# Patient Record
Sex: Female | Born: 1970
Health system: Southern US, Community
[De-identification: ages and names within clinical notes are randomized; demographics above are authoritative.]

## PROBLEM LIST (undated history)

## (undated) DIAGNOSIS — N200 Calculus of kidney: Secondary | ICD-10-CM

## (undated) DIAGNOSIS — E079 Disorder of thyroid, unspecified: Secondary | ICD-10-CM

## (undated) DIAGNOSIS — G4700 Insomnia, unspecified: Secondary | ICD-10-CM

## (undated) DIAGNOSIS — I10 Essential (primary) hypertension: Secondary | ICD-10-CM

## (undated) DIAGNOSIS — F419 Anxiety disorder, unspecified: Secondary | ICD-10-CM

## (undated) DIAGNOSIS — G43909 Migraine, unspecified, not intractable, without status migrainosus: Secondary | ICD-10-CM

## (undated) DIAGNOSIS — G40909 Epilepsy, unspecified, not intractable, without status epilepticus: Secondary | ICD-10-CM

## (undated) DIAGNOSIS — F418 Other specified anxiety disorders: Secondary | ICD-10-CM

## (undated) DIAGNOSIS — C9000 Multiple myeloma not having achieved remission: Secondary | ICD-10-CM

## (undated) DIAGNOSIS — N289 Disorder of kidney and ureter, unspecified: Secondary | ICD-10-CM

## (undated) DIAGNOSIS — M797 Fibromyalgia: Secondary | ICD-10-CM

## (undated) DIAGNOSIS — D75A Glucose-6-phosphate dehydrogenase (G6PD) deficiency without anemia: Secondary | ICD-10-CM

## (undated) DIAGNOSIS — K219 Gastro-esophageal reflux disease without esophagitis: Secondary | ICD-10-CM

## (undated) DIAGNOSIS — E785 Hyperlipidemia, unspecified: Secondary | ICD-10-CM

## (undated) DIAGNOSIS — R569 Unspecified convulsions: Secondary | ICD-10-CM

## (undated) DIAGNOSIS — L509 Urticaria, unspecified: Secondary | ICD-10-CM

## (undated) HISTORY — DX: Glucose-6-phosphate dehydrogenase (G6PD) deficiency without anemia: D75.A

## (undated) HISTORY — DX: Unspecified convulsions: R56.9

## (undated) HISTORY — DX: Other specified anxiety disorders: F41.8

## (undated) HISTORY — DX: Anxiety disorder, unspecified: F41.9

## (undated) HISTORY — DX: Migraine, unspecified, not intractable, without status migrainosus: G43.909

## (undated) HISTORY — PX: TOE SURGERY: SHX1073

## (undated) HISTORY — PX: APPENDECTOMY: SHX54

## (undated) HISTORY — DX: Urticaria, unspecified: L50.9

## (undated) HISTORY — DX: Gastro-esophageal reflux disease without esophagitis: K21.9

## (undated) HISTORY — DX: Fibromyalgia: M79.7

## (undated) HISTORY — DX: Disorder of kidney and ureter, unspecified: N28.9

## (undated) HISTORY — PX: TONSILLECTOMY AND ADENOIDECTOMY: SUR1326

## (undated) HISTORY — DX: Epilepsy, unspecified, not intractable, without status epilepticus: G40.909

## (undated) HISTORY — DX: Calculus of kidney: N20.0

## (undated) HISTORY — DX: Insomnia, unspecified: G47.00

## (undated) HISTORY — DX: Disorder of thyroid, unspecified: E07.9

## (undated) HISTORY — PX: CHOLECYSTECTOMY: SHX55

## (undated) HISTORY — DX: Essential (primary) hypertension: I10

## (undated) HISTORY — DX: Hyperlipidemia, unspecified: E78.5

## (undated) HISTORY — DX: Multiple myeloma not having achieved remission: C90.00

## (undated) HISTORY — PX: ABDOMINAL HYSTERECTOMY: SHX81

---

## 1999-02-26 ENCOUNTER — Inpatient Hospital Stay (HOSPITAL_COMMUNITY): Admission: AD | Admit: 1999-02-26 | Discharge: 1999-03-09 | Payer: Self-pay | Admitting: *Deleted

## 1999-03-03 ENCOUNTER — Encounter: Payer: Self-pay | Admitting: Family Medicine

## 1999-03-05 ENCOUNTER — Encounter: Payer: Self-pay | Admitting: *Deleted

## 2000-02-03 ENCOUNTER — Emergency Department (HOSPITAL_COMMUNITY): Admission: EM | Admit: 2000-02-03 | Discharge: 2000-02-03 | Payer: Self-pay | Admitting: *Deleted

## 2000-02-03 ENCOUNTER — Encounter: Payer: Self-pay | Admitting: *Deleted

## 2002-06-13 ENCOUNTER — Emergency Department (HOSPITAL_COMMUNITY): Admission: EM | Admit: 2002-06-13 | Discharge: 2002-06-13 | Payer: Self-pay

## 2002-06-14 ENCOUNTER — Ambulatory Visit (HOSPITAL_COMMUNITY): Admission: RE | Admit: 2002-06-14 | Discharge: 2002-06-14 | Payer: Self-pay | Admitting: *Deleted

## 2005-05-09 ENCOUNTER — Inpatient Hospital Stay (HOSPITAL_COMMUNITY): Admission: EM | Admit: 2005-05-09 | Discharge: 2005-05-13 | Payer: Self-pay | Admitting: Emergency Medicine

## 2005-05-09 ENCOUNTER — Encounter: Payer: Self-pay | Admitting: *Deleted

## 2005-05-09 IMAGING — CT CT ABDOMEN W/ CM
1 of 4 series · 13 of 32 positions shown, 19 images · IV contrast (omnipaque)
Comparison: none

CLINICAL DATA: Abdominal and pelvic pain.   Hysterectomy on [DATE].  Patient with history of appendectomy and cholecystectomy.
ABDOMEN CT WITH CONTRAST:
TECHNIQUE: Multidetector CT imaging of the abdomen was performed following the standard protocol during bolus administration of intravenous contrast.
Contrast:  100 cc Omnipaque 300.
No comparison films available.
TECHNIQUE: Multidetector CT imaging of the pelvis was performed following the standard protocol during bolus administration of intravenous contrast.

[Series 2: abd pelvis · axial · 0.71mm/px · z∈[-428,-28]mm · 13 of 90 slices shown, 19 images]
[im 6/90  soft-tissue]
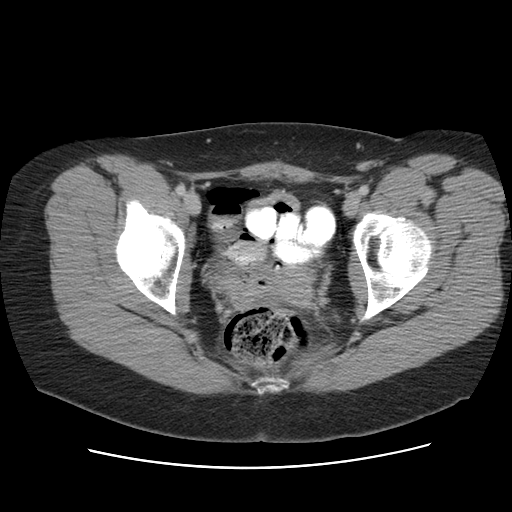
[im 6/90  bone]
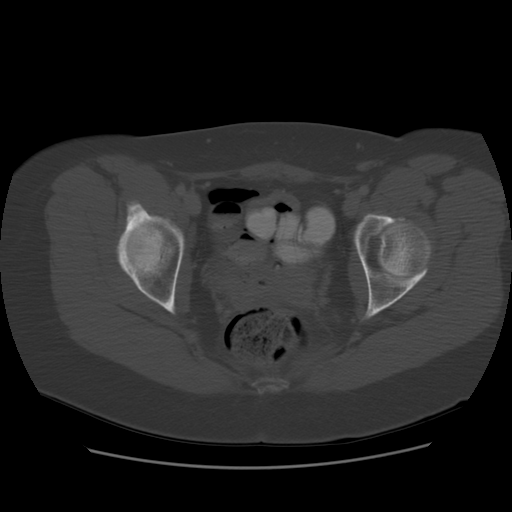
[im 12/90  soft-tissue]
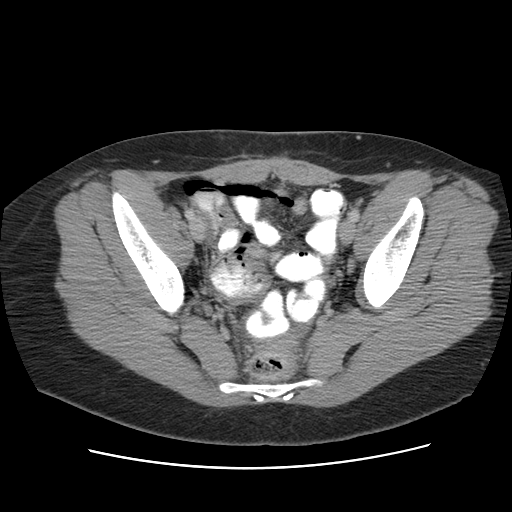
[im 18/90  soft-tissue]
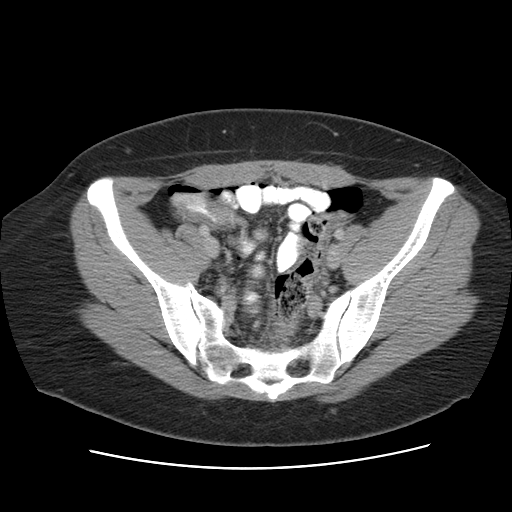
[im 24/90  soft-tissue]
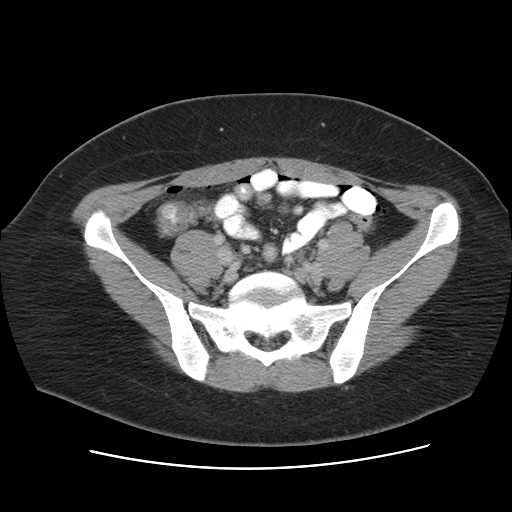
[im 30/90  soft-tissue]
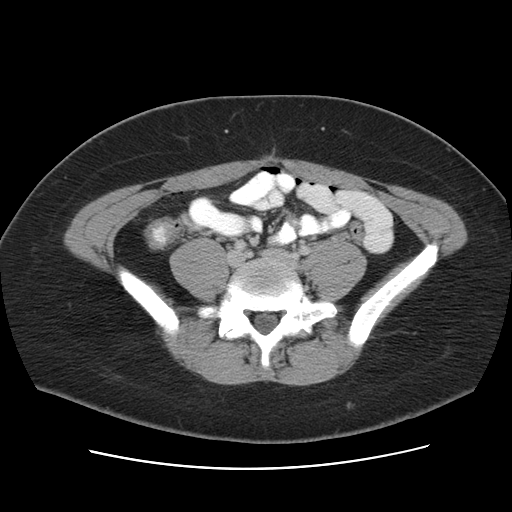
[im 36/90  soft-tissue]
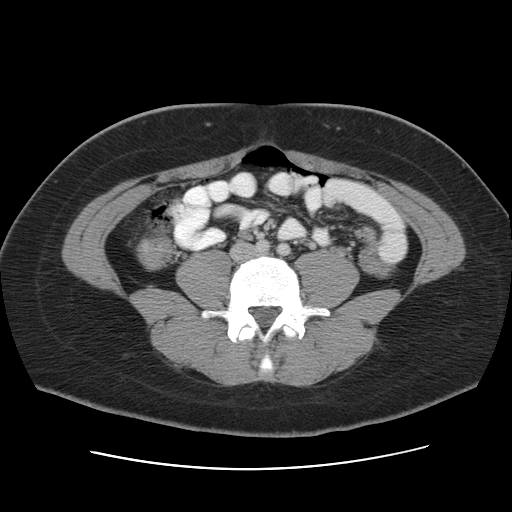
[im 48/90  soft-tissue]
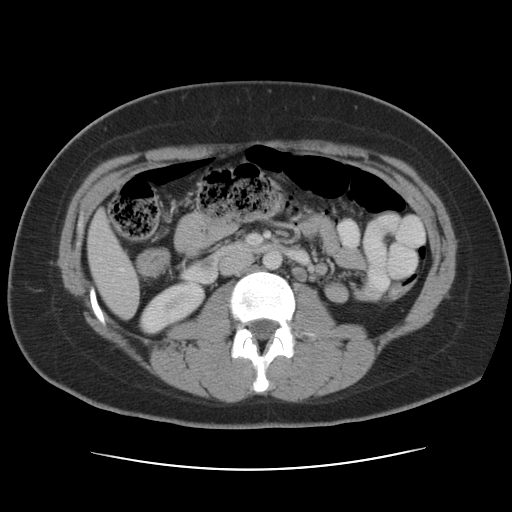
[im 54/90  soft-tissue]
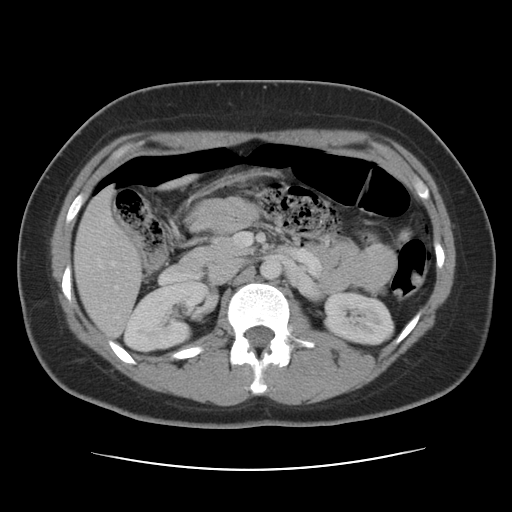
[im 60/90  soft-tissue]
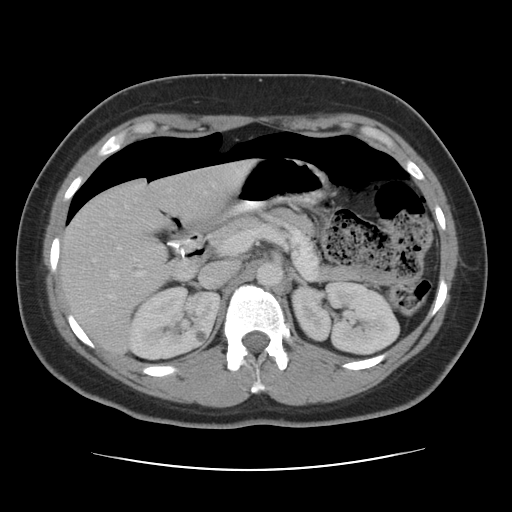
[im 60/90  bone]
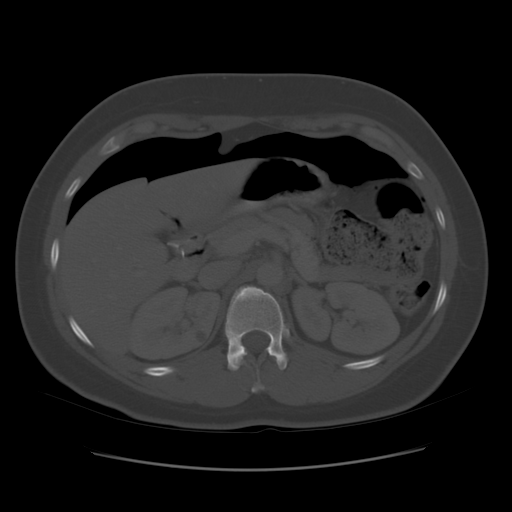
[im 66/90  soft-tissue]
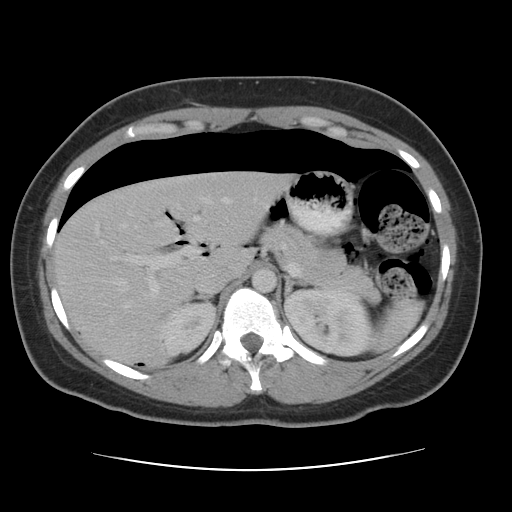
[im 66/90  lung]
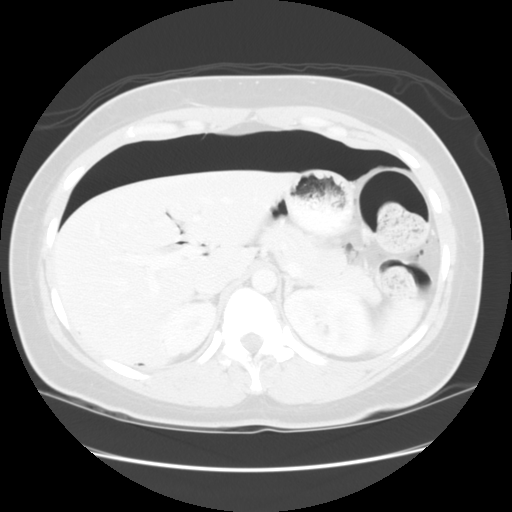
[im 72/90  soft-tissue]
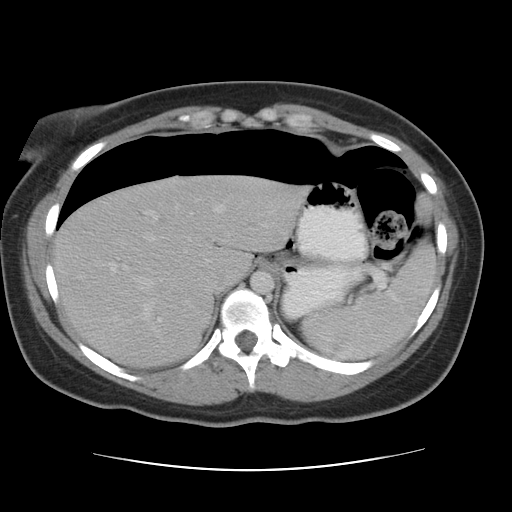
[im 72/90  lung]
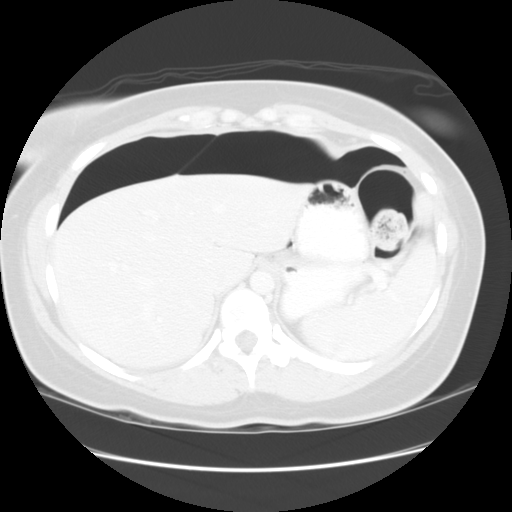
[im 78/90  soft-tissue]
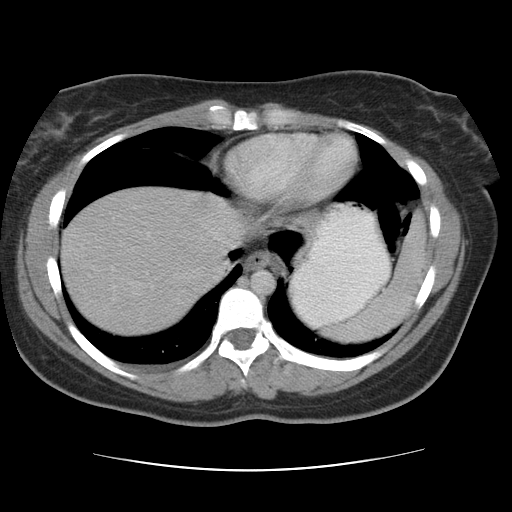
[im 78/90  lung]
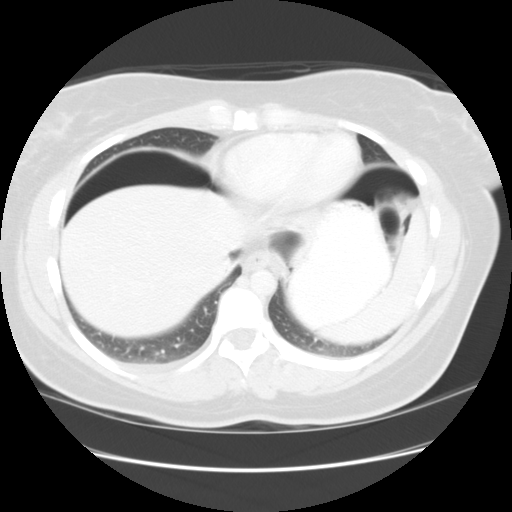
[im 84/90  soft-tissue]
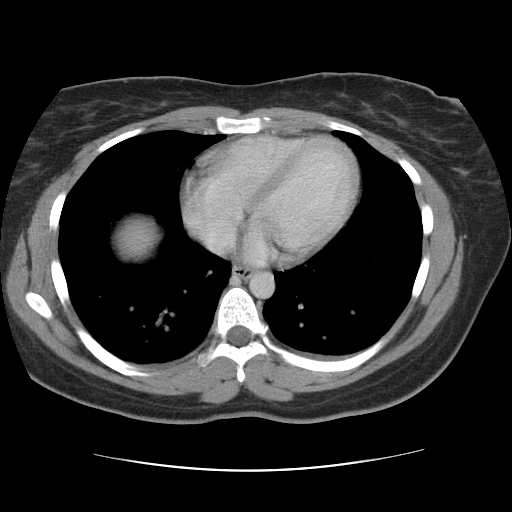
[im 84/90  lung]
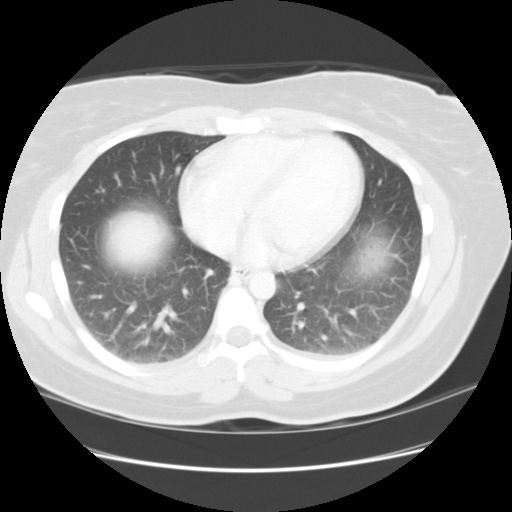

[13 of 32 positions shown; findings below may reference images not displayed]

FINDINGS: Tiny bilateral pleural effusions are noted.  A large amount of pneumoperitoneum is identified compatible with bowel perforation which is located in the low pelvis to be described.  The patient is status post hysterectomy.  The liver, pancreas, adrenal glands, kidneys, and spleen are unremarkable except for small bilateral renal cysts.  No evidence of enlarged lymph nodes or free fluid.  No biliary dilatation is identified.
IMPRESSION: Large amount of pneumoperitoneum compatible with bowel perforation which originates in the low right pelvis.
PELVIS CT WITH CONTRAST:
FINDINGS: Inflammatory changes and free air is compatible with bowel perforation which originate in the low right pelvis, probably from the cecum.  The patient is status post hysterectomy.  A small amount of free fluid is identified.
IMPRESSION: 1.  Pneumoperitoneum compatible with bowel perforation which originates in the low right pelvis - likely from the cecum.  
2.  Status post hysterectomy.
GASA, Nurse Practitioner, was notified of these results on [DATE] at 10 p.m.

## 2006-09-01 ENCOUNTER — Ambulatory Visit: Payer: Self-pay | Admitting: Internal Medicine

## 2006-09-03 ENCOUNTER — Ambulatory Visit: Payer: Self-pay | Admitting: Internal Medicine

## 2006-10-06 ENCOUNTER — Ambulatory Visit: Payer: Self-pay | Admitting: Internal Medicine

## 2006-12-15 ENCOUNTER — Ambulatory Visit: Payer: Self-pay | Admitting: *Deleted

## 2006-12-15 ENCOUNTER — Inpatient Hospital Stay (HOSPITAL_COMMUNITY): Admission: EM | Admit: 2006-12-15 | Discharge: 2006-12-16 | Payer: Self-pay | Admitting: Pediatrics

## 2006-12-16 ENCOUNTER — Encounter: Payer: Self-pay | Admitting: Cardiology

## 2007-01-13 ENCOUNTER — Ambulatory Visit: Payer: Self-pay | Admitting: Internal Medicine

## 2008-10-21 ENCOUNTER — Emergency Department (HOSPITAL_COMMUNITY): Admission: EM | Admit: 2008-10-21 | Discharge: 2008-10-21 | Payer: Self-pay | Admitting: Emergency Medicine

## 2008-10-21 IMAGING — CT CT HEAD W/O CM
1 series · 16 of 30 positions shown, 20 images · non-contrast
Comparison: None

CLINICAL DATA: Syncope.  Headache.

CT HEAD WITHOUT CONTRAST
TECHNIQUE: Contiguous axial images were obtained from the base of
the skull through the vertex without contrast.

[Series 2: head trauma 4.8 h37s · axial · 0.43mm/px · z∈[-102,+26]mm · 16 of 30 slices shown, 20 images]
[im 2/30  brain]
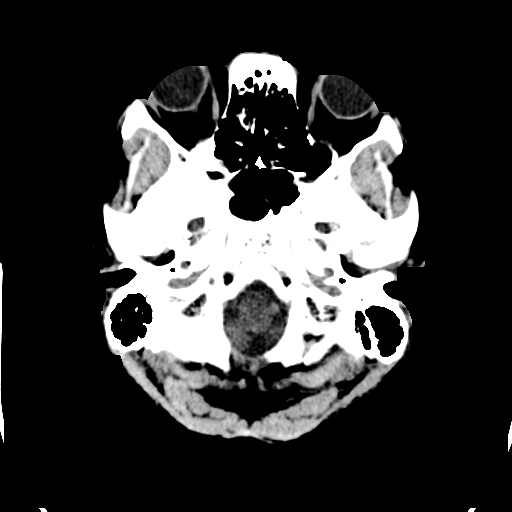
[im 2/30  bone]
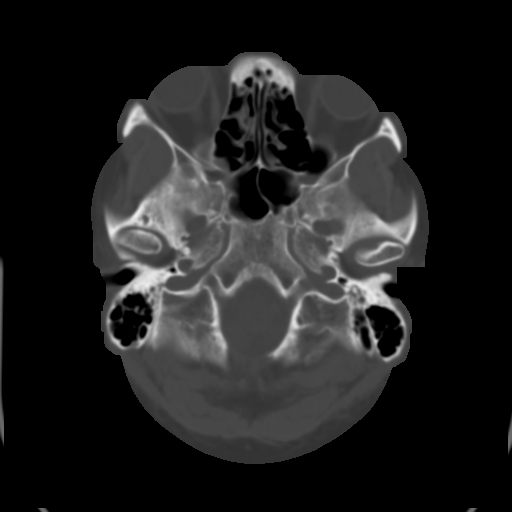
[im 4/30  brain]
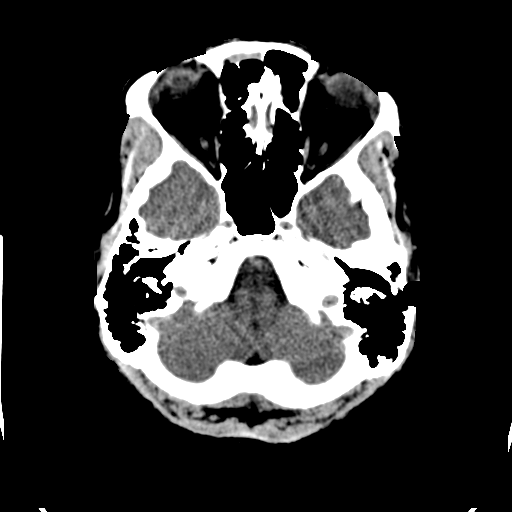
[im 6/30  brain]
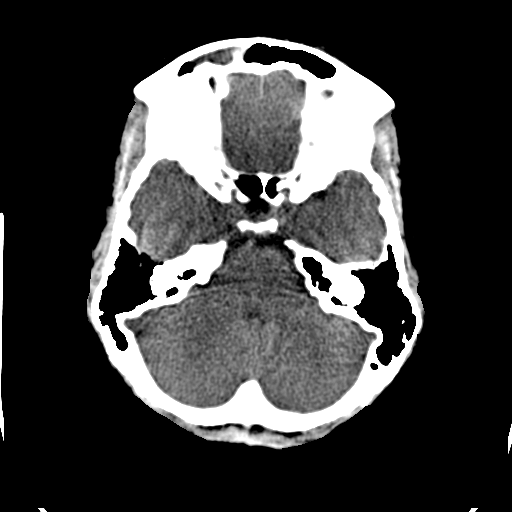
[im 8/30  brain]
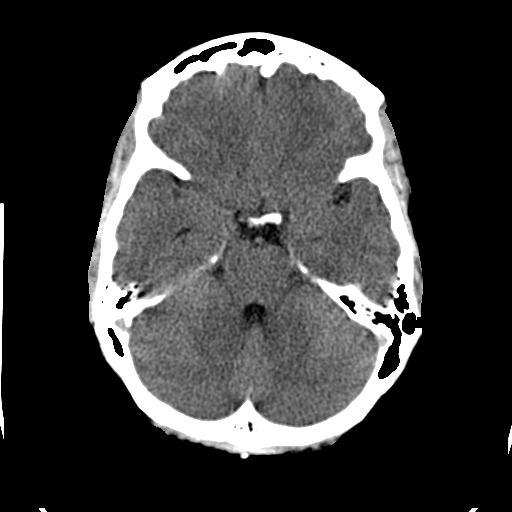
[im 9/30  brain]
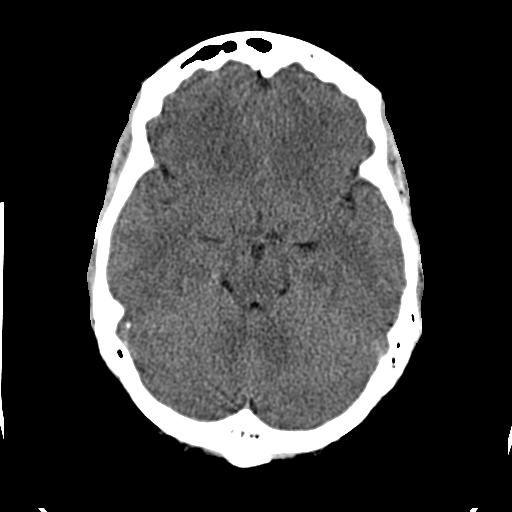
[im 9/30  bone]
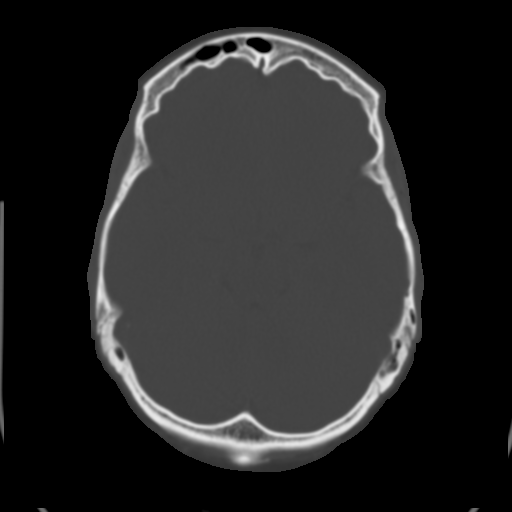
[im 11/30  brain]
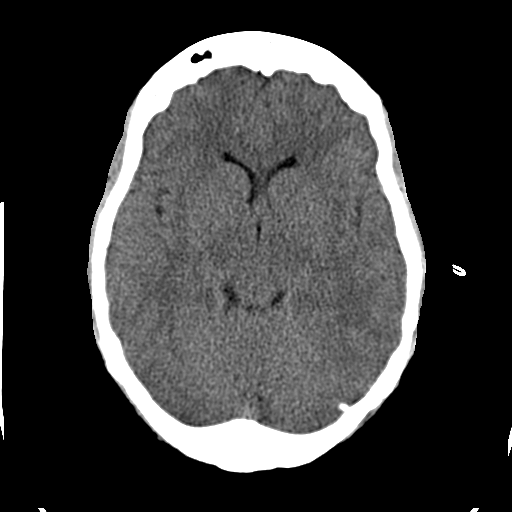
[im 13/30  brain]
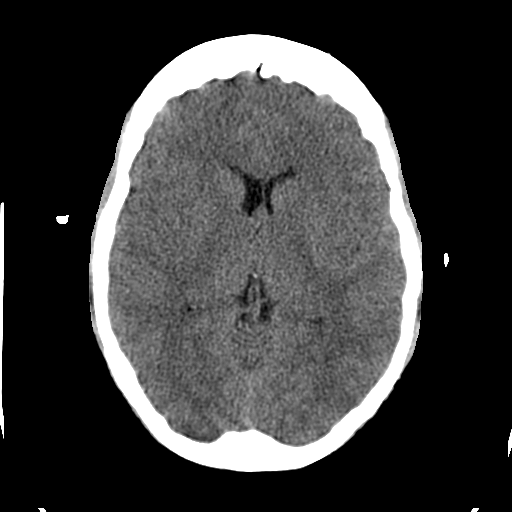
[im 15/30  brain]
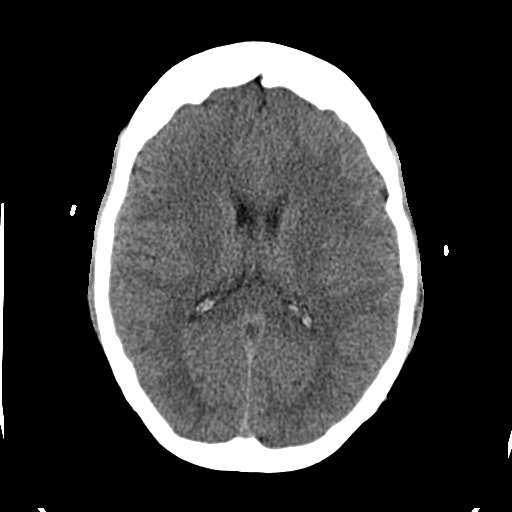
[im 16/30  brain]
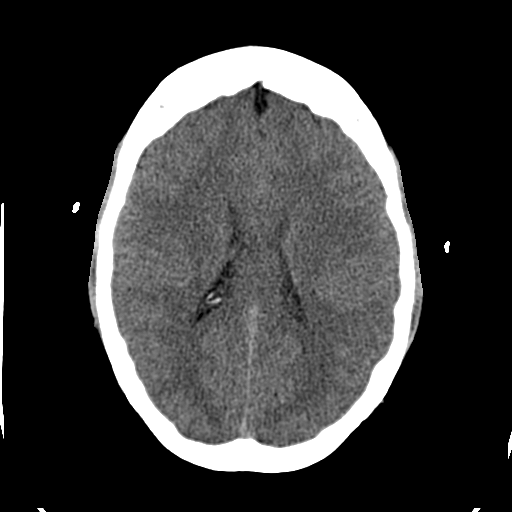
[im 16/30  bone]
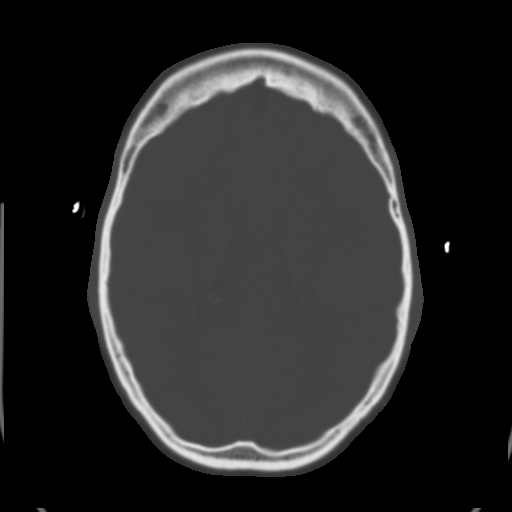
[im 18/30  brain]
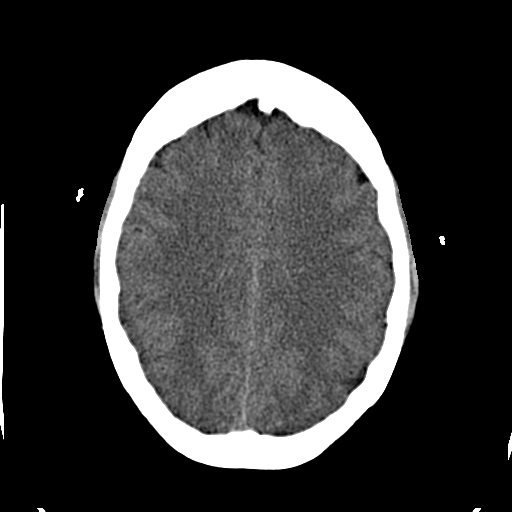
[im 20/30  brain]
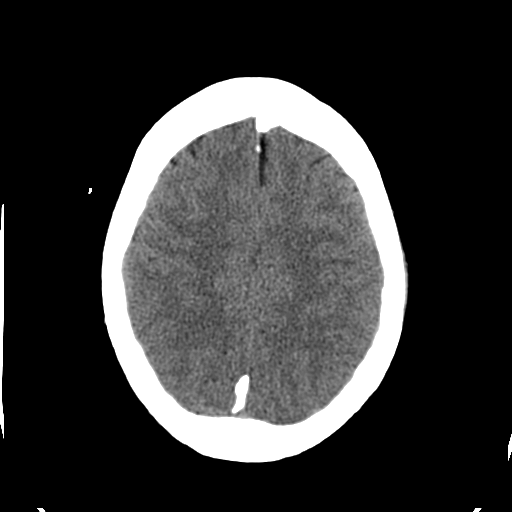
[im 22/30  brain]
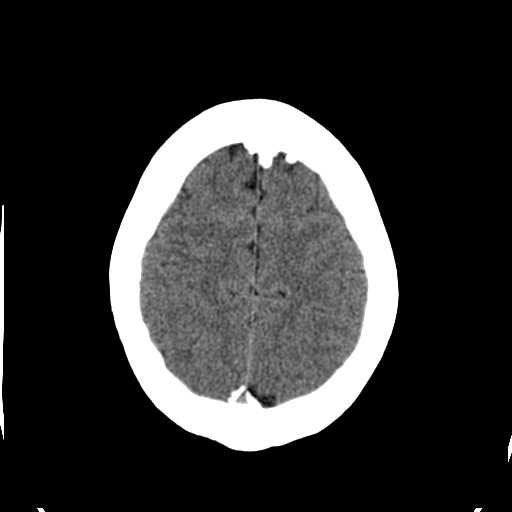
[im 23/30  brain]
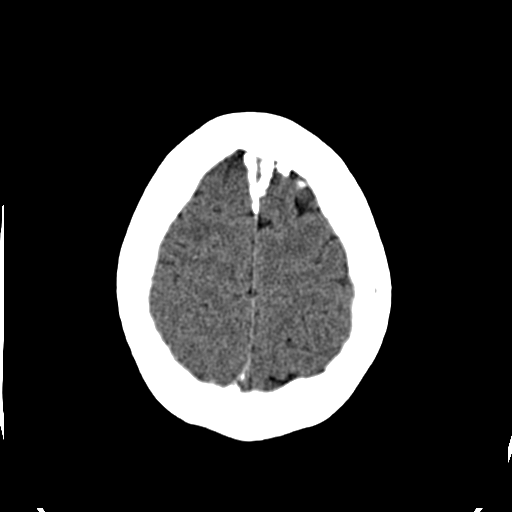
[im 23/30  bone]
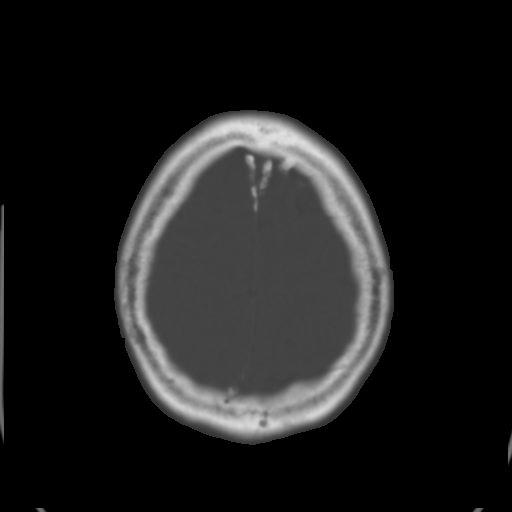
[im 25/30  brain]
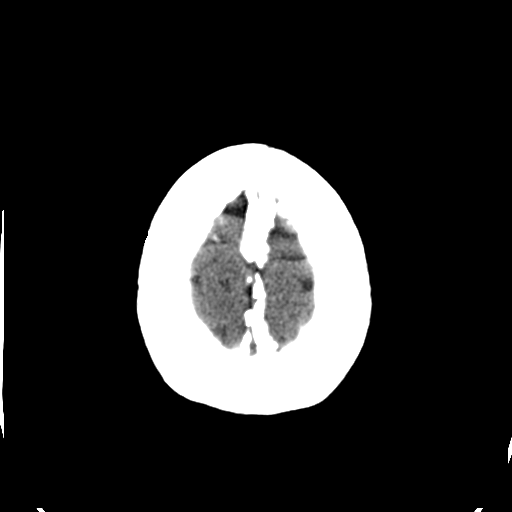
[im 27/30  brain]
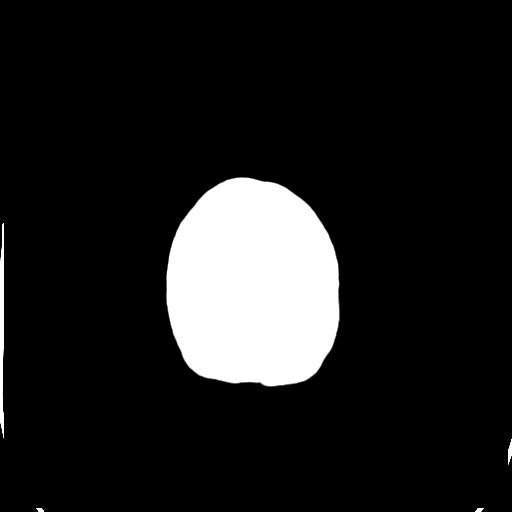
[im 29/30  brain]
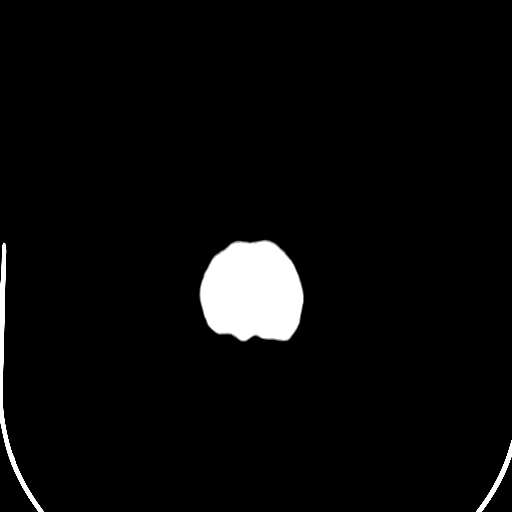

[16 of 30 positions shown; findings below may reference images not displayed]

FINDINGS: There is no evidence of acute intracranial hemorrhage,
mass lesion, brain edema or extra-axial fluid collection.  The
ventricles and subarachnoid spaces are appropriately sized for age.
There is no CT evidence of acute infarction.

There is mucosal thickening in the right frontal and ethmoid
sinuses bilaterally.  A probable chronic mucous retention cyst is
present in the right maxillary sinus.  The mastoids and middle ears
are clear.  The calvarium is intact.
IMPRESSION: 1.  No acute intracranial findings.
2.  Paranasal sinus disease as described.

## 2011-04-05 NOTE — Discharge Summary (Signed)
Kelly Haas, Kelly Haas NO.:  [id number]   MEDICAL RECORD NO.:  [id number]          PATIENT TYPE:  INP   LOCATION:  2027                         FACILITY:  [facility]   PHYSICIAN:  Duke Salvia, MD, FACCDATE OF BIRTH:  July 06, 1971   DATE OF ADMISSION:  12/15/2006  DATE OF DISCHARGE:  12/16/2006                               DISCHARGE SUMMARY   ALLERGIES:  IBUPROFEN.   PRINCIPAL DIAGNOSIS:  Neurally mediated syncope with gastrointestinal  trigger.   SECONDARY DIAGNOSES:  1. History of recurrent syncope, with positive tilt table, neurally      mediated.  2. Treated hypothyroidism.  3. Status post tubal ligation, hysterectomy,  4. Glaucoma.   PROCEDURE:  No procedures this admission.   BRIEF HISTORY:  Kelly Haas is a 40 year old with female who presented to  the emergency room by way of emergency medical services for syncope.  This is strongly believed be neurally mediated.   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  She had climbed  into her Kelly Haas to drive off when suddenly she felt sick to her stomach and  with crushing chest discomfort.  She got out of the Kelly Haas to throw up and  the next thing she remembers is personnel where she works assuring her  that emergency medical services were all their way.  On admission to the  emergency room, the patient had electrocardiogram which showed normal  sinus rhythm without ST elevations or ST changes, no Q-waves, no ST  depressions or elevations. Troponin I studies were negative.  The  patient has remained in sinus rhythm without any recurrence of either  nausea or syncope during this hospitalization.  She is seen by Dr.  Sherryl Manges the morning of December 16, 2006, with a diagnosis of  neurally mediated syncope with a GI trigger.   Patient discharging home on the following medications which are her  normal medications without  change.  1. Levothyroxine 75 mcg daily.  2. ProAmatine 5 mg t.i.d.  3. Alphagan ophthalmic solution 1 drop both eyes at bedtime.  4. Nexium 40 mg twice daily.  5. Enteric-coated aspirin 81 mg daily.  6. Multivitamin daily.  7. GlycoLax 17 grams in 8 ounces of water daily.  8. Toprol XL 100 mg daily.  9. Seroquel minimum dose as before this admission in the evening.  10.Calcium tab daily.   FOLLOW UP:  Dr. Graciela Husbands Tuesday January 13, 2007, at 10 o'clock in the  morning at [facility], [address].   Complete blood count this admission with hemoglobin 13.7, hematocrit  40.5, white cells 8.9, platelets 297.  Her creatinine is 1.  Protime  13.7, INR 1.  PTT is 34. Troponin I studies are 0.01, then 0.02. Cardiac  marker show troponin I less than 0.05.      [name], [place]      Duke Salvia, MD, [facility]  Electronically Signed    GM/MEDQ  D:  12/16/2006  T:  12/16/2006  Job:  [id number] 

## 2011-04-05 NOTE — Op Note (Signed)
Kelly Haas, KIRSTEN                ACCOUNT NO.:  [id number]   MEDICAL RECORD NO.:  [id number]          PATIENT TYPE:  INP   LOCATION:  0101                         FACILITY:  [facility]   PHYSICIAN:  Gerri Spore B. Earlene Plater, M.D.  DATE OF BIRTH:  02-16-1971   DATE OF PROCEDURE:  05/10/2005  DATE OF DISCHARGE:                                 OPERATIVE REPORT   REQUESTING PHYSICIAN:  Angelia Mould. Derrell Lolling, M.D.   INDICATIONS:  Post hysterectomy vaginal vault separation.   PROCEDURE:  Closure of vaginal cuff.   ANESTHESIA:  General.   FINDINGS:  Completely open vaginal cuff which was closed with interrupted #0  Vicryl.   INDICATIONS FOR PROCEDURE:  The patient presented to [facility] with  complaints of lower abdominal pain noted to have leukocytosis and no fever.  Assessment by the nurse practitioner at [facility] was that the  vaginal cuff was intact. Therefore a CT scan was requested which was  suggestive of possible bowel injury. Also pneumoperitoneum was noted.   Therefore the patient was taken to the operating room for exploratory  laparotomy and potential repair of bowel injury. At the time of surgery -see  Dr. Jacinto Halim note for details- there was extensive adhesions of cecum to the  vaginal cuff; however, no bowel injury. Once the cecum was dissected away,  it was clear that the vaginal cuff had dehisced. Therefore Dr. Derrell Lolling  requested I come to the operating room for the repair.   DESCRIPTION OF PROCEDURE:  The patient was in the operating room under  general anesthesia with the abdomen opened as outlined above. The cuff was  identified. There were angle sutures at each uterosacral ligament level of  the cuff. The bladder flap was taken down sharply. The cuff was closed with  interrupted figure-of-eight  sutures of #0 Vicryl. The pelvis was irrigated and no other abnormalities  noted. Interceed was laid over the cuff and the abdomen closed-see Dr.  Jacinto Halim note for those  details.   The patient was then taken to the recovery room awake, alert, and in stable  condition.  All counts were correct.       WBD/MEDQ  D:  05/10/2005  T:  05/10/2005  Job:  [id number]   cc:   Angelia Mould. Derrell Lolling, M.D.  [address]  [place]  [place] [address]

## 2011-04-05 NOTE — Assessment & Plan Note (Signed)
[facility]                                 ON-CALL NOTE   TANGY, DROZDOWSKI                         MRN:          [id number]  DATE:12/15/2006                            DOB:          1971-06-12    Ms. Connell called me today.  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  She did  not hurt herself.  She is 40 years of age and has a history of an  irregular heartbeat.  She was not sure what the diagnosis was per se.  She was on ProAmatine and Toprol as 2 cardiac medications.  She says her  blood pressure always runs low.   She did not hurt herself.  She is doing fine now.  She was alert and  oriented over the phone.   I told her this could have been a vasovagal event from getting sick on  her stomach; however, I could not say that with 100% certainty.  I  offered to see her here at the [facility] ER.  She said that she would  probably come in.     Thomas C. Daleen Squibb, MD, [facility]  Electronically Signed    TCW/MedQ  DD: 12/15/2006  DT: 12/15/2006  Job #: [id number]   cc:   Duke Salvia, MD, [facility]

## 2011-04-05 NOTE — H&P (Signed)
NAMESEHAR, Kelly Haas                ACCOUNT NO.:  [id number]   MEDICAL RECORD NO.:  [id number]          PATIENT TYPE:  INP   LOCATION:  0101                         FACILITY:  [facility]   PHYSICIAN:  Angelia Mould. Derrell Lolling, M.D.DATE OF BIRTH:  1971/04/29   DATE OF ADMISSION:  05/09/2005  DATE OF DISCHARGE:                                HISTORY & PHYSICAL   CHIEF COMPLAINT:  Abdominal pain.   HISTORY OF PRESENT ILLNESS:  This is a 40 year old white female who  developed sudden onset of diffuse abdominal pain and right shoulder pain at  3 p.m. today. She denies any trauma. She has never had anything similar to  this before. She has been nauseated but has not vomited. She had a feeling  of chills but has not had any documented fevers. Her last bowel movement was  two days ago and was normal. Her weight has been stable. She has no real  prodrome that she will admit to.   She went to the [facility] A CT scan there shows a large  pneumoperitoneum and suggestion of inflammatory changes in the right pelvis  suggesting that the focus of the perforation might be the terminal ileum of  the cecum. She was sent to [facility] for me to take care.   PAST MEDICAL HISTORY:  1.  Bilateral tubal ligation.  2.  Appendectomy and right salpingo-oophorectomy.  3.  Total abdominal hysterectomy and left salpingo-oophorectomy on February 26, 2005. This was done in [facility] and her previous surgeries were done      in [place].  4.  She has had a cholecystectomy in the past.  5.  She has had three pregnancies and three deliveries.  6.  She has had a pilonidal cyst.  7.  She states she has been evaluated by [facility] for hypotension.   CURRENT MEDICATIONS:  1.  Toprol dose unknown, one tablet daily.  2.  Climara patch.  3.  Multivitamins.  4.  Promaintan (?).   ALLERGIES:  IBUPROFEN causes stomach problems and a skin rash.   FAMILY HISTORY:  Mother living, has hypertension and  asthma. Father died in  a motor vehicle accident. One brother died in a motor vehicle accident. One  brother and one sister are living and well.   SOCIAL HISTORY:  She is single. She has three children. She lives in  [place]. She is a Consulting civil engineer at [employer]. She denies the use  of alcohol or tobacco.   REVIEW OF SYMPTOMS:  A 15-system review of systems is performed and is  noncontributory except as described above.   PHYSICAL EXAMINATION:  GENERAL:  Somewhat overweight young black female in  moderate distress. She is soft spoken. She does not appear toxic at all.  VITAL SIGNS:  Temperature 98.7, pulse 75, respirations 20, blood pressure  109/60.  HEENT:  Sclerae are clear. Extraocular movements intact. Ears, nose, mouth,  throat, nose, lips, tongue, and oropharynx are without gross lesions.  NECK:  Supple and nontender. No mass. No tenderness. No jugular venous  distention.  LUNGS:  Clear to auscultation. No chest wall tenderness.  BREASTS:  Not examined.  HEART:  Regular rate and rhythm. No murmur.  PULSES:  Radial, femoral and posterior tibial pulses are palpable. No  peripheral edema.  ABDOMEN:  Somewhat obese. Minimal bowel sounds. Not obviously distended.  Diffusely tender but more tender in the lower abdomen bilaterally than in  the upper abdomen. She guards some especially in the lower abdomen but her  abdomen is not rigid. She has multiple scars which appear to be well healed.  I do not feel any mass or hernia.  EXTREMITIES:  She moves all four extremities well without pain or deformity.  NEUROLOGICAL:  No gross motor or sensory deficits.   LABORATORY DATA:  White blood cell count 17,100, hemoglobin 13.6. Complete  metabolic panel basically normal. Urinalysis unremarkable.   CT scan showing pneumoperitoneum and inflammatory process in the right  pelvis as described above.   ASSESSMENT:  1.  Perforated viscus: Question whether this could be a cecal  diverticulum,      Meckel diverticulitis, or other more obscure process.  2.  Status post cholecystectomy.  3.  Status post appendectomy and hysterectomy.   PLAN:  1.  The patient will be taken to the operating room for exploratory      laparotomy, possible bowel resection, and possible diverting ostomy      depending on the findings.  2.  I have discussed the indication and details with the patient and her      female friend who was present throughout the encounter. Risks and      complications were outlined including but not limited to bleeding,      infection, injury to adjacent organs with major reconstructive surgery.      She is aware that she may require temporary colostomy. Cardiac,      pulmonary, and thromboembolic problems were also discussed. She seems to      understand these issues well. At this time, all of her questions were      answered. She is in full agreement with this plan.       HMI/MEDQ  D:  05/10/2005  T:  05/10/2005  Job:  [id number]

## 2011-04-05 NOTE — H&P (Signed)
NAMEAUJANAE, MCCULLUM NO.:  [id number]   MEDICAL RECORD NO.:  [id number]          PATIENT TYPE:  INP   LOCATION:  2027                         FACILITY:  [facility]   PHYSICIAN:  Duke Salvia, MD, FACCDATE OF BIRTH:  06-27-1971   DATE OF ADMISSION:  12/15/2006  DATE OF DISCHARGE:                              HISTORY & PHYSICAL   CHIEF COMPLAINT:  Syncope, chest pain.   HISTORY OF PRESENT ILLNESS:  Ahriyah Vannest is a 40 year old female with a  history of neurally mediated syncope demonstrated by tilt-table test, by  our records, who had an episode of syncope today.  Apparently, the  patient was behind the wheel of her car and was speaking calmly with a  co-worker when she had a sudden onset of chest pain which was crushing  in nature.  Following this episode she passed out.  The co-worker who  witnessed the incident was not available at the time.  She reports the  symptoms of chest pain were similar to prior episodes of syncope that  she has experienced with the exception of the chest pain even more  severe than usual.  Workup in the emergency room included an EKG,  cardiac markers and chemistries all of which were normal.  Currently,  the patient is completely asymptomatic and denies chest pain.   PAST MEDICAL HISTORY:  Thyroid disorder, prior seizure disorder,  recurrent episodes of syncope, bilateral tubal ligation.   SOCIAL HISTORY:  Lives locally with three children.  Works and goes to  school.  No tobacco, alcohol, or illicit drugs.   FAMILY HISTORY:  Mother is alive with hypertension.  Father died in a  motor vehicle accident.   REVIEW OF SYSTEMS:  Otherwise normal.   ALLERGIES:  IBUPROFEN.   MEDICATIONS:  1. ProAmatine 5 mg t.i.d.  2. Toprol-XL 100 mg daily.  3. Levothyroxine 75 mcg daily.  4. Seroquel p.r.n.  5. Nuva Ring.   PHYSICAL EXAMINATION:  VITAL SIGNS:  Temperature is 98.7.  Pulse is 84.  Respiratory rate is 18.  GENERAL:  She is a  pleasant woman in no apparent distress.  HEENT:  Normocephalic, atraumatic.  Pupils equal, round and reactive to  light.  Extraocular movements intact.  NECK:  Supple with no signs of adenopathy.  CARDIOVASCULAR:  Normal PMI.  Regular S1, S2, with no murmurs, rubs, or  gallops.  LUNGS:  Clear to auscultation with no wheezes, rales or rhonchi.  SKIN:  No rashes and no lesions.  ABDOMEN:  Soft, nontender.  Positive bowel sounds.  EXTREMITIES:  No cyanosis, clubbing, or edema.  MUSCULOSKELETAL:  No gross deformities.  NEUROLOGICAL:  Cranial nerves II-XII Intact.  Alert and oriented times  three.  Strength 5/5 in all extremities, __________ normal sensation  throughout.  Normal cerebellar function.   Chest x-ray is pending.  EKG shows sinus rhythm at a rate of 74 beats  per minute.  PR, QRS and QTc intervals are normal.   Chemistries unremarkable.   ASSESSMENT/PLAN:  Ms. Depace is a 40 year old woman with previously  described neurally mediated syncope.  She is  concerned because of the  chest pain associated with today's episode.  However, in the light of  completely normal EKG and negative cardiac markers.  It is unlikely that  there is a cardiac etiology of her chest pain.  Nonetheless, it is  likely prudent to keep her on telemetry over night for further  observation.  In reviewing records, I did not see an echocardiogram  performed recently.  We will do so to rule out structural heart disease  should this be apparent. Further testing may be necessary.      Daniel B. Haithcock, MD   Electronically Signed     ______________________________  Duke Salvia, MD, [facility]    DBH/MEDQ  D:  12/15/2006  T:  12/16/2006  Job:  [id number]

## 2011-04-05 NOTE — Op Note (Signed)
NAMEGIBSON, TELLERIA                ACCOUNT NO.:  [id number]   MEDICAL RECORD NO.:  [id number]          PATIENT TYPE:  INP   LOCATION:  0101                         FACILITY:  [facility]   PHYSICIAN:  Angelia Mould. Derrell Lolling, M.D.DATE OF BIRTH:  1971-07-09   DATE OF PROCEDURE:  05/10/2005  DATE OF DISCHARGE:                                 OPERATIVE REPORT   PREOPERATIVE DIAGNOSIS:  Pneumoperitoneum, rule out perforated viscus.   POSTOPERATIVE DIAGNOSIS:  Pneumoperitoneum secondary to dehiscence of  vaginal cuff.   OPERATION PERFORMED:  Exploratory laparotomy, lysis of adhesions and repair  of dehiscence of vaginal cuff.   COSURGEONS:  Angelia Mould. Derrell Lolling, M.D. and Gerri Spore B. Earlene Plater, M.D.   INDICATIONS FOR PROCEDURE:  This is a 40 year old black female who underwent  total abdominal hysterectomy two months ago in [facility]. In the afternoon  of June 22, she developed sudden onset of diffuse abdominal pain. This was  following sexual intercourse. She presented to the Maternity Admission Unit  at [facility] and was evaluated by the gynecologic service.  A CT scan  was obtained and showed a pneumoperitoneum and what was thought to be some  inflammatory changes in the right pelvis suggesting the possibility of a  perforated cecum. It was noted that she had previous had appendectomy. She  was transferred to [facility]. On evaluation here, she was found  to be afebrile with a normal pulse rate only in mild distress. Her abdomen  was tender especially in the lower abdomen but was not rigid. White blood  cell count was [id number]. I could not rule out GI perforation and so advised  laparotomy.   OPERATIVE FINDINGS:  The patient had a disruption of her vaginal cuff. It  was basically wide open. There were extensive adhesions in the pelvis which  had to be taken down to identify this, but these were most likely just  simply related to her previous surgery. There was no purulence. There was  no  evidence of any GI tract perforation. I examined the small bowel and the  colon thoroughly. There was no evidence of any primary inflammatory process  or bowel injury. The cecum was densely adherent to the left side of the open  vaginal cuff with very thick fibrotic scar, but once this was taken down it  was easy to tell that the cecum was fine. The terminal ileum looked normal.  There is no evidence of diverticula or Crohn disease. Clinically she did not  have peritonitis. There was no odor.   OPERATIVE TECHNIQUE:  Following the induction of general endotracheal  anesthesia, a Foley catheter was inserted. The patient was given intravenous  antibiotics. The abdomen was prepped and draped in a sterile fashion. A  lower midline laparotomy was performed. The abdomen was entered and explored  with findings as described above.   I had to spend about 30 minutes taking down all the adhesions, mobilizing  the right colon, the cecum and terminal ileum and sigmoid colon and rectum,  but once I did this, I found that there was no evidence of any  GI tract  injury and the vaginal cuff was wide open. There was no blood in the pelvis,  there was no blood in the vaginal cuff.   I called Dr. Marina Gravel who had previously seen the patient at [facility]. He came to the operating room and performed a very nice repair of  the vaginal cuff and placed Intercede on the repair to prevent adhesions. We  irrigated out the lower abdomen and pelvis with saline quite thoroughly. We  inspected the GI tract or more time, there was no evidence of any disease or  bleeding. The small bowel and colon and omentum were returned to their  anatomic positions. The midline fascia was closed with a running suture of  #1 PDS. The skin was closed with skin staples. Clean bandages were placed  and the patient taken to the recovery room in stable condition. Estimated  blood loss was about 50 mL. Complications none.  Sponge, needle and  instrument counts were correct.       HMI/MEDQ  D:  05/10/2005  T:  05/10/2005  Job:  [id number]   cc:   Gerri Spore B. Earlene Plater, M.D.  [address]  [place]  [place] [address]  Fax: [phone]

## 2011-04-05 NOTE — Letter (Signed)
September 01, 2006    Aundra Dubin. Revankar, M.D.  [address]  [place], [place] [address]   RE:  RETTIE, LAIRD  MRN:  [id number]  /  DOB:  08/24/1971   Dr. Glean Hess,   Is is a pleasure to see Yeni Jiggetts again at your request after a 4-year  hiatus.  She has a history of tachypalpitations that precedes syncope.  They  are accompanied by pallor, diaphoresis, nausea, and some residual fatigue.   She has both frequent episodes of syncope occurring about once every month  or two as well as much more frequent episodes of presyncope occurring every  week or two.  They are similar except for the degree of symptoms to which  they progress.  They typically have a duration of 5-15 minutes prior to  their ultimate conclusion or ultimate symptom.   She had a tilt-table test some years ago that you had done at [place] which  she describes as reproducing her symptoms exactly.   She has a history of shower intolerances, some orthostatic intolerance.  Many of her episodes occur while she is upright, though, occasionally they  have occurred while sitting both at her desk and while driving.  An event  recorder obtained some years ago demonstrated a tachycardia that had an  antecedent P wave; it was not clear whether this was an atrial tachycardia  or a reactive sinus tachycardia.  My impression was that she had neurally  mediated syncope with a tachycardia that may or may not have been secondary  to a primary.   Her salt intake is deplete, her fluid intake is also deplete.   PAST MEDICAL HISTORY:  In addition to the above is notable for:  1. Thyroid disease under the care of Dr. Chestine Spore.  2. Propensity toward fluid retention.  3. Peptic ulcer disease.  4. Fatigue.  5. Allergies.   PAST SURGICAL HISTORY:  Notable for:  1. Hysterectomy.  2. Cholecystectomy.  3. T&A.  4. Oophorectomy.  5. Cyst.  6. Appendectomy and some kind of intestine surgery.   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  She is divorced, she has three children, she is 5 feet 6 inches.  She does not use cigarettes, alcohol, or recreational drugs.   MEDICATIONS:  Currently include:  1. Levoxyl 75.  2. Promatene (question ProAmatine question dose).  3. Nexium 40 p.r.n.  4. Toprol 150.  5. Multivitamin.  6. Antibiotic.  7. Aspirin.   There is some question about whether she is allergic to ibuprofen and latex.   On examination, her blood pressure today was 124/86 with a pulse of 70.  Orthostatic were done with an increase in her heart rate from 71 to 86, but  he blood pressure was stable in the 112 up to 130 range.  The HEENT  examination demonstrated no icterus or xanthomata.  The neck veins were  flat.  The carotids are brisk and full bilaterally without bruits.  Back was  without kyphosis or scoliosis.  Lungs were clear.  Heart sounds were regular  without murmurs or gallops.  The abdomen was soft with active bowel sounds  without midline pulsation or hepatomegaly.  Femoral pulses were 2+, distal  pulses were intact.  There is no cyanosis, clubbing, or edema.  The  neurological examination was grossly normal.  The skin was warm and dry.   Electrocardiogram dated September 01, 2006, demonstrated sinus rhythm at 75  with intervals at 0.17/0.08/0.37.  The axis was 35 degrees.   IMPRESSION:  1. Recurrent syncope, neurally mediated.  2. Palpitations preceding #1, either reactive or function as a trigger, I      suspect the former.  3. Diet notable for being salt and fluid deplete.  4. Obesity.  5. Treated hypothyroidism.   Glean Hess, Ms. Runions has recurrent syncope that I suspect is neurally mediated  based on septic phenomena.  The palpitations can either be from the  sympathetic outburst which typically precedes neurally mediated syncope or  could potentially be the trigger for it; I suspect the former, but I think  event recording with a long prodrome would be  our best way of elucidating  this.   I will plan to see if she cannot get that arranged through your office.   I have also encouraged her to increase her salt and water intake.  She is  aversed to the former, but will do the latter, as the former is associated  with retention and swelling.   I have asked if she would mind coming back in about 5 weeks to sit down and  review the event recorder and she is willing to do that.   Thanks very much for asking [place] to see her.    Sincerely,     ______________________________  Duke Salvia, MD, [facility]    SCK/MedQ  /  Job #:  [id number]  DD:  09/01/2006 / DT:  09/02/2006   CC:   Marzella Schlein, M.D.

## 2011-04-05 NOTE — Letter (Signed)
October 06, 2006    Minette Brine, M.D.  [place]   RE:  KELLEY, POLINSKY  MRN:  [id number]  /  DOB:  1971/07/14   Dear Elby Showers :   I hope this letter finds you well and Happy Thanksgiving.   Naela Nodal comes in following event loop recording for trying to identify  triggers with the associated palpitations preceding her syncopal episodes  that you clarify by tilt table testing as being neurally mediated .  Unfortunately her event recorder demonstrated no arrhythmic trigger.   She has chronic nausea.  She is seeing Dr. Chales Abrahams for this.  I have asked her  to follow up with him to ask the questions whether there is some kind of GI  process, Sprue or some such thing that may be contributing to her chronic  nausea and may be forming a trigger for this.  In some situations __________  if there is a GI trigger.  The other issue I have raised with her is whether  a psychosocial trigger.  She certainly has a lot on her socially and this  may be contributing as well.  Dr. Judie Grieve  information would suggest maybe up  to a third of patients' would have this as a significant issue and that  without addressing it we are not going to make much headway.   Her medications are interesting in that she takes the Toprol for __________  hyperthyroidism and she takes Proamatine which she thinks is at 2.5 mg a  day, which she takes only once a  day.  There is no diurnal variation to her episodes and so I am not sure  that doing the Proamatine once a day makes a lot of sense, but I will defer  that to you.   If I can be of any further assistance, please do not hesitate to contact me.    Sincerely,      Duke Salvia, MD, [facility]  Electronically Signed    SCK/MedQ  DD: 10/06/2006  DT: 10/06/2006  Job #: [id number]

## 2011-04-05 NOTE — Discharge Summary (Signed)
NAMESHELLY, SPENSER                ACCOUNT NO.:  [id number]   MEDICAL RECORD NO.:  [id number]          PATIENT TYPE:  INP   LOCATION:  1619                         FACILITY:  [facility]   PHYSICIAN:  Angelia Mould. Derrell Lolling, M.D.DATE OF BIRTH:  1971/09/10   DATE OF ADMISSION:  05/09/2005  DATE OF DISCHARGE:  05/13/2005                                 DISCHARGE SUMMARY   FINAL DIAGNOSES:  1.  Pneumoperitoneum secondary to dehiscence of vaginal cuff.  2.  Status post total abdominal hysterectomy and left salpingo-oophorectomy      on February 26, 2005.  3.  Status post cholecystectomy   OPERATIONS PERFORMED:  Exploratory laparotomy, lysis of adhesions, and  repair of a disrupted vaginal cuff.  Date of surgery May 10, 2005.   HISTORY:  This is a 40 year old white female, who developed rather sudden  onset of diffuse abdominal pain and right shoulder pain at 3 o'clock p.m. on  the day of admission.  She apparently was having sexual intercourse a little  bit earlier but denies any other trauma.  She had been nauseated but not had  vomiting.  Her bowel movements were normal up to this point, and her weight  has been stable.  She went to the [facility] where a CT scan  showed a large pneumoperitoneum and suggestion of inflammatory changes in  the right pelvis suggesting that the focus of perforation might be in the  terminal ileum or the cecum.  She was evaluated there and then sent to  [facility] for me to see.   PHYSICAL EXAMINATION:  GENERAL:  Somewhat overweight white female in mild to  moderate distress.  She did not appear toxic.  VITAL SIGNS:  Pulse 75, temperature 97, respirations 20, blood pressure  109/60.   SIGNIFICANT PHYSICAL FINDINGS:  The abdomen was somewhat obese with minimal  bowel sounds, not obviously distended.  She was diffusely tender but more  tender in the lower abdomen bilaterally than in the upper abdomen.  She  guards some in the lower abdomen but  she is not rigid.  She has multiple  scars which appear to be well-healed.  I do not feel any masses or hernia.   ADMISSION DATA:  White blood cell count 17,100, hemoglobin 13.6, complete  metabolic panel and urinalysis normal.   HOSPITAL COURSE:  Following my evaluation, I felt that we were left with the  only good option being exploration because of the CT scan findings.  She was  taken to the operating room.  Findings were a lot of adhesions in the pelvis  but no perforation of the GI tract.  We did find complete disruption of the  vaginal cuff.  After taking down all of the adhesions, I asked Dr. Delia Heady  to come to the operating room, and he repaired the vaginal cuff in a very  nice fashion.   Postoperatively, the patient did well. She progressed in her diet and  activities over the next few days and was ready for discharge on May 13, 2005.  She advanced her diet, was  afebrile, and was ready to go home on June  26.  She was given discharge instruction sheets.  She was given a follow-up  appoint with me in 2-3 weeks.  She was also asked to follow up with Dr.  Okey Dupre, her GYN in [facility], in 1 month.       HMI/MEDQ  D:  05/25/2005  T:  05/25/2005  Job:  [id number]   cc:   Gerri Spore B. Earlene Plater, M.D.  [address]  [place]  [place] [address]  Fax: [phone]

## 2011-08-23 LAB — URINALYSIS, ROUTINE W REFLEX MICROSCOPIC
Bilirubin Urine: NEGATIVE
Nitrite: NEGATIVE
Specific Gravity, Urine: 1.014 (ref 1.005–1.030)
Urobilinogen, UA: 1 mg/dL (ref 0.0–1.0)

## 2011-08-23 LAB — POCT I-STAT, CHEM 8
Chloride: 106 mEq/L (ref 96–112)
Glucose, Bld: 112 mg/dL — ABNORMAL HIGH (ref 70–99)
HCT: 41 % (ref 36.0–46.0)
Hemoglobin: 13.9 g/dL (ref 12.0–15.0)
Potassium: 4.4 mEq/L (ref 3.5–5.1)
Sodium: 143 mEq/L (ref 135–145)

## 2011-08-23 LAB — VALPROIC ACID LEVEL: Valproic Acid Lvl: <10 ug/mL — ABNORMAL LOW (ref 50.0–100.0)

## 2011-11-23 DIAGNOSIS — N3000 Acute cystitis without hematuria: Secondary | ICD-10-CM | POA: Diagnosis not present

## 2011-11-24 DIAGNOSIS — A499 Bacterial infection, unspecified: Secondary | ICD-10-CM | POA: Diagnosis not present

## 2011-11-24 DIAGNOSIS — R109 Unspecified abdominal pain: Secondary | ICD-10-CM | POA: Diagnosis not present

## 2011-11-24 DIAGNOSIS — M545 Low back pain: Secondary | ICD-10-CM | POA: Diagnosis not present

## 2011-11-24 DIAGNOSIS — R300 Dysuria: Secondary | ICD-10-CM | POA: Diagnosis not present

## 2011-11-24 DIAGNOSIS — R350 Frequency of micturition: Secondary | ICD-10-CM | POA: Diagnosis not present

## 2011-11-24 DIAGNOSIS — N390 Urinary tract infection, site not specified: Secondary | ICD-10-CM | POA: Diagnosis not present

## 2011-11-24 DIAGNOSIS — M62838 Other muscle spasm: Secondary | ICD-10-CM | POA: Diagnosis not present

## 2011-12-08 DIAGNOSIS — J209 Acute bronchitis, unspecified: Secondary | ICD-10-CM | POA: Diagnosis not present

## 2011-12-11 DIAGNOSIS — F329 Major depressive disorder, single episode, unspecified: Secondary | ICD-10-CM | POA: Diagnosis not present

## 2011-12-11 DIAGNOSIS — R569 Unspecified convulsions: Secondary | ICD-10-CM | POA: Diagnosis not present

## 2011-12-11 DIAGNOSIS — E781 Pure hyperglyceridemia: Secondary | ICD-10-CM | POA: Diagnosis not present

## 2011-12-11 DIAGNOSIS — J209 Acute bronchitis, unspecified: Secondary | ICD-10-CM | POA: Diagnosis not present

## 2011-12-27 DIAGNOSIS — R928 Other abnormal and inconclusive findings on diagnostic imaging of breast: Secondary | ICD-10-CM | POA: Diagnosis not present

## 2011-12-27 DIAGNOSIS — N6019 Diffuse cystic mastopathy of unspecified breast: Secondary | ICD-10-CM | POA: Diagnosis not present

## 2012-02-08 DIAGNOSIS — R1084 Generalized abdominal pain: Secondary | ICD-10-CM | POA: Diagnosis not present

## 2012-02-10 DIAGNOSIS — E039 Hypothyroidism, unspecified: Secondary | ICD-10-CM | POA: Diagnosis not present

## 2012-02-10 DIAGNOSIS — R569 Unspecified convulsions: Secondary | ICD-10-CM | POA: Diagnosis not present

## 2012-02-10 DIAGNOSIS — E781 Pure hyperglyceridemia: Secondary | ICD-10-CM | POA: Diagnosis not present

## 2012-02-10 DIAGNOSIS — N760 Acute vaginitis: Secondary | ICD-10-CM | POA: Diagnosis not present

## 2012-02-24 DIAGNOSIS — Z01419 Encounter for gynecological examination (general) (routine) without abnormal findings: Secondary | ICD-10-CM | POA: Diagnosis not present

## 2012-02-24 DIAGNOSIS — R300 Dysuria: Secondary | ICD-10-CM | POA: Diagnosis not present

## 2012-02-24 DIAGNOSIS — B373 Candidiasis of vulva and vagina: Secondary | ICD-10-CM | POA: Diagnosis not present

## 2012-02-24 DIAGNOSIS — N760 Acute vaginitis: Secondary | ICD-10-CM | POA: Diagnosis not present

## 2012-02-24 DIAGNOSIS — Z124 Encounter for screening for malignant neoplasm of cervix: Secondary | ICD-10-CM | POA: Diagnosis not present

## 2012-03-03 DIAGNOSIS — R1032 Left lower quadrant pain: Secondary | ICD-10-CM | POA: Diagnosis not present

## 2012-03-03 DIAGNOSIS — K625 Hemorrhage of anus and rectum: Secondary | ICD-10-CM | POA: Diagnosis not present

## 2012-03-05 DIAGNOSIS — R569 Unspecified convulsions: Secondary | ICD-10-CM | POA: Diagnosis not present

## 2012-03-05 DIAGNOSIS — F329 Major depressive disorder, single episode, unspecified: Secondary | ICD-10-CM | POA: Diagnosis not present

## 2012-03-05 DIAGNOSIS — F411 Generalized anxiety disorder: Secondary | ICD-10-CM | POA: Diagnosis not present

## 2012-03-05 DIAGNOSIS — E781 Pure hyperglyceridemia: Secondary | ICD-10-CM | POA: Diagnosis not present

## 2012-04-02 DIAGNOSIS — F411 Generalized anxiety disorder: Secondary | ICD-10-CM | POA: Diagnosis not present

## 2012-04-02 DIAGNOSIS — R5383 Other fatigue: Secondary | ICD-10-CM | POA: Diagnosis not present

## 2012-04-02 DIAGNOSIS — E781 Pure hyperglyceridemia: Secondary | ICD-10-CM | POA: Diagnosis not present

## 2012-04-02 DIAGNOSIS — E039 Hypothyroidism, unspecified: Secondary | ICD-10-CM | POA: Diagnosis not present

## 2012-04-02 DIAGNOSIS — F329 Major depressive disorder, single episode, unspecified: Secondary | ICD-10-CM | POA: Diagnosis not present

## 2012-04-03 DIAGNOSIS — K648 Other hemorrhoids: Secondary | ICD-10-CM | POA: Diagnosis not present

## 2012-04-03 DIAGNOSIS — R109 Unspecified abdominal pain: Secondary | ICD-10-CM | POA: Diagnosis not present

## 2012-04-03 DIAGNOSIS — K921 Melena: Secondary | ICD-10-CM | POA: Diagnosis not present

## 2012-04-03 DIAGNOSIS — K625 Hemorrhage of anus and rectum: Secondary | ICD-10-CM | POA: Diagnosis not present

## 2012-04-03 DIAGNOSIS — R1032 Left lower quadrant pain: Secondary | ICD-10-CM | POA: Diagnosis not present

## 2012-04-13 DIAGNOSIS — R1310 Dysphagia, unspecified: Secondary | ICD-10-CM | POA: Diagnosis not present

## 2012-04-13 DIAGNOSIS — R0989 Other specified symptoms and signs involving the circulatory and respiratory systems: Secondary | ICD-10-CM | POA: Diagnosis not present

## 2012-04-13 DIAGNOSIS — T7840XA Allergy, unspecified, initial encounter: Secondary | ICD-10-CM | POA: Diagnosis not present

## 2012-04-13 DIAGNOSIS — I10 Essential (primary) hypertension: Secondary | ICD-10-CM | POA: Diagnosis not present

## 2012-04-13 DIAGNOSIS — R062 Wheezing: Secondary | ICD-10-CM | POA: Diagnosis not present

## 2012-04-13 DIAGNOSIS — J3089 Other allergic rhinitis: Secondary | ICD-10-CM | POA: Diagnosis not present

## 2012-04-13 DIAGNOSIS — R069 Unspecified abnormalities of breathing: Secondary | ICD-10-CM | POA: Diagnosis not present

## 2012-04-13 DIAGNOSIS — L259 Unspecified contact dermatitis, unspecified cause: Secondary | ICD-10-CM | POA: Diagnosis not present

## 2012-04-13 DIAGNOSIS — R0609 Other forms of dyspnea: Secondary | ICD-10-CM | POA: Diagnosis not present

## 2012-04-17 DIAGNOSIS — M159 Polyosteoarthritis, unspecified: Secondary | ICD-10-CM | POA: Diagnosis not present

## 2012-04-17 DIAGNOSIS — J029 Acute pharyngitis, unspecified: Secondary | ICD-10-CM | POA: Diagnosis not present

## 2012-04-17 DIAGNOSIS — E7800 Pure hypercholesterolemia, unspecified: Secondary | ICD-10-CM | POA: Diagnosis not present

## 2012-04-17 DIAGNOSIS — I10 Essential (primary) hypertension: Secondary | ICD-10-CM | POA: Diagnosis not present

## 2012-04-22 DIAGNOSIS — B373 Candidiasis of vulva and vagina: Secondary | ICD-10-CM | POA: Diagnosis not present

## 2012-04-22 DIAGNOSIS — R233 Spontaneous ecchymoses: Secondary | ICD-10-CM | POA: Diagnosis not present

## 2012-04-22 DIAGNOSIS — F329 Major depressive disorder, single episode, unspecified: Secondary | ICD-10-CM | POA: Diagnosis not present

## 2012-04-22 DIAGNOSIS — E781 Pure hyperglyceridemia: Secondary | ICD-10-CM | POA: Diagnosis not present

## 2012-05-06 DIAGNOSIS — R569 Unspecified convulsions: Secondary | ICD-10-CM | POA: Diagnosis not present

## 2012-05-06 DIAGNOSIS — R5381 Other malaise: Secondary | ICD-10-CM | POA: Diagnosis not present

## 2012-05-06 DIAGNOSIS — IMO0001 Reserved for inherently not codable concepts without codable children: Secondary | ICD-10-CM | POA: Diagnosis not present

## 2012-05-06 DIAGNOSIS — F411 Generalized anxiety disorder: Secondary | ICD-10-CM | POA: Diagnosis not present

## 2012-05-06 DIAGNOSIS — R5383 Other fatigue: Secondary | ICD-10-CM | POA: Diagnosis not present

## 2012-05-22 DIAGNOSIS — F329 Major depressive disorder, single episode, unspecified: Secondary | ICD-10-CM | POA: Diagnosis not present

## 2012-05-22 DIAGNOSIS — F411 Generalized anxiety disorder: Secondary | ICD-10-CM | POA: Diagnosis not present

## 2012-05-22 DIAGNOSIS — R569 Unspecified convulsions: Secondary | ICD-10-CM | POA: Diagnosis not present

## 2012-05-22 DIAGNOSIS — IMO0001 Reserved for inherently not codable concepts without codable children: Secondary | ICD-10-CM | POA: Diagnosis not present

## 2012-06-05 DIAGNOSIS — R569 Unspecified convulsions: Secondary | ICD-10-CM | POA: Diagnosis not present

## 2012-06-05 DIAGNOSIS — IMO0001 Reserved for inherently not codable concepts without codable children: Secondary | ICD-10-CM | POA: Diagnosis not present

## 2012-06-05 DIAGNOSIS — Z6837 Body mass index (BMI) 37.0-37.9, adult: Secondary | ICD-10-CM | POA: Diagnosis not present

## 2012-06-05 DIAGNOSIS — F329 Major depressive disorder, single episode, unspecified: Secondary | ICD-10-CM | POA: Diagnosis not present

## 2012-06-06 DIAGNOSIS — M549 Dorsalgia, unspecified: Secondary | ICD-10-CM | POA: Diagnosis not present

## 2012-06-06 DIAGNOSIS — R064 Hyperventilation: Secondary | ICD-10-CM | POA: Diagnosis not present

## 2012-06-06 DIAGNOSIS — M25559 Pain in unspecified hip: Secondary | ICD-10-CM | POA: Diagnosis not present

## 2012-06-06 DIAGNOSIS — I10 Essential (primary) hypertension: Secondary | ICD-10-CM | POA: Diagnosis not present

## 2012-06-06 DIAGNOSIS — S335XXA Sprain of ligaments of lumbar spine, initial encounter: Secondary | ICD-10-CM | POA: Diagnosis not present

## 2012-06-06 DIAGNOSIS — S239XXA Sprain of unspecified parts of thorax, initial encounter: Secondary | ICD-10-CM | POA: Diagnosis not present

## 2012-06-06 DIAGNOSIS — W108XXA Fall (on) (from) other stairs and steps, initial encounter: Secondary | ICD-10-CM | POA: Diagnosis not present

## 2012-06-08 DIAGNOSIS — F329 Major depressive disorder, single episode, unspecified: Secondary | ICD-10-CM | POA: Diagnosis not present

## 2012-06-08 DIAGNOSIS — M545 Low back pain: Secondary | ICD-10-CM | POA: Diagnosis not present

## 2012-06-08 DIAGNOSIS — R569 Unspecified convulsions: Secondary | ICD-10-CM | POA: Diagnosis not present

## 2012-06-08 DIAGNOSIS — IMO0001 Reserved for inherently not codable concepts without codable children: Secondary | ICD-10-CM | POA: Diagnosis not present

## 2012-06-09 DIAGNOSIS — M533 Sacrococcygeal disorders, not elsewhere classified: Secondary | ICD-10-CM | POA: Diagnosis not present

## 2012-06-30 DIAGNOSIS — Z1231 Encounter for screening mammogram for malignant neoplasm of breast: Secondary | ICD-10-CM | POA: Diagnosis not present

## 2012-07-07 DIAGNOSIS — F411 Generalized anxiety disorder: Secondary | ICD-10-CM | POA: Diagnosis not present

## 2012-07-07 DIAGNOSIS — F329 Major depressive disorder, single episode, unspecified: Secondary | ICD-10-CM | POA: Diagnosis not present

## 2012-07-07 DIAGNOSIS — R569 Unspecified convulsions: Secondary | ICD-10-CM | POA: Diagnosis not present

## 2012-07-07 DIAGNOSIS — IMO0001 Reserved for inherently not codable concepts without codable children: Secondary | ICD-10-CM | POA: Diagnosis not present

## 2012-07-17 DIAGNOSIS — R05 Cough: Secondary | ICD-10-CM | POA: Diagnosis not present

## 2012-07-17 DIAGNOSIS — J029 Acute pharyngitis, unspecified: Secondary | ICD-10-CM | POA: Diagnosis not present

## 2012-07-17 DIAGNOSIS — M542 Cervicalgia: Secondary | ICD-10-CM | POA: Diagnosis not present

## 2012-07-17 DIAGNOSIS — J705 Respiratory conditions due to smoke inhalation: Secondary | ICD-10-CM | POA: Diagnosis not present

## 2012-07-17 DIAGNOSIS — R112 Nausea with vomiting, unspecified: Secondary | ICD-10-CM | POA: Diagnosis not present

## 2012-07-17 DIAGNOSIS — R490 Dysphonia: Secondary | ICD-10-CM | POA: Diagnosis not present

## 2012-07-17 DIAGNOSIS — J383 Other diseases of vocal cords: Secondary | ICD-10-CM | POA: Diagnosis not present

## 2012-07-17 DIAGNOSIS — T50901A Poisoning by unspecified drugs, medicaments and biological substances, accidental (unintentional), initial encounter: Secondary | ICD-10-CM | POA: Diagnosis not present

## 2012-07-23 DIAGNOSIS — S1981XA Other specified injuries of larynx, initial encounter: Secondary | ICD-10-CM | POA: Insufficient documentation

## 2012-07-23 DIAGNOSIS — R490 Dysphonia: Secondary | ICD-10-CM | POA: Diagnosis not present

## 2012-07-23 DIAGNOSIS — S199XXA Unspecified injury of neck, initial encounter: Secondary | ICD-10-CM | POA: Diagnosis not present

## 2012-07-23 HISTORY — DX: Other specified injuries of larynx, initial encounter: S19.81XA

## 2012-08-06 DIAGNOSIS — Z6837 Body mass index (BMI) 37.0-37.9, adult: Secondary | ICD-10-CM | POA: Diagnosis not present

## 2012-08-06 DIAGNOSIS — F329 Major depressive disorder, single episode, unspecified: Secondary | ICD-10-CM | POA: Diagnosis not present

## 2012-08-06 DIAGNOSIS — R569 Unspecified convulsions: Secondary | ICD-10-CM | POA: Diagnosis not present

## 2012-08-06 DIAGNOSIS — IMO0001 Reserved for inherently not codable concepts without codable children: Secondary | ICD-10-CM | POA: Diagnosis not present

## 2012-09-03 DIAGNOSIS — R569 Unspecified convulsions: Secondary | ICD-10-CM | POA: Diagnosis not present

## 2012-09-03 DIAGNOSIS — IMO0001 Reserved for inherently not codable concepts without codable children: Secondary | ICD-10-CM | POA: Diagnosis not present

## 2012-09-03 DIAGNOSIS — Z6837 Body mass index (BMI) 37.0-37.9, adult: Secondary | ICD-10-CM | POA: Diagnosis not present

## 2012-09-03 DIAGNOSIS — M25559 Pain in unspecified hip: Secondary | ICD-10-CM | POA: Diagnosis not present

## 2012-09-03 DIAGNOSIS — E669 Obesity, unspecified: Secondary | ICD-10-CM | POA: Diagnosis not present

## 2012-09-03 DIAGNOSIS — F329 Major depressive disorder, single episode, unspecified: Secondary | ICD-10-CM | POA: Diagnosis not present

## 2012-09-18 DIAGNOSIS — R569 Unspecified convulsions: Secondary | ICD-10-CM | POA: Diagnosis not present

## 2012-09-18 DIAGNOSIS — Z6837 Body mass index (BMI) 37.0-37.9, adult: Secondary | ICD-10-CM | POA: Diagnosis not present

## 2012-09-18 DIAGNOSIS — IMO0001 Reserved for inherently not codable concepts without codable children: Secondary | ICD-10-CM | POA: Diagnosis not present

## 2012-09-18 DIAGNOSIS — N390 Urinary tract infection, site not specified: Secondary | ICD-10-CM | POA: Diagnosis not present

## 2012-09-18 DIAGNOSIS — Z23 Encounter for immunization: Secondary | ICD-10-CM | POA: Diagnosis not present

## 2012-10-12 DIAGNOSIS — IMO0001 Reserved for inherently not codable concepts without codable children: Secondary | ICD-10-CM | POA: Diagnosis not present

## 2012-10-12 DIAGNOSIS — F411 Generalized anxiety disorder: Secondary | ICD-10-CM | POA: Diagnosis not present

## 2012-10-12 DIAGNOSIS — F329 Major depressive disorder, single episode, unspecified: Secondary | ICD-10-CM | POA: Diagnosis not present

## 2012-10-12 DIAGNOSIS — R569 Unspecified convulsions: Secondary | ICD-10-CM | POA: Diagnosis not present

## 2012-10-28 DIAGNOSIS — E039 Hypothyroidism, unspecified: Secondary | ICD-10-CM | POA: Diagnosis not present

## 2012-10-28 DIAGNOSIS — E669 Obesity, unspecified: Secondary | ICD-10-CM | POA: Diagnosis not present

## 2012-10-28 DIAGNOSIS — R002 Palpitations: Secondary | ICD-10-CM | POA: Diagnosis not present

## 2012-12-22 DIAGNOSIS — M549 Dorsalgia, unspecified: Secondary | ICD-10-CM | POA: Diagnosis not present

## 2012-12-22 DIAGNOSIS — N3000 Acute cystitis without hematuria: Secondary | ICD-10-CM | POA: Diagnosis not present

## 2012-12-24 DIAGNOSIS — R569 Unspecified convulsions: Secondary | ICD-10-CM | POA: Diagnosis not present

## 2012-12-24 DIAGNOSIS — Z6838 Body mass index (BMI) 38.0-38.9, adult: Secondary | ICD-10-CM | POA: Diagnosis not present

## 2012-12-24 DIAGNOSIS — IMO0001 Reserved for inherently not codable concepts without codable children: Secondary | ICD-10-CM | POA: Diagnosis not present

## 2012-12-24 DIAGNOSIS — R109 Unspecified abdominal pain: Secondary | ICD-10-CM | POA: Diagnosis not present

## 2012-12-25 DIAGNOSIS — R109 Unspecified abdominal pain: Secondary | ICD-10-CM | POA: Diagnosis not present

## 2012-12-25 DIAGNOSIS — N200 Calculus of kidney: Secondary | ICD-10-CM | POA: Diagnosis not present

## 2013-01-26 DIAGNOSIS — G5620 Lesion of ulnar nerve, unspecified upper limb: Secondary | ICD-10-CM | POA: Diagnosis not present

## 2013-01-26 DIAGNOSIS — M79609 Pain in unspecified limb: Secondary | ICD-10-CM | POA: Diagnosis not present

## 2013-01-26 DIAGNOSIS — M7989 Other specified soft tissue disorders: Secondary | ICD-10-CM | POA: Diagnosis not present

## 2013-02-18 DIAGNOSIS — IMO0001 Reserved for inherently not codable concepts without codable children: Secondary | ICD-10-CM | POA: Diagnosis not present

## 2013-02-18 DIAGNOSIS — F329 Major depressive disorder, single episode, unspecified: Secondary | ICD-10-CM | POA: Diagnosis not present

## 2013-02-18 DIAGNOSIS — Z6838 Body mass index (BMI) 38.0-38.9, adult: Secondary | ICD-10-CM | POA: Diagnosis not present

## 2013-02-18 DIAGNOSIS — M159 Polyosteoarthritis, unspecified: Secondary | ICD-10-CM | POA: Diagnosis not present

## 2013-03-04 DIAGNOSIS — R351 Nocturia: Secondary | ICD-10-CM | POA: Diagnosis not present

## 2013-03-04 DIAGNOSIS — N3289 Other specified disorders of bladder: Secondary | ICD-10-CM | POA: Diagnosis not present

## 2013-03-10 DIAGNOSIS — G43909 Migraine, unspecified, not intractable, without status migrainosus: Secondary | ICD-10-CM | POA: Diagnosis not present

## 2013-03-10 DIAGNOSIS — G40309 Generalized idiopathic epilepsy and epileptic syndromes, not intractable, without status epilepticus: Secondary | ICD-10-CM | POA: Diagnosis not present

## 2013-03-12 DIAGNOSIS — G894 Chronic pain syndrome: Secondary | ICD-10-CM

## 2013-03-12 DIAGNOSIS — M1612 Unilateral primary osteoarthritis, left hip: Secondary | ICD-10-CM

## 2013-03-12 DIAGNOSIS — Z79891 Long term (current) use of opiate analgesic: Secondary | ICD-10-CM

## 2013-03-12 DIAGNOSIS — M533 Sacrococcygeal disorders, not elsewhere classified: Secondary | ICD-10-CM

## 2013-03-12 DIAGNOSIS — Z5181 Encounter for therapeutic drug level monitoring: Secondary | ICD-10-CM | POA: Diagnosis not present

## 2013-03-12 DIAGNOSIS — M169 Osteoarthritis of hip, unspecified: Secondary | ICD-10-CM | POA: Diagnosis not present

## 2013-03-12 DIAGNOSIS — M47814 Spondylosis without myelopathy or radiculopathy, thoracic region: Secondary | ICD-10-CM

## 2013-03-12 DIAGNOSIS — Z79899 Other long term (current) drug therapy: Secondary | ICD-10-CM | POA: Diagnosis not present

## 2013-03-12 HISTORY — DX: Chronic pain syndrome: G89.4

## 2013-03-12 HISTORY — DX: Sacrococcygeal disorders, not elsewhere classified: M53.3

## 2013-03-12 HISTORY — DX: Long term (current) use of opiate analgesic: Z79.891

## 2013-03-12 HISTORY — DX: Unilateral primary osteoarthritis, left hip: M16.12

## 2013-03-12 HISTORY — DX: Spondylosis without myelopathy or radiculopathy, thoracic region: M47.814

## 2013-03-15 DIAGNOSIS — M25559 Pain in unspecified hip: Secondary | ICD-10-CM | POA: Diagnosis not present

## 2013-03-15 DIAGNOSIS — M47817 Spondylosis without myelopathy or radiculopathy, lumbosacral region: Secondary | ICD-10-CM | POA: Diagnosis not present

## 2013-03-15 DIAGNOSIS — M533 Sacrococcygeal disorders, not elsewhere classified: Secondary | ICD-10-CM | POA: Diagnosis not present

## 2013-04-01 DIAGNOSIS — N3289 Other specified disorders of bladder: Secondary | ICD-10-CM | POA: Diagnosis not present

## 2013-04-01 DIAGNOSIS — R351 Nocturia: Secondary | ICD-10-CM | POA: Diagnosis not present

## 2013-04-13 DIAGNOSIS — S6390XA Sprain of unspecified part of unspecified wrist and hand, initial encounter: Secondary | ICD-10-CM | POA: Diagnosis not present

## 2013-04-13 DIAGNOSIS — T754XXA Electrocution, initial encounter: Secondary | ICD-10-CM | POA: Diagnosis not present

## 2013-04-13 DIAGNOSIS — W868XXA Exposure to other electric current, initial encounter: Secondary | ICD-10-CM | POA: Diagnosis not present

## 2013-04-15 DIAGNOSIS — M76899 Other specified enthesopathies of unspecified lower limb, excluding foot: Secondary | ICD-10-CM | POA: Diagnosis not present

## 2013-04-15 DIAGNOSIS — M533 Sacrococcygeal disorders, not elsewhere classified: Secondary | ICD-10-CM | POA: Diagnosis not present

## 2013-04-15 DIAGNOSIS — G894 Chronic pain syndrome: Secondary | ICD-10-CM | POA: Diagnosis not present

## 2013-04-15 DIAGNOSIS — M7060 Trochanteric bursitis, unspecified hip: Secondary | ICD-10-CM

## 2013-04-15 HISTORY — DX: Trochanteric bursitis, unspecified hip: M70.60

## 2013-04-22 DIAGNOSIS — E039 Hypothyroidism, unspecified: Secondary | ICD-10-CM | POA: Diagnosis not present

## 2013-04-22 DIAGNOSIS — IMO0001 Reserved for inherently not codable concepts without codable children: Secondary | ICD-10-CM | POA: Diagnosis not present

## 2013-04-22 DIAGNOSIS — M159 Polyosteoarthritis, unspecified: Secondary | ICD-10-CM | POA: Diagnosis not present

## 2013-04-22 DIAGNOSIS — F329 Major depressive disorder, single episode, unspecified: Secondary | ICD-10-CM | POA: Diagnosis not present

## 2013-04-22 DIAGNOSIS — Z6838 Body mass index (BMI) 38.0-38.9, adult: Secondary | ICD-10-CM | POA: Diagnosis not present

## 2013-05-03 DIAGNOSIS — R351 Nocturia: Secondary | ICD-10-CM | POA: Diagnosis not present

## 2013-05-03 DIAGNOSIS — N3289 Other specified disorders of bladder: Secondary | ICD-10-CM | POA: Diagnosis not present

## 2013-05-05 DIAGNOSIS — M76899 Other specified enthesopathies of unspecified lower limb, excluding foot: Secondary | ICD-10-CM | POA: Diagnosis not present

## 2013-05-05 DIAGNOSIS — M533 Sacrococcygeal disorders, not elsewhere classified: Secondary | ICD-10-CM | POA: Diagnosis not present

## 2013-05-17 DIAGNOSIS — G894 Chronic pain syndrome: Secondary | ICD-10-CM | POA: Diagnosis not present

## 2013-05-17 DIAGNOSIS — M169 Osteoarthritis of hip, unspecified: Secondary | ICD-10-CM | POA: Diagnosis not present

## 2013-05-17 DIAGNOSIS — M76899 Other specified enthesopathies of unspecified lower limb, excluding foot: Secondary | ICD-10-CM | POA: Diagnosis not present

## 2013-05-17 DIAGNOSIS — M533 Sacrococcygeal disorders, not elsewhere classified: Secondary | ICD-10-CM | POA: Diagnosis not present

## 2013-06-07 DIAGNOSIS — M47817 Spondylosis without myelopathy or radiculopathy, lumbosacral region: Secondary | ICD-10-CM | POA: Diagnosis not present

## 2013-06-07 DIAGNOSIS — M76899 Other specified enthesopathies of unspecified lower limb, excluding foot: Secondary | ICD-10-CM | POA: Diagnosis not present

## 2013-06-07 DIAGNOSIS — M533 Sacrococcygeal disorders, not elsewhere classified: Secondary | ICD-10-CM | POA: Diagnosis not present

## 2013-06-07 DIAGNOSIS — G894 Chronic pain syndrome: Secondary | ICD-10-CM | POA: Diagnosis not present

## 2013-06-22 DIAGNOSIS — K219 Gastro-esophageal reflux disease without esophagitis: Secondary | ICD-10-CM | POA: Diagnosis not present

## 2013-06-22 DIAGNOSIS — G4700 Insomnia, unspecified: Secondary | ICD-10-CM | POA: Diagnosis not present

## 2013-06-22 DIAGNOSIS — Z6831 Body mass index (BMI) 31.0-31.9, adult: Secondary | ICD-10-CM | POA: Diagnosis not present

## 2013-06-22 DIAGNOSIS — F329 Major depressive disorder, single episode, unspecified: Secondary | ICD-10-CM | POA: Diagnosis not present

## 2013-07-01 DIAGNOSIS — Z1231 Encounter for screening mammogram for malignant neoplasm of breast: Secondary | ICD-10-CM | POA: Diagnosis not present

## 2013-07-07 DIAGNOSIS — N63 Unspecified lump in unspecified breast: Secondary | ICD-10-CM | POA: Diagnosis not present

## 2013-07-12 DIAGNOSIS — IMO0002 Reserved for concepts with insufficient information to code with codable children: Secondary | ICD-10-CM | POA: Diagnosis not present

## 2013-07-12 DIAGNOSIS — G894 Chronic pain syndrome: Secondary | ICD-10-CM | POA: Diagnosis not present

## 2013-07-12 DIAGNOSIS — IMO0001 Reserved for inherently not codable concepts without codable children: Secondary | ICD-10-CM | POA: Diagnosis not present

## 2013-07-12 DIAGNOSIS — M533 Sacrococcygeal disorders, not elsewhere classified: Secondary | ICD-10-CM | POA: Diagnosis not present

## 2013-08-09 DIAGNOSIS — IMO0002 Reserved for concepts with insufficient information to code with codable children: Secondary | ICD-10-CM | POA: Diagnosis not present

## 2013-08-09 DIAGNOSIS — G894 Chronic pain syndrome: Secondary | ICD-10-CM | POA: Diagnosis not present

## 2013-08-09 DIAGNOSIS — IMO0001 Reserved for inherently not codable concepts without codable children: Secondary | ICD-10-CM | POA: Diagnosis not present

## 2013-08-09 DIAGNOSIS — M533 Sacrococcygeal disorders, not elsewhere classified: Secondary | ICD-10-CM | POA: Diagnosis not present

## 2013-08-28 DIAGNOSIS — S8990XA Unspecified injury of unspecified lower leg, initial encounter: Secondary | ICD-10-CM | POA: Diagnosis not present

## 2013-09-07 DIAGNOSIS — IMO0002 Reserved for concepts with insufficient information to code with codable children: Secondary | ICD-10-CM | POA: Diagnosis not present

## 2013-09-07 DIAGNOSIS — IMO0001 Reserved for inherently not codable concepts without codable children: Secondary | ICD-10-CM | POA: Diagnosis not present

## 2013-09-07 DIAGNOSIS — Z5181 Encounter for therapeutic drug level monitoring: Secondary | ICD-10-CM | POA: Diagnosis not present

## 2013-09-07 DIAGNOSIS — Z79899 Other long term (current) drug therapy: Secondary | ICD-10-CM | POA: Diagnosis not present

## 2013-09-07 DIAGNOSIS — M533 Sacrococcygeal disorders, not elsewhere classified: Secondary | ICD-10-CM | POA: Diagnosis not present

## 2013-09-07 DIAGNOSIS — G894 Chronic pain syndrome: Secondary | ICD-10-CM | POA: Diagnosis not present

## 2013-09-22 DIAGNOSIS — G4700 Insomnia, unspecified: Secondary | ICD-10-CM | POA: Diagnosis not present

## 2013-09-22 DIAGNOSIS — K219 Gastro-esophageal reflux disease without esophagitis: Secondary | ICD-10-CM | POA: Diagnosis not present

## 2013-09-22 DIAGNOSIS — F329 Major depressive disorder, single episode, unspecified: Secondary | ICD-10-CM | POA: Diagnosis not present

## 2013-09-22 DIAGNOSIS — E039 Hypothyroidism, unspecified: Secondary | ICD-10-CM | POA: Diagnosis not present

## 2013-09-22 DIAGNOSIS — Z6837 Body mass index (BMI) 37.0-37.9, adult: Secondary | ICD-10-CM | POA: Diagnosis not present

## 2013-09-22 DIAGNOSIS — Z23 Encounter for immunization: Secondary | ICD-10-CM | POA: Diagnosis not present

## 2013-10-04 DIAGNOSIS — K120 Recurrent oral aphthae: Secondary | ICD-10-CM | POA: Diagnosis not present

## 2013-10-04 DIAGNOSIS — G4700 Insomnia, unspecified: Secondary | ICD-10-CM | POA: Diagnosis not present

## 2013-10-04 DIAGNOSIS — Z6837 Body mass index (BMI) 37.0-37.9, adult: Secondary | ICD-10-CM | POA: Diagnosis not present

## 2013-10-04 DIAGNOSIS — F329 Major depressive disorder, single episode, unspecified: Secondary | ICD-10-CM | POA: Diagnosis not present

## 2013-10-05 DIAGNOSIS — M533 Sacrococcygeal disorders, not elsewhere classified: Secondary | ICD-10-CM | POA: Diagnosis not present

## 2013-10-05 DIAGNOSIS — IMO0002 Reserved for concepts with insufficient information to code with codable children: Secondary | ICD-10-CM | POA: Diagnosis not present

## 2013-10-05 DIAGNOSIS — IMO0001 Reserved for inherently not codable concepts without codable children: Secondary | ICD-10-CM | POA: Diagnosis not present

## 2013-10-05 DIAGNOSIS — G894 Chronic pain syndrome: Secondary | ICD-10-CM | POA: Diagnosis not present

## 2013-11-03 DIAGNOSIS — N200 Calculus of kidney: Secondary | ICD-10-CM | POA: Diagnosis not present

## 2013-11-03 DIAGNOSIS — N3289 Other specified disorders of bladder: Secondary | ICD-10-CM | POA: Diagnosis not present

## 2013-11-04 DIAGNOSIS — M79609 Pain in unspecified limb: Secondary | ICD-10-CM | POA: Diagnosis not present

## 2013-11-04 DIAGNOSIS — R209 Unspecified disturbances of skin sensation: Secondary | ICD-10-CM | POA: Diagnosis not present

## 2013-11-13 DIAGNOSIS — M79609 Pain in unspecified limb: Secondary | ICD-10-CM | POA: Diagnosis not present

## 2013-11-13 DIAGNOSIS — I10 Essential (primary) hypertension: Secondary | ICD-10-CM | POA: Diagnosis not present

## 2013-11-13 DIAGNOSIS — M7989 Other specified soft tissue disorders: Secondary | ICD-10-CM | POA: Diagnosis not present

## 2013-11-13 DIAGNOSIS — Z79899 Other long term (current) drug therapy: Secondary | ICD-10-CM | POA: Diagnosis not present

## 2013-11-23 DIAGNOSIS — M25579 Pain in unspecified ankle and joints of unspecified foot: Secondary | ICD-10-CM | POA: Diagnosis not present

## 2013-11-23 DIAGNOSIS — F329 Major depressive disorder, single episode, unspecified: Secondary | ICD-10-CM | POA: Diagnosis not present

## 2013-11-23 DIAGNOSIS — G4700 Insomnia, unspecified: Secondary | ICD-10-CM | POA: Diagnosis not present

## 2013-11-23 DIAGNOSIS — F3289 Other specified depressive episodes: Secondary | ICD-10-CM | POA: Diagnosis not present

## 2013-11-23 DIAGNOSIS — R569 Unspecified convulsions: Secondary | ICD-10-CM | POA: Diagnosis not present

## 2013-11-25 DIAGNOSIS — S93409A Sprain of unspecified ligament of unspecified ankle, initial encounter: Secondary | ICD-10-CM | POA: Diagnosis not present

## 2013-11-25 DIAGNOSIS — M25579 Pain in unspecified ankle and joints of unspecified foot: Secondary | ICD-10-CM | POA: Diagnosis not present

## 2013-12-13 DIAGNOSIS — S93409A Sprain of unspecified ligament of unspecified ankle, initial encounter: Secondary | ICD-10-CM | POA: Diagnosis not present

## 2013-12-13 DIAGNOSIS — M19079 Primary osteoarthritis, unspecified ankle and foot: Secondary | ICD-10-CM | POA: Diagnosis not present

## 2013-12-13 DIAGNOSIS — M25579 Pain in unspecified ankle and joints of unspecified foot: Secondary | ICD-10-CM | POA: Diagnosis not present

## 2013-12-15 DIAGNOSIS — M779 Enthesopathy, unspecified: Secondary | ICD-10-CM | POA: Diagnosis not present

## 2013-12-15 DIAGNOSIS — M25579 Pain in unspecified ankle and joints of unspecified foot: Secondary | ICD-10-CM | POA: Diagnosis not present

## 2013-12-23 DIAGNOSIS — F329 Major depressive disorder, single episode, unspecified: Secondary | ICD-10-CM | POA: Diagnosis not present

## 2013-12-23 DIAGNOSIS — R569 Unspecified convulsions: Secondary | ICD-10-CM | POA: Diagnosis not present

## 2013-12-23 DIAGNOSIS — M159 Polyosteoarthritis, unspecified: Secondary | ICD-10-CM | POA: Diagnosis not present

## 2013-12-23 DIAGNOSIS — G4700 Insomnia, unspecified: Secondary | ICD-10-CM | POA: Diagnosis not present

## 2013-12-23 DIAGNOSIS — F3289 Other specified depressive episodes: Secondary | ICD-10-CM | POA: Diagnosis not present

## 2014-01-26 DIAGNOSIS — F3289 Other specified depressive episodes: Secondary | ICD-10-CM | POA: Diagnosis not present

## 2014-01-26 DIAGNOSIS — M545 Low back pain, unspecified: Secondary | ICD-10-CM | POA: Diagnosis not present

## 2014-01-26 DIAGNOSIS — G4700 Insomnia, unspecified: Secondary | ICD-10-CM | POA: Diagnosis not present

## 2014-01-26 DIAGNOSIS — M25579 Pain in unspecified ankle and joints of unspecified foot: Secondary | ICD-10-CM | POA: Diagnosis not present

## 2014-01-26 DIAGNOSIS — F329 Major depressive disorder, single episode, unspecified: Secondary | ICD-10-CM | POA: Diagnosis not present

## 2014-01-26 DIAGNOSIS — R569 Unspecified convulsions: Secondary | ICD-10-CM | POA: Diagnosis not present

## 2014-03-09 DIAGNOSIS — M19079 Primary osteoarthritis, unspecified ankle and foot: Secondary | ICD-10-CM | POA: Diagnosis not present

## 2014-03-09 DIAGNOSIS — M25579 Pain in unspecified ankle and joints of unspecified foot: Secondary | ICD-10-CM | POA: Diagnosis not present

## 2014-04-15 DIAGNOSIS — T6391XA Toxic effect of contact with unspecified venomous animal, accidental (unintentional), initial encounter: Secondary | ICD-10-CM | POA: Diagnosis not present

## 2014-04-15 DIAGNOSIS — R229 Localized swelling, mass and lump, unspecified: Secondary | ICD-10-CM | POA: Diagnosis not present

## 2014-04-15 DIAGNOSIS — T466X5A Adverse effect of antihyperlipidemic and antiarteriosclerotic drugs, initial encounter: Secondary | ICD-10-CM | POA: Diagnosis not present

## 2014-04-15 DIAGNOSIS — I10 Essential (primary) hypertension: Secondary | ICD-10-CM | POA: Diagnosis not present

## 2014-04-15 DIAGNOSIS — T782XXA Anaphylactic shock, unspecified, initial encounter: Secondary | ICD-10-CM | POA: Diagnosis not present

## 2014-04-15 DIAGNOSIS — L259 Unspecified contact dermatitis, unspecified cause: Secondary | ICD-10-CM | POA: Diagnosis not present

## 2014-04-15 DIAGNOSIS — Z79899 Other long term (current) drug therapy: Secondary | ICD-10-CM | POA: Diagnosis not present

## 2014-04-22 DIAGNOSIS — M6580 Other synovitis and tenosynovitis, unspecified site: Secondary | ICD-10-CM | POA: Diagnosis not present

## 2014-05-16 DIAGNOSIS — G4700 Insomnia, unspecified: Secondary | ICD-10-CM | POA: Diagnosis not present

## 2014-05-16 DIAGNOSIS — F329 Major depressive disorder, single episode, unspecified: Secondary | ICD-10-CM | POA: Diagnosis not present

## 2014-05-16 DIAGNOSIS — F3289 Other specified depressive episodes: Secondary | ICD-10-CM | POA: Diagnosis not present

## 2014-05-16 DIAGNOSIS — J029 Acute pharyngitis, unspecified: Secondary | ICD-10-CM | POA: Diagnosis not present

## 2014-05-16 DIAGNOSIS — R55 Syncope and collapse: Secondary | ICD-10-CM | POA: Diagnosis not present

## 2014-05-25 DIAGNOSIS — M159 Polyosteoarthritis, unspecified: Secondary | ICD-10-CM | POA: Diagnosis not present

## 2014-05-25 DIAGNOSIS — F329 Major depressive disorder, single episode, unspecified: Secondary | ICD-10-CM | POA: Diagnosis not present

## 2014-05-25 DIAGNOSIS — IMO0001 Reserved for inherently not codable concepts without codable children: Secondary | ICD-10-CM | POA: Diagnosis not present

## 2014-05-25 DIAGNOSIS — J029 Acute pharyngitis, unspecified: Secondary | ICD-10-CM | POA: Diagnosis not present

## 2014-05-25 DIAGNOSIS — F3289 Other specified depressive episodes: Secondary | ICD-10-CM | POA: Diagnosis not present

## 2014-06-20 DIAGNOSIS — M159 Polyosteoarthritis, unspecified: Secondary | ICD-10-CM | POA: Diagnosis not present

## 2014-06-20 DIAGNOSIS — F3289 Other specified depressive episodes: Secondary | ICD-10-CM | POA: Diagnosis not present

## 2014-06-20 DIAGNOSIS — F329 Major depressive disorder, single episode, unspecified: Secondary | ICD-10-CM | POA: Diagnosis not present

## 2014-06-20 DIAGNOSIS — L259 Unspecified contact dermatitis, unspecified cause: Secondary | ICD-10-CM | POA: Diagnosis not present

## 2014-06-20 DIAGNOSIS — F411 Generalized anxiety disorder: Secondary | ICD-10-CM | POA: Diagnosis not present

## 2014-07-12 DIAGNOSIS — Z1231 Encounter for screening mammogram for malignant neoplasm of breast: Secondary | ICD-10-CM | POA: Diagnosis not present

## 2014-08-26 DIAGNOSIS — K589 Irritable bowel syndrome without diarrhea: Secondary | ICD-10-CM | POA: Diagnosis not present

## 2014-08-26 DIAGNOSIS — K648 Other hemorrhoids: Secondary | ICD-10-CM | POA: Diagnosis not present

## 2014-09-08 DIAGNOSIS — M797 Fibromyalgia: Secondary | ICD-10-CM | POA: Diagnosis not present

## 2014-09-08 DIAGNOSIS — M159 Polyosteoarthritis, unspecified: Secondary | ICD-10-CM | POA: Diagnosis not present

## 2014-09-08 DIAGNOSIS — J029 Acute pharyngitis, unspecified: Secondary | ICD-10-CM | POA: Diagnosis not present

## 2014-09-08 DIAGNOSIS — R55 Syncope and collapse: Secondary | ICD-10-CM | POA: Diagnosis not present

## 2014-09-08 DIAGNOSIS — F329 Major depressive disorder, single episode, unspecified: Secondary | ICD-10-CM | POA: Diagnosis not present

## 2014-09-08 DIAGNOSIS — R569 Unspecified convulsions: Secondary | ICD-10-CM | POA: Diagnosis not present

## 2014-09-08 DIAGNOSIS — K219 Gastro-esophageal reflux disease without esophagitis: Secondary | ICD-10-CM | POA: Diagnosis not present

## 2014-09-08 DIAGNOSIS — E039 Hypothyroidism, unspecified: Secondary | ICD-10-CM | POA: Diagnosis not present

## 2014-10-05 DIAGNOSIS — R300 Dysuria: Secondary | ICD-10-CM | POA: Diagnosis not present

## 2014-10-05 DIAGNOSIS — M549 Dorsalgia, unspecified: Secondary | ICD-10-CM | POA: Diagnosis not present

## 2014-10-05 DIAGNOSIS — Z23 Encounter for immunization: Secondary | ICD-10-CM | POA: Diagnosis not present

## 2014-10-05 DIAGNOSIS — N3000 Acute cystitis without hematuria: Secondary | ICD-10-CM | POA: Diagnosis not present

## 2014-10-05 DIAGNOSIS — Z6837 Body mass index (BMI) 37.0-37.9, adult: Secondary | ICD-10-CM | POA: Diagnosis not present

## 2014-10-17 DIAGNOSIS — Z79899 Other long term (current) drug therapy: Secondary | ICD-10-CM | POA: Diagnosis not present

## 2014-10-17 DIAGNOSIS — J029 Acute pharyngitis, unspecified: Secondary | ICD-10-CM | POA: Diagnosis not present

## 2014-11-02 DIAGNOSIS — M797 Fibromyalgia: Secondary | ICD-10-CM | POA: Diagnosis not present

## 2014-11-02 DIAGNOSIS — E039 Hypothyroidism, unspecified: Secondary | ICD-10-CM | POA: Diagnosis not present

## 2014-11-02 DIAGNOSIS — J029 Acute pharyngitis, unspecified: Secondary | ICD-10-CM | POA: Diagnosis not present

## 2014-11-02 DIAGNOSIS — R569 Unspecified convulsions: Secondary | ICD-10-CM | POA: Diagnosis not present

## 2014-11-02 DIAGNOSIS — K219 Gastro-esophageal reflux disease without esophagitis: Secondary | ICD-10-CM | POA: Diagnosis not present

## 2014-11-02 DIAGNOSIS — G5620 Lesion of ulnar nerve, unspecified upper limb: Secondary | ICD-10-CM | POA: Diagnosis not present

## 2014-11-02 DIAGNOSIS — G4700 Insomnia, unspecified: Secondary | ICD-10-CM | POA: Diagnosis not present

## 2014-11-02 DIAGNOSIS — M159 Polyosteoarthritis, unspecified: Secondary | ICD-10-CM | POA: Diagnosis not present

## 2014-11-09 DIAGNOSIS — R569 Unspecified convulsions: Secondary | ICD-10-CM | POA: Diagnosis not present

## 2014-11-09 DIAGNOSIS — F329 Major depressive disorder, single episode, unspecified: Secondary | ICD-10-CM | POA: Diagnosis not present

## 2014-11-09 DIAGNOSIS — K219 Gastro-esophageal reflux disease without esophagitis: Secondary | ICD-10-CM | POA: Diagnosis not present

## 2014-11-09 DIAGNOSIS — R300 Dysuria: Secondary | ICD-10-CM | POA: Diagnosis not present

## 2014-11-09 DIAGNOSIS — E039 Hypothyroidism, unspecified: Secondary | ICD-10-CM | POA: Diagnosis not present

## 2014-11-09 DIAGNOSIS — G5620 Lesion of ulnar nerve, unspecified upper limb: Secondary | ICD-10-CM | POA: Diagnosis not present

## 2014-11-09 DIAGNOSIS — G4700 Insomnia, unspecified: Secondary | ICD-10-CM | POA: Diagnosis not present

## 2014-11-09 DIAGNOSIS — M159 Polyosteoarthritis, unspecified: Secondary | ICD-10-CM | POA: Diagnosis not present

## 2014-11-23 DIAGNOSIS — R55 Syncope and collapse: Secondary | ICD-10-CM | POA: Diagnosis not present

## 2014-11-23 DIAGNOSIS — M797 Fibromyalgia: Secondary | ICD-10-CM | POA: Diagnosis not present

## 2014-11-23 DIAGNOSIS — K219 Gastro-esophageal reflux disease without esophagitis: Secondary | ICD-10-CM | POA: Diagnosis not present

## 2014-11-23 DIAGNOSIS — R569 Unspecified convulsions: Secondary | ICD-10-CM | POA: Diagnosis not present

## 2014-11-23 DIAGNOSIS — E039 Hypothyroidism, unspecified: Secondary | ICD-10-CM | POA: Diagnosis not present

## 2014-11-23 DIAGNOSIS — F419 Anxiety disorder, unspecified: Secondary | ICD-10-CM | POA: Diagnosis not present

## 2014-11-23 DIAGNOSIS — M159 Polyosteoarthritis, unspecified: Secondary | ICD-10-CM | POA: Diagnosis not present

## 2014-11-23 DIAGNOSIS — F329 Major depressive disorder, single episode, unspecified: Secondary | ICD-10-CM | POA: Diagnosis not present

## 2014-11-23 DIAGNOSIS — E669 Obesity, unspecified: Secondary | ICD-10-CM | POA: Diagnosis not present

## 2014-11-23 DIAGNOSIS — G4700 Insomnia, unspecified: Secondary | ICD-10-CM | POA: Diagnosis not present

## 2014-11-23 DIAGNOSIS — G5620 Lesion of ulnar nerve, unspecified upper limb: Secondary | ICD-10-CM | POA: Diagnosis not present

## 2014-11-23 DIAGNOSIS — Z6837 Body mass index (BMI) 37.0-37.9, adult: Secondary | ICD-10-CM | POA: Diagnosis not present

## 2014-12-06 DIAGNOSIS — N3289 Other specified disorders of bladder: Secondary | ICD-10-CM | POA: Diagnosis not present

## 2014-12-06 DIAGNOSIS — Z87442 Personal history of urinary calculi: Secondary | ICD-10-CM | POA: Diagnosis not present

## 2014-12-08 DIAGNOSIS — R569 Unspecified convulsions: Secondary | ICD-10-CM | POA: Diagnosis not present

## 2014-12-08 DIAGNOSIS — M797 Fibromyalgia: Secondary | ICD-10-CM | POA: Diagnosis not present

## 2014-12-08 DIAGNOSIS — G4762 Sleep related leg cramps: Secondary | ICD-10-CM | POA: Diagnosis not present

## 2014-12-08 DIAGNOSIS — E669 Obesity, unspecified: Secondary | ICD-10-CM | POA: Diagnosis not present

## 2014-12-08 DIAGNOSIS — E039 Hypothyroidism, unspecified: Secondary | ICD-10-CM | POA: Diagnosis not present

## 2014-12-08 DIAGNOSIS — G4700 Insomnia, unspecified: Secondary | ICD-10-CM | POA: Diagnosis not present

## 2014-12-08 DIAGNOSIS — G5620 Lesion of ulnar nerve, unspecified upper limb: Secondary | ICD-10-CM | POA: Diagnosis not present

## 2014-12-08 DIAGNOSIS — F419 Anxiety disorder, unspecified: Secondary | ICD-10-CM | POA: Diagnosis not present

## 2014-12-08 DIAGNOSIS — R55 Syncope and collapse: Secondary | ICD-10-CM | POA: Diagnosis not present

## 2014-12-08 DIAGNOSIS — F329 Major depressive disorder, single episode, unspecified: Secondary | ICD-10-CM | POA: Diagnosis not present

## 2014-12-08 DIAGNOSIS — M159 Polyosteoarthritis, unspecified: Secondary | ICD-10-CM | POA: Diagnosis not present

## 2014-12-08 DIAGNOSIS — K219 Gastro-esophageal reflux disease without esophagitis: Secondary | ICD-10-CM | POA: Diagnosis not present

## 2014-12-13 DIAGNOSIS — E669 Obesity, unspecified: Secondary | ICD-10-CM | POA: Diagnosis not present

## 2014-12-13 DIAGNOSIS — R635 Abnormal weight gain: Secondary | ICD-10-CM | POA: Diagnosis not present

## 2014-12-13 DIAGNOSIS — Z79899 Other long term (current) drug therapy: Secondary | ICD-10-CM | POA: Diagnosis not present

## 2014-12-13 DIAGNOSIS — Z6838 Body mass index (BMI) 38.0-38.9, adult: Secondary | ICD-10-CM | POA: Diagnosis not present

## 2014-12-14 DIAGNOSIS — R300 Dysuria: Secondary | ICD-10-CM | POA: Diagnosis not present

## 2014-12-14 DIAGNOSIS — N200 Calculus of kidney: Secondary | ICD-10-CM | POA: Diagnosis not present

## 2014-12-14 DIAGNOSIS — N281 Cyst of kidney, acquired: Secondary | ICD-10-CM | POA: Diagnosis not present

## 2014-12-14 DIAGNOSIS — R109 Unspecified abdominal pain: Secondary | ICD-10-CM | POA: Diagnosis not present

## 2014-12-16 DIAGNOSIS — G5620 Lesion of ulnar nerve, unspecified upper limb: Secondary | ICD-10-CM | POA: Diagnosis not present

## 2014-12-16 DIAGNOSIS — L309 Dermatitis, unspecified: Secondary | ICD-10-CM | POA: Diagnosis not present

## 2014-12-16 DIAGNOSIS — E669 Obesity, unspecified: Secondary | ICD-10-CM | POA: Diagnosis not present

## 2014-12-16 DIAGNOSIS — E039 Hypothyroidism, unspecified: Secondary | ICD-10-CM | POA: Diagnosis not present

## 2014-12-16 DIAGNOSIS — G4762 Sleep related leg cramps: Secondary | ICD-10-CM | POA: Diagnosis not present

## 2014-12-16 DIAGNOSIS — F419 Anxiety disorder, unspecified: Secondary | ICD-10-CM | POA: Diagnosis not present

## 2014-12-16 DIAGNOSIS — M159 Polyosteoarthritis, unspecified: Secondary | ICD-10-CM | POA: Diagnosis not present

## 2014-12-16 DIAGNOSIS — F329 Major depressive disorder, single episode, unspecified: Secondary | ICD-10-CM | POA: Diagnosis not present

## 2014-12-16 DIAGNOSIS — R569 Unspecified convulsions: Secondary | ICD-10-CM | POA: Diagnosis not present

## 2014-12-16 DIAGNOSIS — R55 Syncope and collapse: Secondary | ICD-10-CM | POA: Diagnosis not present

## 2014-12-16 DIAGNOSIS — M797 Fibromyalgia: Secondary | ICD-10-CM | POA: Diagnosis not present

## 2014-12-16 DIAGNOSIS — K219 Gastro-esophageal reflux disease without esophagitis: Secondary | ICD-10-CM | POA: Diagnosis not present

## 2014-12-26 DIAGNOSIS — N3289 Other specified disorders of bladder: Secondary | ICD-10-CM | POA: Diagnosis not present

## 2014-12-26 DIAGNOSIS — R109 Unspecified abdominal pain: Secondary | ICD-10-CM | POA: Diagnosis not present

## 2015-01-10 DIAGNOSIS — G4700 Insomnia, unspecified: Secondary | ICD-10-CM | POA: Diagnosis not present

## 2015-01-10 DIAGNOSIS — M797 Fibromyalgia: Secondary | ICD-10-CM | POA: Diagnosis not present

## 2015-01-10 DIAGNOSIS — E039 Hypothyroidism, unspecified: Secondary | ICD-10-CM | POA: Diagnosis not present

## 2015-01-10 DIAGNOSIS — F419 Anxiety disorder, unspecified: Secondary | ICD-10-CM | POA: Diagnosis not present

## 2015-01-10 DIAGNOSIS — G4762 Sleep related leg cramps: Secondary | ICD-10-CM | POA: Diagnosis not present

## 2015-01-10 DIAGNOSIS — K219 Gastro-esophageal reflux disease without esophagitis: Secondary | ICD-10-CM | POA: Diagnosis not present

## 2015-01-10 DIAGNOSIS — R569 Unspecified convulsions: Secondary | ICD-10-CM | POA: Diagnosis not present

## 2015-01-10 DIAGNOSIS — G5620 Lesion of ulnar nerve, unspecified upper limb: Secondary | ICD-10-CM | POA: Diagnosis not present

## 2015-01-10 DIAGNOSIS — R55 Syncope and collapse: Secondary | ICD-10-CM | POA: Diagnosis not present

## 2015-01-10 DIAGNOSIS — R112 Nausea with vomiting, unspecified: Secondary | ICD-10-CM | POA: Diagnosis not present

## 2015-01-10 DIAGNOSIS — F329 Major depressive disorder, single episode, unspecified: Secondary | ICD-10-CM | POA: Diagnosis not present

## 2015-01-10 DIAGNOSIS — M159 Polyosteoarthritis, unspecified: Secondary | ICD-10-CM | POA: Diagnosis not present

## 2015-01-12 DIAGNOSIS — Z6836 Body mass index (BMI) 36.0-36.9, adult: Secondary | ICD-10-CM | POA: Diagnosis not present

## 2015-01-12 DIAGNOSIS — R635 Abnormal weight gain: Secondary | ICD-10-CM | POA: Diagnosis not present

## 2015-01-12 DIAGNOSIS — E669 Obesity, unspecified: Secondary | ICD-10-CM | POA: Diagnosis not present

## 2015-01-12 DIAGNOSIS — Z79899 Other long term (current) drug therapy: Secondary | ICD-10-CM | POA: Diagnosis not present

## 2015-01-17 DIAGNOSIS — F419 Anxiety disorder, unspecified: Secondary | ICD-10-CM | POA: Diagnosis not present

## 2015-01-17 DIAGNOSIS — K219 Gastro-esophageal reflux disease without esophagitis: Secondary | ICD-10-CM | POA: Diagnosis not present

## 2015-01-17 DIAGNOSIS — F329 Major depressive disorder, single episode, unspecified: Secondary | ICD-10-CM | POA: Diagnosis not present

## 2015-01-17 DIAGNOSIS — R569 Unspecified convulsions: Secondary | ICD-10-CM | POA: Diagnosis not present

## 2015-01-17 DIAGNOSIS — E039 Hypothyroidism, unspecified: Secondary | ICD-10-CM | POA: Diagnosis not present

## 2015-01-17 DIAGNOSIS — G4762 Sleep related leg cramps: Secondary | ICD-10-CM | POA: Diagnosis not present

## 2015-01-17 DIAGNOSIS — G4700 Insomnia, unspecified: Secondary | ICD-10-CM | POA: Diagnosis not present

## 2015-01-17 DIAGNOSIS — M797 Fibromyalgia: Secondary | ICD-10-CM | POA: Diagnosis not present

## 2015-01-17 DIAGNOSIS — R5383 Other fatigue: Secondary | ICD-10-CM | POA: Diagnosis not present

## 2015-01-17 DIAGNOSIS — G5620 Lesion of ulnar nerve, unspecified upper limb: Secondary | ICD-10-CM | POA: Diagnosis not present

## 2015-01-17 DIAGNOSIS — R55 Syncope and collapse: Secondary | ICD-10-CM | POA: Diagnosis not present

## 2015-01-17 DIAGNOSIS — M159 Polyosteoarthritis, unspecified: Secondary | ICD-10-CM | POA: Diagnosis not present

## 2015-02-20 DIAGNOSIS — R569 Unspecified convulsions: Secondary | ICD-10-CM | POA: Diagnosis not present

## 2015-02-20 DIAGNOSIS — K219 Gastro-esophageal reflux disease without esophagitis: Secondary | ICD-10-CM | POA: Diagnosis not present

## 2015-02-20 DIAGNOSIS — E039 Hypothyroidism, unspecified: Secondary | ICD-10-CM | POA: Diagnosis not present

## 2015-02-20 DIAGNOSIS — F329 Major depressive disorder, single episode, unspecified: Secondary | ICD-10-CM | POA: Diagnosis not present

## 2015-02-20 DIAGNOSIS — M159 Polyosteoarthritis, unspecified: Secondary | ICD-10-CM | POA: Diagnosis not present

## 2015-02-20 DIAGNOSIS — G5620 Lesion of ulnar nerve, unspecified upper limb: Secondary | ICD-10-CM | POA: Diagnosis not present

## 2015-02-20 DIAGNOSIS — G4762 Sleep related leg cramps: Secondary | ICD-10-CM | POA: Diagnosis not present

## 2015-02-20 DIAGNOSIS — E669 Obesity, unspecified: Secondary | ICD-10-CM | POA: Diagnosis not present

## 2015-02-20 DIAGNOSIS — R55 Syncope and collapse: Secondary | ICD-10-CM | POA: Diagnosis not present

## 2015-02-20 DIAGNOSIS — G4700 Insomnia, unspecified: Secondary | ICD-10-CM | POA: Diagnosis not present

## 2015-02-20 DIAGNOSIS — F419 Anxiety disorder, unspecified: Secondary | ICD-10-CM | POA: Diagnosis not present

## 2015-02-20 DIAGNOSIS — M797 Fibromyalgia: Secondary | ICD-10-CM | POA: Diagnosis not present

## 2015-02-28 DIAGNOSIS — E669 Obesity, unspecified: Secondary | ICD-10-CM | POA: Diagnosis not present

## 2015-02-28 DIAGNOSIS — Z6836 Body mass index (BMI) 36.0-36.9, adult: Secondary | ICD-10-CM | POA: Diagnosis not present

## 2015-02-28 DIAGNOSIS — Z79899 Other long term (current) drug therapy: Secondary | ICD-10-CM | POA: Diagnosis not present

## 2015-02-28 DIAGNOSIS — R635 Abnormal weight gain: Secondary | ICD-10-CM | POA: Diagnosis not present

## 2015-03-05 DIAGNOSIS — M79605 Pain in left leg: Secondary | ICD-10-CM | POA: Diagnosis not present

## 2015-03-05 DIAGNOSIS — M25561 Pain in right knee: Secondary | ICD-10-CM | POA: Diagnosis not present

## 2015-03-05 DIAGNOSIS — M7989 Other specified soft tissue disorders: Secondary | ICD-10-CM | POA: Diagnosis not present

## 2015-03-05 DIAGNOSIS — M79604 Pain in right leg: Secondary | ICD-10-CM | POA: Diagnosis not present

## 2015-03-10 DIAGNOSIS — R569 Unspecified convulsions: Secondary | ICD-10-CM | POA: Diagnosis not present

## 2015-03-10 DIAGNOSIS — E039 Hypothyroidism, unspecified: Secondary | ICD-10-CM | POA: Diagnosis not present

## 2015-03-10 DIAGNOSIS — K219 Gastro-esophageal reflux disease without esophagitis: Secondary | ICD-10-CM | POA: Diagnosis not present

## 2015-03-10 DIAGNOSIS — R55 Syncope and collapse: Secondary | ICD-10-CM | POA: Diagnosis not present

## 2015-03-10 DIAGNOSIS — M159 Polyosteoarthritis, unspecified: Secondary | ICD-10-CM | POA: Diagnosis not present

## 2015-03-10 DIAGNOSIS — G5620 Lesion of ulnar nerve, unspecified upper limb: Secondary | ICD-10-CM | POA: Diagnosis not present

## 2015-03-10 DIAGNOSIS — F329 Major depressive disorder, single episode, unspecified: Secondary | ICD-10-CM | POA: Diagnosis not present

## 2015-03-10 DIAGNOSIS — M25561 Pain in right knee: Secondary | ICD-10-CM | POA: Diagnosis not present

## 2015-03-10 DIAGNOSIS — G4700 Insomnia, unspecified: Secondary | ICD-10-CM | POA: Diagnosis not present

## 2015-03-10 DIAGNOSIS — M797 Fibromyalgia: Secondary | ICD-10-CM | POA: Diagnosis not present

## 2015-03-10 DIAGNOSIS — G4762 Sleep related leg cramps: Secondary | ICD-10-CM | POA: Diagnosis not present

## 2015-03-10 DIAGNOSIS — F419 Anxiety disorder, unspecified: Secondary | ICD-10-CM | POA: Diagnosis not present

## 2015-03-21 DIAGNOSIS — G4700 Insomnia, unspecified: Secondary | ICD-10-CM | POA: Diagnosis not present

## 2015-03-21 DIAGNOSIS — E039 Hypothyroidism, unspecified: Secondary | ICD-10-CM | POA: Diagnosis not present

## 2015-03-21 DIAGNOSIS — R569 Unspecified convulsions: Secondary | ICD-10-CM | POA: Diagnosis not present

## 2015-03-21 DIAGNOSIS — K219 Gastro-esophageal reflux disease without esophagitis: Secondary | ICD-10-CM | POA: Diagnosis not present

## 2015-03-21 DIAGNOSIS — R55 Syncope and collapse: Secondary | ICD-10-CM | POA: Diagnosis not present

## 2015-03-21 DIAGNOSIS — F419 Anxiety disorder, unspecified: Secondary | ICD-10-CM | POA: Diagnosis not present

## 2015-03-21 DIAGNOSIS — F329 Major depressive disorder, single episode, unspecified: Secondary | ICD-10-CM | POA: Diagnosis not present

## 2015-03-21 DIAGNOSIS — M159 Polyosteoarthritis, unspecified: Secondary | ICD-10-CM | POA: Diagnosis not present

## 2015-03-21 DIAGNOSIS — G4762 Sleep related leg cramps: Secondary | ICD-10-CM | POA: Diagnosis not present

## 2015-03-21 DIAGNOSIS — M797 Fibromyalgia: Secondary | ICD-10-CM | POA: Diagnosis not present

## 2015-03-21 DIAGNOSIS — G5620 Lesion of ulnar nerve, unspecified upper limb: Secondary | ICD-10-CM | POA: Diagnosis not present

## 2015-03-21 DIAGNOSIS — E669 Obesity, unspecified: Secondary | ICD-10-CM | POA: Diagnosis not present

## 2015-03-29 DIAGNOSIS — R109 Unspecified abdominal pain: Secondary | ICD-10-CM | POA: Diagnosis not present

## 2015-03-29 DIAGNOSIS — N281 Cyst of kidney, acquired: Secondary | ICD-10-CM | POA: Diagnosis not present

## 2015-03-29 DIAGNOSIS — Z9049 Acquired absence of other specified parts of digestive tract: Secondary | ICD-10-CM | POA: Diagnosis not present

## 2015-03-29 DIAGNOSIS — R112 Nausea with vomiting, unspecified: Secondary | ICD-10-CM | POA: Diagnosis not present

## 2015-03-29 DIAGNOSIS — R1011 Right upper quadrant pain: Secondary | ICD-10-CM | POA: Diagnosis not present

## 2015-03-29 DIAGNOSIS — R10814 Left lower quadrant abdominal tenderness: Secondary | ICD-10-CM | POA: Diagnosis not present

## 2015-03-29 DIAGNOSIS — R1031 Right lower quadrant pain: Secondary | ICD-10-CM | POA: Diagnosis not present

## 2015-03-29 DIAGNOSIS — E860 Dehydration: Secondary | ICD-10-CM | POA: Diagnosis not present

## 2015-03-29 DIAGNOSIS — K2970 Gastritis, unspecified, without bleeding: Secondary | ICD-10-CM | POA: Diagnosis not present

## 2015-03-30 DIAGNOSIS — Z79899 Other long term (current) drug therapy: Secondary | ICD-10-CM | POA: Diagnosis not present

## 2015-03-30 DIAGNOSIS — Z6836 Body mass index (BMI) 36.0-36.9, adult: Secondary | ICD-10-CM | POA: Diagnosis not present

## 2015-03-30 DIAGNOSIS — R635 Abnormal weight gain: Secondary | ICD-10-CM | POA: Diagnosis not present

## 2015-04-05 DIAGNOSIS — M159 Polyosteoarthritis, unspecified: Secondary | ICD-10-CM | POA: Diagnosis not present

## 2015-04-05 DIAGNOSIS — M797 Fibromyalgia: Secondary | ICD-10-CM | POA: Diagnosis not present

## 2015-04-05 DIAGNOSIS — F329 Major depressive disorder, single episode, unspecified: Secondary | ICD-10-CM | POA: Diagnosis not present

## 2015-04-05 DIAGNOSIS — K219 Gastro-esophageal reflux disease without esophagitis: Secondary | ICD-10-CM | POA: Diagnosis not present

## 2015-04-05 DIAGNOSIS — J028 Acute pharyngitis due to other specified organisms: Secondary | ICD-10-CM | POA: Diagnosis not present

## 2015-04-05 DIAGNOSIS — R55 Syncope and collapse: Secondary | ICD-10-CM | POA: Diagnosis not present

## 2015-04-05 DIAGNOSIS — G4700 Insomnia, unspecified: Secondary | ICD-10-CM | POA: Diagnosis not present

## 2015-04-05 DIAGNOSIS — F419 Anxiety disorder, unspecified: Secondary | ICD-10-CM | POA: Diagnosis not present

## 2015-04-05 DIAGNOSIS — E039 Hypothyroidism, unspecified: Secondary | ICD-10-CM | POA: Diagnosis not present

## 2015-04-05 DIAGNOSIS — G5620 Lesion of ulnar nerve, unspecified upper limb: Secondary | ICD-10-CM | POA: Diagnosis not present

## 2015-04-05 DIAGNOSIS — J029 Acute pharyngitis, unspecified: Secondary | ICD-10-CM | POA: Diagnosis not present

## 2015-04-05 DIAGNOSIS — R569 Unspecified convulsions: Secondary | ICD-10-CM | POA: Diagnosis not present

## 2015-04-18 DIAGNOSIS — R1013 Epigastric pain: Secondary | ICD-10-CM | POA: Diagnosis not present

## 2015-04-18 DIAGNOSIS — K580 Irritable bowel syndrome with diarrhea: Secondary | ICD-10-CM | POA: Diagnosis not present

## 2015-04-18 DIAGNOSIS — R112 Nausea with vomiting, unspecified: Secondary | ICD-10-CM | POA: Diagnosis not present

## 2015-04-26 DIAGNOSIS — F419 Anxiety disorder, unspecified: Secondary | ICD-10-CM | POA: Diagnosis not present

## 2015-04-26 DIAGNOSIS — G4762 Sleep related leg cramps: Secondary | ICD-10-CM | POA: Diagnosis not present

## 2015-04-26 DIAGNOSIS — E669 Obesity, unspecified: Secondary | ICD-10-CM | POA: Diagnosis not present

## 2015-04-26 DIAGNOSIS — R55 Syncope and collapse: Secondary | ICD-10-CM | POA: Diagnosis not present

## 2015-04-26 DIAGNOSIS — M159 Polyosteoarthritis, unspecified: Secondary | ICD-10-CM | POA: Diagnosis not present

## 2015-04-26 DIAGNOSIS — R569 Unspecified convulsions: Secondary | ICD-10-CM | POA: Diagnosis not present

## 2015-04-26 DIAGNOSIS — K219 Gastro-esophageal reflux disease without esophagitis: Secondary | ICD-10-CM | POA: Diagnosis not present

## 2015-04-26 DIAGNOSIS — G4700 Insomnia, unspecified: Secondary | ICD-10-CM | POA: Diagnosis not present

## 2015-04-26 DIAGNOSIS — E039 Hypothyroidism, unspecified: Secondary | ICD-10-CM | POA: Diagnosis not present

## 2015-04-26 DIAGNOSIS — F329 Major depressive disorder, single episode, unspecified: Secondary | ICD-10-CM | POA: Diagnosis not present

## 2015-04-26 DIAGNOSIS — M797 Fibromyalgia: Secondary | ICD-10-CM | POA: Diagnosis not present

## 2015-04-26 DIAGNOSIS — G5620 Lesion of ulnar nerve, unspecified upper limb: Secondary | ICD-10-CM | POA: Diagnosis not present

## 2015-04-28 DIAGNOSIS — R109 Unspecified abdominal pain: Secondary | ICD-10-CM | POA: Diagnosis not present

## 2015-04-28 DIAGNOSIS — K219 Gastro-esophageal reflux disease without esophagitis: Secondary | ICD-10-CM | POA: Diagnosis not present

## 2015-04-28 DIAGNOSIS — R1013 Epigastric pain: Secondary | ICD-10-CM | POA: Diagnosis not present

## 2015-04-28 DIAGNOSIS — K2900 Acute gastritis without bleeding: Secondary | ICD-10-CM | POA: Diagnosis not present

## 2015-04-28 DIAGNOSIS — R112 Nausea with vomiting, unspecified: Secondary | ICD-10-CM | POA: Diagnosis not present

## 2015-04-28 DIAGNOSIS — K2970 Gastritis, unspecified, without bleeding: Secondary | ICD-10-CM | POA: Diagnosis not present

## 2015-05-05 DIAGNOSIS — G4762 Sleep related leg cramps: Secondary | ICD-10-CM | POA: Diagnosis not present

## 2015-05-05 DIAGNOSIS — F329 Major depressive disorder, single episode, unspecified: Secondary | ICD-10-CM | POA: Diagnosis not present

## 2015-05-05 DIAGNOSIS — S060X9A Concussion with loss of consciousness of unspecified duration, initial encounter: Secondary | ICD-10-CM | POA: Diagnosis not present

## 2015-05-05 DIAGNOSIS — M797 Fibromyalgia: Secondary | ICD-10-CM | POA: Diagnosis not present

## 2015-05-05 DIAGNOSIS — G5620 Lesion of ulnar nerve, unspecified upper limb: Secondary | ICD-10-CM | POA: Diagnosis not present

## 2015-05-05 DIAGNOSIS — G4700 Insomnia, unspecified: Secondary | ICD-10-CM | POA: Diagnosis not present

## 2015-05-05 DIAGNOSIS — M159 Polyosteoarthritis, unspecified: Secondary | ICD-10-CM | POA: Diagnosis not present

## 2015-05-05 DIAGNOSIS — R569 Unspecified convulsions: Secondary | ICD-10-CM | POA: Diagnosis not present

## 2015-05-05 DIAGNOSIS — R55 Syncope and collapse: Secondary | ICD-10-CM | POA: Diagnosis not present

## 2015-05-05 DIAGNOSIS — K219 Gastro-esophageal reflux disease without esophagitis: Secondary | ICD-10-CM | POA: Diagnosis not present

## 2015-05-05 DIAGNOSIS — F419 Anxiety disorder, unspecified: Secondary | ICD-10-CM | POA: Diagnosis not present

## 2015-05-05 DIAGNOSIS — E039 Hypothyroidism, unspecified: Secondary | ICD-10-CM | POA: Diagnosis not present

## 2015-05-19 DIAGNOSIS — B86 Scabies: Secondary | ICD-10-CM | POA: Diagnosis not present

## 2015-05-26 DIAGNOSIS — E039 Hypothyroidism, unspecified: Secondary | ICD-10-CM | POA: Diagnosis not present

## 2015-05-26 DIAGNOSIS — G4762 Sleep related leg cramps: Secondary | ICD-10-CM | POA: Diagnosis not present

## 2015-05-26 DIAGNOSIS — R569 Unspecified convulsions: Secondary | ICD-10-CM | POA: Diagnosis not present

## 2015-05-26 DIAGNOSIS — K219 Gastro-esophageal reflux disease without esophagitis: Secondary | ICD-10-CM | POA: Diagnosis not present

## 2015-05-26 DIAGNOSIS — F419 Anxiety disorder, unspecified: Secondary | ICD-10-CM | POA: Diagnosis not present

## 2015-05-26 DIAGNOSIS — G4700 Insomnia, unspecified: Secondary | ICD-10-CM | POA: Diagnosis not present

## 2015-05-26 DIAGNOSIS — M159 Polyosteoarthritis, unspecified: Secondary | ICD-10-CM | POA: Diagnosis not present

## 2015-05-26 DIAGNOSIS — R55 Syncope and collapse: Secondary | ICD-10-CM | POA: Diagnosis not present

## 2015-05-26 DIAGNOSIS — M797 Fibromyalgia: Secondary | ICD-10-CM | POA: Diagnosis not present

## 2015-05-26 DIAGNOSIS — F329 Major depressive disorder, single episode, unspecified: Secondary | ICD-10-CM | POA: Diagnosis not present

## 2015-05-26 DIAGNOSIS — L309 Dermatitis, unspecified: Secondary | ICD-10-CM | POA: Diagnosis not present

## 2015-05-26 DIAGNOSIS — G5620 Lesion of ulnar nerve, unspecified upper limb: Secondary | ICD-10-CM | POA: Diagnosis not present

## 2015-05-30 DIAGNOSIS — E039 Hypothyroidism, unspecified: Secondary | ICD-10-CM | POA: Diagnosis not present

## 2015-05-30 DIAGNOSIS — R55 Syncope and collapse: Secondary | ICD-10-CM | POA: Diagnosis not present

## 2015-05-30 DIAGNOSIS — R569 Unspecified convulsions: Secondary | ICD-10-CM | POA: Diagnosis not present

## 2015-05-30 DIAGNOSIS — M797 Fibromyalgia: Secondary | ICD-10-CM | POA: Diagnosis not present

## 2015-05-30 DIAGNOSIS — R768 Other specified abnormal immunological findings in serum: Secondary | ICD-10-CM | POA: Diagnosis not present

## 2015-05-30 DIAGNOSIS — F329 Major depressive disorder, single episode, unspecified: Secondary | ICD-10-CM | POA: Diagnosis not present

## 2015-05-30 DIAGNOSIS — K219 Gastro-esophageal reflux disease without esophagitis: Secondary | ICD-10-CM | POA: Diagnosis not present

## 2015-05-30 DIAGNOSIS — G4762 Sleep related leg cramps: Secondary | ICD-10-CM | POA: Diagnosis not present

## 2015-05-30 DIAGNOSIS — F419 Anxiety disorder, unspecified: Secondary | ICD-10-CM | POA: Diagnosis not present

## 2015-05-30 DIAGNOSIS — G4700 Insomnia, unspecified: Secondary | ICD-10-CM | POA: Diagnosis not present

## 2015-05-30 DIAGNOSIS — M159 Polyosteoarthritis, unspecified: Secondary | ICD-10-CM | POA: Diagnosis not present

## 2015-05-30 DIAGNOSIS — G5620 Lesion of ulnar nerve, unspecified upper limb: Secondary | ICD-10-CM | POA: Diagnosis not present

## 2015-06-08 DIAGNOSIS — L299 Pruritus, unspecified: Secondary | ICD-10-CM | POA: Diagnosis not present

## 2015-06-08 DIAGNOSIS — L300 Nummular dermatitis: Secondary | ICD-10-CM | POA: Diagnosis not present

## 2015-06-09 DIAGNOSIS — F329 Major depressive disorder, single episode, unspecified: Secondary | ICD-10-CM | POA: Diagnosis not present

## 2015-06-09 DIAGNOSIS — L0390 Cellulitis, unspecified: Secondary | ICD-10-CM | POA: Diagnosis not present

## 2015-06-09 DIAGNOSIS — R768 Other specified abnormal immunological findings in serum: Secondary | ICD-10-CM | POA: Diagnosis not present

## 2015-06-09 DIAGNOSIS — M159 Polyosteoarthritis, unspecified: Secondary | ICD-10-CM | POA: Diagnosis not present

## 2015-06-09 DIAGNOSIS — G5620 Lesion of ulnar nerve, unspecified upper limb: Secondary | ICD-10-CM | POA: Diagnosis not present

## 2015-06-09 DIAGNOSIS — M797 Fibromyalgia: Secondary | ICD-10-CM | POA: Diagnosis not present

## 2015-06-09 DIAGNOSIS — G4700 Insomnia, unspecified: Secondary | ICD-10-CM | POA: Diagnosis not present

## 2015-06-09 DIAGNOSIS — R569 Unspecified convulsions: Secondary | ICD-10-CM | POA: Diagnosis not present

## 2015-06-09 DIAGNOSIS — F419 Anxiety disorder, unspecified: Secondary | ICD-10-CM | POA: Diagnosis not present

## 2015-06-09 DIAGNOSIS — E039 Hypothyroidism, unspecified: Secondary | ICD-10-CM | POA: Diagnosis not present

## 2015-06-09 DIAGNOSIS — R55 Syncope and collapse: Secondary | ICD-10-CM | POA: Diagnosis not present

## 2015-06-09 DIAGNOSIS — K219 Gastro-esophageal reflux disease without esophagitis: Secondary | ICD-10-CM | POA: Diagnosis not present

## 2015-06-23 DIAGNOSIS — R768 Other specified abnormal immunological findings in serum: Secondary | ICD-10-CM | POA: Diagnosis not present

## 2015-06-23 DIAGNOSIS — M791 Myalgia: Secondary | ICD-10-CM | POA: Diagnosis not present

## 2015-06-29 DIAGNOSIS — D550 Anemia due to glucose-6-phosphate dehydrogenase [G6PD] deficiency: Secondary | ICD-10-CM | POA: Diagnosis not present

## 2015-06-29 DIAGNOSIS — M2550 Pain in unspecified joint: Secondary | ICD-10-CM | POA: Diagnosis not present

## 2015-06-29 DIAGNOSIS — R768 Other specified abnormal immunological findings in serum: Secondary | ICD-10-CM | POA: Diagnosis not present

## 2015-07-18 DIAGNOSIS — R55 Syncope and collapse: Secondary | ICD-10-CM | POA: Diagnosis not present

## 2015-07-18 DIAGNOSIS — R404 Transient alteration of awareness: Secondary | ICD-10-CM | POA: Diagnosis not present

## 2015-07-18 DIAGNOSIS — R51 Headache: Secondary | ICD-10-CM | POA: Diagnosis not present

## 2015-08-06 DIAGNOSIS — M940 Chondrocostal junction syndrome [Tietze]: Secondary | ICD-10-CM | POA: Diagnosis not present

## 2015-08-29 DIAGNOSIS — Z1231 Encounter for screening mammogram for malignant neoplasm of breast: Secondary | ICD-10-CM | POA: Diagnosis not present

## 2015-09-12 DIAGNOSIS — G4700 Insomnia, unspecified: Secondary | ICD-10-CM | POA: Diagnosis not present

## 2015-09-12 DIAGNOSIS — G5620 Lesion of ulnar nerve, unspecified upper limb: Secondary | ICD-10-CM | POA: Diagnosis not present

## 2015-09-12 DIAGNOSIS — E669 Obesity, unspecified: Secondary | ICD-10-CM | POA: Diagnosis not present

## 2015-09-12 DIAGNOSIS — M159 Polyosteoarthritis, unspecified: Secondary | ICD-10-CM | POA: Diagnosis not present

## 2015-09-12 DIAGNOSIS — F419 Anxiety disorder, unspecified: Secondary | ICD-10-CM | POA: Diagnosis not present

## 2015-09-12 DIAGNOSIS — F329 Major depressive disorder, single episode, unspecified: Secondary | ICD-10-CM | POA: Diagnosis not present

## 2015-09-12 DIAGNOSIS — K219 Gastro-esophageal reflux disease without esophagitis: Secondary | ICD-10-CM | POA: Diagnosis not present

## 2015-09-12 DIAGNOSIS — M797 Fibromyalgia: Secondary | ICD-10-CM | POA: Diagnosis not present

## 2015-09-12 DIAGNOSIS — G4762 Sleep related leg cramps: Secondary | ICD-10-CM | POA: Diagnosis not present

## 2015-09-12 DIAGNOSIS — R569 Unspecified convulsions: Secondary | ICD-10-CM | POA: Diagnosis not present

## 2015-09-12 DIAGNOSIS — E039 Hypothyroidism, unspecified: Secondary | ICD-10-CM | POA: Diagnosis not present

## 2015-09-12 DIAGNOSIS — R55 Syncope and collapse: Secondary | ICD-10-CM | POA: Diagnosis not present

## 2015-09-25 DIAGNOSIS — M2550 Pain in unspecified joint: Secondary | ICD-10-CM | POA: Diagnosis not present

## 2015-09-25 DIAGNOSIS — D550 Anemia due to glucose-6-phosphate dehydrogenase [G6PD] deficiency: Secondary | ICD-10-CM | POA: Diagnosis not present

## 2015-09-25 DIAGNOSIS — R768 Other specified abnormal immunological findings in serum: Secondary | ICD-10-CM | POA: Diagnosis not present

## 2016-01-01 DIAGNOSIS — R55 Syncope and collapse: Secondary | ICD-10-CM

## 2016-01-01 DIAGNOSIS — R000 Tachycardia, unspecified: Secondary | ICD-10-CM | POA: Diagnosis not present

## 2016-01-01 HISTORY — DX: Syncope and collapse: R55

## 2016-01-08 DIAGNOSIS — R072 Precordial pain: Secondary | ICD-10-CM | POA: Diagnosis not present

## 2016-01-08 DIAGNOSIS — R079 Chest pain, unspecified: Secondary | ICD-10-CM | POA: Diagnosis not present

## 2016-01-18 DIAGNOSIS — R000 Tachycardia, unspecified: Secondary | ICD-10-CM | POA: Diagnosis not present

## 2016-01-18 DIAGNOSIS — R55 Syncope and collapse: Secondary | ICD-10-CM | POA: Diagnosis not present

## 2016-01-20 DIAGNOSIS — G4762 Sleep related leg cramps: Secondary | ICD-10-CM | POA: Diagnosis not present

## 2016-01-20 DIAGNOSIS — G5620 Lesion of ulnar nerve, unspecified upper limb: Secondary | ICD-10-CM | POA: Diagnosis not present

## 2016-01-20 DIAGNOSIS — E039 Hypothyroidism, unspecified: Secondary | ICD-10-CM | POA: Diagnosis not present

## 2016-01-20 DIAGNOSIS — G4700 Insomnia, unspecified: Secondary | ICD-10-CM | POA: Diagnosis not present

## 2016-01-20 DIAGNOSIS — K219 Gastro-esophageal reflux disease without esophagitis: Secondary | ICD-10-CM | POA: Diagnosis not present

## 2016-01-20 DIAGNOSIS — E669 Obesity, unspecified: Secondary | ICD-10-CM | POA: Diagnosis not present

## 2016-01-20 DIAGNOSIS — R569 Unspecified convulsions: Secondary | ICD-10-CM | POA: Diagnosis not present

## 2016-01-20 DIAGNOSIS — M159 Polyosteoarthritis, unspecified: Secondary | ICD-10-CM | POA: Diagnosis not present

## 2016-01-20 DIAGNOSIS — F419 Anxiety disorder, unspecified: Secondary | ICD-10-CM | POA: Diagnosis not present

## 2016-01-20 DIAGNOSIS — F329 Major depressive disorder, single episode, unspecified: Secondary | ICD-10-CM | POA: Diagnosis not present

## 2016-01-20 DIAGNOSIS — R55 Syncope and collapse: Secondary | ICD-10-CM | POA: Diagnosis not present

## 2016-01-20 DIAGNOSIS — M797 Fibromyalgia: Secondary | ICD-10-CM | POA: Diagnosis not present

## 2016-01-25 DIAGNOSIS — R55 Syncope and collapse: Secondary | ICD-10-CM | POA: Diagnosis not present

## 2016-01-25 DIAGNOSIS — R000 Tachycardia, unspecified: Secondary | ICD-10-CM | POA: Diagnosis not present

## 2016-01-29 DIAGNOSIS — R55 Syncope and collapse: Secondary | ICD-10-CM | POA: Diagnosis not present

## 2016-01-31 DIAGNOSIS — F329 Major depressive disorder, single episode, unspecified: Secondary | ICD-10-CM | POA: Diagnosis not present

## 2016-01-31 DIAGNOSIS — M797 Fibromyalgia: Secondary | ICD-10-CM | POA: Diagnosis not present

## 2016-01-31 DIAGNOSIS — R569 Unspecified convulsions: Secondary | ICD-10-CM | POA: Diagnosis not present

## 2016-01-31 DIAGNOSIS — G4762 Sleep related leg cramps: Secondary | ICD-10-CM | POA: Diagnosis not present

## 2016-01-31 DIAGNOSIS — K219 Gastro-esophageal reflux disease without esophagitis: Secondary | ICD-10-CM | POA: Diagnosis not present

## 2016-01-31 DIAGNOSIS — R55 Syncope and collapse: Secondary | ICD-10-CM | POA: Diagnosis not present

## 2016-01-31 DIAGNOSIS — R768 Other specified abnormal immunological findings in serum: Secondary | ICD-10-CM | POA: Diagnosis not present

## 2016-01-31 DIAGNOSIS — E039 Hypothyroidism, unspecified: Secondary | ICD-10-CM | POA: Diagnosis not present

## 2016-01-31 DIAGNOSIS — F419 Anxiety disorder, unspecified: Secondary | ICD-10-CM | POA: Diagnosis not present

## 2016-01-31 DIAGNOSIS — G5620 Lesion of ulnar nerve, unspecified upper limb: Secondary | ICD-10-CM | POA: Diagnosis not present

## 2016-01-31 DIAGNOSIS — G4700 Insomnia, unspecified: Secondary | ICD-10-CM | POA: Diagnosis not present

## 2016-01-31 DIAGNOSIS — L309 Dermatitis, unspecified: Secondary | ICD-10-CM | POA: Diagnosis not present

## 2016-01-31 DIAGNOSIS — R000 Tachycardia, unspecified: Secondary | ICD-10-CM | POA: Diagnosis not present

## 2016-02-07 DIAGNOSIS — R768 Other specified abnormal immunological findings in serum: Secondary | ICD-10-CM | POA: Diagnosis not present

## 2016-02-07 DIAGNOSIS — G5620 Lesion of ulnar nerve, unspecified upper limb: Secondary | ICD-10-CM | POA: Diagnosis not present

## 2016-02-07 DIAGNOSIS — F419 Anxiety disorder, unspecified: Secondary | ICD-10-CM | POA: Diagnosis not present

## 2016-02-07 DIAGNOSIS — K219 Gastro-esophageal reflux disease without esophagitis: Secondary | ICD-10-CM | POA: Diagnosis not present

## 2016-02-07 DIAGNOSIS — G4700 Insomnia, unspecified: Secondary | ICD-10-CM | POA: Diagnosis not present

## 2016-02-07 DIAGNOSIS — F329 Major depressive disorder, single episode, unspecified: Secondary | ICD-10-CM | POA: Diagnosis not present

## 2016-02-07 DIAGNOSIS — R55 Syncope and collapse: Secondary | ICD-10-CM | POA: Diagnosis not present

## 2016-02-07 DIAGNOSIS — J028 Acute pharyngitis due to other specified organisms: Secondary | ICD-10-CM | POA: Diagnosis not present

## 2016-02-07 DIAGNOSIS — E039 Hypothyroidism, unspecified: Secondary | ICD-10-CM | POA: Diagnosis not present

## 2016-02-07 DIAGNOSIS — G4762 Sleep related leg cramps: Secondary | ICD-10-CM | POA: Diagnosis not present

## 2016-02-07 DIAGNOSIS — M797 Fibromyalgia: Secondary | ICD-10-CM | POA: Diagnosis not present

## 2016-02-07 DIAGNOSIS — R569 Unspecified convulsions: Secondary | ICD-10-CM | POA: Diagnosis not present

## 2016-02-10 DIAGNOSIS — R0602 Shortness of breath: Secondary | ICD-10-CM | POA: Diagnosis not present

## 2016-02-10 DIAGNOSIS — R05 Cough: Secondary | ICD-10-CM | POA: Diagnosis not present

## 2016-02-10 DIAGNOSIS — R112 Nausea with vomiting, unspecified: Secondary | ICD-10-CM | POA: Diagnosis not present

## 2016-02-10 DIAGNOSIS — R000 Tachycardia, unspecified: Secondary | ICD-10-CM | POA: Diagnosis not present

## 2016-02-14 DIAGNOSIS — R55 Syncope and collapse: Secondary | ICD-10-CM | POA: Diagnosis not present

## 2016-02-14 DIAGNOSIS — G4700 Insomnia, unspecified: Secondary | ICD-10-CM | POA: Diagnosis not present

## 2016-02-14 DIAGNOSIS — G4762 Sleep related leg cramps: Secondary | ICD-10-CM | POA: Diagnosis not present

## 2016-02-14 DIAGNOSIS — F419 Anxiety disorder, unspecified: Secondary | ICD-10-CM | POA: Diagnosis not present

## 2016-02-14 DIAGNOSIS — E669 Obesity, unspecified: Secondary | ICD-10-CM | POA: Diagnosis not present

## 2016-02-14 DIAGNOSIS — F329 Major depressive disorder, single episode, unspecified: Secondary | ICD-10-CM | POA: Diagnosis not present

## 2016-02-14 DIAGNOSIS — R569 Unspecified convulsions: Secondary | ICD-10-CM | POA: Diagnosis not present

## 2016-02-14 DIAGNOSIS — K219 Gastro-esophageal reflux disease without esophagitis: Secondary | ICD-10-CM | POA: Diagnosis not present

## 2016-02-14 DIAGNOSIS — G5620 Lesion of ulnar nerve, unspecified upper limb: Secondary | ICD-10-CM | POA: Diagnosis not present

## 2016-02-14 DIAGNOSIS — E039 Hypothyroidism, unspecified: Secondary | ICD-10-CM | POA: Diagnosis not present

## 2016-02-14 DIAGNOSIS — M797 Fibromyalgia: Secondary | ICD-10-CM | POA: Diagnosis not present

## 2016-02-14 DIAGNOSIS — J028 Acute pharyngitis due to other specified organisms: Secondary | ICD-10-CM | POA: Diagnosis not present

## 2016-02-28 DIAGNOSIS — G8929 Other chronic pain: Secondary | ICD-10-CM | POA: Diagnosis not present

## 2016-03-13 DIAGNOSIS — R040 Epistaxis: Secondary | ICD-10-CM

## 2016-03-13 DIAGNOSIS — R768 Other specified abnormal immunological findings in serum: Secondary | ICD-10-CM

## 2016-03-13 DIAGNOSIS — G4700 Insomnia, unspecified: Secondary | ICD-10-CM | POA: Insufficient documentation

## 2016-03-13 DIAGNOSIS — O210 Mild hyperemesis gravidarum: Secondary | ICD-10-CM

## 2016-03-13 DIAGNOSIS — J029 Acute pharyngitis, unspecified: Secondary | ICD-10-CM

## 2016-03-13 DIAGNOSIS — N83209 Unspecified ovarian cyst, unspecified side: Secondary | ICD-10-CM | POA: Insufficient documentation

## 2016-03-13 DIAGNOSIS — M2550 Pain in unspecified joint: Secondary | ICD-10-CM

## 2016-03-13 DIAGNOSIS — D75A Glucose-6-phosphate dehydrogenase (G6PD) deficiency without anemia: Secondary | ICD-10-CM

## 2016-03-13 HISTORY — DX: Other specified abnormal immunological findings in serum: R76.8

## 2016-03-13 HISTORY — DX: Unspecified ovarian cyst, unspecified side: N83.209

## 2016-03-13 HISTORY — DX: Pain in unspecified joint: M25.50

## 2016-03-13 HISTORY — DX: Epistaxis: R04.0

## 2016-03-13 HISTORY — DX: Glucose-6-phosphate dehydrogenase (G6PD) deficiency without anemia: D75.A

## 2016-03-13 HISTORY — DX: Mild hyperemesis gravidarum: O21.0

## 2016-03-13 HISTORY — DX: Acute pharyngitis, unspecified: J02.9

## 2016-03-26 DIAGNOSIS — M2550 Pain in unspecified joint: Secondary | ICD-10-CM | POA: Diagnosis not present

## 2016-04-02 DIAGNOSIS — N3001 Acute cystitis with hematuria: Secondary | ICD-10-CM | POA: Diagnosis not present

## 2016-04-02 DIAGNOSIS — N3091 Cystitis, unspecified with hematuria: Secondary | ICD-10-CM | POA: Diagnosis not present

## 2016-04-21 DIAGNOSIS — L2089 Other atopic dermatitis: Secondary | ICD-10-CM | POA: Diagnosis not present

## 2016-05-10 DIAGNOSIS — Z1389 Encounter for screening for other disorder: Secondary | ICD-10-CM | POA: Diagnosis not present

## 2016-05-10 DIAGNOSIS — F329 Major depressive disorder, single episode, unspecified: Secondary | ICD-10-CM | POA: Diagnosis not present

## 2016-05-10 DIAGNOSIS — M797 Fibromyalgia: Secondary | ICD-10-CM | POA: Diagnosis not present

## 2016-05-10 DIAGNOSIS — F419 Anxiety disorder, unspecified: Secondary | ICD-10-CM | POA: Diagnosis not present

## 2016-05-10 DIAGNOSIS — R569 Unspecified convulsions: Secondary | ICD-10-CM | POA: Diagnosis not present

## 2016-05-10 DIAGNOSIS — E039 Hypothyroidism, unspecified: Secondary | ICD-10-CM | POA: Diagnosis not present

## 2016-05-10 DIAGNOSIS — G4762 Sleep related leg cramps: Secondary | ICD-10-CM | POA: Diagnosis not present

## 2016-05-10 DIAGNOSIS — G5620 Lesion of ulnar nerve, unspecified upper limb: Secondary | ICD-10-CM | POA: Diagnosis not present

## 2016-05-10 DIAGNOSIS — M159 Polyosteoarthritis, unspecified: Secondary | ICD-10-CM | POA: Diagnosis not present

## 2016-05-10 DIAGNOSIS — K219 Gastro-esophageal reflux disease without esophagitis: Secondary | ICD-10-CM | POA: Diagnosis not present

## 2016-05-10 DIAGNOSIS — R768 Other specified abnormal immunological findings in serum: Secondary | ICD-10-CM | POA: Diagnosis not present

## 2016-05-10 DIAGNOSIS — G4700 Insomnia, unspecified: Secondary | ICD-10-CM | POA: Diagnosis not present

## 2016-05-17 DIAGNOSIS — M25551 Pain in right hip: Secondary | ICD-10-CM | POA: Diagnosis not present

## 2016-05-17 DIAGNOSIS — M25552 Pain in left hip: Secondary | ICD-10-CM | POA: Diagnosis not present

## 2016-05-17 DIAGNOSIS — R1032 Left lower quadrant pain: Secondary | ICD-10-CM | POA: Diagnosis not present

## 2016-05-17 DIAGNOSIS — M160 Bilateral primary osteoarthritis of hip: Secondary | ICD-10-CM | POA: Diagnosis not present

## 2016-05-23 DIAGNOSIS — R635 Abnormal weight gain: Secondary | ICD-10-CM | POA: Diagnosis not present

## 2016-05-23 DIAGNOSIS — K580 Irritable bowel syndrome with diarrhea: Secondary | ICD-10-CM | POA: Diagnosis not present

## 2016-05-28 DIAGNOSIS — F419 Anxiety disorder, unspecified: Secondary | ICD-10-CM | POA: Diagnosis not present

## 2016-05-28 DIAGNOSIS — K219 Gastro-esophageal reflux disease without esophagitis: Secondary | ICD-10-CM | POA: Diagnosis not present

## 2016-05-28 DIAGNOSIS — R569 Unspecified convulsions: Secondary | ICD-10-CM | POA: Diagnosis not present

## 2016-05-28 DIAGNOSIS — G5620 Lesion of ulnar nerve, unspecified upper limb: Secondary | ICD-10-CM | POA: Diagnosis not present

## 2016-05-28 DIAGNOSIS — F329 Major depressive disorder, single episode, unspecified: Secondary | ICD-10-CM | POA: Diagnosis not present

## 2016-05-28 DIAGNOSIS — M797 Fibromyalgia: Secondary | ICD-10-CM | POA: Diagnosis not present

## 2016-05-28 DIAGNOSIS — L309 Dermatitis, unspecified: Secondary | ICD-10-CM | POA: Diagnosis not present

## 2016-05-28 DIAGNOSIS — G4762 Sleep related leg cramps: Secondary | ICD-10-CM | POA: Diagnosis not present

## 2016-05-28 DIAGNOSIS — E039 Hypothyroidism, unspecified: Secondary | ICD-10-CM | POA: Diagnosis not present

## 2016-05-28 DIAGNOSIS — G4700 Insomnia, unspecified: Secondary | ICD-10-CM | POA: Diagnosis not present

## 2016-05-28 DIAGNOSIS — R768 Other specified abnormal immunological findings in serum: Secondary | ICD-10-CM | POA: Diagnosis not present

## 2016-05-28 DIAGNOSIS — Z1389 Encounter for screening for other disorder: Secondary | ICD-10-CM | POA: Diagnosis not present

## 2016-06-02 DIAGNOSIS — M7750 Other enthesopathy of unspecified foot: Secondary | ICD-10-CM | POA: Diagnosis not present

## 2016-06-02 DIAGNOSIS — M7989 Other specified soft tissue disorders: Secondary | ICD-10-CM | POA: Diagnosis not present

## 2016-06-02 DIAGNOSIS — M778 Other enthesopathies, not elsewhere classified: Secondary | ICD-10-CM | POA: Diagnosis not present

## 2016-06-09 DIAGNOSIS — L255 Unspecified contact dermatitis due to plants, except food: Secondary | ICD-10-CM | POA: Diagnosis not present

## 2016-06-12 DIAGNOSIS — G5602 Carpal tunnel syndrome, left upper limb: Secondary | ICD-10-CM | POA: Diagnosis not present

## 2016-06-12 DIAGNOSIS — M1812 Unilateral primary osteoarthritis of first carpometacarpal joint, left hand: Secondary | ICD-10-CM | POA: Diagnosis not present

## 2016-06-12 DIAGNOSIS — M65312 Trigger thumb, left thumb: Secondary | ICD-10-CM | POA: Diagnosis not present

## 2016-06-24 DIAGNOSIS — G5620 Lesion of ulnar nerve, unspecified upper limb: Secondary | ICD-10-CM | POA: Diagnosis not present

## 2016-06-24 DIAGNOSIS — L309 Dermatitis, unspecified: Secondary | ICD-10-CM | POA: Diagnosis not present

## 2016-06-24 DIAGNOSIS — M797 Fibromyalgia: Secondary | ICD-10-CM | POA: Diagnosis not present

## 2016-06-24 DIAGNOSIS — E039 Hypothyroidism, unspecified: Secondary | ICD-10-CM | POA: Diagnosis not present

## 2016-06-24 DIAGNOSIS — K219 Gastro-esophageal reflux disease without esophagitis: Secondary | ICD-10-CM | POA: Diagnosis not present

## 2016-06-24 DIAGNOSIS — G4762 Sleep related leg cramps: Secondary | ICD-10-CM | POA: Diagnosis not present

## 2016-06-24 DIAGNOSIS — G4700 Insomnia, unspecified: Secondary | ICD-10-CM | POA: Diagnosis not present

## 2016-06-24 DIAGNOSIS — F419 Anxiety disorder, unspecified: Secondary | ICD-10-CM | POA: Diagnosis not present

## 2016-06-24 DIAGNOSIS — R569 Unspecified convulsions: Secondary | ICD-10-CM | POA: Diagnosis not present

## 2016-06-24 DIAGNOSIS — R55 Syncope and collapse: Secondary | ICD-10-CM | POA: Diagnosis not present

## 2016-06-24 DIAGNOSIS — F329 Major depressive disorder, single episode, unspecified: Secondary | ICD-10-CM | POA: Diagnosis not present

## 2016-06-24 DIAGNOSIS — R768 Other specified abnormal immunological findings in serum: Secondary | ICD-10-CM | POA: Diagnosis not present

## 2016-06-26 DIAGNOSIS — Z79899 Other long term (current) drug therapy: Secondary | ICD-10-CM | POA: Diagnosis not present

## 2016-06-29 DIAGNOSIS — M5440 Lumbago with sciatica, unspecified side: Secondary | ICD-10-CM | POA: Diagnosis not present

## 2016-06-29 DIAGNOSIS — M5416 Radiculopathy, lumbar region: Secondary | ICD-10-CM | POA: Diagnosis not present

## 2016-06-29 DIAGNOSIS — M179 Osteoarthritis of knee, unspecified: Secondary | ICD-10-CM | POA: Diagnosis not present

## 2016-06-29 DIAGNOSIS — M169 Osteoarthritis of hip, unspecified: Secondary | ICD-10-CM | POA: Diagnosis not present

## 2016-06-29 DIAGNOSIS — M545 Low back pain: Secondary | ICD-10-CM | POA: Diagnosis not present

## 2016-06-29 DIAGNOSIS — G894 Chronic pain syndrome: Secondary | ICD-10-CM | POA: Diagnosis not present

## 2016-06-29 DIAGNOSIS — M5136 Other intervertebral disc degeneration, lumbar region: Secondary | ICD-10-CM | POA: Diagnosis not present

## 2016-07-03 DIAGNOSIS — Z79899 Other long term (current) drug therapy: Secondary | ICD-10-CM | POA: Diagnosis not present

## 2016-07-04 DIAGNOSIS — L501 Idiopathic urticaria: Secondary | ICD-10-CM | POA: Diagnosis not present

## 2016-07-08 DIAGNOSIS — M1812 Unilateral primary osteoarthritis of first carpometacarpal joint, left hand: Secondary | ICD-10-CM | POA: Diagnosis not present

## 2016-07-08 DIAGNOSIS — G5602 Carpal tunnel syndrome, left upper limb: Secondary | ICD-10-CM | POA: Diagnosis not present

## 2016-07-10 DIAGNOSIS — M5442 Lumbago with sciatica, left side: Secondary | ICD-10-CM | POA: Diagnosis not present

## 2016-07-10 DIAGNOSIS — G8929 Other chronic pain: Secondary | ICD-10-CM | POA: Diagnosis not present

## 2016-07-10 DIAGNOSIS — M5416 Radiculopathy, lumbar region: Secondary | ICD-10-CM | POA: Diagnosis not present

## 2016-08-19 DIAGNOSIS — R1013 Epigastric pain: Secondary | ICD-10-CM | POA: Diagnosis not present

## 2016-08-21 DIAGNOSIS — L299 Pruritus, unspecified: Secondary | ICD-10-CM | POA: Diagnosis not present

## 2016-08-21 DIAGNOSIS — Z79899 Other long term (current) drug therapy: Secondary | ICD-10-CM | POA: Diagnosis not present

## 2016-08-21 DIAGNOSIS — M545 Low back pain: Secondary | ICD-10-CM | POA: Diagnosis not present

## 2016-08-21 DIAGNOSIS — M5440 Lumbago with sciatica, unspecified side: Secondary | ICD-10-CM | POA: Diagnosis not present

## 2016-08-21 DIAGNOSIS — L501 Idiopathic urticaria: Secondary | ICD-10-CM | POA: Diagnosis not present

## 2016-08-26 DIAGNOSIS — R1013 Epigastric pain: Secondary | ICD-10-CM | POA: Diagnosis not present

## 2016-09-03 DIAGNOSIS — L501 Idiopathic urticaria: Secondary | ICD-10-CM | POA: Diagnosis not present

## 2016-09-09 DIAGNOSIS — L501 Idiopathic urticaria: Secondary | ICD-10-CM | POA: Diagnosis not present

## 2016-09-18 DIAGNOSIS — L300 Nummular dermatitis: Secondary | ICD-10-CM | POA: Diagnosis not present

## 2016-09-21 DIAGNOSIS — Z1231 Encounter for screening mammogram for malignant neoplasm of breast: Secondary | ICD-10-CM | POA: Diagnosis not present

## 2016-09-26 DIAGNOSIS — M2550 Pain in unspecified joint: Secondary | ICD-10-CM | POA: Diagnosis not present

## 2016-09-26 DIAGNOSIS — D550 Anemia due to glucose-6-phosphate dehydrogenase [G6PD] deficiency: Secondary | ICD-10-CM | POA: Diagnosis not present

## 2016-10-07 DIAGNOSIS — M797 Fibromyalgia: Secondary | ICD-10-CM | POA: Diagnosis not present

## 2016-10-07 DIAGNOSIS — G4700 Insomnia, unspecified: Secondary | ICD-10-CM | POA: Diagnosis not present

## 2016-10-07 DIAGNOSIS — R768 Other specified abnormal immunological findings in serum: Secondary | ICD-10-CM | POA: Diagnosis not present

## 2016-10-07 DIAGNOSIS — R55 Syncope and collapse: Secondary | ICD-10-CM | POA: Diagnosis not present

## 2016-10-07 DIAGNOSIS — F329 Major depressive disorder, single episode, unspecified: Secondary | ICD-10-CM | POA: Diagnosis not present

## 2016-10-07 DIAGNOSIS — R569 Unspecified convulsions: Secondary | ICD-10-CM | POA: Diagnosis not present

## 2016-10-07 DIAGNOSIS — E039 Hypothyroidism, unspecified: Secondary | ICD-10-CM | POA: Diagnosis not present

## 2016-10-07 DIAGNOSIS — G5620 Lesion of ulnar nerve, unspecified upper limb: Secondary | ICD-10-CM | POA: Diagnosis not present

## 2016-10-07 DIAGNOSIS — G4762 Sleep related leg cramps: Secondary | ICD-10-CM | POA: Diagnosis not present

## 2016-10-07 DIAGNOSIS — F419 Anxiety disorder, unspecified: Secondary | ICD-10-CM | POA: Diagnosis not present

## 2016-10-07 DIAGNOSIS — K219 Gastro-esophageal reflux disease without esophagitis: Secondary | ICD-10-CM | POA: Diagnosis not present

## 2016-10-07 DIAGNOSIS — J028 Acute pharyngitis due to other specified organisms: Secondary | ICD-10-CM | POA: Diagnosis not present

## 2016-10-28 DIAGNOSIS — R768 Other specified abnormal immunological findings in serum: Secondary | ICD-10-CM | POA: Diagnosis not present

## 2016-10-28 DIAGNOSIS — M159 Polyosteoarthritis, unspecified: Secondary | ICD-10-CM | POA: Diagnosis not present

## 2016-10-28 DIAGNOSIS — G5620 Lesion of ulnar nerve, unspecified upper limb: Secondary | ICD-10-CM | POA: Diagnosis not present

## 2016-10-28 DIAGNOSIS — K219 Gastro-esophageal reflux disease without esophagitis: Secondary | ICD-10-CM | POA: Diagnosis not present

## 2016-10-28 DIAGNOSIS — G4700 Insomnia, unspecified: Secondary | ICD-10-CM | POA: Diagnosis not present

## 2016-10-28 DIAGNOSIS — G4762 Sleep related leg cramps: Secondary | ICD-10-CM | POA: Diagnosis not present

## 2016-10-28 DIAGNOSIS — M797 Fibromyalgia: Secondary | ICD-10-CM | POA: Diagnosis not present

## 2016-10-28 DIAGNOSIS — F329 Major depressive disorder, single episode, unspecified: Secondary | ICD-10-CM | POA: Diagnosis not present

## 2016-10-28 DIAGNOSIS — R51 Headache: Secondary | ICD-10-CM | POA: Diagnosis not present

## 2016-10-28 DIAGNOSIS — R569 Unspecified convulsions: Secondary | ICD-10-CM | POA: Diagnosis not present

## 2016-10-28 DIAGNOSIS — F419 Anxiety disorder, unspecified: Secondary | ICD-10-CM | POA: Diagnosis not present

## 2016-10-28 DIAGNOSIS — E039 Hypothyroidism, unspecified: Secondary | ICD-10-CM | POA: Diagnosis not present

## 2016-10-30 DIAGNOSIS — L300 Nummular dermatitis: Secondary | ICD-10-CM | POA: Diagnosis not present

## 2016-11-01 DIAGNOSIS — L501 Idiopathic urticaria: Secondary | ICD-10-CM | POA: Diagnosis not present

## 2016-11-06 DIAGNOSIS — K219 Gastro-esophageal reflux disease without esophagitis: Secondary | ICD-10-CM | POA: Diagnosis not present

## 2016-11-06 DIAGNOSIS — R768 Other specified abnormal immunological findings in serum: Secondary | ICD-10-CM | POA: Diagnosis not present

## 2016-11-06 DIAGNOSIS — G5620 Lesion of ulnar nerve, unspecified upper limb: Secondary | ICD-10-CM | POA: Diagnosis not present

## 2016-11-06 DIAGNOSIS — F419 Anxiety disorder, unspecified: Secondary | ICD-10-CM | POA: Diagnosis not present

## 2016-11-06 DIAGNOSIS — E039 Hypothyroidism, unspecified: Secondary | ICD-10-CM | POA: Diagnosis not present

## 2016-11-06 DIAGNOSIS — G4762 Sleep related leg cramps: Secondary | ICD-10-CM | POA: Diagnosis not present

## 2016-11-06 DIAGNOSIS — G4700 Insomnia, unspecified: Secondary | ICD-10-CM | POA: Diagnosis not present

## 2016-11-06 DIAGNOSIS — Z23 Encounter for immunization: Secondary | ICD-10-CM | POA: Diagnosis not present

## 2016-11-06 DIAGNOSIS — F329 Major depressive disorder, single episode, unspecified: Secondary | ICD-10-CM | POA: Diagnosis not present

## 2016-11-06 DIAGNOSIS — R569 Unspecified convulsions: Secondary | ICD-10-CM | POA: Diagnosis not present

## 2016-11-06 DIAGNOSIS — I10 Essential (primary) hypertension: Secondary | ICD-10-CM | POA: Diagnosis not present

## 2016-11-06 DIAGNOSIS — M797 Fibromyalgia: Secondary | ICD-10-CM | POA: Diagnosis not present

## 2016-11-21 DIAGNOSIS — R42 Dizziness and giddiness: Secondary | ICD-10-CM | POA: Diagnosis not present

## 2016-11-21 DIAGNOSIS — F419 Anxiety disorder, unspecified: Secondary | ICD-10-CM | POA: Diagnosis not present

## 2016-11-21 DIAGNOSIS — K219 Gastro-esophageal reflux disease without esophagitis: Secondary | ICD-10-CM | POA: Diagnosis not present

## 2016-11-21 DIAGNOSIS — F329 Major depressive disorder, single episode, unspecified: Secondary | ICD-10-CM | POA: Diagnosis not present

## 2016-11-21 DIAGNOSIS — I10 Essential (primary) hypertension: Secondary | ICD-10-CM | POA: Diagnosis not present

## 2016-11-21 DIAGNOSIS — R768 Other specified abnormal immunological findings in serum: Secondary | ICD-10-CM | POA: Diagnosis not present

## 2016-11-21 DIAGNOSIS — R569 Unspecified convulsions: Secondary | ICD-10-CM | POA: Diagnosis not present

## 2016-11-21 DIAGNOSIS — E039 Hypothyroidism, unspecified: Secondary | ICD-10-CM | POA: Diagnosis not present

## 2016-11-21 DIAGNOSIS — M797 Fibromyalgia: Secondary | ICD-10-CM | POA: Diagnosis not present

## 2016-11-21 DIAGNOSIS — G5620 Lesion of ulnar nerve, unspecified upper limb: Secondary | ICD-10-CM | POA: Diagnosis not present

## 2016-11-21 DIAGNOSIS — G4762 Sleep related leg cramps: Secondary | ICD-10-CM | POA: Diagnosis not present

## 2016-11-21 DIAGNOSIS — G4700 Insomnia, unspecified: Secondary | ICD-10-CM | POA: Diagnosis not present

## 2016-12-11 DIAGNOSIS — L719 Rosacea, unspecified: Secondary | ICD-10-CM | POA: Diagnosis not present

## 2016-12-11 DIAGNOSIS — R531 Weakness: Secondary | ICD-10-CM | POA: Diagnosis not present

## 2016-12-11 DIAGNOSIS — L501 Idiopathic urticaria: Secondary | ICD-10-CM | POA: Diagnosis not present

## 2016-12-19 DIAGNOSIS — G4762 Sleep related leg cramps: Secondary | ICD-10-CM | POA: Diagnosis not present

## 2016-12-19 DIAGNOSIS — R768 Other specified abnormal immunological findings in serum: Secondary | ICD-10-CM | POA: Diagnosis not present

## 2016-12-19 DIAGNOSIS — M159 Polyosteoarthritis, unspecified: Secondary | ICD-10-CM | POA: Diagnosis not present

## 2016-12-19 DIAGNOSIS — R569 Unspecified convulsions: Secondary | ICD-10-CM | POA: Diagnosis not present

## 2016-12-19 DIAGNOSIS — M797 Fibromyalgia: Secondary | ICD-10-CM | POA: Diagnosis not present

## 2016-12-19 DIAGNOSIS — F329 Major depressive disorder, single episode, unspecified: Secondary | ICD-10-CM | POA: Diagnosis not present

## 2016-12-19 DIAGNOSIS — G5620 Lesion of ulnar nerve, unspecified upper limb: Secondary | ICD-10-CM | POA: Diagnosis not present

## 2016-12-19 DIAGNOSIS — G4700 Insomnia, unspecified: Secondary | ICD-10-CM | POA: Diagnosis not present

## 2016-12-19 DIAGNOSIS — F419 Anxiety disorder, unspecified: Secondary | ICD-10-CM | POA: Diagnosis not present

## 2016-12-19 DIAGNOSIS — K219 Gastro-esophageal reflux disease without esophagitis: Secondary | ICD-10-CM | POA: Diagnosis not present

## 2016-12-19 DIAGNOSIS — E039 Hypothyroidism, unspecified: Secondary | ICD-10-CM | POA: Diagnosis not present

## 2016-12-19 DIAGNOSIS — R5383 Other fatigue: Secondary | ICD-10-CM | POA: Diagnosis not present

## 2017-01-02 DIAGNOSIS — G4700 Insomnia, unspecified: Secondary | ICD-10-CM | POA: Diagnosis not present

## 2017-01-02 DIAGNOSIS — F329 Major depressive disorder, single episode, unspecified: Secondary | ICD-10-CM | POA: Diagnosis not present

## 2017-01-02 DIAGNOSIS — K219 Gastro-esophageal reflux disease without esophagitis: Secondary | ICD-10-CM | POA: Diagnosis not present

## 2017-01-02 DIAGNOSIS — M2550 Pain in unspecified joint: Secondary | ICD-10-CM | POA: Diagnosis not present

## 2017-01-02 DIAGNOSIS — R51 Headache: Secondary | ICD-10-CM | POA: Diagnosis not present

## 2017-01-02 DIAGNOSIS — M797 Fibromyalgia: Secondary | ICD-10-CM | POA: Diagnosis not present

## 2017-01-02 DIAGNOSIS — F419 Anxiety disorder, unspecified: Secondary | ICD-10-CM | POA: Diagnosis not present

## 2017-01-02 DIAGNOSIS — R55 Syncope and collapse: Secondary | ICD-10-CM | POA: Diagnosis not present

## 2017-01-02 DIAGNOSIS — D550 Anemia due to glucose-6-phosphate dehydrogenase [G6PD] deficiency: Secondary | ICD-10-CM | POA: Diagnosis not present

## 2017-01-02 DIAGNOSIS — G5620 Lesion of ulnar nerve, unspecified upper limb: Secondary | ICD-10-CM | POA: Diagnosis not present

## 2017-01-02 DIAGNOSIS — M159 Polyosteoarthritis, unspecified: Secondary | ICD-10-CM | POA: Diagnosis not present

## 2017-01-02 DIAGNOSIS — G4762 Sleep related leg cramps: Secondary | ICD-10-CM | POA: Diagnosis not present

## 2017-01-02 DIAGNOSIS — E039 Hypothyroidism, unspecified: Secondary | ICD-10-CM | POA: Diagnosis not present

## 2017-01-02 DIAGNOSIS — R768 Other specified abnormal immunological findings in serum: Secondary | ICD-10-CM | POA: Diagnosis not present

## 2017-01-30 DIAGNOSIS — M797 Fibromyalgia: Secondary | ICD-10-CM | POA: Diagnosis not present

## 2017-01-30 DIAGNOSIS — F329 Major depressive disorder, single episode, unspecified: Secondary | ICD-10-CM | POA: Diagnosis not present

## 2017-01-30 DIAGNOSIS — G4700 Insomnia, unspecified: Secondary | ICD-10-CM | POA: Diagnosis not present

## 2017-01-30 DIAGNOSIS — F419 Anxiety disorder, unspecified: Secondary | ICD-10-CM | POA: Diagnosis not present

## 2017-01-30 DIAGNOSIS — R55 Syncope and collapse: Secondary | ICD-10-CM | POA: Diagnosis not present

## 2017-01-30 DIAGNOSIS — E039 Hypothyroidism, unspecified: Secondary | ICD-10-CM | POA: Diagnosis not present

## 2017-01-30 DIAGNOSIS — G5620 Lesion of ulnar nerve, unspecified upper limb: Secondary | ICD-10-CM | POA: Diagnosis not present

## 2017-01-30 DIAGNOSIS — R51 Headache: Secondary | ICD-10-CM | POA: Diagnosis not present

## 2017-01-30 DIAGNOSIS — R768 Other specified abnormal immunological findings in serum: Secondary | ICD-10-CM | POA: Diagnosis not present

## 2017-01-30 DIAGNOSIS — M159 Polyosteoarthritis, unspecified: Secondary | ICD-10-CM | POA: Diagnosis not present

## 2017-01-30 DIAGNOSIS — K219 Gastro-esophageal reflux disease without esophagitis: Secondary | ICD-10-CM | POA: Diagnosis not present

## 2017-01-30 DIAGNOSIS — G4762 Sleep related leg cramps: Secondary | ICD-10-CM | POA: Diagnosis not present

## 2017-02-04 DIAGNOSIS — R109 Unspecified abdominal pain: Secondary | ICD-10-CM | POA: Diagnosis not present

## 2017-02-04 DIAGNOSIS — N23 Unspecified renal colic: Secondary | ICD-10-CM | POA: Diagnosis not present

## 2017-02-13 DIAGNOSIS — N281 Cyst of kidney, acquired: Secondary | ICD-10-CM | POA: Diagnosis not present

## 2017-02-13 DIAGNOSIS — M549 Dorsalgia, unspecified: Secondary | ICD-10-CM | POA: Diagnosis not present

## 2017-02-13 DIAGNOSIS — K5900 Constipation, unspecified: Secondary | ICD-10-CM | POA: Diagnosis not present

## 2017-03-03 DIAGNOSIS — G4700 Insomnia, unspecified: Secondary | ICD-10-CM | POA: Diagnosis not present

## 2017-03-03 DIAGNOSIS — M797 Fibromyalgia: Secondary | ICD-10-CM | POA: Diagnosis not present

## 2017-03-03 DIAGNOSIS — M159 Polyosteoarthritis, unspecified: Secondary | ICD-10-CM | POA: Diagnosis not present

## 2017-03-03 DIAGNOSIS — F419 Anxiety disorder, unspecified: Secondary | ICD-10-CM | POA: Diagnosis not present

## 2017-03-03 DIAGNOSIS — G5620 Lesion of ulnar nerve, unspecified upper limb: Secondary | ICD-10-CM | POA: Diagnosis not present

## 2017-03-03 DIAGNOSIS — E039 Hypothyroidism, unspecified: Secondary | ICD-10-CM | POA: Diagnosis not present

## 2017-03-03 DIAGNOSIS — K219 Gastro-esophageal reflux disease without esophagitis: Secondary | ICD-10-CM | POA: Diagnosis not present

## 2017-03-03 DIAGNOSIS — F329 Major depressive disorder, single episode, unspecified: Secondary | ICD-10-CM | POA: Diagnosis not present

## 2017-03-03 DIAGNOSIS — R55 Syncope and collapse: Secondary | ICD-10-CM | POA: Diagnosis not present

## 2017-03-03 DIAGNOSIS — R768 Other specified abnormal immunological findings in serum: Secondary | ICD-10-CM | POA: Diagnosis not present

## 2017-03-03 DIAGNOSIS — G4762 Sleep related leg cramps: Secondary | ICD-10-CM | POA: Diagnosis not present

## 2017-03-03 DIAGNOSIS — I10 Essential (primary) hypertension: Secondary | ICD-10-CM | POA: Diagnosis not present

## 2017-03-10 DIAGNOSIS — L501 Idiopathic urticaria: Secondary | ICD-10-CM | POA: Diagnosis not present

## 2017-03-27 DIAGNOSIS — L300 Nummular dermatitis: Secondary | ICD-10-CM | POA: Diagnosis not present

## 2017-03-27 DIAGNOSIS — L299 Pruritus, unspecified: Secondary | ICD-10-CM | POA: Diagnosis not present

## 2017-03-28 DIAGNOSIS — Z136 Encounter for screening for cardiovascular disorders: Secondary | ICD-10-CM | POA: Diagnosis not present

## 2017-03-28 DIAGNOSIS — Z6837 Body mass index (BMI) 37.0-37.9, adult: Secondary | ICD-10-CM | POA: Diagnosis not present

## 2017-03-28 DIAGNOSIS — Z1389 Encounter for screening for other disorder: Secondary | ICD-10-CM | POA: Diagnosis not present

## 2017-03-28 DIAGNOSIS — Z0000 Encounter for general adult medical examination without abnormal findings: Secondary | ICD-10-CM | POA: Diagnosis not present

## 2017-03-28 DIAGNOSIS — Z0100 Encounter for examination of eyes and vision without abnormal findings: Secondary | ICD-10-CM | POA: Diagnosis not present

## 2017-03-28 DIAGNOSIS — Z9181 History of falling: Secondary | ICD-10-CM | POA: Diagnosis not present

## 2017-04-02 DIAGNOSIS — G4700 Insomnia, unspecified: Secondary | ICD-10-CM | POA: Diagnosis not present

## 2017-04-02 DIAGNOSIS — F329 Major depressive disorder, single episode, unspecified: Secondary | ICD-10-CM | POA: Diagnosis not present

## 2017-04-02 DIAGNOSIS — R51 Headache: Secondary | ICD-10-CM | POA: Diagnosis not present

## 2017-04-02 DIAGNOSIS — F419 Anxiety disorder, unspecified: Secondary | ICD-10-CM | POA: Diagnosis not present

## 2017-04-02 DIAGNOSIS — K219 Gastro-esophageal reflux disease without esophagitis: Secondary | ICD-10-CM | POA: Diagnosis not present

## 2017-04-02 DIAGNOSIS — I10 Essential (primary) hypertension: Secondary | ICD-10-CM | POA: Diagnosis not present

## 2017-04-02 DIAGNOSIS — G5620 Lesion of ulnar nerve, unspecified upper limb: Secondary | ICD-10-CM | POA: Diagnosis not present

## 2017-04-02 DIAGNOSIS — M159 Polyosteoarthritis, unspecified: Secondary | ICD-10-CM | POA: Diagnosis not present

## 2017-04-02 DIAGNOSIS — R768 Other specified abnormal immunological findings in serum: Secondary | ICD-10-CM | POA: Diagnosis not present

## 2017-04-02 DIAGNOSIS — R569 Unspecified convulsions: Secondary | ICD-10-CM | POA: Diagnosis not present

## 2017-04-02 DIAGNOSIS — G4762 Sleep related leg cramps: Secondary | ICD-10-CM | POA: Diagnosis not present

## 2017-04-02 DIAGNOSIS — R55 Syncope and collapse: Secondary | ICD-10-CM | POA: Diagnosis not present

## 2017-04-11 DIAGNOSIS — R768 Other specified abnormal immunological findings in serum: Secondary | ICD-10-CM | POA: Diagnosis not present

## 2017-04-11 DIAGNOSIS — F329 Major depressive disorder, single episode, unspecified: Secondary | ICD-10-CM | POA: Diagnosis not present

## 2017-04-11 DIAGNOSIS — R55 Syncope and collapse: Secondary | ICD-10-CM | POA: Diagnosis not present

## 2017-04-11 DIAGNOSIS — F419 Anxiety disorder, unspecified: Secondary | ICD-10-CM | POA: Diagnosis not present

## 2017-04-11 DIAGNOSIS — R51 Headache: Secondary | ICD-10-CM | POA: Diagnosis not present

## 2017-04-11 DIAGNOSIS — M159 Polyosteoarthritis, unspecified: Secondary | ICD-10-CM | POA: Diagnosis not present

## 2017-04-11 DIAGNOSIS — I10 Essential (primary) hypertension: Secondary | ICD-10-CM | POA: Diagnosis not present

## 2017-04-11 DIAGNOSIS — R569 Unspecified convulsions: Secondary | ICD-10-CM | POA: Diagnosis not present

## 2017-04-11 DIAGNOSIS — G4762 Sleep related leg cramps: Secondary | ICD-10-CM | POA: Diagnosis not present

## 2017-04-11 DIAGNOSIS — G5620 Lesion of ulnar nerve, unspecified upper limb: Secondary | ICD-10-CM | POA: Diagnosis not present

## 2017-04-11 DIAGNOSIS — M797 Fibromyalgia: Secondary | ICD-10-CM | POA: Diagnosis not present

## 2017-04-11 DIAGNOSIS — K219 Gastro-esophageal reflux disease without esophagitis: Secondary | ICD-10-CM | POA: Diagnosis not present

## 2017-04-25 DIAGNOSIS — R55 Syncope and collapse: Secondary | ICD-10-CM | POA: Diagnosis not present

## 2017-04-25 DIAGNOSIS — F329 Major depressive disorder, single episode, unspecified: Secondary | ICD-10-CM | POA: Diagnosis not present

## 2017-04-25 DIAGNOSIS — F419 Anxiety disorder, unspecified: Secondary | ICD-10-CM | POA: Diagnosis not present

## 2017-04-25 DIAGNOSIS — I10 Essential (primary) hypertension: Secondary | ICD-10-CM | POA: Diagnosis not present

## 2017-04-25 DIAGNOSIS — G4762 Sleep related leg cramps: Secondary | ICD-10-CM | POA: Diagnosis not present

## 2017-04-25 DIAGNOSIS — K219 Gastro-esophageal reflux disease without esophagitis: Secondary | ICD-10-CM | POA: Diagnosis not present

## 2017-04-25 DIAGNOSIS — G5620 Lesion of ulnar nerve, unspecified upper limb: Secondary | ICD-10-CM | POA: Diagnosis not present

## 2017-04-25 DIAGNOSIS — R569 Unspecified convulsions: Secondary | ICD-10-CM | POA: Diagnosis not present

## 2017-04-25 DIAGNOSIS — L309 Dermatitis, unspecified: Secondary | ICD-10-CM | POA: Diagnosis not present

## 2017-04-25 DIAGNOSIS — R768 Other specified abnormal immunological findings in serum: Secondary | ICD-10-CM | POA: Diagnosis not present

## 2017-04-25 DIAGNOSIS — Z1389 Encounter for screening for other disorder: Secondary | ICD-10-CM | POA: Diagnosis not present

## 2017-04-25 DIAGNOSIS — M797 Fibromyalgia: Secondary | ICD-10-CM | POA: Diagnosis not present

## 2017-04-30 DIAGNOSIS — L501 Idiopathic urticaria: Secondary | ICD-10-CM | POA: Diagnosis not present

## 2017-05-01 DIAGNOSIS — F329 Major depressive disorder, single episode, unspecified: Secondary | ICD-10-CM | POA: Diagnosis not present

## 2017-05-01 DIAGNOSIS — R768 Other specified abnormal immunological findings in serum: Secondary | ICD-10-CM | POA: Diagnosis not present

## 2017-05-01 DIAGNOSIS — F419 Anxiety disorder, unspecified: Secondary | ICD-10-CM | POA: Diagnosis not present

## 2017-05-01 DIAGNOSIS — G5620 Lesion of ulnar nerve, unspecified upper limb: Secondary | ICD-10-CM | POA: Diagnosis not present

## 2017-05-01 DIAGNOSIS — G4700 Insomnia, unspecified: Secondary | ICD-10-CM | POA: Diagnosis not present

## 2017-05-01 DIAGNOSIS — R569 Unspecified convulsions: Secondary | ICD-10-CM | POA: Diagnosis not present

## 2017-05-01 DIAGNOSIS — E039 Hypothyroidism, unspecified: Secondary | ICD-10-CM | POA: Diagnosis not present

## 2017-05-01 DIAGNOSIS — K219 Gastro-esophageal reflux disease without esophagitis: Secondary | ICD-10-CM | POA: Diagnosis not present

## 2017-05-01 DIAGNOSIS — M797 Fibromyalgia: Secondary | ICD-10-CM | POA: Diagnosis not present

## 2017-05-01 DIAGNOSIS — R55 Syncope and collapse: Secondary | ICD-10-CM | POA: Diagnosis not present

## 2017-05-01 DIAGNOSIS — G4762 Sleep related leg cramps: Secondary | ICD-10-CM | POA: Diagnosis not present

## 2017-05-01 DIAGNOSIS — I10 Essential (primary) hypertension: Secondary | ICD-10-CM | POA: Diagnosis not present

## 2017-05-06 DIAGNOSIS — H5211 Myopia, right eye: Secondary | ICD-10-CM | POA: Diagnosis not present

## 2017-05-06 DIAGNOSIS — H52223 Regular astigmatism, bilateral: Secondary | ICD-10-CM | POA: Diagnosis not present

## 2017-05-06 DIAGNOSIS — H43813 Vitreous degeneration, bilateral: Secondary | ICD-10-CM | POA: Diagnosis not present

## 2017-05-06 DIAGNOSIS — H524 Presbyopia: Secondary | ICD-10-CM | POA: Diagnosis not present

## 2017-06-10 DIAGNOSIS — K625 Hemorrhage of anus and rectum: Secondary | ICD-10-CM | POA: Diagnosis not present

## 2017-06-10 DIAGNOSIS — K580 Irritable bowel syndrome with diarrhea: Secondary | ICD-10-CM | POA: Diagnosis not present

## 2017-06-26 DIAGNOSIS — D550 Anemia due to glucose-6-phosphate dehydrogenase [G6PD] deficiency: Secondary | ICD-10-CM | POA: Diagnosis not present

## 2017-06-26 DIAGNOSIS — M2550 Pain in unspecified joint: Secondary | ICD-10-CM | POA: Diagnosis not present

## 2017-06-26 DIAGNOSIS — R768 Other specified abnormal immunological findings in serum: Secondary | ICD-10-CM | POA: Diagnosis not present

## 2017-07-04 DIAGNOSIS — K580 Irritable bowel syndrome with diarrhea: Secondary | ICD-10-CM | POA: Diagnosis not present

## 2017-07-04 DIAGNOSIS — K625 Hemorrhage of anus and rectum: Secondary | ICD-10-CM | POA: Diagnosis not present

## 2017-07-04 DIAGNOSIS — K641 Second degree hemorrhoids: Secondary | ICD-10-CM | POA: Diagnosis not present

## 2017-08-06 DIAGNOSIS — L501 Idiopathic urticaria: Secondary | ICD-10-CM | POA: Diagnosis not present

## 2017-08-15 DIAGNOSIS — R55 Syncope and collapse: Secondary | ICD-10-CM | POA: Diagnosis not present

## 2017-08-15 DIAGNOSIS — E039 Hypothyroidism, unspecified: Secondary | ICD-10-CM | POA: Diagnosis not present

## 2017-08-15 DIAGNOSIS — G4700 Insomnia, unspecified: Secondary | ICD-10-CM | POA: Diagnosis not present

## 2017-08-15 DIAGNOSIS — F419 Anxiety disorder, unspecified: Secondary | ICD-10-CM | POA: Diagnosis not present

## 2017-08-15 DIAGNOSIS — M797 Fibromyalgia: Secondary | ICD-10-CM | POA: Diagnosis not present

## 2017-08-15 DIAGNOSIS — R768 Other specified abnormal immunological findings in serum: Secondary | ICD-10-CM | POA: Diagnosis not present

## 2017-08-15 DIAGNOSIS — G4762 Sleep related leg cramps: Secondary | ICD-10-CM | POA: Diagnosis not present

## 2017-08-15 DIAGNOSIS — G5620 Lesion of ulnar nerve, unspecified upper limb: Secondary | ICD-10-CM | POA: Diagnosis not present

## 2017-08-15 DIAGNOSIS — F329 Major depressive disorder, single episode, unspecified: Secondary | ICD-10-CM | POA: Diagnosis not present

## 2017-08-15 DIAGNOSIS — I10 Essential (primary) hypertension: Secondary | ICD-10-CM | POA: Diagnosis not present

## 2017-08-15 DIAGNOSIS — K219 Gastro-esophageal reflux disease without esophagitis: Secondary | ICD-10-CM | POA: Diagnosis not present

## 2017-08-15 DIAGNOSIS — R569 Unspecified convulsions: Secondary | ICD-10-CM | POA: Diagnosis not present

## 2017-08-21 DIAGNOSIS — M797 Fibromyalgia: Secondary | ICD-10-CM | POA: Diagnosis not present

## 2017-08-21 DIAGNOSIS — F329 Major depressive disorder, single episode, unspecified: Secondary | ICD-10-CM | POA: Diagnosis not present

## 2017-08-21 DIAGNOSIS — I10 Essential (primary) hypertension: Secondary | ICD-10-CM | POA: Diagnosis not present

## 2017-08-21 DIAGNOSIS — R079 Chest pain, unspecified: Secondary | ICD-10-CM | POA: Diagnosis not present

## 2017-08-21 DIAGNOSIS — F419 Anxiety disorder, unspecified: Secondary | ICD-10-CM | POA: Diagnosis not present

## 2017-08-21 DIAGNOSIS — G5620 Lesion of ulnar nerve, unspecified upper limb: Secondary | ICD-10-CM | POA: Diagnosis not present

## 2017-08-21 DIAGNOSIS — R51 Headache: Secondary | ICD-10-CM | POA: Diagnosis not present

## 2017-08-21 DIAGNOSIS — G4700 Insomnia, unspecified: Secondary | ICD-10-CM | POA: Diagnosis not present

## 2017-08-21 DIAGNOSIS — K219 Gastro-esophageal reflux disease without esophagitis: Secondary | ICD-10-CM | POA: Diagnosis not present

## 2017-08-21 DIAGNOSIS — S0990XA Unspecified injury of head, initial encounter: Secondary | ICD-10-CM | POA: Diagnosis not present

## 2017-08-21 DIAGNOSIS — R768 Other specified abnormal immunological findings in serum: Secondary | ICD-10-CM | POA: Diagnosis not present

## 2017-08-21 DIAGNOSIS — G4762 Sleep related leg cramps: Secondary | ICD-10-CM | POA: Diagnosis not present

## 2017-08-21 DIAGNOSIS — E039 Hypothyroidism, unspecified: Secondary | ICD-10-CM | POA: Diagnosis not present

## 2017-08-21 DIAGNOSIS — R1013 Epigastric pain: Secondary | ICD-10-CM | POA: Diagnosis not present

## 2017-08-21 DIAGNOSIS — R569 Unspecified convulsions: Secondary | ICD-10-CM | POA: Diagnosis not present

## 2017-08-21 DIAGNOSIS — R55 Syncope and collapse: Secondary | ICD-10-CM | POA: Diagnosis not present

## 2017-08-26 DIAGNOSIS — K210 Gastro-esophageal reflux disease with esophagitis: Secondary | ICD-10-CM | POA: Diagnosis not present

## 2017-08-26 DIAGNOSIS — R1013 Epigastric pain: Secondary | ICD-10-CM | POA: Diagnosis not present

## 2017-08-28 DIAGNOSIS — M797 Fibromyalgia: Secondary | ICD-10-CM | POA: Diagnosis not present

## 2017-08-28 DIAGNOSIS — F419 Anxiety disorder, unspecified: Secondary | ICD-10-CM | POA: Diagnosis not present

## 2017-08-28 DIAGNOSIS — G4762 Sleep related leg cramps: Secondary | ICD-10-CM | POA: Diagnosis not present

## 2017-08-28 DIAGNOSIS — M159 Polyosteoarthritis, unspecified: Secondary | ICD-10-CM | POA: Diagnosis not present

## 2017-08-28 DIAGNOSIS — G4700 Insomnia, unspecified: Secondary | ICD-10-CM | POA: Diagnosis not present

## 2017-08-28 DIAGNOSIS — I10 Essential (primary) hypertension: Secondary | ICD-10-CM | POA: Diagnosis not present

## 2017-08-28 DIAGNOSIS — G5620 Lesion of ulnar nerve, unspecified upper limb: Secondary | ICD-10-CM | POA: Diagnosis not present

## 2017-08-28 DIAGNOSIS — R55 Syncope and collapse: Secondary | ICD-10-CM | POA: Diagnosis not present

## 2017-08-28 DIAGNOSIS — R768 Other specified abnormal immunological findings in serum: Secondary | ICD-10-CM | POA: Diagnosis not present

## 2017-08-28 DIAGNOSIS — E039 Hypothyroidism, unspecified: Secondary | ICD-10-CM | POA: Diagnosis not present

## 2017-08-28 DIAGNOSIS — R569 Unspecified convulsions: Secondary | ICD-10-CM | POA: Diagnosis not present

## 2017-08-29 DIAGNOSIS — D550 Anemia due to glucose-6-phosphate dehydrogenase [G6PD] deficiency: Secondary | ICD-10-CM | POA: Diagnosis not present

## 2017-08-29 DIAGNOSIS — K209 Esophagitis, unspecified: Secondary | ICD-10-CM | POA: Diagnosis not present

## 2017-08-29 DIAGNOSIS — R1013 Epigastric pain: Secondary | ICD-10-CM | POA: Diagnosis not present

## 2017-08-29 DIAGNOSIS — E039 Hypothyroidism, unspecified: Secondary | ICD-10-CM | POA: Diagnosis not present

## 2017-08-29 DIAGNOSIS — R635 Abnormal weight gain: Secondary | ICD-10-CM | POA: Diagnosis not present

## 2017-08-29 DIAGNOSIS — K2970 Gastritis, unspecified, without bleeding: Secondary | ICD-10-CM | POA: Diagnosis not present

## 2017-08-29 DIAGNOSIS — R1310 Dysphagia, unspecified: Secondary | ICD-10-CM | POA: Diagnosis not present

## 2017-08-29 DIAGNOSIS — K219 Gastro-esophageal reflux disease without esophagitis: Secondary | ICD-10-CM | POA: Diagnosis not present

## 2017-08-29 DIAGNOSIS — F419 Anxiety disorder, unspecified: Secondary | ICD-10-CM | POA: Diagnosis not present

## 2017-08-29 DIAGNOSIS — K2900 Acute gastritis without bleeding: Secondary | ICD-10-CM | POA: Diagnosis not present

## 2017-08-29 DIAGNOSIS — R768 Other specified abnormal immunological findings in serum: Secondary | ICD-10-CM | POA: Diagnosis not present

## 2017-08-29 DIAGNOSIS — Z79899 Other long term (current) drug therapy: Secondary | ICD-10-CM | POA: Diagnosis not present

## 2017-08-29 DIAGNOSIS — G40909 Epilepsy, unspecified, not intractable, without status epilepticus: Secondary | ICD-10-CM | POA: Diagnosis not present

## 2017-08-29 HISTORY — PX: ESOPHAGOGASTRODUODENOSCOPY: SHX1529

## 2017-09-03 DIAGNOSIS — R55 Syncope and collapse: Secondary | ICD-10-CM | POA: Diagnosis not present

## 2017-09-03 DIAGNOSIS — G4700 Insomnia, unspecified: Secondary | ICD-10-CM | POA: Diagnosis not present

## 2017-09-03 DIAGNOSIS — G5620 Lesion of ulnar nerve, unspecified upper limb: Secondary | ICD-10-CM | POA: Diagnosis not present

## 2017-09-03 DIAGNOSIS — M25512 Pain in left shoulder: Secondary | ICD-10-CM | POA: Diagnosis not present

## 2017-09-03 DIAGNOSIS — F329 Major depressive disorder, single episode, unspecified: Secondary | ICD-10-CM | POA: Diagnosis not present

## 2017-09-03 DIAGNOSIS — R768 Other specified abnormal immunological findings in serum: Secondary | ICD-10-CM | POA: Diagnosis not present

## 2017-09-03 DIAGNOSIS — I10 Essential (primary) hypertension: Secondary | ICD-10-CM | POA: Diagnosis not present

## 2017-09-03 DIAGNOSIS — M159 Polyosteoarthritis, unspecified: Secondary | ICD-10-CM | POA: Diagnosis not present

## 2017-09-03 DIAGNOSIS — F419 Anxiety disorder, unspecified: Secondary | ICD-10-CM | POA: Diagnosis not present

## 2017-09-03 DIAGNOSIS — M797 Fibromyalgia: Secondary | ICD-10-CM | POA: Diagnosis not present

## 2017-09-03 DIAGNOSIS — G4762 Sleep related leg cramps: Secondary | ICD-10-CM | POA: Diagnosis not present

## 2017-09-03 DIAGNOSIS — E039 Hypothyroidism, unspecified: Secondary | ICD-10-CM | POA: Diagnosis not present

## 2017-09-09 DIAGNOSIS — K219 Gastro-esophageal reflux disease without esophagitis: Secondary | ICD-10-CM | POA: Diagnosis not present

## 2017-09-09 DIAGNOSIS — R1013 Epigastric pain: Secondary | ICD-10-CM | POA: Diagnosis not present

## 2017-09-11 DIAGNOSIS — G5620 Lesion of ulnar nerve, unspecified upper limb: Secondary | ICD-10-CM | POA: Diagnosis not present

## 2017-09-11 DIAGNOSIS — R5382 Chronic fatigue, unspecified: Secondary | ICD-10-CM | POA: Diagnosis not present

## 2017-09-11 DIAGNOSIS — G4762 Sleep related leg cramps: Secondary | ICD-10-CM | POA: Diagnosis not present

## 2017-09-11 DIAGNOSIS — F329 Major depressive disorder, single episode, unspecified: Secondary | ICD-10-CM | POA: Diagnosis not present

## 2017-09-11 DIAGNOSIS — F419 Anxiety disorder, unspecified: Secondary | ICD-10-CM | POA: Diagnosis not present

## 2017-09-11 DIAGNOSIS — R569 Unspecified convulsions: Secondary | ICD-10-CM | POA: Diagnosis not present

## 2017-09-11 DIAGNOSIS — I10 Essential (primary) hypertension: Secondary | ICD-10-CM | POA: Diagnosis not present

## 2017-09-11 DIAGNOSIS — K219 Gastro-esophageal reflux disease without esophagitis: Secondary | ICD-10-CM | POA: Diagnosis not present

## 2017-09-11 DIAGNOSIS — R55 Syncope and collapse: Secondary | ICD-10-CM | POA: Diagnosis not present

## 2017-09-11 DIAGNOSIS — E039 Hypothyroidism, unspecified: Secondary | ICD-10-CM | POA: Diagnosis not present

## 2017-09-11 DIAGNOSIS — R768 Other specified abnormal immunological findings in serum: Secondary | ICD-10-CM | POA: Diagnosis not present

## 2017-09-11 DIAGNOSIS — M797 Fibromyalgia: Secondary | ICD-10-CM | POA: Diagnosis not present

## 2017-09-17 DIAGNOSIS — I10 Essential (primary) hypertension: Secondary | ICD-10-CM | POA: Diagnosis not present

## 2017-09-17 DIAGNOSIS — M159 Polyosteoarthritis, unspecified: Secondary | ICD-10-CM | POA: Diagnosis not present

## 2017-09-17 DIAGNOSIS — K219 Gastro-esophageal reflux disease without esophagitis: Secondary | ICD-10-CM | POA: Diagnosis not present

## 2017-09-17 DIAGNOSIS — G5620 Lesion of ulnar nerve, unspecified upper limb: Secondary | ICD-10-CM | POA: Diagnosis not present

## 2017-09-17 DIAGNOSIS — F419 Anxiety disorder, unspecified: Secondary | ICD-10-CM | POA: Diagnosis not present

## 2017-09-17 DIAGNOSIS — R55 Syncope and collapse: Secondary | ICD-10-CM | POA: Diagnosis not present

## 2017-09-17 DIAGNOSIS — F329 Major depressive disorder, single episode, unspecified: Secondary | ICD-10-CM | POA: Diagnosis not present

## 2017-09-17 DIAGNOSIS — M797 Fibromyalgia: Secondary | ICD-10-CM | POA: Diagnosis not present

## 2017-09-17 DIAGNOSIS — E039 Hypothyroidism, unspecified: Secondary | ICD-10-CM | POA: Diagnosis not present

## 2017-09-17 DIAGNOSIS — R569 Unspecified convulsions: Secondary | ICD-10-CM | POA: Diagnosis not present

## 2017-09-17 DIAGNOSIS — G4762 Sleep related leg cramps: Secondary | ICD-10-CM | POA: Diagnosis not present

## 2017-09-17 DIAGNOSIS — R768 Other specified abnormal immunological findings in serum: Secondary | ICD-10-CM | POA: Diagnosis not present

## 2017-09-24 DIAGNOSIS — F329 Major depressive disorder, single episode, unspecified: Secondary | ICD-10-CM | POA: Diagnosis not present

## 2017-09-24 DIAGNOSIS — G4762 Sleep related leg cramps: Secondary | ICD-10-CM | POA: Diagnosis not present

## 2017-09-24 DIAGNOSIS — I10 Essential (primary) hypertension: Secondary | ICD-10-CM | POA: Diagnosis not present

## 2017-09-24 DIAGNOSIS — F419 Anxiety disorder, unspecified: Secondary | ICD-10-CM | POA: Diagnosis not present

## 2017-09-24 DIAGNOSIS — R768 Other specified abnormal immunological findings in serum: Secondary | ICD-10-CM | POA: Diagnosis not present

## 2017-09-24 DIAGNOSIS — R55 Syncope and collapse: Secondary | ICD-10-CM | POA: Diagnosis not present

## 2017-09-24 DIAGNOSIS — K219 Gastro-esophageal reflux disease without esophagitis: Secondary | ICD-10-CM | POA: Diagnosis not present

## 2017-09-24 DIAGNOSIS — M159 Polyosteoarthritis, unspecified: Secondary | ICD-10-CM | POA: Diagnosis not present

## 2017-09-24 DIAGNOSIS — M797 Fibromyalgia: Secondary | ICD-10-CM | POA: Diagnosis not present

## 2017-09-24 DIAGNOSIS — E039 Hypothyroidism, unspecified: Secondary | ICD-10-CM | POA: Diagnosis not present

## 2017-09-24 DIAGNOSIS — R569 Unspecified convulsions: Secondary | ICD-10-CM | POA: Diagnosis not present

## 2017-09-24 DIAGNOSIS — G5620 Lesion of ulnar nerve, unspecified upper limb: Secondary | ICD-10-CM | POA: Diagnosis not present

## 2017-10-20 DIAGNOSIS — M2550 Pain in unspecified joint: Secondary | ICD-10-CM | POA: Diagnosis not present

## 2017-10-20 DIAGNOSIS — Z23 Encounter for immunization: Secondary | ICD-10-CM | POA: Diagnosis not present

## 2017-10-20 DIAGNOSIS — D550 Anemia due to glucose-6-phosphate dehydrogenase [G6PD] deficiency: Secondary | ICD-10-CM | POA: Diagnosis not present

## 2017-10-20 DIAGNOSIS — Z79899 Other long term (current) drug therapy: Secondary | ICD-10-CM | POA: Diagnosis not present

## 2017-10-21 DIAGNOSIS — L501 Idiopathic urticaria: Secondary | ICD-10-CM | POA: Diagnosis not present

## 2017-10-21 DIAGNOSIS — Z1231 Encounter for screening mammogram for malignant neoplasm of breast: Secondary | ICD-10-CM | POA: Diagnosis not present

## 2017-10-23 DIAGNOSIS — G4762 Sleep related leg cramps: Secondary | ICD-10-CM | POA: Diagnosis not present

## 2017-10-23 DIAGNOSIS — M159 Polyosteoarthritis, unspecified: Secondary | ICD-10-CM | POA: Diagnosis not present

## 2017-10-23 DIAGNOSIS — G4700 Insomnia, unspecified: Secondary | ICD-10-CM | POA: Diagnosis not present

## 2017-10-23 DIAGNOSIS — R55 Syncope and collapse: Secondary | ICD-10-CM | POA: Diagnosis not present

## 2017-10-23 DIAGNOSIS — F329 Major depressive disorder, single episode, unspecified: Secondary | ICD-10-CM | POA: Diagnosis not present

## 2017-10-23 DIAGNOSIS — I10 Essential (primary) hypertension: Secondary | ICD-10-CM | POA: Diagnosis not present

## 2017-10-23 DIAGNOSIS — G5620 Lesion of ulnar nerve, unspecified upper limb: Secondary | ICD-10-CM | POA: Diagnosis not present

## 2017-10-23 DIAGNOSIS — R768 Other specified abnormal immunological findings in serum: Secondary | ICD-10-CM | POA: Diagnosis not present

## 2017-10-23 DIAGNOSIS — E039 Hypothyroidism, unspecified: Secondary | ICD-10-CM | POA: Diagnosis not present

## 2017-10-23 DIAGNOSIS — M797 Fibromyalgia: Secondary | ICD-10-CM | POA: Diagnosis not present

## 2017-10-23 DIAGNOSIS — F419 Anxiety disorder, unspecified: Secondary | ICD-10-CM | POA: Diagnosis not present

## 2017-10-23 DIAGNOSIS — K219 Gastro-esophageal reflux disease without esophagitis: Secondary | ICD-10-CM | POA: Diagnosis not present

## 2017-11-13 ENCOUNTER — Encounter: Payer: Self-pay | Admitting: Neurology

## 2017-11-13 ENCOUNTER — Ambulatory Visit (INDEPENDENT_AMBULATORY_CARE_PROVIDER_SITE_OTHER): Payer: Medicare Other | Admitting: Neurology

## 2017-11-13 ENCOUNTER — Telehealth: Payer: Self-pay | Admitting: Neurology

## 2017-11-13 VITALS — BP 128/79 | HR 85 | Ht 66.0 in | Wt 213.0 lb

## 2017-11-13 DIAGNOSIS — Z5181 Encounter for therapeutic drug level monitoring: Secondary | ICD-10-CM

## 2017-11-13 DIAGNOSIS — G40909 Epilepsy, unspecified, not intractable, without status epilepticus: Secondary | ICD-10-CM

## 2017-11-13 HISTORY — DX: Epilepsy, unspecified, not intractable, without status epilepticus: G40.909

## 2017-11-13 NOTE — Telephone Encounter (Signed)
Pt. States they are coming back on 11/14/17 to get bloodwork done.

## 2017-11-13 NOTE — Patient Instructions (Signed)
   We will get MRI of the brain and get an EEG study.  Blood work will be done today.

## 2017-11-13 NOTE — Progress Notes (Signed)
Reason for visit: Seizures  Referring physician: Dr. Asher Muir is a 46 y.o. female  History of present illness:  Ms. Culbreath is a 46 year old right-handed black female with a history of seizures that she claims began in her early 34s.  The patient was initially treated by Dr. Chancy Milroy, she was treated with Depakote for quite a number years, but she had done well on the drug and decided to stop the medication 2 years ago.  The patient has been having seizures off and on over the last 2 years, she had a seizure that occurred on 28 August 2017 during her office visit with her primary care physician.  At that point, Depakote was restarted taking 500 mg twice daily.  The patient has had a hysterectomy.  The patient has not had any further seizures since that time.  She continues to operate a motor vehicle.  The patient indicates that sometimes the seizures are preceded by a feeling of malaise, sometimes she may have no warning.  The patient has been told that she has generalized jerking, the episodes may last 2 minutes.  The patient may bite her tongue, occasionally she may have urinary incontinence.  The patient has also been treated for a skin rash, she has been told she has a connective tissue disorder and she is followed through dermatology and rheumatology.  The patient is tolerating the Depakote dose fairly well.  She has noted over the last 2 years some left-sided numbness affecting the arm and leg.  She feels somewhat weak in the right hand at times.  She denies any troubles with balance or any problems with falling.  She claims that her father also had a history of seizures.  She is sent to this office for further evaluation.  Past Medical History:  Diagnosis Date  . Depression with anxiety   . Fibromyalgia   . G6PD deficiency ([place])   . Hypertension   . Migraine   . Seizure ([place])   . Thyroid disease     Past Surgical History:  Procedure Laterality Date  . ABDOMINAL HYSTERECTOMY     . APPENDECTOMY    . CHOLECYSTECTOMY    . TONSILLECTOMY AND ADENOIDECTOMY      Family History  Problem Relation Age of Onset  . Hypertension Mother   . Kidney cancer Mother   . Heart attack Father        40  . Stroke Maternal Grandmother   . Pancreatic cancer Maternal Grandfather   . Prostate cancer Maternal Grandfather   . Lung cancer Maternal Grandfather     Social history:  has no tobacco, alcohol, and drug history on file.  Medications:  Prior to Admission medications   Not on File     Allergies not on file  ROS:  Out of a complete 14 system review of symptoms, the patient complains only of the following symptoms, and all other reviewed systems are negative.  Weight loss, fatigue Ringing in the ears, dizziness, vertigo Blurred vision, loss of vision Constipation Easy bruising Feeling hot, cold, increased thirst, flushing Joint pain, aching muscles Allergies, skin sensitivity Memory loss, confusion, headache, numbness, weakness, dizziness, seizures, passing out Depression, anxiety, none of sleep, decreased energy, change in appetite, racing thoughts Insomnia, snoring, restless legs  Blood pressure 128/79, pulse 85, height 5\' 6"  (1.676 m), weight 213 lb (96.6 kg).  Physical Exam  General: The patient is alert and cooperative at the time of the examination.  The patient  is moderately obese.  Eyes: Pupils are equal, round, and reactive to light. Discs are flat bilaterally.  Neck: The neck is supple, no carotid bruits are noted.  Respiratory: The respiratory examination is clear.  Cardiovascular: The cardiovascular examination reveals a regular rate and rhythm, no obvious murmurs or rubs are noted.  Skin: Extremities are without significant edema.  Neurologic Exam  Mental status: The patient is alert and oriented x 3 at the time of the examination. The patient has apparent normal recent and remote memory, with an apparently normal attention span and  concentration ability.  Cranial nerves: Facial symmetry is present. There is good sensation of the face to pinprick and soft touch bilaterally. The strength of the facial muscles and the muscles to head turning and shoulder shrug are normal bilaterally. Speech is dysphonic, hoarse, not aphasic. Extraocular movements are full. Visual fields are full. The tongue is midline, and the patient has symmetric elevation of the soft palate. No obvious hearing deficits are noted.  Motor: The motor testing reveals 5 over 5 strength of all 4 extremities. Good symmetric motor tone is noted throughout.  Sensory: Sensory testing is intact to pinprick, soft touch, vibration sensation, and position sense on all 4 extremities, with exception of there is some decrease in pinprick sensation on the left arm and left leg. No evidence of extinction is noted.  Coordination: Cerebellar testing reveals good finger-nose-finger and heel-to-shin bilaterally.  Gait and station: Gait is normal. Tandem gait is normal. Romberg is negative. No drift is seen.  Reflexes: Deep tendon reflexes are symmetric and normal bilaterally. Toes are downgoing bilaterally.   CT head 10/21/08:  IMPRESSION:   1.  No acute intracranial findings. 2.  Paranasal sinus disease as described.  * CT scan images were reviewed online. I agree with the written report.     Assessment/Plan:  1.  History of seizures, recent recurrence  The patient has been well controlled on Depakote, her last seizure was in October 2018.  She is back on Depakote taking 500 mg twice daily.  Will check blood work today.  I have asked her not to operate a motor vehicle for 6 months following her last seizure, if she does well she can return back to driving in April 3903.  The patient reports some numbness on the left side of the body, we will check MRI of the brain.  She will have an EEG study done.  Jill Alexanders MD 11/13/2017 11:42 AM  [facility] [address] [place], [place] [address]  Phone [phone] Fax [phone]

## 2017-11-17 DIAGNOSIS — K219 Gastro-esophageal reflux disease without esophagitis: Secondary | ICD-10-CM | POA: Diagnosis not present

## 2017-11-17 DIAGNOSIS — R490 Dysphonia: Secondary | ICD-10-CM | POA: Diagnosis not present

## 2017-11-21 DIAGNOSIS — R490 Dysphonia: Secondary | ICD-10-CM | POA: Diagnosis not present

## 2017-11-21 DIAGNOSIS — J392 Other diseases of pharynx: Secondary | ICD-10-CM | POA: Diagnosis not present

## 2017-11-24 ENCOUNTER — Telehealth: Payer: Self-pay | Admitting: Neurology

## 2017-11-24 ENCOUNTER — Telehealth: Payer: Self-pay

## 2017-11-24 ENCOUNTER — Ambulatory Visit (INDEPENDENT_AMBULATORY_CARE_PROVIDER_SITE_OTHER): Payer: Medicare Other | Admitting: Neurology

## 2017-11-24 ENCOUNTER — Other Ambulatory Visit (INDEPENDENT_AMBULATORY_CARE_PROVIDER_SITE_OTHER): Payer: Self-pay

## 2017-11-24 DIAGNOSIS — Z5181 Encounter for therapeutic drug level monitoring: Secondary | ICD-10-CM

## 2017-11-24 DIAGNOSIS — Z0289 Encounter for other administrative examinations: Secondary | ICD-10-CM

## 2017-11-24 DIAGNOSIS — G40909 Epilepsy, unspecified, not intractable, without status epilepticus: Secondary | ICD-10-CM

## 2017-11-24 NOTE — Telephone Encounter (Signed)
Future orders for blood work have been placed.

## 2017-11-24 NOTE — Telephone Encounter (Signed)
I called the patient.  The EEG study was normal.  The patient did get her blood work today, I will call her with results when they are available to me.

## 2017-11-24 NOTE — Procedures (Signed)
    History:  Kelly Haas is a 47 year old patient with a history of seizures that began in her early 93s.  She has been treated with Depakote for a number of years.  The seizure episodes are associated with generalized jerking, may last up to 2 minutes.  The patient is being evaluated for these episodes.  This is a routine EEG.  No skull defects are noted.  Medications include Depakote, Norvasc, Flexeril, dicyclomine, gabapentin, Synthroid, Prilosec, methotrexate, prednisone, and Ambien.  EEG classification: Normal awake  Description of the recording: The background rhythms of this recording consists of a fairly well modulated medium amplitude alpha rhythm of 9 Hz that is reactive to eye opening and closure. As the record progresses, the patient appears to remain in the waking state throughout the recording. Photic stimulation was performed, resulting in a bilateral and symmetric photic driving response. Hyperventilation was also performed, resulting in a minimal buildup of the background rhythm activities without significant slowing seen. At no time during the recording does there appear to be evidence of spike or spike wave discharges or evidence of focal slowing. EKG monitor shows no evidence of cardiac rhythm abnormalities with a heart rate of 66.  Impression: This is a normal EEG recording in the waking state. No evidence of ictal or interictal discharges are seen.

## 2017-11-24 NOTE — Telephone Encounter (Signed)
Patient came in to have labs drawn today. She was here in 11/13/17, but she left before having labs done. Lab tech needs these orders put in please. Thanks.

## 2017-11-24 NOTE — Telephone Encounter (Signed)
Confirmed with lab tech that she received them.

## 2017-11-24 NOTE — Addendum Note (Signed)
Addended by: Kathrynn Ducking on: 11/24/2017 03:08 PM   Modules accepted: Orders

## 2017-11-25 ENCOUNTER — Telehealth: Payer: Self-pay | Admitting: *Deleted

## 2017-11-25 LAB — CBC WITH DIFFERENTIAL/PLATELET
BASOS ABS: 0 10*3/uL (ref 0.0–0.2)
BASOS: 0 %
EOS (ABSOLUTE): 0.1 10*3/uL (ref 0.0–0.4)
Eos: 1 %
HEMATOCRIT: 40.3 % (ref 34.0–46.6)
HEMOGLOBIN: 13.3 g/dL (ref 11.1–15.9)
IMMATURE GRANS (ABS): 0 10*3/uL (ref 0.0–0.1)
Immature Granulocytes: 1 %
LYMPHS ABS: 2.2 10*3/uL (ref 0.7–3.1)
LYMPHS: 34 %
MCH: 31.1 pg (ref 26.6–33.0)
MCHC: 33 g/dL (ref 31.5–35.7)
MCV: 94 fL (ref 79–97)
MONOCYTES: 5 %
Monocytes Absolute: 0.3 10*3/uL (ref 0.1–0.9)
NEUTROS ABS: 3.9 10*3/uL (ref 1.4–7.0)
Neutrophils: 59 %
Platelets: 354 10*3/uL (ref 150–379)
RBC: 4.28 x10E6/uL (ref 3.77–5.28)
RDW: 13.7 % (ref 12.3–15.4)
WBC: 6.5 10*3/uL (ref 3.4–10.8)

## 2017-11-25 LAB — COMPREHENSIVE METABOLIC PANEL
A/G RATIO: 1.4 (ref 1.2–2.2)
ALBUMIN: 4.1 g/dL (ref 3.5–5.5)
ALT: 14 IU/L (ref 0–32)
AST: 15 IU/L (ref 0–40)
Alkaline Phosphatase: 81 IU/L (ref 39–117)
BILIRUBIN TOTAL: 0.3 mg/dL (ref 0.0–1.2)
BUN / CREAT RATIO: 9 (ref 9–23)
BUN: 7 mg/dL (ref 6–24)
CHLORIDE: 107 mmol/L — AB (ref 96–106)
CO2: 22 mmol/L (ref 20–29)
Calcium: 9.6 mg/dL (ref 8.7–10.2)
Creatinine, Ser: 0.82 mg/dL (ref 0.57–1.00)
GFR calc non Af Amer: 87 mL/min/{1.73_m2} (ref >59)
GFR, EST AFRICAN AMERICAN: 100 mL/min/{1.73_m2} (ref >59)
GLOBULIN, TOTAL: 2.9 g/dL (ref 1.5–4.5)
GLUCOSE: 87 mg/dL (ref 65–99)
Potassium: 4.5 mmol/L (ref 3.5–5.2)
SODIUM: 145 mmol/L — AB (ref 134–144)
TOTAL PROTEIN: 7 g/dL (ref 6.0–8.5)

## 2017-11-25 LAB — VALPROIC ACID LEVEL: VALPROIC ACID LVL: 69 ug/mL (ref 50–100)

## 2017-11-25 NOTE — Telephone Encounter (Signed)
Called and spoke with patient about lab results per CW,MD note. Patient verbalized understanding and appreciation for call.

## 2017-11-25 NOTE — Telephone Encounter (Signed)
-----   Message from Kathrynn Ducking, MD sent at 11/25/2017  7:58 AM EST ----- Blood work is unremarkable with exception of a very minimal elevation in the sodium level, not likely to be clinically significant, will follow.  Valproic acid level is in the therapeutic range.  No change in dosing.  Blood count is unremarkable. Please call the patient. ----- Message ----- From: Lavone Neri Lab Results In Sent: 11/25/2017   7:43 AM To: Kathrynn Ducking, MD

## 2017-11-29 ENCOUNTER — Ambulatory Visit
Admission: RE | Admit: 2017-11-29 | Discharge: 2017-11-29 | Disposition: A | Discharge status: Home / Self Care | Payer: Medicare Other | Source: Ambulatory Visit | Attending: Neurology | Admitting: Neurology

## 2017-11-29 DIAGNOSIS — G40909 Epilepsy, unspecified, not intractable, without status epilepticus: Secondary | ICD-10-CM

## 2017-11-29 MED ORDER — GADOBENATE DIMEGLUMINE 529 MG/ML IV SOLN
20.0000 mL | Freq: Once | INTRAVENOUS | Status: AC | PRN
  Administered 2017-11-29: 20 mL via INTRAVENOUS

## 2017-11-30 ENCOUNTER — Telehealth: Payer: Self-pay | Admitting: Neurology

## 2017-11-30 NOTE — Telephone Encounter (Signed)
  I called the patient. The MRI of the brain is normal. She will continue the VPA and call if she has another seizure.  MRI brain 11/29/17:  IMPRESSION:  This is a normal MRI of the brain with and without contrast.

## 2017-12-02 DIAGNOSIS — I10 Essential (primary) hypertension: Secondary | ICD-10-CM | POA: Diagnosis not present

## 2017-12-02 DIAGNOSIS — F329 Major depressive disorder, single episode, unspecified: Secondary | ICD-10-CM | POA: Diagnosis not present

## 2017-12-02 DIAGNOSIS — E039 Hypothyroidism, unspecified: Secondary | ICD-10-CM | POA: Diagnosis not present

## 2017-12-02 DIAGNOSIS — G4762 Sleep related leg cramps: Secondary | ICD-10-CM | POA: Diagnosis not present

## 2017-12-02 DIAGNOSIS — M797 Fibromyalgia: Secondary | ICD-10-CM | POA: Diagnosis not present

## 2017-12-02 DIAGNOSIS — K219 Gastro-esophageal reflux disease without esophagitis: Secondary | ICD-10-CM | POA: Diagnosis not present

## 2017-12-02 DIAGNOSIS — M159 Polyosteoarthritis, unspecified: Secondary | ICD-10-CM | POA: Diagnosis not present

## 2017-12-02 DIAGNOSIS — G5620 Lesion of ulnar nerve, unspecified upper limb: Secondary | ICD-10-CM | POA: Diagnosis not present

## 2017-12-02 DIAGNOSIS — R55 Syncope and collapse: Secondary | ICD-10-CM | POA: Diagnosis not present

## 2017-12-02 DIAGNOSIS — R768 Other specified abnormal immunological findings in serum: Secondary | ICD-10-CM | POA: Diagnosis not present

## 2017-12-02 DIAGNOSIS — M25552 Pain in left hip: Secondary | ICD-10-CM | POA: Diagnosis not present

## 2017-12-02 DIAGNOSIS — F419 Anxiety disorder, unspecified: Secondary | ICD-10-CM | POA: Diagnosis not present

## 2017-12-04 DIAGNOSIS — N760 Acute vaginitis: Secondary | ICD-10-CM | POA: Diagnosis not present

## 2017-12-04 DIAGNOSIS — Z124 Encounter for screening for malignant neoplasm of cervix: Secondary | ICD-10-CM | POA: Diagnosis not present

## 2017-12-04 DIAGNOSIS — Z01419 Encounter for gynecological examination (general) (routine) without abnormal findings: Secondary | ICD-10-CM | POA: Diagnosis not present

## 2017-12-09 DIAGNOSIS — M159 Polyosteoarthritis, unspecified: Secondary | ICD-10-CM | POA: Diagnosis not present

## 2017-12-09 DIAGNOSIS — G5620 Lesion of ulnar nerve, unspecified upper limb: Secondary | ICD-10-CM | POA: Diagnosis not present

## 2017-12-09 DIAGNOSIS — G4762 Sleep related leg cramps: Secondary | ICD-10-CM | POA: Diagnosis not present

## 2017-12-09 DIAGNOSIS — E039 Hypothyroidism, unspecified: Secondary | ICD-10-CM | POA: Diagnosis not present

## 2017-12-09 DIAGNOSIS — I10 Essential (primary) hypertension: Secondary | ICD-10-CM | POA: Diagnosis not present

## 2017-12-09 DIAGNOSIS — G4700 Insomnia, unspecified: Secondary | ICD-10-CM | POA: Diagnosis not present

## 2017-12-09 DIAGNOSIS — M25552 Pain in left hip: Secondary | ICD-10-CM | POA: Diagnosis not present

## 2017-12-09 DIAGNOSIS — F329 Major depressive disorder, single episode, unspecified: Secondary | ICD-10-CM | POA: Diagnosis not present

## 2017-12-09 DIAGNOSIS — F419 Anxiety disorder, unspecified: Secondary | ICD-10-CM | POA: Diagnosis not present

## 2017-12-09 DIAGNOSIS — R55 Syncope and collapse: Secondary | ICD-10-CM | POA: Diagnosis not present

## 2017-12-09 DIAGNOSIS — K219 Gastro-esophageal reflux disease without esophagitis: Secondary | ICD-10-CM | POA: Diagnosis not present

## 2017-12-09 DIAGNOSIS — R768 Other specified abnormal immunological findings in serum: Secondary | ICD-10-CM | POA: Diagnosis not present

## 2017-12-15 DIAGNOSIS — J329 Chronic sinusitis, unspecified: Secondary | ICD-10-CM | POA: Diagnosis not present

## 2017-12-15 DIAGNOSIS — R490 Dysphonia: Secondary | ICD-10-CM | POA: Diagnosis not present

## 2017-12-15 DIAGNOSIS — R0981 Nasal congestion: Secondary | ICD-10-CM | POA: Diagnosis not present

## 2017-12-16 DIAGNOSIS — G5620 Lesion of ulnar nerve, unspecified upper limb: Secondary | ICD-10-CM | POA: Diagnosis not present

## 2017-12-16 DIAGNOSIS — R768 Other specified abnormal immunological findings in serum: Secondary | ICD-10-CM | POA: Diagnosis not present

## 2017-12-16 DIAGNOSIS — I10 Essential (primary) hypertension: Secondary | ICD-10-CM | POA: Diagnosis not present

## 2017-12-16 DIAGNOSIS — K219 Gastro-esophageal reflux disease without esophagitis: Secondary | ICD-10-CM | POA: Diagnosis not present

## 2017-12-16 DIAGNOSIS — G4762 Sleep related leg cramps: Secondary | ICD-10-CM | POA: Diagnosis not present

## 2017-12-16 DIAGNOSIS — G4700 Insomnia, unspecified: Secondary | ICD-10-CM | POA: Diagnosis not present

## 2017-12-16 DIAGNOSIS — R490 Dysphonia: Secondary | ICD-10-CM | POA: Diagnosis not present

## 2017-12-16 DIAGNOSIS — F329 Major depressive disorder, single episode, unspecified: Secondary | ICD-10-CM | POA: Diagnosis not present

## 2017-12-16 DIAGNOSIS — F419 Anxiety disorder, unspecified: Secondary | ICD-10-CM | POA: Diagnosis not present

## 2017-12-16 DIAGNOSIS — M159 Polyosteoarthritis, unspecified: Secondary | ICD-10-CM | POA: Diagnosis not present

## 2017-12-16 DIAGNOSIS — E039 Hypothyroidism, unspecified: Secondary | ICD-10-CM | POA: Diagnosis not present

## 2017-12-16 DIAGNOSIS — R55 Syncope and collapse: Secondary | ICD-10-CM | POA: Diagnosis not present

## 2017-12-23 DIAGNOSIS — R490 Dysphonia: Secondary | ICD-10-CM | POA: Diagnosis not present

## 2017-12-30 DIAGNOSIS — M159 Polyosteoarthritis, unspecified: Secondary | ICD-10-CM | POA: Diagnosis not present

## 2017-12-30 DIAGNOSIS — F329 Major depressive disorder, single episode, unspecified: Secondary | ICD-10-CM | POA: Diagnosis not present

## 2017-12-30 DIAGNOSIS — R569 Unspecified convulsions: Secondary | ICD-10-CM | POA: Diagnosis not present

## 2017-12-30 DIAGNOSIS — G4762 Sleep related leg cramps: Secondary | ICD-10-CM | POA: Diagnosis not present

## 2017-12-30 DIAGNOSIS — G4700 Insomnia, unspecified: Secondary | ICD-10-CM | POA: Diagnosis not present

## 2017-12-30 DIAGNOSIS — R55 Syncope and collapse: Secondary | ICD-10-CM | POA: Diagnosis not present

## 2017-12-30 DIAGNOSIS — K219 Gastro-esophageal reflux disease without esophagitis: Secondary | ICD-10-CM | POA: Diagnosis not present

## 2017-12-30 DIAGNOSIS — E039 Hypothyroidism, unspecified: Secondary | ICD-10-CM | POA: Diagnosis not present

## 2017-12-30 DIAGNOSIS — G5620 Lesion of ulnar nerve, unspecified upper limb: Secondary | ICD-10-CM | POA: Diagnosis not present

## 2017-12-30 DIAGNOSIS — F419 Anxiety disorder, unspecified: Secondary | ICD-10-CM | POA: Diagnosis not present

## 2017-12-30 DIAGNOSIS — I10 Essential (primary) hypertension: Secondary | ICD-10-CM | POA: Diagnosis not present

## 2017-12-30 DIAGNOSIS — R768 Other specified abnormal immunological findings in serum: Secondary | ICD-10-CM | POA: Diagnosis not present

## 2018-01-05 DIAGNOSIS — J383 Other diseases of vocal cords: Secondary | ICD-10-CM | POA: Diagnosis not present

## 2018-01-05 DIAGNOSIS — F458 Other somatoform disorders: Secondary | ICD-10-CM | POA: Diagnosis not present

## 2018-01-05 DIAGNOSIS — J385 Laryngeal spasm: Secondary | ICD-10-CM | POA: Diagnosis not present

## 2018-01-05 DIAGNOSIS — K219 Gastro-esophageal reflux disease without esophagitis: Secondary | ICD-10-CM | POA: Diagnosis not present

## 2018-01-05 DIAGNOSIS — R490 Dysphonia: Secondary | ICD-10-CM | POA: Diagnosis not present

## 2018-01-13 DIAGNOSIS — F458 Other somatoform disorders: Secondary | ICD-10-CM | POA: Diagnosis not present

## 2018-01-13 DIAGNOSIS — R490 Dysphonia: Secondary | ICD-10-CM | POA: Diagnosis not present

## 2018-01-13 DIAGNOSIS — J385 Laryngeal spasm: Secondary | ICD-10-CM | POA: Diagnosis not present

## 2018-01-19 DIAGNOSIS — K219 Gastro-esophageal reflux disease without esophagitis: Secondary | ICD-10-CM | POA: Diagnosis not present

## 2018-01-19 DIAGNOSIS — L501 Idiopathic urticaria: Secondary | ICD-10-CM | POA: Diagnosis not present

## 2018-01-19 DIAGNOSIS — E785 Hyperlipidemia, unspecified: Secondary | ICD-10-CM | POA: Diagnosis not present

## 2018-01-19 DIAGNOSIS — G5620 Lesion of ulnar nerve, unspecified upper limb: Secondary | ICD-10-CM | POA: Diagnosis not present

## 2018-01-19 DIAGNOSIS — E039 Hypothyroidism, unspecified: Secondary | ICD-10-CM | POA: Diagnosis not present

## 2018-01-19 DIAGNOSIS — F329 Major depressive disorder, single episode, unspecified: Secondary | ICD-10-CM | POA: Diagnosis not present

## 2018-01-19 DIAGNOSIS — F419 Anxiety disorder, unspecified: Secondary | ICD-10-CM | POA: Diagnosis not present

## 2018-01-19 DIAGNOSIS — K529 Noninfective gastroenteritis and colitis, unspecified: Secondary | ICD-10-CM | POA: Diagnosis not present

## 2018-01-19 DIAGNOSIS — M159 Polyosteoarthritis, unspecified: Secondary | ICD-10-CM | POA: Diagnosis not present

## 2018-01-19 DIAGNOSIS — I10 Essential (primary) hypertension: Secondary | ICD-10-CM | POA: Diagnosis not present

## 2018-01-19 DIAGNOSIS — L299 Pruritus, unspecified: Secondary | ICD-10-CM | POA: Diagnosis not present

## 2018-01-19 DIAGNOSIS — G4700 Insomnia, unspecified: Secondary | ICD-10-CM | POA: Diagnosis not present

## 2018-01-19 DIAGNOSIS — R768 Other specified abnormal immunological findings in serum: Secondary | ICD-10-CM | POA: Diagnosis not present

## 2018-01-19 DIAGNOSIS — R531 Weakness: Secondary | ICD-10-CM | POA: Diagnosis not present

## 2018-01-19 DIAGNOSIS — G4762 Sleep related leg cramps: Secondary | ICD-10-CM | POA: Diagnosis not present

## 2018-01-19 DIAGNOSIS — R55 Syncope and collapse: Secondary | ICD-10-CM | POA: Diagnosis not present

## 2018-01-20 DIAGNOSIS — J385 Laryngeal spasm: Secondary | ICD-10-CM | POA: Diagnosis not present

## 2018-01-20 DIAGNOSIS — F458 Other somatoform disorders: Secondary | ICD-10-CM | POA: Diagnosis not present

## 2018-01-20 DIAGNOSIS — R490 Dysphonia: Secondary | ICD-10-CM | POA: Diagnosis not present

## 2018-02-02 DIAGNOSIS — M1990 Unspecified osteoarthritis, unspecified site: Secondary | ICD-10-CM | POA: Diagnosis not present

## 2018-02-02 DIAGNOSIS — G629 Polyneuropathy, unspecified: Secondary | ICD-10-CM

## 2018-02-02 DIAGNOSIS — Z79899 Other long term (current) drug therapy: Secondary | ICD-10-CM

## 2018-02-02 DIAGNOSIS — R7989 Other specified abnormal findings of blood chemistry: Secondary | ICD-10-CM | POA: Diagnosis not present

## 2018-02-02 DIAGNOSIS — R490 Dysphonia: Secondary | ICD-10-CM

## 2018-02-02 HISTORY — DX: Other long term (current) drug therapy: Z79.899

## 2018-02-02 HISTORY — DX: Dysphonia: R49.0

## 2018-02-02 HISTORY — DX: Polyneuropathy, unspecified: G62.9

## 2018-02-03 DIAGNOSIS — J385 Laryngeal spasm: Secondary | ICD-10-CM | POA: Diagnosis not present

## 2018-02-03 DIAGNOSIS — R490 Dysphonia: Secondary | ICD-10-CM | POA: Diagnosis not present

## 2018-02-03 DIAGNOSIS — F458 Other somatoform disorders: Secondary | ICD-10-CM | POA: Diagnosis not present

## 2018-02-11 DIAGNOSIS — F458 Other somatoform disorders: Secondary | ICD-10-CM | POA: Diagnosis not present

## 2018-02-11 DIAGNOSIS — R490 Dysphonia: Secondary | ICD-10-CM | POA: Diagnosis not present

## 2018-02-11 DIAGNOSIS — J385 Laryngeal spasm: Secondary | ICD-10-CM | POA: Diagnosis not present

## 2018-02-12 DIAGNOSIS — K219 Gastro-esophageal reflux disease without esophagitis: Secondary | ICD-10-CM | POA: Diagnosis not present

## 2018-02-12 DIAGNOSIS — G4762 Sleep related leg cramps: Secondary | ICD-10-CM | POA: Diagnosis not present

## 2018-02-12 DIAGNOSIS — Z9889 Other specified postprocedural states: Secondary | ICD-10-CM | POA: Diagnosis not present

## 2018-02-12 DIAGNOSIS — I10 Essential (primary) hypertension: Secondary | ICD-10-CM | POA: Diagnosis not present

## 2018-02-12 DIAGNOSIS — F419 Anxiety disorder, unspecified: Secondary | ICD-10-CM | POA: Diagnosis not present

## 2018-02-12 DIAGNOSIS — B351 Tinea unguium: Secondary | ICD-10-CM | POA: Diagnosis not present

## 2018-02-12 DIAGNOSIS — G5620 Lesion of ulnar nerve, unspecified upper limb: Secondary | ICD-10-CM | POA: Diagnosis not present

## 2018-02-12 DIAGNOSIS — R490 Dysphonia: Secondary | ICD-10-CM | POA: Diagnosis not present

## 2018-02-12 DIAGNOSIS — J342 Deviated nasal septum: Secondary | ICD-10-CM | POA: Diagnosis not present

## 2018-02-12 DIAGNOSIS — M159 Polyosteoarthritis, unspecified: Secondary | ICD-10-CM | POA: Diagnosis not present

## 2018-02-12 DIAGNOSIS — G4700 Insomnia, unspecified: Secondary | ICD-10-CM | POA: Diagnosis not present

## 2018-02-12 DIAGNOSIS — E039 Hypothyroidism, unspecified: Secondary | ICD-10-CM | POA: Diagnosis not present

## 2018-02-12 DIAGNOSIS — J343 Hypertrophy of nasal turbinates: Secondary | ICD-10-CM | POA: Diagnosis not present

## 2018-02-12 DIAGNOSIS — F329 Major depressive disorder, single episode, unspecified: Secondary | ICD-10-CM | POA: Diagnosis not present

## 2018-02-12 DIAGNOSIS — R768 Other specified abnormal immunological findings in serum: Secondary | ICD-10-CM | POA: Diagnosis not present

## 2018-02-12 DIAGNOSIS — R55 Syncope and collapse: Secondary | ICD-10-CM | POA: Diagnosis not present

## 2018-02-12 DIAGNOSIS — J392 Other diseases of pharynx: Secondary | ICD-10-CM | POA: Diagnosis not present

## 2018-02-13 ENCOUNTER — Encounter (INDEPENDENT_AMBULATORY_CARE_PROVIDER_SITE_OTHER): Payer: Self-pay

## 2018-02-13 ENCOUNTER — Encounter: Payer: Self-pay | Admitting: Neurology

## 2018-02-13 ENCOUNTER — Ambulatory Visit (INDEPENDENT_AMBULATORY_CARE_PROVIDER_SITE_OTHER): Payer: Medicare Other | Admitting: Neurology

## 2018-02-13 VITALS — BP 121/69 | HR 81 | Ht 66.0 in | Wt 212.0 lb

## 2018-02-13 DIAGNOSIS — R202 Paresthesia of skin: Secondary | ICD-10-CM | POA: Diagnosis not present

## 2018-02-13 NOTE — Patient Instructions (Signed)
   We will get EMG and NCV evaluation to look at nerve function of the arms.

## 2018-02-13 NOTE — Progress Notes (Signed)
Reason for visit: Paresthesias, history of seizures  Kelly Haas is an 47 y.o. female  History of present illness:  Kelly Haas is a 47 year old right-handed black female with a history of seizures, the patient currently is taking Depakote for this.  She reports no seizures since last seen.  She comes back today for a new problem, referred by her rheumatologist.  The patient has noted over the last 2 months that she has had cold sensations in the fourth and fifth fingers of the hands bilaterally.  The patient recently underwent MRI of the brain because of a left hemisensory deficit, the MRI was normal.  The patient also reports some occasional tingling in the feet that may come and go.  The patient reports no weakness, she has not had any falls, she has not had any change in control of the bowels or the bladder.  She reports no significant neck pain or low back pain.  Given the new sensory findings, she returns for further evaluation.  Past Medical History:  Diagnosis Date  . Depression with anxiety   . Fibromyalgia   . G6PD deficiency ([place])   . Hypertension   . Kidney stone   . Migraine   . Seizure ([place])   . Seizure disorder ([place]) 11/13/2017  . Thyroid disease     Past Surgical History:  Procedure Laterality Date  . ABDOMINAL HYSTERECTOMY    . APPENDECTOMY    . CHOLECYSTECTOMY    . TONSILLECTOMY AND ADENOIDECTOMY      Family History  Problem Relation Age of Onset  . Hypertension Mother   . Kidney cancer Mother   . Heart attack Father        15  . Seizures Father   . Stroke Maternal Grandmother   . Pancreatic cancer Maternal Grandfather   . Prostate cancer Maternal Grandfather   . Lung cancer Maternal Grandfather     Social history:  reports that she has never smoked. She has never used smokeless tobacco. She reports that she does not drink alcohol or use drugs.    Allergies  Allergen Reactions  . Bee Venom Anaphylaxis  . Ibuprofen Hives, Nausea And Vomiting  and Rash    Other reaction(s): Vomiting (intolerance)  . Acetaminophen   . Aminolevulinate Derivatives   . Aspirin-Acetaminophen-Caffeine   . Dapsone     Other reaction(s): Other (See Comments)  . Dimercaprol     Other reaction(s): Other (See Comments)  . Methylcellulose   . Nitrofuran Derivatives   . Phenazopyridine   . Primaquine   . Toluidine Blue   . Latex Rash  . Oxycodone Rash  . Phenergan [Promethazine Hcl] Rash    Medications:  Prior to Admission medications   Medication Sig Start Date End Date Taking? Authorizing Provider  amLODipine (NORVASC) 5 MG tablet Take by mouth.   Yes [provider]  cyclobenzaprine (FLEXERIL) 10 MG tablet Take 1 tablet (10 mg total) by mouth 3 (three) times daily as needed for Muscle spasms. 10/20/17  Yes [provider]  dicyclomine (BENTYL) 10 MG capsule Take by mouth.   Yes [provider]  divalproex (DEPAKOTE) 500 MG DR tablet Take 500 mg by mouth 2 (two) times daily.   Yes [provider]  EPINEPHrine 0.3 mg/0.3 mL IJ SOAJ injection Inject into the muscle.   Yes [provider]  escitalopram (LEXAPRO) 20 MG tablet Take by mouth.   Yes [provider]  folic acid (FOLVITE) 1 MG tablet Take  1 mg by mouth daily. 10/21/17  Yes [provider]  gabapentin (NEURONTIN) 600 MG tablet Take 600 mg by mouth 3 (three) times daily. 09/02/17  Yes [provider]  levothyroxine (SYNTHROID, LEVOTHROID) 50 MCG tablet Take by mouth every other day.   Yes [provider]  levothyroxine (SYNTHROID, LEVOTHROID) 75 MCG tablet Take by mouth every other day.   Yes [provider]  LYRICA 50 MG capsule Take 1 capsule by mouth 2 (two) times daily.  01/21/18  Yes [provider]  methotrexate (RHEUMATREX) 2.5 MG tablet Take 8 tabs once weekly 10/20/17  Yes [provider]  omeprazole (PRILOSEC) 40 MG capsule Take 40 mg by mouth daily. 08/15/17  Yes [provider]  predniSONE (DELTASONE) 5 MG tablet Take 5 mg by mouth as needed.   Yes [provider]  ranitidine (ZANTAC) 150 MG tablet Take 300 mg by mouth at bedtime. 08/06/17  Yes [provider]  zolpidem (AMBIEN) 10 MG tablet Take 10 mg by mouth at bedtime as needed. for sleep 10/20/17  Yes [provider]    ROS:  Out of a complete 14 system review of symptoms, the patient complains only of the following symptoms, and all other reviewed systems are negative.  Weight gain, will fatigue Blurred vision Easy bruising Memory loss, headache, numbness, weakness, seizures Skin sensitivity Insomnia  Blood pressure 121/69, pulse 81, height 5\' 6"  (1.676 m), weight 212 lb (96.2 kg).  Physical Exam  General: The patient is alert and cooperative at the time of the examination.  The patient is mildly obese.  Skin: No significant peripheral edema is noted.   Neurologic Exam  Mental status: The patient is alert and oriented x 3 at the time of the examination. The patient has apparent normal recent and remote memory, with an apparently normal attention span and concentration ability.   Cranial nerves: Facial symmetry is present. Speech is normal, no aphasia or dysarthria is noted. Extraocular movements are full. Visual fields are full.  Pupils are equal, round, and reactive to light.  Discs are flat bilaterally.  Motor: The patient has good strength in all 4 extremities.  Sensory examination: Soft touch sensation is symmetrical in the face, decreased on the left arm and left leg.  Pinprick sensation is decreased on the left face, arm, and leg.  Vibration sensation is decreased on the left arm and leg.  The patient has good position sense in all 4 extremities..  Coordination: The patient has good finger-nose-finger and heel-to-shin bilaterally.  Gait and station: The patient has a normal gait. Tandem gait is normal. Romberg is negative. No drift is  seen.  Reflexes: Deep tendon reflexes are symmetric, but are decreased throughout.   Assessment/Plan:  1.  History of seizures  2.  Left hemisensory deficit, normal MRI brain  3.  Reports of paresthesias, all 4 extremities  The patient had new sensory complaints of the arms and legs.  The patient will be sent for nerve conduction studies of both arms and one leg.  The patient will have EMG of the left upper extremity.  If a neuropathy is noted, further blood work will be done.  The patient otherwise will follow-up in 6 months.  Jill Alexanders MD 02/13/2018 11:30 AM  [facility] [address] [place], [place] [address]  Phone [phone] Fax [phone]

## 2018-02-16 DIAGNOSIS — R490 Dysphonia: Secondary | ICD-10-CM | POA: Diagnosis not present

## 2018-02-16 DIAGNOSIS — J385 Laryngeal spasm: Secondary | ICD-10-CM | POA: Diagnosis not present

## 2018-02-16 DIAGNOSIS — F458 Other somatoform disorders: Secondary | ICD-10-CM | POA: Diagnosis not present

## 2018-02-19 DIAGNOSIS — M79675 Pain in left toe(s): Secondary | ICD-10-CM | POA: Diagnosis not present

## 2018-02-19 DIAGNOSIS — B351 Tinea unguium: Secondary | ICD-10-CM

## 2018-02-19 HISTORY — DX: Tinea unguium: B35.1

## 2018-02-23 ENCOUNTER — Telehealth: Payer: Self-pay | Admitting: Gastroenterology

## 2018-02-23 DIAGNOSIS — R490 Dysphonia: Secondary | ICD-10-CM | POA: Diagnosis not present

## 2018-02-23 DIAGNOSIS — B351 Tinea unguium: Secondary | ICD-10-CM | POA: Diagnosis not present

## 2018-02-23 DIAGNOSIS — J385 Laryngeal spasm: Secondary | ICD-10-CM | POA: Diagnosis not present

## 2018-02-23 DIAGNOSIS — F458 Other somatoform disorders: Secondary | ICD-10-CM | POA: Diagnosis not present

## 2018-02-23 DIAGNOSIS — M79675 Pain in left toe(s): Secondary | ICD-10-CM | POA: Diagnosis not present

## 2018-02-23 NOTE — Telephone Encounter (Signed)
Pt is a previous [name] pt.  She is having problems with hemorrhoids and wants to know what to do

## 2018-02-23 NOTE — Telephone Encounter (Signed)
She was told to try any of the OTC hemorrhoid medications.  Also, she could consult with the pharmacist.

## 2018-02-23 NOTE — Telephone Encounter (Signed)
Patient wanting to know if there is something she can take over the counter for her hemorrhoids.

## 2018-03-05 DIAGNOSIS — B351 Tinea unguium: Secondary | ICD-10-CM | POA: Diagnosis not present

## 2018-03-05 DIAGNOSIS — M79675 Pain in left toe(s): Secondary | ICD-10-CM | POA: Diagnosis not present

## 2018-03-11 ENCOUNTER — Ambulatory Visit (INDEPENDENT_AMBULATORY_CARE_PROVIDER_SITE_OTHER): Payer: Medicare Other | Admitting: Neurology

## 2018-03-11 ENCOUNTER — Encounter: Payer: Self-pay | Admitting: Neurology

## 2018-03-11 DIAGNOSIS — R202 Paresthesia of skin: Secondary | ICD-10-CM | POA: Diagnosis not present

## 2018-03-11 NOTE — Procedures (Signed)
     HISTORY:  Kelly Haas is a 47 year old patient with a history of paresthesias in the fourth and fifth fingers on both hands and some paresthesias in the feet as well.  The patient is being evaluated for a possible peripheral neuropathy or an ulnar neuropathy.  NERVE CONDUCTION STUDIES:  Nerve conduction studies were performed on both upper extremities.  The distal motor latencies and motor amplitudes for the median and ulnar nerves were within normal limits bilaterally.  The nerve conduction velocities for these nerves were within normal limits bilaterally with exception that there was slight slowing above the elbow for the left ulnar nerve.  The sensory latencies for the median and ulnar nerves are within normal limits bilaterally but there was a slightly low amplitude for the left ulnar sensory latency.  The F-wave latencies for the ulnar nerves were normal bilaterally.  Nerve conduction studies were performed on the right lower extremity.  The distal motor latencies and motor amplitudes for the right peroneal and posterior tibial nerves were normal with normal nerve conduction velocities seen for these nerves.  The right sural and peroneal sensory latencies were within normal limits.  The F-wave latency for the right posterior tibial nerve was normal.  EMG STUDIES:  EMG study was performed on the left upper extremity:  The first dorsal interosseous muscle reveals 2 to 4 K units with full recruitment. No fibrillations or positive waves were noted. The abductor pollicis brevis muscle reveals 2 to 4 K units with full recruitment. No fibrillations or positive waves were noted. The extensor indicis proprius muscle reveals 1 to 3 K units with full recruitment. No fibrillations or positive waves were noted. The pronator teres muscle reveals 2 to 3 K units with full recruitment. No fibrillations or positive waves were noted. The flexor digitorum profundus muscle was tested (III-IV) and revealed  2 to 3 K units with full recruitment.  No fibrillations or positive waves were noted. The biceps muscle reveals 1 to 2 K units with full recruitment. No fibrillations or positive waves were noted. The triceps muscle reveals 2 to 4 K units with full recruitment. No fibrillations or positive waves were noted. The anterior deltoid muscle reveals 2 to 3 K units with full recruitment. No fibrillations or positive waves were noted. The cervical paraspinal muscles were tested at 2 levels. No abnormalities of insertional activity were seen at either level tested. There was good relaxation.   IMPRESSION:  Nerve conduction studies done on both upper extremities and on the right lower extremity do not show evidence of a peripheral neuropathy.  The nerve conduction studies suggest a very mild left ulnar neuropathy at the elbow.  EMG evaluation of the left upper extremity was completely normal without evidence of an overlying cervical radiculopathy.  Jill Alexanders MD 03/11/2018 2:56 PM  [facility] [address] [place], [place] [address]  Phone [phone] Fax [phone]

## 2018-03-11 NOTE — Progress Notes (Addendum)
The patient comes in for EMG and nerve conduction study evaluation.  Nerve conductions do not show evidence of a peripheral neuropathy.  Patient may have a very mild left ulnar neuropathy at the elbow.  She is to use an athletic elbow pad, inverting the pad at night, and protecting the elbow during the day.     [place]    Nerve / Sites Muscle Latency Ref. Amplitude Ref. Rel Amp Segments Distance Velocity Ref. Area    ms ms mV mV %  cm m/s m/s mVms  R Median - APB     Wrist APB 3.2 ?4.4 6.6 ?4.0 100 Wrist - APB 7   24.2     Upper arm APB 7.3  6.3  94.7 Upper arm - Wrist 23 55 ?49 22.4  L Median - APB     Wrist APB 3.3 ?4.4 7.2 ?4.0 100 Wrist - APB 7   25.8     Upper arm APB 7.2  7.2  100 Upper arm - Wrist 22 56 ?49 25.7  R Ulnar - ADM     Wrist ADM 2.6 ?3.3 7.8 ?6.0 100 Wrist - ADM 7   26.3     B.Elbow ADM 6.7  7.3  93.4 B.Elbow - Wrist 20 49 ?49 24.4     A.Elbow ADM 8.6  6.8  93.4 A.Elbow - B.Elbow 10 51 ?49 22.8         A.Elbow - Wrist      L Ulnar - ADM     Wrist ADM 2.3 ?3.3 8.0 ?6.0 100 Wrist - ADM 7   28.3     B.Elbow ADM 6.3  8.4  105 B.Elbow - Wrist 20 51 ?49 28.3     A.Elbow ADM 8.5  7.5  89.4 A.Elbow - B.Elbow 10 45 ?49 26.0         A.Elbow - Wrist      R Peroneal - EDB     Ankle EDB 3.9 ?6.5 9.7 ?2.0 100 Ankle - EDB 9   27.9     Fib head EDB 10.4  9.7  100 Fib head - Ankle 32 49 ?44 27.9     Pop fossa EDB 12.4  9.7  100 Pop fossa - Fib head 10 51 ?44 27.4         Pop fossa - Ankle      R Tibial - AH     Ankle AH 5.1 ?5.8 6.5 ?4.0 100 Ankle - AH 9   12.7     Pop fossa AH 12.8  3.1  47.6 Pop fossa - Ankle 35 45 ?41 12.3                 SNC    Nerve / Sites Rec. Site Peak Lat Ref.  Amp Ref. Segments Distance    ms ms V V  cm  R Sural - Ankle (Calf)     Calf Ankle 3.3 ?4.4 15 ?6 Calf - Ankle 14  R Superficial peroneal - Ankle     Lat leg Ankle 3.6 ?4.4 7 ?6 Lat leg - Ankle 14  R Median - Orthodromic (Dig II, Mid palm)     Dig II Wrist 2.9 ?3.4 13 ?10 Dig II - Wrist 13   L Median - Orthodromic (Dig II, Mid palm)     Dig II Wrist 3.1 ?3.4 18 ?10 Dig II - Wrist 13  R Ulnar - Orthodromic, (Dig V, Mid palm)     Dig V Wrist  2.7 ?3.1 6 ?5 Dig V - Wrist 11  L Ulnar - Orthodromic, (Dig V, Mid palm)     Dig V Wrist 2.7 ?3.1 3 ?5 Dig V - Wrist 52                 F  Wave    Nerve F Lat Ref.   ms ms  R Tibial - AH 48.9 ?56.0  R Ulnar - ADM 27.8 ?32.0  L Ulnar - ADM 30.5 ?32.0           EMG full

## 2018-03-11 NOTE — Progress Notes (Signed)
Please refer to EMG and nerve conduction study procedure note. 

## 2018-03-12 DIAGNOSIS — F419 Anxiety disorder, unspecified: Secondary | ICD-10-CM | POA: Diagnosis not present

## 2018-03-12 DIAGNOSIS — I10 Essential (primary) hypertension: Secondary | ICD-10-CM | POA: Diagnosis not present

## 2018-03-12 DIAGNOSIS — F329 Major depressive disorder, single episode, unspecified: Secondary | ICD-10-CM | POA: Diagnosis not present

## 2018-03-12 DIAGNOSIS — R55 Syncope and collapse: Secondary | ICD-10-CM | POA: Diagnosis not present

## 2018-03-16 ENCOUNTER — Ambulatory Visit: Payer: Medicare Other | Admitting: Adult Health

## 2018-04-02 DIAGNOSIS — B351 Tinea unguium: Secondary | ICD-10-CM | POA: Diagnosis not present

## 2018-04-02 DIAGNOSIS — M79675 Pain in left toe(s): Secondary | ICD-10-CM | POA: Diagnosis not present

## 2018-04-03 DIAGNOSIS — Z0000 Encounter for general adult medical examination without abnormal findings: Secondary | ICD-10-CM | POA: Diagnosis not present

## 2018-04-03 DIAGNOSIS — Z1231 Encounter for screening mammogram for malignant neoplasm of breast: Secondary | ICD-10-CM | POA: Diagnosis not present

## 2018-04-03 DIAGNOSIS — Z9181 History of falling: Secondary | ICD-10-CM | POA: Diagnosis not present

## 2018-04-03 DIAGNOSIS — E785 Hyperlipidemia, unspecified: Secondary | ICD-10-CM | POA: Diagnosis not present

## 2018-04-03 DIAGNOSIS — Z1331 Encounter for screening for depression: Secondary | ICD-10-CM | POA: Diagnosis not present

## 2018-04-03 DIAGNOSIS — Z6834 Body mass index (BMI) 34.0-34.9, adult: Secondary | ICD-10-CM | POA: Diagnosis not present

## 2018-04-09 DIAGNOSIS — R55 Syncope and collapse: Secondary | ICD-10-CM | POA: Diagnosis not present

## 2018-04-09 DIAGNOSIS — G4762 Sleep related leg cramps: Secondary | ICD-10-CM | POA: Diagnosis not present

## 2018-04-09 DIAGNOSIS — I10 Essential (primary) hypertension: Secondary | ICD-10-CM | POA: Diagnosis not present

## 2018-04-09 DIAGNOSIS — F419 Anxiety disorder, unspecified: Secondary | ICD-10-CM | POA: Diagnosis not present

## 2018-04-14 DIAGNOSIS — L309 Dermatitis, unspecified: Secondary | ICD-10-CM | POA: Diagnosis not present

## 2018-04-14 DIAGNOSIS — I10 Essential (primary) hypertension: Secondary | ICD-10-CM | POA: Diagnosis not present

## 2018-04-14 DIAGNOSIS — G4762 Sleep related leg cramps: Secondary | ICD-10-CM | POA: Diagnosis not present

## 2018-04-14 DIAGNOSIS — R55 Syncope and collapse: Secondary | ICD-10-CM | POA: Diagnosis not present

## 2018-04-20 DIAGNOSIS — L309 Dermatitis, unspecified: Secondary | ICD-10-CM | POA: Diagnosis not present

## 2018-04-20 DIAGNOSIS — G4722 Circadian rhythm sleep disorder, advanced sleep phase type: Secondary | ICD-10-CM | POA: Diagnosis not present

## 2018-04-20 DIAGNOSIS — R55 Syncope and collapse: Secondary | ICD-10-CM | POA: Diagnosis not present

## 2018-04-20 DIAGNOSIS — I10 Essential (primary) hypertension: Secondary | ICD-10-CM | POA: Diagnosis not present

## 2018-04-22 DIAGNOSIS — M79644 Pain in right finger(s): Secondary | ICD-10-CM | POA: Diagnosis not present

## 2018-04-22 DIAGNOSIS — S6991XA Unspecified injury of right wrist, hand and finger(s), initial encounter: Secondary | ICD-10-CM | POA: Diagnosis not present

## 2018-04-22 DIAGNOSIS — S60031A Contusion of right middle finger without damage to nail, initial encounter: Secondary | ICD-10-CM | POA: Diagnosis not present

## 2018-04-22 DIAGNOSIS — Z23 Encounter for immunization: Secondary | ICD-10-CM | POA: Diagnosis not present

## 2018-04-22 DIAGNOSIS — S61212A Laceration without foreign body of right middle finger without damage to nail, initial encounter: Secondary | ICD-10-CM | POA: Diagnosis not present

## 2018-04-26 DIAGNOSIS — Z79891 Long term (current) use of opiate analgesic: Secondary | ICD-10-CM | POA: Diagnosis not present

## 2018-04-26 DIAGNOSIS — Z7952 Long term (current) use of systemic steroids: Secondary | ICD-10-CM | POA: Diagnosis not present

## 2018-04-26 DIAGNOSIS — R55 Syncope and collapse: Secondary | ICD-10-CM | POA: Diagnosis not present

## 2018-04-26 DIAGNOSIS — E162 Hypoglycemia, unspecified: Secondary | ICD-10-CM | POA: Diagnosis not present

## 2018-04-26 DIAGNOSIS — E161 Other hypoglycemia: Secondary | ICD-10-CM | POA: Diagnosis not present

## 2018-04-26 DIAGNOSIS — I10 Essential (primary) hypertension: Secondary | ICD-10-CM | POA: Diagnosis not present

## 2018-04-26 DIAGNOSIS — R402441 Other coma, without documented Glasgow coma scale score, or with partial score reported, in the field [EMT or ambulance]: Secondary | ICD-10-CM | POA: Diagnosis not present

## 2018-04-26 DIAGNOSIS — E039 Hypothyroidism, unspecified: Secondary | ICD-10-CM | POA: Diagnosis not present

## 2018-04-26 DIAGNOSIS — Z79899 Other long term (current) drug therapy: Secondary | ICD-10-CM | POA: Diagnosis not present

## 2018-04-27 DIAGNOSIS — L501 Idiopathic urticaria: Secondary | ICD-10-CM | POA: Diagnosis not present

## 2018-04-29 DIAGNOSIS — E039 Hypothyroidism, unspecified: Secondary | ICD-10-CM | POA: Diagnosis not present

## 2018-04-29 DIAGNOSIS — F419 Anxiety disorder, unspecified: Secondary | ICD-10-CM | POA: Diagnosis not present

## 2018-04-29 DIAGNOSIS — I10 Essential (primary) hypertension: Secondary | ICD-10-CM | POA: Diagnosis not present

## 2018-04-29 DIAGNOSIS — R55 Syncope and collapse: Secondary | ICD-10-CM | POA: Diagnosis not present

## 2018-05-07 DIAGNOSIS — M1990 Unspecified osteoarthritis, unspecified site: Secondary | ICD-10-CM | POA: Diagnosis not present

## 2018-05-07 DIAGNOSIS — M2550 Pain in unspecified joint: Secondary | ICD-10-CM | POA: Diagnosis not present

## 2018-05-07 DIAGNOSIS — Z79899 Other long term (current) drug therapy: Secondary | ICD-10-CM | POA: Diagnosis not present

## 2018-05-13 DIAGNOSIS — R110 Nausea: Secondary | ICD-10-CM | POA: Diagnosis not present

## 2018-05-13 DIAGNOSIS — I10 Essential (primary) hypertension: Secondary | ICD-10-CM | POA: Diagnosis not present

## 2018-05-13 DIAGNOSIS — R55 Syncope and collapse: Secondary | ICD-10-CM | POA: Diagnosis not present

## 2018-05-13 DIAGNOSIS — F419 Anxiety disorder, unspecified: Secondary | ICD-10-CM | POA: Diagnosis not present

## 2018-05-25 DIAGNOSIS — I10 Essential (primary) hypertension: Secondary | ICD-10-CM | POA: Diagnosis not present

## 2018-05-25 DIAGNOSIS — G4762 Sleep related leg cramps: Secondary | ICD-10-CM | POA: Diagnosis not present

## 2018-05-25 DIAGNOSIS — R768 Other specified abnormal immunological findings in serum: Secondary | ICD-10-CM | POA: Diagnosis not present

## 2018-05-25 DIAGNOSIS — R55 Syncope and collapse: Secondary | ICD-10-CM | POA: Diagnosis not present

## 2018-06-01 ENCOUNTER — Encounter: Payer: Self-pay | Admitting: Gastroenterology

## 2018-06-02 ENCOUNTER — Encounter: Payer: Self-pay | Admitting: Gastroenterology

## 2018-06-02 ENCOUNTER — Ambulatory Visit (INDEPENDENT_AMBULATORY_CARE_PROVIDER_SITE_OTHER): Payer: Medicare Other | Admitting: Gastroenterology

## 2018-06-02 ENCOUNTER — Other Ambulatory Visit (INDEPENDENT_AMBULATORY_CARE_PROVIDER_SITE_OTHER): Payer: Medicare Other

## 2018-06-02 VITALS — BP 118/74 | HR 82 | Ht 66.0 in | Wt 211.0 lb

## 2018-06-02 DIAGNOSIS — R1012 Left upper quadrant pain: Secondary | ICD-10-CM

## 2018-06-02 DIAGNOSIS — R1013 Epigastric pain: Secondary | ICD-10-CM | POA: Diagnosis not present

## 2018-06-02 NOTE — Patient Instructions (Signed)
If you are age 46 or older, your body mass index should be between 23-30. Your Body mass index is 34.06 kg/m. If this is out of the aforementioned range listed, please consider follow up with your Primary Care Provider.  If you are age 57 or younger, your body mass index should be between 19-25. Your Body mass index is 34.06 kg/m. If this is out of the aformentioned range listed, please consider follow up with your Primary Care Provider.   You have been scheduled for a CT scan of the abdomen and pelvis at [facility] ([address]---this is in the same building as Press [facility]).   You are scheduled on 06/05/2018 at 9:15am. You should arrive 15 minutes prior to your appointment time for registration. Please follow the written instructions below on the day of your exam:  WARNING: IF YOU ARE ALLERGIC TO IODINE/X-RAY DYE, PLEASE NOTIFY RADIOLOGY IMMEDIATELY AT [phone]! YOU WILL BE GIVEN A 13 HOUR PREMEDICATION PREP.  1) Do not eat or drink anything after 5:15am (4 hours prior to your test) 2) You have been given 2 bottles of oral contrast to drink. The solution may taste better if refrigerated, but do NOT add ice or any other liquid to this solution. Shake well before drinking.    Drink 1 bottle of contrast @ 7:15am (2 hours prior to your exam)  Drink 1 bottle of contrast @ 8:15am (1 hour prior to your exam)  You may take any medications as prescribed with a small amount of water except for the following: Metformin, Glucophage, Glucovance, Avandamet, Riomet, Fortamet, Actoplus Met, Janumet, Glumetza or Metaglip. The above medications must be held the day of the exam AND 48 hours after the exam.  The purpose of you drinking the oral contrast is to aid in the visualization of your intestinal tract. The contrast solution may cause some diarrhea. Before your exam is started, you will be given a small amount of fluid to drink. Depending on your individual set of symptoms, you may  also receive an intravenous injection of x-ray contrast/dye. Plan on being at [facility] for 30 minutes or longer, depending on the type of exam you are having performed.  This test typically takes 30-45 minutes to complete.  If you have any questions regarding your exam or if you need to reschedule, you may call the CT department at [phone] between the hours of 8:00 am and 5:00 pm, Monday-Friday.  ________________________________________________________________________    Please stop at the lab before you leave the office today.   Please call Dr. Leland Her nurse Leeanne Rio, RN) at [phone] if you need anything.  Thank you,  Dr. Jackquline Denmark

## 2018-06-02 NOTE — Progress Notes (Signed)
Chief Complaint: Abdominal pain  Referring Provider:  Cher Nakai, MD      ASSESSMENT AND PLAN;   #1. LUQ and epigastric pain- negative EGD 08/29/2017 with negative esophageal biopsies for EoE. S/p laparoscopic cholecystectomy in the past. Neg solid-phase gastric emptying scan 08/2016  -Check CBC, CMP and lipase today. -Proceed with CT scan of the abdomen and pelvis with p.o. and IV contrast. -Continue omeprazole 40 mg p.o. once a day.  #2.  IBS with predominant constipation, negative colonoscopy 07/04/2017 except for small posterior anal fissure. -    HPI:    Kelly Haas is a 47 y.o. female  Epigastric pain, sudden onset, after soup at Daughter's birthday last Tuesday, Wednesday had waffle, then cramps Has associated nausea but no vomiting. Has been having occasional intermittent constipation.  However, she is having normal bowel movements currently. Denies having any significant abdominal bloating. No exposure to anyone with gastroenteritis. Had some subjective chills but no fever. She describes pain as severe, patient crying out of pain but wants to avoid ED Took hydrocodone with only minimal relief. Has taken Bentyl with minimal relief as well. No history of alcohol use.   Past Medical History:  Diagnosis Date  . Depression with anxiety   . Fibromyalgia   . G6PD deficiency ([place])   . Hypertension   . Kidney stone   . Migraine   . Seizure ([place])   . Seizure disorder ([place]) 11/13/2017  . Thyroid disease     Past Surgical History:  Procedure Laterality Date  . ABDOMINAL HYSTERECTOMY    . APPENDECTOMY    . CHOLECYSTECTOMY    . ESOPHAGOGASTRODUODENOSCOPY  08/29/2017   Mild gastritis. [place]auts post empiric esophageal dilitation.   . TOE SURGERY Left    removed toe nail  . TONSILLECTOMY AND ADENOIDECTOMY      Family History  Problem Relation Age of Onset  . Hypertension Mother   . Kidney cancer Mother   . Heart attack Father        21  . Seizures Father    . Stroke Maternal Grandmother   . Pancreatic cancer Maternal Grandfather   . Prostate cancer Maternal Grandfather   . Lung cancer Maternal Grandfather     Social History   Tobacco Use  . Smoking status: Never Smoker  . Smokeless tobacco: Never Used  Substance Use Topics  . Alcohol use: No    Frequency: Never  . Drug use: No    Current Outpatient Medications  Medication Sig Dispense Refill  . amLODipine (NORVASC) 5 MG tablet Take by mouth.    . cyclobenzaprine (FLEXERIL) 10 MG tablet Take 1 tablet (10 mg total) by mouth 3 (three) times daily as needed for Muscle spasms.  3  . dicyclomine (BENTYL) 10 MG capsule Take by mouth.    . divalproex (DEPAKOTE) 500 MG DR tablet Take 500 mg by mouth 2 (two) times daily.    Marland Kitchen EPINEPHrine 0.3 mg/0.3 mL IJ SOAJ injection Inject into the muscle.    . escitalopram (LEXAPRO) 20 MG tablet Take by mouth.    . folic acid (FOLVITE) 1 MG tablet Take 1 mg by mouth daily.  3  . gabapentin (NEURONTIN) 600 MG tablet Take 600 mg by mouth 3 (three) times daily.  5  . levothyroxine (SYNTHROID, LEVOTHROID) 50 MCG tablet Take by mouth every other day.    . levothyroxine (SYNTHROID, LEVOTHROID) 75 MCG tablet Take by mouth every other day.    Marland Kitchen LYRICA 50 MG capsule  Take 1 capsule by mouth 2 (two) times daily.   5  . methotrexate (RHEUMATREX) 2.5 MG tablet Take 8 tabs once weekly    . omeprazole (PRILOSEC) 40 MG capsule Take 40 mg by mouth daily.  5  . predniSONE (DELTASONE) 5 MG tablet Take 5 mg by mouth as needed.    . ranitidine (ZANTAC) 150 MG tablet Take 300 mg by mouth at bedtime.  4  . zolpidem (AMBIEN) 10 MG tablet Take 10 mg by mouth at bedtime as needed. for sleep  0   No current facility-administered medications for this visit.     Allergies  Allergen Reactions  . Bee Venom Anaphylaxis  . Ibuprofen Hives, Nausea And Vomiting and Rash    Other reaction(s): Vomiting (intolerance)  . Acetaminophen   . Aminolevulinate Derivatives   .  Aspirin-Acetaminophen-Caffeine   . Dapsone     Other reaction(s): Other (See Comments)  . Dimercaprol     Other reaction(s): Other (See Comments)  . Methylcellulose   . Nitrofuran Derivatives   . Phenazopyridine   . Primaquine   . Toluidine Blue   . Latex Rash  . Oxycodone Rash  . Phenergan [Promethazine Hcl] Rash    Review of Systems:  Reviewed     Physical Exam:    BP 118/74   Pulse 82   Ht 5\' 6"  (1.676 m)   Wt 211 lb (95.7 kg)   BMI 34.06 kg/m  Filed Weights   06/02/18 1506  Weight: 211 lb (95.7 kg)   Constitutional:  Well-developed, in no acute distress.  But tearful Psychiatric: Normal mood and affect. Behavior is normal. HEENT: Pupils normal.  Conjunctivae are normal. No scleral icterus. Neck supple.  Cardiovascular: Normal rate, regular rhythm. No edema Pulmonary/chest: Effort normal and breath sounds normal. No wheezing, rales or rhonchi. Abdominal: Soft, nondistended.  Epigastric and left upper quadrant abdominal tenderness.. Bowel sounds active throughout. There are no masses palpable. No hepatomegaly. Rectal:  defered Neurological: Alert and oriented to person place and time. Skin: Skin is warm and dry. No rashes noted.  Data Reviewed: I have personally reviewed following labs and imaging studies  CBC: CBC Latest Ref Rng & Units 11/24/2017 10/21/2008  WBC 3.4 - 10.8 x10E3/uL 6.5 -  Hemoglobin 11.1 - 15.9 g/dL 13.3 13.9  Hematocrit 34.0 - 46.6 % 40.3 41.0  Platelets 150 - 379 x10E3/uL 354 -    CMP: CMP Latest Ref Rng & Units 11/24/2017 10/21/2008  Glucose 65 - 99 mg/dL 87 112(H)  BUN 6 - 24 mg/dL 7 6  Creatinine 0.57 - 1.00 mg/dL 0.82 1.0  Sodium 134 - 144 mmol/L 145(H) 143  Potassium 3.5 - 5.2 mmol/L 4.5 4.4  Chloride 96 - 106 mmol/L 107(H) 106  CO2 20 - 29 mmol/L 22 -  Calcium 8.7 - 10.2 mg/dL 9.6 -  Total Protein 6.0 - 8.5 g/dL 7.0 -  Total Bilirubin 0.0 - 1.2 mg/dL 0.3 -  Alkaline Phos 39 - 117 IU/L 81 -  AST 0 - 40 IU/L 15 -  ALT 0 - 32 IU/L  14 -  Seen in presence of [employer] CMA.     Carmell Austria, MD 06/02/2018, 3:25 PM  Cc: Cher Nakai, MD

## 2018-06-03 LAB — CBC WITH DIFFERENTIAL/PLATELET
BASOS ABS: 0.1 10*3/uL (ref 0.0–0.1)
Basophils Relative: 0.7 % (ref 0.0–3.0)
EOS PCT: 0.4 % (ref 0.0–5.0)
Eosinophils Absolute: 0 10*3/uL (ref 0.0–0.7)
HCT: 42.4 % (ref 36.0–46.0)
Hemoglobin: 14.6 g/dL (ref 12.0–15.0)
Lymphocytes Relative: 24.1 % (ref 12.0–46.0)
Lymphs Abs: 1.7 10*3/uL (ref 0.7–4.0)
MCHC: 34.3 g/dL (ref 30.0–36.0)
MCV: 96.2 fl (ref 78.0–100.0)
MONOS PCT: 6.1 % (ref 3.0–12.0)
Monocytes Absolute: 0.4 10*3/uL (ref 0.1–1.0)
NEUTROS PCT: 68.7 % (ref 43.0–77.0)
Neutro Abs: 4.9 10*3/uL (ref 1.4–7.7)
Platelets: 329 10*3/uL (ref 150.0–400.0)
RBC: 4.41 Mil/uL (ref 3.87–5.11)
RDW: 13.9 % (ref 11.5–15.5)
WBC: 7.1 10*3/uL (ref 4.0–10.5)

## 2018-06-03 LAB — LIPASE: LIPASE: 7 U/L — AB (ref 11.0–59.0)

## 2018-06-03 LAB — COMPREHENSIVE METABOLIC PANEL
ALBUMIN: 4.2 g/dL (ref 3.5–5.2)
ALK PHOS: 84 U/L (ref 39–117)
ALT: 14 U/L (ref 0–35)
AST: 12 U/L (ref 0–37)
BILIRUBIN TOTAL: 0.4 mg/dL (ref 0.2–1.2)
BUN: 9 mg/dL (ref 6–23)
CALCIUM: 9.6 mg/dL (ref 8.4–10.5)
CO2: 31 mEq/L (ref 19–32)
Chloride: 103 mEq/L (ref 96–112)
Creatinine, Ser: 1.02 mg/dL (ref 0.40–1.20)
GFR: 74.86 mL/min (ref >60.00)
Glucose, Bld: 97 mg/dL (ref 70–99)
POTASSIUM: 4.7 meq/L (ref 3.5–5.1)
Sodium: 140 mEq/L (ref 135–145)
TOTAL PROTEIN: 7.1 g/dL (ref 6.0–8.3)

## 2018-06-05 ENCOUNTER — Telehealth: Payer: Self-pay | Admitting: Gastroenterology

## 2018-06-05 ENCOUNTER — Ambulatory Visit (INDEPENDENT_AMBULATORY_CARE_PROVIDER_SITE_OTHER)
Admission: RE | Admit: 2018-06-05 | Discharge: 2018-06-05 | Disposition: A | Discharge status: Home / Self Care | Payer: Medicare Other | Source: Ambulatory Visit | Attending: Gastroenterology | Admitting: Gastroenterology

## 2018-06-05 DIAGNOSIS — R112 Nausea with vomiting, unspecified: Secondary | ICD-10-CM | POA: Diagnosis not present

## 2018-06-05 DIAGNOSIS — R1013 Epigastric pain: Secondary | ICD-10-CM | POA: Diagnosis not present

## 2018-06-05 DIAGNOSIS — R1012 Left upper quadrant pain: Secondary | ICD-10-CM | POA: Diagnosis not present

## 2018-06-05 IMAGING — CT CT ABD-PELV W/ CM
2 of 5 series · 16 of 46 positions shown, 18 images · IV contrast (ISOVUE 300)
Comparison: [DATE]

CLINICAL DATA: Left upper quadrant pain, nausea, and vomiting for
10 days. Previous cholecystectomy, hysterectomy, and appendectomy

EXAM:
CT ABDOMEN AND PELVIS WITH CONTRAST
TECHNIQUE: Multidetector CT imaging of the abdomen and pelvis was performed
using the standard protocol following bolus administration of
intravenous contrast.
CONTRAST:  100mL [5G] IOPAMIDOL ([5G]) INJECTION 61%

[Series 2: abd/pel w · axial · 0.79mm/px · z∈[-455,-5]mm · 13 of 101 slices shown, 15 images]
[im 6/101  soft-tissue]
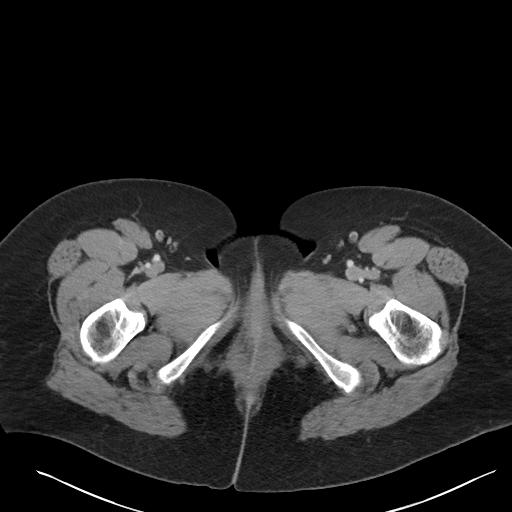
[im 6/101  bone]
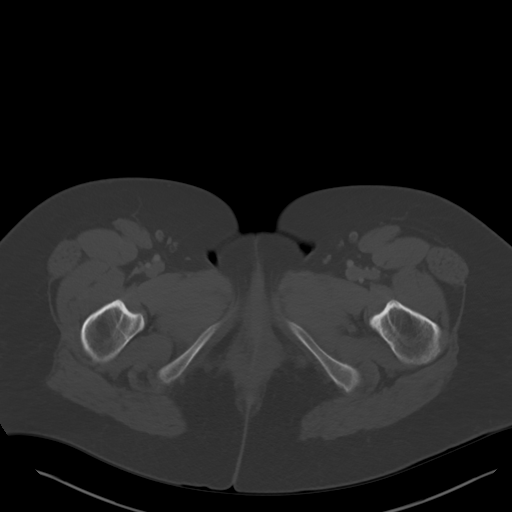
[im 16/101  soft-tissue]
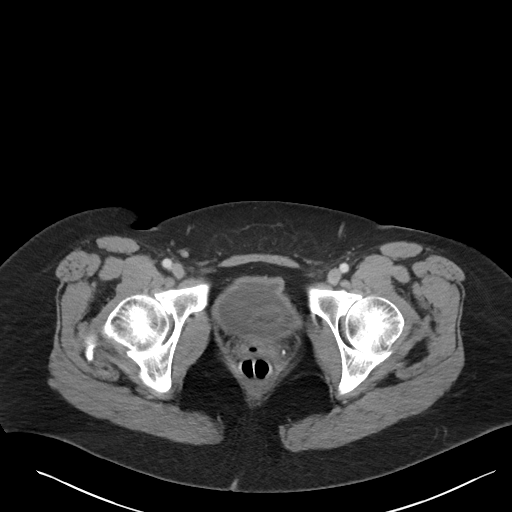
[im 21/101  soft-tissue]
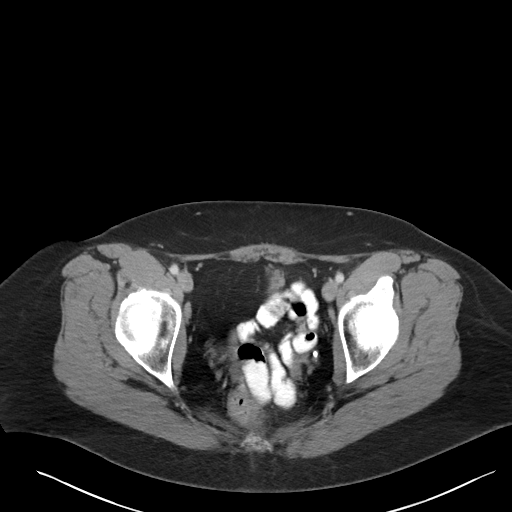
[im 31/101  soft-tissue]
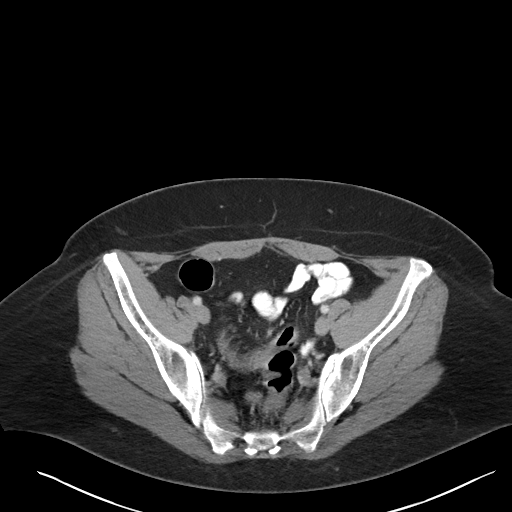
[im 36/101  soft-tissue]
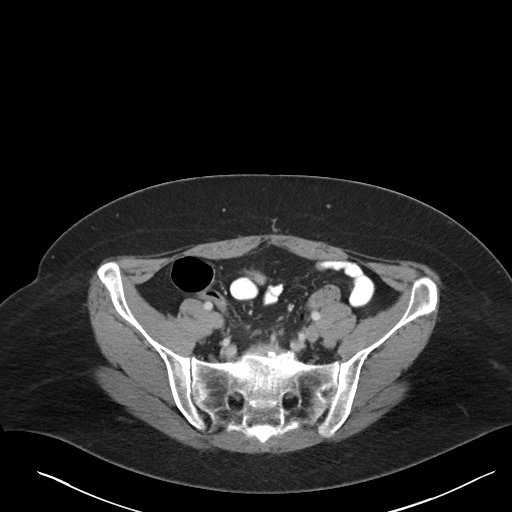
[im 46/101  soft-tissue]
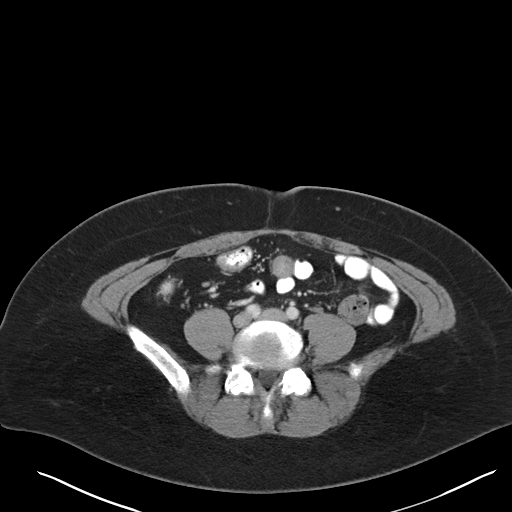
[im 51/101  soft-tissue]
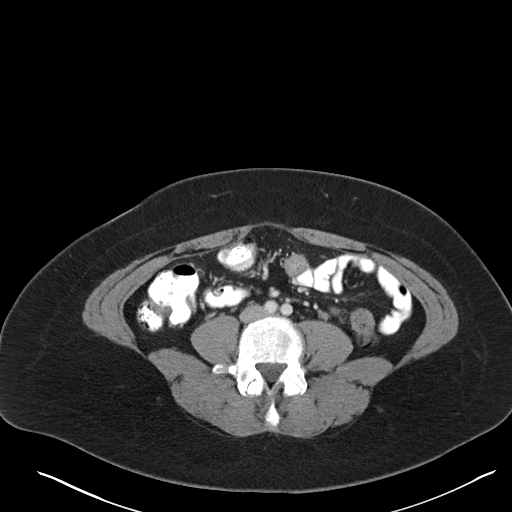
[im 56/101  soft-tissue]
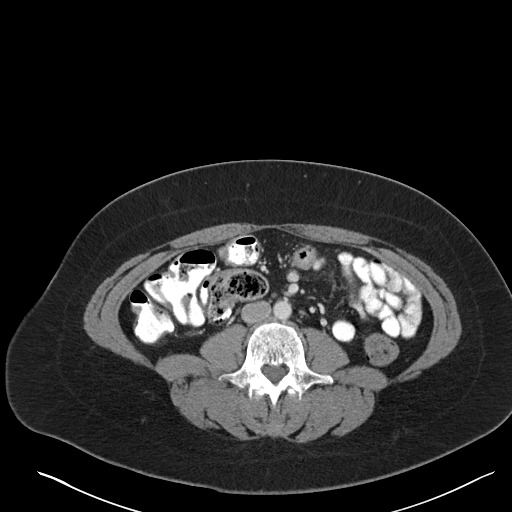
[im 66/101  soft-tissue]
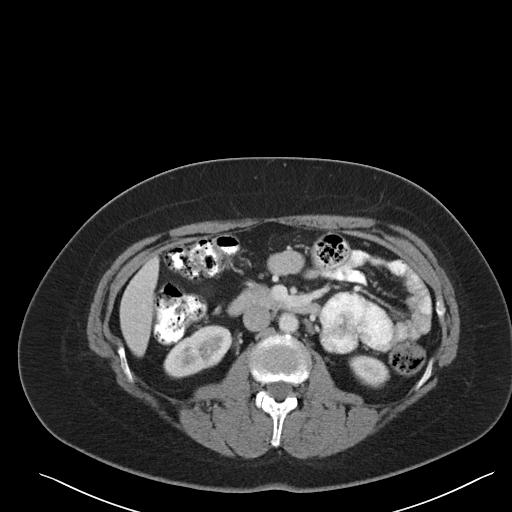
[im 66/101  bone]
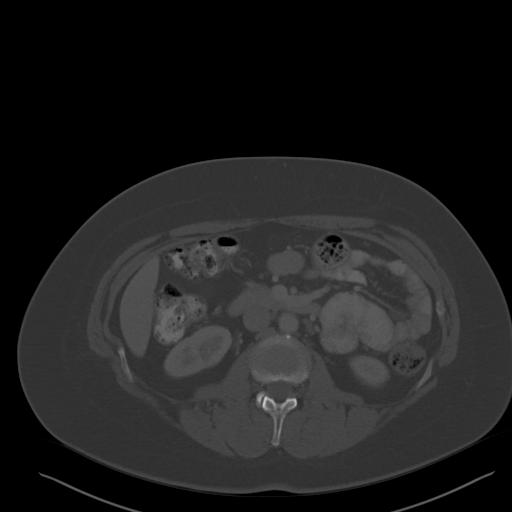
[im 71/101  soft-tissue]
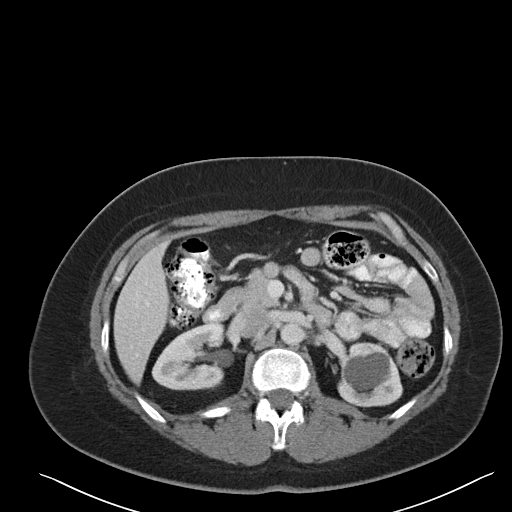
[im 81/101  soft-tissue]
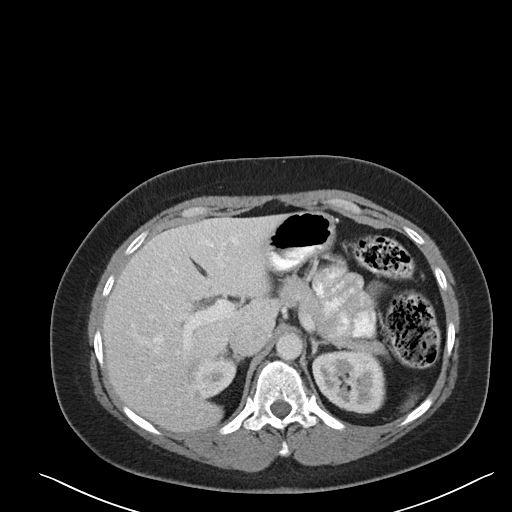
[im 86/101  soft-tissue]
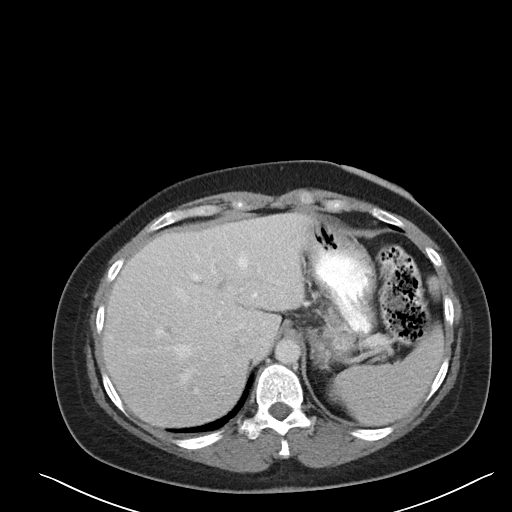
[im 96/101  soft-tissue]
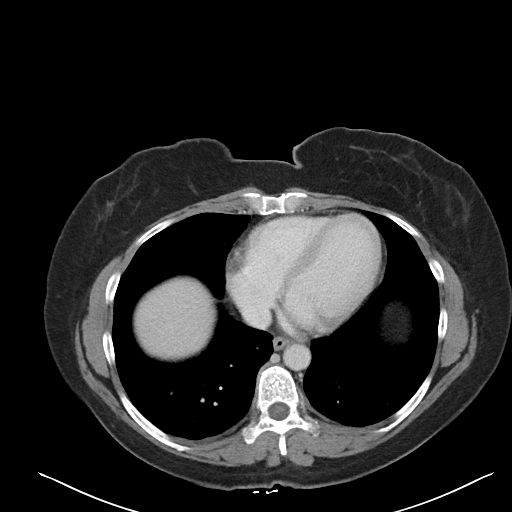

[Series 6: abd/pel w st · coronal · 0.72mm/px · 3 of 80 slices shown]
[im 27/80  soft-tissue]
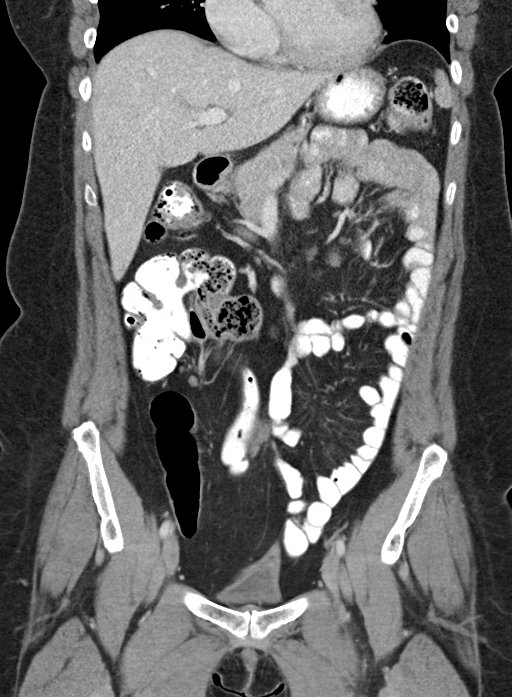
[im 36/80  soft-tissue]
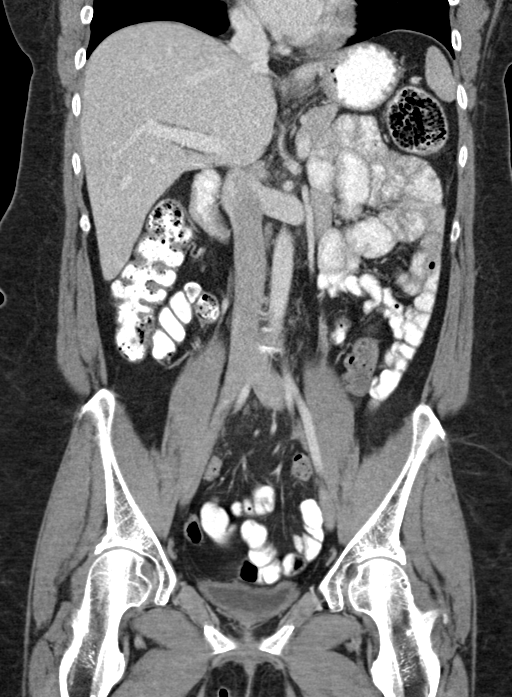
[im 44/80  soft-tissue]
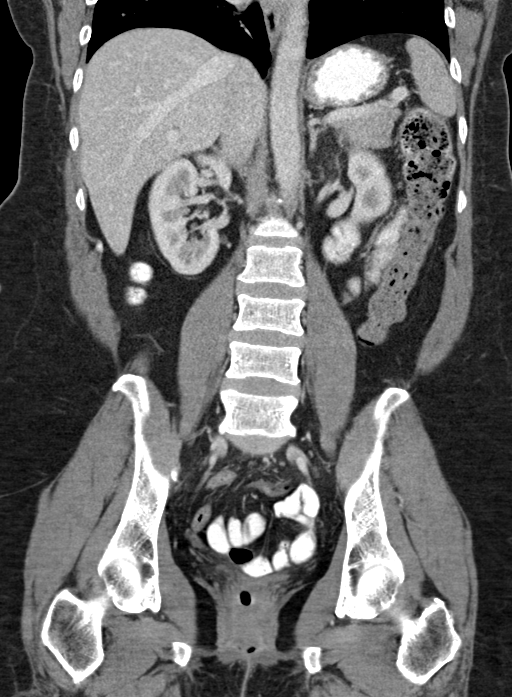

[16 of 46 positions shown; findings below may reference images not displayed]

FINDINGS: Lower Chest: No acute findings.

Hepatobiliary: No hepatic masses identified. Prior cholecystectomy.
No evidence of biliary obstruction.

Pancreas:  No mass or inflammatory changes.

Spleen: Within normal limits in size and appearance.

Adrenals/Urinary Tract: No masses identified. Stable bilateral renal
cysts. No evidence of hydronephrosis.

Stomach/Bowel: No evidence of obstruction, inflammatory process or
abnormal fluid collections.

Vascular/Lymphatic: No pathologically enlarged lymph nodes. No
abdominal aortic aneurysm.

Reproductive: Prior hysterectomy noted. Adnexal regions are
unremarkable in appearance.

Other:  None.

Musculoskeletal:  No suspicious bone lesions identified.
IMPRESSION: Stable exam.  No acute findings or other significant abnormality.

## 2018-06-05 MED ORDER — IOPAMIDOL (ISOVUE-300) INJECTION 61%
100.0000 mL | Freq: Once | INTRAVENOUS | Status: AC | PRN
  Administered 2018-06-05: 100 mL via INTRAVENOUS

## 2018-06-05 NOTE — Telephone Encounter (Signed)
Calling for CT results.  Please advise.  Thank you.

## 2018-06-08 ENCOUNTER — Telehealth: Payer: Self-pay

## 2018-06-08 DIAGNOSIS — R55 Syncope and collapse: Secondary | ICD-10-CM | POA: Diagnosis not present

## 2018-06-08 DIAGNOSIS — R768 Other specified abnormal immunological findings in serum: Secondary | ICD-10-CM | POA: Diagnosis not present

## 2018-06-08 DIAGNOSIS — I10 Essential (primary) hypertension: Secondary | ICD-10-CM | POA: Diagnosis not present

## 2018-06-08 DIAGNOSIS — G4762 Sleep related leg cramps: Secondary | ICD-10-CM | POA: Diagnosis not present

## 2018-06-08 NOTE — Telephone Encounter (Signed)
I spoke with her this morning and she is aware of the CT result but is wanting to know what the next step(s) is?  Please advise.  Thank  you.

## 2018-06-08 NOTE — Telephone Encounter (Signed)
Patient notified by phone.

## 2018-06-08 NOTE — Telephone Encounter (Signed)
Start MiraLAX 17 g by mouth once a day for now She has a tendency towards constipation

## 2018-07-06 ENCOUNTER — Telehealth: Payer: Self-pay | Admitting: Gastroenterology

## 2018-07-06 ENCOUNTER — Other Ambulatory Visit: Payer: Self-pay

## 2018-07-06 DIAGNOSIS — I10 Essential (primary) hypertension: Secondary | ICD-10-CM | POA: Diagnosis not present

## 2018-07-06 DIAGNOSIS — R55 Syncope and collapse: Secondary | ICD-10-CM | POA: Diagnosis not present

## 2018-07-06 DIAGNOSIS — G4762 Sleep related leg cramps: Secondary | ICD-10-CM | POA: Diagnosis not present

## 2018-07-06 DIAGNOSIS — R768 Other specified abnormal immunological findings in serum: Secondary | ICD-10-CM | POA: Diagnosis not present

## 2018-07-06 DIAGNOSIS — Z1339 Encounter for screening examination for other mental health and behavioral disorders: Secondary | ICD-10-CM | POA: Diagnosis not present

## 2018-07-06 NOTE — Telephone Encounter (Signed)
Let's try suprep

## 2018-07-06 NOTE — Telephone Encounter (Signed)
I spoke with the patient and she reports that she just can't keep the Miralax down.  Would like another option.  Please advise.  Thank you.

## 2018-07-06 NOTE — Telephone Encounter (Signed)
She was trying to take 17 gm of Miralax daily.  What is the sig for the Suprep?  Please advise.  Thank you.

## 2018-07-06 NOTE — Telephone Encounter (Signed)
Pt states that she cannot do miralax, she tried but she ended up throwing up. She wants to know if there is an alternative.

## 2018-07-07 DIAGNOSIS — B351 Tinea unguium: Secondary | ICD-10-CM | POA: Diagnosis not present

## 2018-07-07 DIAGNOSIS — M79675 Pain in left toe(s): Secondary | ICD-10-CM | POA: Diagnosis not present

## 2018-07-08 ENCOUNTER — Other Ambulatory Visit: Payer: Self-pay

## 2018-07-08 DIAGNOSIS — R1012 Left upper quadrant pain: Secondary | ICD-10-CM

## 2018-07-08 MED ORDER — SENNOSIDES-DOCUSATE SODIUM 8.6-50 MG PO TABS: 1.0000 | ORAL_TABLET | Freq: Two times a day (BID) | ORAL | 1 refills | Status: DC

## 2018-07-08 NOTE — Telephone Encounter (Signed)
I thought she was coming for colonoscopy Lets try Peri-Colace 1 tablet p.o. twice daily Increase water intake

## 2018-07-08 NOTE — Telephone Encounter (Signed)
Patient notified by phone of new prescription and the prescription was sent to her pharmacy.

## 2018-07-27 DIAGNOSIS — L501 Idiopathic urticaria: Secondary | ICD-10-CM | POA: Diagnosis not present

## 2018-07-27 DIAGNOSIS — L299 Pruritus, unspecified: Secondary | ICD-10-CM | POA: Diagnosis not present

## 2018-07-30 DIAGNOSIS — H43813 Vitreous degeneration, bilateral: Secondary | ICD-10-CM | POA: Diagnosis not present

## 2018-07-30 DIAGNOSIS — I10 Essential (primary) hypertension: Secondary | ICD-10-CM | POA: Diagnosis not present

## 2018-07-30 DIAGNOSIS — H524 Presbyopia: Secondary | ICD-10-CM | POA: Diagnosis not present

## 2018-07-30 DIAGNOSIS — H52222 Regular astigmatism, left eye: Secondary | ICD-10-CM | POA: Diagnosis not present

## 2018-08-07 DIAGNOSIS — E785 Hyperlipidemia, unspecified: Secondary | ICD-10-CM | POA: Diagnosis not present

## 2018-08-07 DIAGNOSIS — R55 Syncope and collapse: Secondary | ICD-10-CM | POA: Diagnosis not present

## 2018-08-07 DIAGNOSIS — G4762 Sleep related leg cramps: Secondary | ICD-10-CM | POA: Diagnosis not present

## 2018-08-07 DIAGNOSIS — I10 Essential (primary) hypertension: Secondary | ICD-10-CM | POA: Diagnosis not present

## 2018-08-07 DIAGNOSIS — R531 Weakness: Secondary | ICD-10-CM | POA: Diagnosis not present

## 2018-08-07 DIAGNOSIS — G5620 Lesion of ulnar nerve, unspecified upper limb: Secondary | ICD-10-CM | POA: Diagnosis not present

## 2018-08-14 DIAGNOSIS — J392 Other diseases of pharynx: Secondary | ICD-10-CM | POA: Diagnosis not present

## 2018-08-14 DIAGNOSIS — R0981 Nasal congestion: Secondary | ICD-10-CM | POA: Diagnosis not present

## 2018-08-14 DIAGNOSIS — K219 Gastro-esophageal reflux disease without esophagitis: Secondary | ICD-10-CM | POA: Diagnosis not present

## 2018-08-14 DIAGNOSIS — J342 Deviated nasal septum: Secondary | ICD-10-CM | POA: Diagnosis not present

## 2018-08-14 DIAGNOSIS — R490 Dysphonia: Secondary | ICD-10-CM | POA: Diagnosis not present

## 2018-08-14 DIAGNOSIS — J343 Hypertrophy of nasal turbinates: Secondary | ICD-10-CM | POA: Diagnosis not present

## 2018-08-25 ENCOUNTER — Ambulatory Visit: Payer: Medicare Other | Admitting: Adult Health

## 2018-08-25 ENCOUNTER — Telehealth: Payer: Self-pay | Admitting: *Deleted

## 2018-08-25 NOTE — Telephone Encounter (Signed)
Patient called today and cancelled then reschedule her follow up. She stated she had no transportation today.

## 2018-08-26 ENCOUNTER — Encounter: Payer: Self-pay | Admitting: Adult Health

## 2018-08-27 ENCOUNTER — Ambulatory Visit (INDEPENDENT_AMBULATORY_CARE_PROVIDER_SITE_OTHER): Payer: Medicare Other | Admitting: Adult Health

## 2018-08-27 ENCOUNTER — Encounter: Payer: Self-pay | Admitting: Adult Health

## 2018-08-27 VITALS — BP 118/74 | HR 83 | Ht 66.0 in | Wt 213.4 lb

## 2018-08-27 DIAGNOSIS — Z5181 Encounter for therapeutic drug level monitoring: Secondary | ICD-10-CM

## 2018-08-27 DIAGNOSIS — B351 Tinea unguium: Secondary | ICD-10-CM | POA: Diagnosis not present

## 2018-08-27 DIAGNOSIS — G40909 Epilepsy, unspecified, not intractable, without status epilepticus: Secondary | ICD-10-CM

## 2018-08-27 DIAGNOSIS — M79675 Pain in left toe(s): Secondary | ICD-10-CM | POA: Diagnosis not present

## 2018-08-27 NOTE — Patient Instructions (Signed)
Your Plan:  Take Depakote as prescribed- don't skip doses If your symptoms worsen or you develop new symptoms please let us know.   Thank you for coming to see [place] at [facility]. I hope we have been able to provide you high quality care today.  You may receive a patient satisfaction survey over the next few weeks. We would appreciate your feedback and comments so that we may continue to improve ourselves and the health of our patients.

## 2018-08-27 NOTE — Progress Notes (Signed)
Fax confirmation received for [facility], Dr, Humphrey Rolls  [phone], [facility] report per pt request.  [facility] Release signed by pt.

## 2018-08-27 NOTE — Progress Notes (Signed)
PATIENT: Kelly Haas DOB: 12/07/1970  REASON FOR VISIT: follow up HISTORY FROM: patient  HISTORY OF PRESENT ILLNESS: Today 08/27/18:  Kelly Haas is a 47 year old female with a history of seizures.  She returns today for follow-up.  She reports that she has not had any seizure events.  She does state that in the last couple weeks she has had several episodes where she has woken up and she has chewed on her jaws during the night.  She is unsure if this is seizure events.  She does not have anybody that sleeps with her.  She does states that she does not feel any different the next day.  She does note that sometimes she will miss a dose of Depakote.  Reports that she typically will miss a morning dose.  At the last visit she was complaining of numbness in hands.  Nerve conduction studies was done.  Dr. Jannifer Franklin recommended an athletic elbow pad for the left arm for possible ulnar neuropathy.  The patient has not got this.  She states that she continues to have numbness intermittently.  She returns today for evaluation.  HISTORY Kelly Haas is a 47 year old right-handed black female with a history of seizures, the patient currently is taking Depakote for this.  She reports no seizures since last seen.  She comes back today for a new problem, referred by her rheumatologist.  The patient has noted over the last 2 months that she has had cold sensations in the fourth and fifth fingers of the hands bilaterally.  The patient recently underwent MRI of the brain because of a left hemisensory deficit, the MRI was normal.  The patient also reports some occasional tingling in the feet that may come and go.  The patient reports no weakness, she has not had any falls, she has not had any change in control of the bowels or the bladder.  She reports no significant neck pain or low back pain.  Given the new sensory findings, she returns for further evaluation.   REVIEW OF SYSTEMS: Out of a complete 14 system review  of symptoms, the patient complains only of the following symptoms, and all other reviewed systems are negative.  See HPI  ALLERGIES: Allergies  Allergen Reactions  . Bee Venom Anaphylaxis  . Ibuprofen Hives, Nausea And Vomiting and Rash    Other reaction(s): Vomiting (intolerance)  . Acetaminophen   . Aminolevulinate Derivatives   . Aspirin-Acetaminophen-Caffeine   . Dapsone     Other reaction(s): Other (See Comments)  . Dimercaprol     Other reaction(s): Other (See Comments)  . Methylcellulose   . Nitrofuran Derivatives   . Phenazopyridine   . Primaquine   . Toluidine Blue   . Latex Rash  . Oxycodone Rash  . Phenergan [Promethazine Hcl] Rash    HOME MEDICATIONS: Outpatient Medications Prior to Visit  Medication Sig Dispense Refill  . amLODipine (NORVASC) 5 MG tablet Take by mouth.    . cyclobenzaprine (FLEXERIL) 10 MG tablet Take 1 tablet (10 mg total) by mouth 3 (three) times daily as needed for Muscle spasms.  3  . dicyclomine (BENTYL) 10 MG capsule Take by mouth.    . divalproex (DEPAKOTE) 500 MG DR tablet Take 500 mg by mouth 2 (two) times daily.    Marland Kitchen EPINEPHrine 0.3 mg/0.3 mL IJ SOAJ injection Inject into the muscle.    . escitalopram (LEXAPRO) 20 MG tablet Take by mouth.    . folic acid (FOLVITE) 1 MG  tablet Take 1 mg by mouth daily.  3  . levothyroxine (SYNTHROID, LEVOTHROID) 50 MCG tablet Take by mouth every other day.    . levothyroxine (SYNTHROID, LEVOTHROID) 75 MCG tablet Take by mouth every other day.    Marland Kitchen LYRICA 50 MG capsule Take 1 capsule by mouth 2 (two) times daily.   5  . methotrexate (RHEUMATREX) 2.5 MG tablet Take 8 tabs once weekly    . omeprazole (PRILOSEC) 40 MG capsule Take 40 mg by mouth daily.  5  . predniSONE (DELTASONE) 5 MG tablet Take 5 mg by mouth as needed.    . ranitidine (ZANTAC) 150 MG tablet Take 300 mg by mouth at bedtime.  4  . senna-docusate (SENOKOT-S) 8.6-50 MG tablet Take 1 tablet by mouth 2 (two) times daily. 180 tablet 1  .  zolpidem (AMBIEN) 10 MG tablet Take 10 mg by mouth at bedtime as needed. for sleep  0  . gabapentin (NEURONTIN) 600 MG tablet Take 600 mg by mouth 3 (three) times daily.  5   No facility-administered medications prior to visit.     PAST MEDICAL HISTORY: Past Medical History:  Diagnosis Date  . Depression with anxiety   . Fibromyalgia   . G6PD deficiency ([place])   . Hypertension   . Kidney stone   . Migraine   . Seizure ([place])   . Seizure disorder ([place]) 11/13/2017  . Thyroid disease     PAST SURGICAL HISTORY: Past Surgical History:  Procedure Laterality Date  . ABDOMINAL HYSTERECTOMY    . APPENDECTOMY    . CHOLECYSTECTOMY    . ESOPHAGOGASTRODUODENOSCOPY  08/29/2017   Mild gastritis. Stauts post empiric esophageal dilitation.   . TOE SURGERY Left    removed toe nail  . TONSILLECTOMY AND ADENOIDECTOMY      FAMILY HISTORY: Family History  Problem Relation Age of Onset  . Hypertension Mother   . Kidney cancer Mother   . Heart attack Father        20  . Seizures Father   . Stroke Maternal Grandmother   . Pancreatic cancer Maternal Grandfather   . Prostate cancer Maternal Grandfather   . Lung cancer Maternal Grandfather     SOCIAL HISTORY: Social History   Socioeconomic History  . Marital status: Divorced    Spouse name: Not on file  . Number of children: 3  . Years of education: 41  . Highest education level: Associate degree: occupational, Hotel manager, or vocational program  Occupational History  . Occupation: Disabled  Social Needs  . Financial resource strain: Not on file  . Food insecurity:    Worry: Not on file    Inability: Not on file  . Transportation needs:    Medical: Not on file    Non-medical: Not on file  Tobacco Use  . Smoking status: Never Smoker  . Smokeless tobacco: Never Used  Substance and Sexual Activity  . Alcohol use: No    Frequency: Never  . Drug use: No  . Sexual activity: Not on file  Lifestyle  . Physical activity:    Days  per week: Not on file    Minutes per session: Not on file  . Stress: Not on file  Relationships  . Social connections:    Talks on phone: Not on file    Gets together: Not on file    Attends religious service: Not on file    Active member of club or organization: Not on file    Attends meetings of  clubs or organizations: Not on file    Relationship status: Not on file  . Intimate partner violence:    Fear of current or ex partner: Not on file    Emotionally abused: Not on file    Physically abused: Not on file    Forced sexual activity: Not on file  Other Topics Concern  . Not on file  Social History Narrative   Lives at home with daughter, son and grandson.   Right-handed.   No caffeine use.      PHYSICAL EXAM  Vitals:   08/27/18 0916  BP: 118/74  Pulse: 83  Weight: 213 lb 6.4 oz (96.8 kg)  Height: 5\' 6"  (1.676 m)   Body mass index is 34.44 kg/m.  Generalized: Well developed, in no acute distress   Neurological examination  Mentation: Alert oriented to time, place, history taking. Follows all commands speech and language fluent Cranial nerve II-XII: Pupils were equal round reactive to light. Extraocular movements were full, visual field were full on confrontational test. Facial sensation and strength were normal. Uvula tongue midline. Head turning and shoulder shrug  were normal and symmetric. Motor: The motor testing reveals 5 over 5 strength of all 4 extremities. Good symmetric motor tone is noted throughout.  Sensory: Sensory testing is intact to soft touch on all 4 extremities. No evidence of extinction is noted.  Coordination: Cerebellar testing reveals good finger-nose-finger and heel-to-shin bilaterally.  Gait and station: Gait is normal.  Reflexes: Deep tendon reflexes are symmetric and normal bilaterally.   DIAGNOSTIC DATA (LABS, IMAGING, TESTING) - I reviewed patient records, labs, notes, testing and imaging myself where available.  Lab Results    Component Value Date   WBC 7.1 06/02/2018   HGB 14.6 06/02/2018   HCT 42.4 06/02/2018   MCV 96.2 06/02/2018   PLT 329.0 06/02/2018      Component Value Date/Time   NA 140 06/02/2018 1608   NA 145 (H) 11/24/2017 1519   K 4.7 06/02/2018 1608   CL 103 06/02/2018 1608   CO2 31 06/02/2018 1608   GLUCOSE 97 06/02/2018 1608   BUN 9 06/02/2018 1608   BUN 7 11/24/2017 1519   CREATININE 1.02 06/02/2018 1608   CALCIUM 9.6 06/02/2018 1608   PROT 7.1 06/02/2018 1608   PROT 7.0 11/24/2017 1519   ALBUMIN 4.2 06/02/2018 1608   ALBUMIN 4.1 11/24/2017 1519   AST 12 06/02/2018 1608   ALT 14 06/02/2018 1608   ALKPHOS 84 06/02/2018 1608   BILITOT 0.4 06/02/2018 1608   BILITOT 0.3 11/24/2017 1519   GFRNONAA 87 11/24/2017 1519   GFRAA 100 11/24/2017 1519     ASSESSMENT AND PLAN 47 y.o. year old female  has a past medical history of Depression with anxiety, Fibromyalgia, G6PD deficiency ([place]), Hypertension, Kidney stone, Migraine, Seizure ([place]), Seizure disorder ([place]) (11/13/2017), and Thyroid disease. here with:  1.  Seizures 2.  Paresthesias  Overall the patient is doing well.  She will continue on Depakote.  Advised that she should not miss any doses.  She should be consistent with the medication.  I will check blood work today.  Not sure if the episodes at night represent true seizure events.  I have advised the patient to be consistent with her medication as missing doses can cause a seizure event.  If she continues to have these episodes during the night she will let me know.  In regards to the numbness she is encouraged to get an athletic elbow pad as  Dr. Jannifer Franklin recommended.  She is advised that if her symptoms worsen or she develops new symptoms she should let us know.  She will follow-up in 6 months or sooner if needed   Ward Givens, MSN, NP-C 08/27/2018, 9:33 AM [facility] [address] [place], [place] [address] [phone]

## 2018-08-27 NOTE — Progress Notes (Signed)
I have read the note, and I agree with the clinical assessment and plan.  Thoams Siefert K Brittanee Ghazarian   

## 2018-08-28 LAB — CBC WITH DIFFERENTIAL/PLATELET
Basophils Absolute: 0 10*3/uL (ref 0.0–0.2)
Basos: 0 %
EOS (ABSOLUTE): 0.1 10*3/uL (ref 0.0–0.4)
EOS: 1 %
HEMATOCRIT: 37.1 % (ref 34.0–46.6)
HEMOGLOBIN: 12.7 g/dL (ref 11.1–15.9)
IMMATURE GRANULOCYTES: 0 %
Immature Grans (Abs): 0 10*3/uL (ref 0.0–0.1)
LYMPHS ABS: 2 10*3/uL (ref 0.7–3.1)
LYMPHS: 29 %
MCH: 32.5 pg (ref 26.6–33.0)
MCHC: 34.2 g/dL (ref 31.5–35.7)
MCV: 95 fL (ref 79–97)
MONOCYTES: 5 %
Monocytes Absolute: 0.3 10*3/uL (ref 0.1–0.9)
NEUTROS PCT: 65 %
Neutrophils Absolute: 4.5 10*3/uL (ref 1.4–7.0)
Platelets: 304 10*3/uL (ref 150–450)
RBC: 3.91 x10E6/uL (ref 3.77–5.28)
RDW: 12.1 % — ABNORMAL LOW (ref 12.3–15.4)
WBC: 6.9 10*3/uL (ref 3.4–10.8)

## 2018-08-28 LAB — COMPREHENSIVE METABOLIC PANEL
ALBUMIN: 3.9 g/dL (ref 3.5–5.5)
ALK PHOS: 81 IU/L (ref 39–117)
ALT: 22 IU/L (ref 0–32)
AST: 16 IU/L (ref 0–40)
Albumin/Globulin Ratio: 1.6 (ref 1.2–2.2)
BILIRUBIN TOTAL: 0.3 mg/dL (ref 0.0–1.2)
BUN / CREAT RATIO: 14 (ref 9–23)
BUN: 11 mg/dL (ref 6–24)
CHLORIDE: 103 mmol/L (ref 96–106)
CO2: 24 mmol/L (ref 20–29)
CREATININE: 0.79 mg/dL (ref 0.57–1.00)
Calcium: 9.2 mg/dL (ref 8.7–10.2)
GFR calc Af Amer: 104 mL/min/{1.73_m2} (ref >59)
GFR calc non Af Amer: 90 mL/min/{1.73_m2} (ref >59)
GLUCOSE: 108 mg/dL — AB (ref 65–99)
Globulin, Total: 2.4 g/dL (ref 1.5–4.5)
Potassium: 4.4 mmol/L (ref 3.5–5.2)
Sodium: 142 mmol/L (ref 134–144)
Total Protein: 6.3 g/dL (ref 6.0–8.5)

## 2018-08-28 LAB — VALPROIC ACID LEVEL: VALPROIC ACID LVL: 55 ug/mL (ref 50–100)

## 2018-09-02 ENCOUNTER — Telehealth: Payer: Self-pay | Admitting: *Deleted

## 2018-09-02 NOTE — Telephone Encounter (Signed)
LVM informing patient that her lab results are unremarkable. Left number for any questions.

## 2018-09-04 DIAGNOSIS — G4762 Sleep related leg cramps: Secondary | ICD-10-CM | POA: Diagnosis not present

## 2018-09-04 DIAGNOSIS — G5620 Lesion of ulnar nerve, unspecified upper limb: Secondary | ICD-10-CM | POA: Diagnosis not present

## 2018-09-04 DIAGNOSIS — I10 Essential (primary) hypertension: Secondary | ICD-10-CM | POA: Diagnosis not present

## 2018-09-04 DIAGNOSIS — R55 Syncope and collapse: Secondary | ICD-10-CM | POA: Diagnosis not present

## 2018-09-07 DIAGNOSIS — Z23 Encounter for immunization: Secondary | ICD-10-CM | POA: Diagnosis not present

## 2018-09-07 DIAGNOSIS — M1990 Unspecified osteoarthritis, unspecified site: Secondary | ICD-10-CM | POA: Diagnosis not present

## 2018-09-07 DIAGNOSIS — Z79899 Other long term (current) drug therapy: Secondary | ICD-10-CM | POA: Diagnosis not present

## 2018-09-07 DIAGNOSIS — M2550 Pain in unspecified joint: Secondary | ICD-10-CM | POA: Diagnosis not present

## 2018-10-05 DIAGNOSIS — R55 Syncope and collapse: Secondary | ICD-10-CM | POA: Diagnosis not present

## 2018-10-05 DIAGNOSIS — I10 Essential (primary) hypertension: Secondary | ICD-10-CM | POA: Diagnosis not present

## 2018-10-05 DIAGNOSIS — G4762 Sleep related leg cramps: Secondary | ICD-10-CM | POA: Diagnosis not present

## 2018-10-05 DIAGNOSIS — G5620 Lesion of ulnar nerve, unspecified upper limb: Secondary | ICD-10-CM | POA: Diagnosis not present

## 2018-10-07 ENCOUNTER — Other Ambulatory Visit: Payer: Self-pay | Admitting: Gastroenterology

## 2018-10-13 DIAGNOSIS — M549 Dorsalgia, unspecified: Secondary | ICD-10-CM | POA: Diagnosis not present

## 2018-10-19 ENCOUNTER — Other Ambulatory Visit: Payer: Self-pay | Admitting: Gastroenterology

## 2018-10-21 ENCOUNTER — Telehealth: Payer: Self-pay | Admitting: Gastroenterology

## 2018-10-21 NOTE — Telephone Encounter (Signed)
Would you like to refill thi, if so how many?

## 2018-11-02 DIAGNOSIS — G5620 Lesion of ulnar nerve, unspecified upper limb: Secondary | ICD-10-CM | POA: Diagnosis not present

## 2018-11-02 DIAGNOSIS — M159 Polyosteoarthritis, unspecified: Secondary | ICD-10-CM | POA: Diagnosis not present

## 2018-11-02 DIAGNOSIS — R55 Syncope and collapse: Secondary | ICD-10-CM | POA: Diagnosis not present

## 2018-11-02 DIAGNOSIS — G4762 Sleep related leg cramps: Secondary | ICD-10-CM | POA: Diagnosis not present

## 2018-11-03 DIAGNOSIS — B351 Tinea unguium: Secondary | ICD-10-CM | POA: Insufficient documentation

## 2018-11-03 DIAGNOSIS — M722 Plantar fascial fibromatosis: Secondary | ICD-10-CM | POA: Diagnosis not present

## 2018-11-03 HISTORY — DX: Tinea unguium: B35.1

## 2018-11-16 DIAGNOSIS — F419 Anxiety disorder, unspecified: Secondary | ICD-10-CM | POA: Diagnosis not present

## 2018-11-16 DIAGNOSIS — J028 Acute pharyngitis due to other specified organisms: Secondary | ICD-10-CM | POA: Diagnosis not present

## 2018-11-16 DIAGNOSIS — M159 Polyosteoarthritis, unspecified: Secondary | ICD-10-CM | POA: Diagnosis not present

## 2018-11-16 DIAGNOSIS — G4700 Insomnia, unspecified: Secondary | ICD-10-CM | POA: Diagnosis not present

## 2018-11-26 DIAGNOSIS — M6701 Short Achilles tendon (acquired), right ankle: Secondary | ICD-10-CM | POA: Diagnosis not present

## 2018-11-26 DIAGNOSIS — M6240 Contracture of muscle, unspecified site: Secondary | ICD-10-CM

## 2018-11-26 DIAGNOSIS — L501 Idiopathic urticaria: Secondary | ICD-10-CM | POA: Diagnosis not present

## 2018-11-26 DIAGNOSIS — M6702 Short Achilles tendon (acquired), left ankle: Secondary | ICD-10-CM | POA: Diagnosis not present

## 2018-11-26 DIAGNOSIS — M722 Plantar fascial fibromatosis: Secondary | ICD-10-CM | POA: Diagnosis not present

## 2018-11-26 DIAGNOSIS — B351 Tinea unguium: Secondary | ICD-10-CM | POA: Diagnosis not present

## 2018-11-26 HISTORY — DX: Contracture of muscle, unspecified site: M62.40

## 2018-11-27 DIAGNOSIS — R55 Syncope and collapse: Secondary | ICD-10-CM | POA: Diagnosis not present

## 2018-11-27 DIAGNOSIS — M797 Fibromyalgia: Secondary | ICD-10-CM | POA: Diagnosis not present

## 2018-11-27 DIAGNOSIS — J342 Deviated nasal septum: Secondary | ICD-10-CM | POA: Diagnosis not present

## 2018-11-27 DIAGNOSIS — J028 Acute pharyngitis due to other specified organisms: Secondary | ICD-10-CM | POA: Diagnosis not present

## 2018-11-27 DIAGNOSIS — G5620 Lesion of ulnar nerve, unspecified upper limb: Secondary | ICD-10-CM | POA: Diagnosis not present

## 2018-11-27 DIAGNOSIS — B349 Viral infection, unspecified: Secondary | ICD-10-CM | POA: Diagnosis not present

## 2018-11-27 DIAGNOSIS — M159 Polyosteoarthritis, unspecified: Secondary | ICD-10-CM | POA: Diagnosis not present

## 2018-11-27 DIAGNOSIS — B9789 Other viral agents as the cause of diseases classified elsewhere: Secondary | ICD-10-CM | POA: Diagnosis not present

## 2018-12-03 DIAGNOSIS — Z1231 Encounter for screening mammogram for malignant neoplasm of breast: Secondary | ICD-10-CM | POA: Diagnosis not present

## 2018-12-08 DIAGNOSIS — H579 Unspecified disorder of eye and adnexa: Secondary | ICD-10-CM | POA: Diagnosis not present

## 2018-12-08 DIAGNOSIS — Z79899 Other long term (current) drug therapy: Secondary | ICD-10-CM | POA: Diagnosis not present

## 2018-12-08 DIAGNOSIS — M1990 Unspecified osteoarthritis, unspecified site: Secondary | ICD-10-CM | POA: Diagnosis not present

## 2018-12-08 DIAGNOSIS — M2550 Pain in unspecified joint: Secondary | ICD-10-CM | POA: Diagnosis not present

## 2018-12-11 DIAGNOSIS — G5620 Lesion of ulnar nerve, unspecified upper limb: Secondary | ICD-10-CM | POA: Diagnosis not present

## 2018-12-11 DIAGNOSIS — M797 Fibromyalgia: Secondary | ICD-10-CM | POA: Diagnosis not present

## 2018-12-11 DIAGNOSIS — F419 Anxiety disorder, unspecified: Secondary | ICD-10-CM | POA: Diagnosis not present

## 2018-12-11 DIAGNOSIS — R55 Syncope and collapse: Secondary | ICD-10-CM | POA: Diagnosis not present

## 2018-12-11 DIAGNOSIS — R531 Weakness: Secondary | ICD-10-CM | POA: Diagnosis not present

## 2018-12-11 DIAGNOSIS — L500 Allergic urticaria: Secondary | ICD-10-CM | POA: Diagnosis not present

## 2018-12-17 DIAGNOSIS — H11132 Conjunctival pigmentations, left eye: Secondary | ICD-10-CM | POA: Diagnosis not present

## 2018-12-19 DIAGNOSIS — K219 Gastro-esophageal reflux disease without esophagitis: Secondary | ICD-10-CM | POA: Diagnosis not present

## 2018-12-19 DIAGNOSIS — G5620 Lesion of ulnar nerve, unspecified upper limb: Secondary | ICD-10-CM | POA: Diagnosis not present

## 2018-12-19 DIAGNOSIS — R55 Syncope and collapse: Secondary | ICD-10-CM | POA: Diagnosis not present

## 2018-12-19 DIAGNOSIS — M797 Fibromyalgia: Secondary | ICD-10-CM | POA: Diagnosis not present

## 2018-12-28 DIAGNOSIS — I10 Essential (primary) hypertension: Secondary | ICD-10-CM | POA: Diagnosis not present

## 2018-12-28 DIAGNOSIS — R112 Nausea with vomiting, unspecified: Secondary | ICD-10-CM | POA: Diagnosis not present

## 2018-12-28 DIAGNOSIS — R1013 Epigastric pain: Secondary | ICD-10-CM | POA: Diagnosis not present

## 2018-12-28 DIAGNOSIS — Z79899 Other long term (current) drug therapy: Secondary | ICD-10-CM | POA: Diagnosis not present

## 2018-12-28 DIAGNOSIS — W19XXXA Unspecified fall, initial encounter: Secondary | ICD-10-CM | POA: Diagnosis not present

## 2018-12-28 DIAGNOSIS — R531 Weakness: Secondary | ICD-10-CM | POA: Diagnosis not present

## 2018-12-28 DIAGNOSIS — R5381 Other malaise: Secondary | ICD-10-CM | POA: Diagnosis not present

## 2019-01-07 DIAGNOSIS — L501 Idiopathic urticaria: Secondary | ICD-10-CM | POA: Diagnosis not present

## 2019-01-07 DIAGNOSIS — L299 Pruritus, unspecified: Secondary | ICD-10-CM | POA: Diagnosis not present

## 2019-01-12 DIAGNOSIS — G5620 Lesion of ulnar nerve, unspecified upper limb: Secondary | ICD-10-CM | POA: Diagnosis not present

## 2019-01-12 DIAGNOSIS — E039 Hypothyroidism, unspecified: Secondary | ICD-10-CM | POA: Diagnosis not present

## 2019-01-12 DIAGNOSIS — G4762 Sleep related leg cramps: Secondary | ICD-10-CM | POA: Diagnosis not present

## 2019-01-12 DIAGNOSIS — R55 Syncope and collapse: Secondary | ICD-10-CM | POA: Diagnosis not present

## 2019-01-12 DIAGNOSIS — M797 Fibromyalgia: Secondary | ICD-10-CM | POA: Diagnosis not present

## 2019-01-14 DIAGNOSIS — M722 Plantar fascial fibromatosis: Secondary | ICD-10-CM | POA: Diagnosis not present

## 2019-01-14 DIAGNOSIS — B351 Tinea unguium: Secondary | ICD-10-CM | POA: Diagnosis not present

## 2019-01-18 DIAGNOSIS — R6889 Other general symptoms and signs: Secondary | ICD-10-CM | POA: Diagnosis not present

## 2019-01-18 DIAGNOSIS — R55 Syncope and collapse: Secondary | ICD-10-CM | POA: Diagnosis not present

## 2019-01-18 DIAGNOSIS — G5620 Lesion of ulnar nerve, unspecified upper limb: Secondary | ICD-10-CM | POA: Diagnosis not present

## 2019-01-18 DIAGNOSIS — F329 Major depressive disorder, single episode, unspecified: Secondary | ICD-10-CM | POA: Diagnosis not present

## 2019-01-20 ENCOUNTER — Ambulatory Visit: Payer: Medicare Other | Admitting: Gastroenterology

## 2019-01-29 ENCOUNTER — Ambulatory Visit: Payer: Medicare Other | Admitting: Gastroenterology

## 2019-01-31 ENCOUNTER — Other Ambulatory Visit: Payer: Self-pay | Admitting: Gastroenterology

## 2019-01-31 DIAGNOSIS — R1012 Left upper quadrant pain: Secondary | ICD-10-CM

## 2019-02-02 DIAGNOSIS — J0100 Acute maxillary sinusitis, unspecified: Secondary | ICD-10-CM | POA: Diagnosis not present

## 2019-02-02 DIAGNOSIS — M797 Fibromyalgia: Secondary | ICD-10-CM | POA: Diagnosis not present

## 2019-02-02 DIAGNOSIS — R55 Syncope and collapse: Secondary | ICD-10-CM | POA: Diagnosis not present

## 2019-02-02 DIAGNOSIS — G5620 Lesion of ulnar nerve, unspecified upper limb: Secondary | ICD-10-CM | POA: Diagnosis not present

## 2019-02-09 DIAGNOSIS — M797 Fibromyalgia: Secondary | ICD-10-CM | POA: Diagnosis not present

## 2019-02-09 DIAGNOSIS — G5620 Lesion of ulnar nerve, unspecified upper limb: Secondary | ICD-10-CM | POA: Diagnosis not present

## 2019-02-09 DIAGNOSIS — J399 Disease of upper respiratory tract, unspecified: Secondary | ICD-10-CM | POA: Diagnosis not present

## 2019-02-09 DIAGNOSIS — R55 Syncope and collapse: Secondary | ICD-10-CM | POA: Diagnosis not present

## 2019-02-22 ENCOUNTER — Encounter

## 2019-02-22 ENCOUNTER — Encounter: Payer: Self-pay | Admitting: Gastroenterology

## 2019-02-22 ENCOUNTER — Other Ambulatory Visit: Payer: Self-pay

## 2019-02-22 ENCOUNTER — Telehealth (INDEPENDENT_AMBULATORY_CARE_PROVIDER_SITE_OTHER): Payer: Medicare Other | Admitting: Gastroenterology

## 2019-02-22 VITALS — Ht 66.5 in | Wt 215.0 lb

## 2019-02-22 DIAGNOSIS — R1012 Left upper quadrant pain: Secondary | ICD-10-CM

## 2019-02-22 DIAGNOSIS — K581 Irritable bowel syndrome with constipation: Secondary | ICD-10-CM

## 2019-02-22 DIAGNOSIS — R1013 Epigastric pain: Secondary | ICD-10-CM

## 2019-02-22 MED ORDER — DICYCLOMINE HCL 10 MG PO CAPS: 10.0000 mg | ORAL_CAPSULE | Freq: Two times a day (BID) | ORAL | 0 refills | Status: DC

## 2019-02-22 NOTE — Patient Instructions (Signed)
Plan: - Continue docusate sodium 1 tab po qd - Continue omeprazole 40 mg p.o. once a day. - Bentyl 10mg  po bid. - Pt has FU appt with Dr Earnest Conroy (rhematology)

## 2019-02-22 NOTE — Progress Notes (Signed)
Chief Complaint: Abdominal pain  Referring Provider:  Cher Nakai, MD      ASSESSMENT AND PLAN;   #1. LUQ and epigastric pain- neg EGD 08/2017 with neg eso bx for EoE. S/p lap chole in the past. Neg solid-phase GES 08/2016, neg CT abdo/pel 05/2018.  Likely functional dyspepsia.  #2.  IBS with predominant constipation, negative colonoscopy 07/04/2017 except for small posterior anal fissure.  Plan: - Continue docusate sodium 1 tab po qd - Continue omeprazole 40 mg p.o. once a day. - Bentyl 10mg  po bid. - Pt has FU appt with Dr Earnest Conroy (rhematology) - FU televisit in 12 weeks.   HPI:    Kelly Haas is a 48 y.o. female  For follow-up visit  Still with epigastric pain but better with Bentyl. Has associated nausea but no vomiting. Has been tolerating stool softeners well and having bowel movements once per day. Denies having any significant abdominal bloating. No sodas, chocolates or mints. No history of alcohol use.  Dad passed away recently.  She is also being followed up by Dr. Earnest Conroy at [facility] for questionable mixed connective tissue disorder.  Has been on prednisone intermittently Past Medical History:  Diagnosis Date   Depression with anxiety    Fibromyalgia    G6PD deficiency    Hypertension    Kidney stone    Migraine    Seizure ([place])    Seizure disorder ([place]) 11/13/2017   Thyroid disease     Past Surgical History:  Procedure Laterality Date   ABDOMINAL HYSTERECTOMY     APPENDECTOMY     CHOLECYSTECTOMY     ESOPHAGOGASTRODUODENOSCOPY  08/29/2017   Mild gastritis. Stauts post empiric esophageal dilitation.    TOE SURGERY Left    removed toe nail   TONSILLECTOMY AND ADENOIDECTOMY      Family History  Problem Relation Age of Onset   Hypertension Mother    Kidney cancer Mother    Heart attack Father        73   Seizures Father    Stroke Maternal Grandmother    Pancreatic cancer Maternal Grandfather    Prostate cancer Maternal  Grandfather    Lung cancer Maternal Grandfather     Social History   Tobacco Use   Smoking status: Never Smoker   Smokeless tobacco: Never Used  Substance Use Topics   Alcohol use: No    Frequency: Never   Drug use: No    Current Outpatient Medications  Medication Sig Dispense Refill   amLODipine (NORVASC) 5 MG tablet Take by mouth.     benzonatate (TESSALON) 100 MG capsule Take by mouth 3 (three) times daily as needed for cough.     busPIRone (BUSPAR) 10 MG tablet Take 10 mg by mouth 2 (two) times daily.     clonazePAM (KLONOPIN) 0.5 MG tablet Take 0.5 mg by mouth 2 (two) times daily as needed for anxiety.     cyclobenzaprine (FLEXERIL) 10 MG tablet Take 1 tablet (10 mg total) by mouth 3 (three) times daily as needed for Muscle spasms.  3   dicyclomine (BENTYL) 10 MG capsule TAKE ONE CAPSULE BY MOUTH FOUR TIMES DAILY AS NEEDED (30 minutes before a meal OR at bedtime) 120 capsule 1   divalproex (DEPAKOTE) 500 MG DR tablet Take 500 mg by mouth 2 (two) times daily.     escitalopram (LEXAPRO) 20 MG tablet Take by mouth.     folic acid (FOLVITE) 1 MG tablet Take 1 mg by mouth  daily.  3   GOODSENSE STIMULANT LAXATIVE 8.6-50 MG tablet Take 1 tablet by mouth 2 (two) times daily. 60 tablet 3   levothyroxine (SYNTHROID, LEVOTHROID) 50 MCG tablet Take by mouth every other day.     levothyroxine (SYNTHROID, LEVOTHROID) 75 MCG tablet Take by mouth every other day.     LYRICA 50 MG capsule Take 1 capsule by mouth 2 (two) times daily.   5   omeprazole (PRILOSEC) 40 MG capsule Take 40 mg by mouth daily.  5   predniSONE (DELTASONE) 5 MG tablet Take 5 mg by mouth as needed.     ranitidine (ZANTAC) 150 MG tablet Take 300 mg by mouth at bedtime.  4   zolpidem (AMBIEN) 10 MG tablet Take 10 mg by mouth at bedtime as needed. for sleep  0   EPINEPHrine 0.3 mg/0.3 mL IJ SOAJ injection Inject into the muscle.     No current facility-administered medications for this visit.      Allergies  Allergen Reactions   Bee Venom Anaphylaxis   Ibuprofen Hives, Nausea And Vomiting and Rash    Other reaction(s): Vomiting (intolerance)   Acetaminophen    Aminolevulinate Derivatives    Aspirin-Acetaminophen-Caffeine    Dapsone     Other reaction(s): Other (See Comments)   Dimercaprol     Other reaction(s): Other (See Comments)   Methylcellulose    Nitrofuran Derivatives    Phenazopyridine    Primaquine    Toluidine Blue    Latex Rash   Oxycodone Rash   Phenergan [Promethazine Hcl] Rash    Review of Systems:  Reviewed     Physical Exam:    Ht 5' 6.5" (1.689 m)    Wt 215 lb (97.5 kg)    BMI 34.18 kg/m    Data Reviewed: I have personally reviewed following labs and imaging studies  CBC: CBC Latest Ref Rng & Units 08/27/2018 06/02/2018 11/24/2017  WBC 3.4 - 10.8 x10E3/uL 6.9 7.1 6.5  Hemoglobin 11.1 - 15.9 g/dL 12.7 14.6 13.3  Hematocrit 34.0 - 46.6 % 37.1 42.4 40.3  Platelets 150 - 450 x10E3/uL 304 329.0 354    CMP: CMP Latest Ref Rng & Units 08/27/2018 06/02/2018 11/24/2017  Glucose 65 - 99 mg/dL 108(H) 97 87  BUN 6 - 24 mg/dL 11 9 7   Creatinine 0.57 - 1.00 mg/dL 0.79 1.02 0.82  Sodium 134 - 144 mmol/L 142 140 145(H)  Potassium 3.5 - 5.2 mmol/L 4.4 4.7 4.5  Chloride 96 - 106 mmol/L 103 103 107(H)  CO2 20 - 29 mmol/L 24 31 22   Calcium 8.7 - 10.2 mg/dL 9.2 9.6 9.6  Total Protein 6.0 - 8.5 g/dL 6.3 7.1 7.0  Total Bilirubin 0.0 - 1.2 mg/dL 0.3 0.4 0.3  Alkaline Phos 39 - 117 IU/L 81 84 81  AST 0 - 40 IU/L 16 12 15   ALT 0 - 32 IU/L 22 14 14    This service was provided via telemedicine.  The patient was located at home.  The provider was located in office.  The patient did consent to this telephone visit and is aware of possible charges through their insurance for this visit.  Time spent on call and coordination of care: 25 min     Carmell Austria, MD 02/22/2019, 4:05 PM  Cc: Cher Nakai, MD

## 2019-03-01 DIAGNOSIS — R768 Other specified abnormal immunological findings in serum: Secondary | ICD-10-CM | POA: Diagnosis not present

## 2019-03-01 DIAGNOSIS — D75A Glucose-6-phosphate dehydrogenase (G6PD) deficiency without anemia: Secondary | ICD-10-CM | POA: Diagnosis not present

## 2019-03-01 DIAGNOSIS — G5620 Lesion of ulnar nerve, unspecified upper limb: Secondary | ICD-10-CM | POA: Diagnosis not present

## 2019-03-01 DIAGNOSIS — M797 Fibromyalgia: Secondary | ICD-10-CM | POA: Diagnosis not present

## 2019-03-01 DIAGNOSIS — R51 Headache: Secondary | ICD-10-CM | POA: Diagnosis not present

## 2019-03-01 DIAGNOSIS — R55 Syncope and collapse: Secondary | ICD-10-CM | POA: Diagnosis not present

## 2019-03-01 DIAGNOSIS — M2550 Pain in unspecified joint: Secondary | ICD-10-CM | POA: Diagnosis not present

## 2019-03-05 DIAGNOSIS — G5620 Lesion of ulnar nerve, unspecified upper limb: Secondary | ICD-10-CM | POA: Diagnosis not present

## 2019-03-05 DIAGNOSIS — M797 Fibromyalgia: Secondary | ICD-10-CM | POA: Diagnosis not present

## 2019-03-05 DIAGNOSIS — R079 Chest pain, unspecified: Secondary | ICD-10-CM | POA: Diagnosis not present

## 2019-03-05 DIAGNOSIS — R55 Syncope and collapse: Secondary | ICD-10-CM | POA: Diagnosis not present

## 2019-03-08 ENCOUNTER — Telehealth: Payer: Self-pay | Admitting: Cardiology

## 2019-03-08 NOTE — Telephone Encounter (Signed)
Virtual Visit Pre-Appointment Phone Call  Steps For Call:  1. Confirm consent - "In the setting of the current Covid19 crisis, you are scheduled for a (phone or video) visit with your provider on (date) at (time).  Just as we do with many in-office visits, in order for you to participate in this visit, we must obtain consent.  If you'd like, I can send this to your mychart (if signed up) or email for you to review.  Otherwise, I can obtain your verbal consent now.  All virtual visits are billed to your insurance company just like a normal visit would be.  By agreeing to a virtual visit, we'd like you to understand that the technology does not allow for your provider to perform an examination, and thus may limit your provider's ability to fully assess your condition. If your provider identifies any concerns that need to be evaluated in person, we will make arrangements to do so.  Finally, though the technology is pretty good, we cannot assure that it will always work on either your or our end, and in the setting of a video visit, we may have to convert it to a phone-only visit.  In either situation, we cannot ensure that we have a secure connection.  Are you willing to proceed?" STAFF: Did the patient verbally acknowledge consent to telehealth visit? Document YES/NO here: YES  2. Confirm the BEST phone number to call the day of the visit by including in appointment notes  3. Give patient instructions for WebEx/MyChart download to smartphone as below or Doximity/Doxy.me if video visit (depending on what platform provider is using)  4. Advise patient to be prepared with their blood pressure, heart rate, weight, any heart rhythm information, their current medicines, and a piece of paper and pen handy for any instructions they may receive the day of their visit  5. Inform patient they will receive a phone call 15 minutes prior to their appointment time (may be from unknown caller ID) so they should be  prepared to answer  6. Confirm that appointment type is correct in Epic appointment notes (VIDEO vs PHONE)     TELEPHONE CALL NOTE  DELSY Haas has been deemed a candidate for a follow-up tele-health visit to limit community exposure during the Covid-19 pandemic. I spoke with the patient via phone to ensure availability of phone/video source, confirm preferred email & phone number, and discuss instructions and expectations.  I reminded FEMALE IAFRATE to be prepared with any vital sign and/or heart rhythm information that could potentially be obtained via home monitoring, at the time of her visit. I reminded SRINIDHI LANDERS to expect a phone call at the time of her visit if her visit.  Frederic Jericho 03/08/2019 8:52 AM   INSTRUCTIONS FOR DOWNLOADING THE WEBEX APP TO SMARTPHONE  - If Apple, ask patient to go to App Store and type in WebEx in the search bar. [employer], the blue/green circle. If Android, go to [employer] and type in [employer] in the search bar. The app is free but as with any other app downloads, their phone may require them to verify saved payment information or Apple/Android password.  - The patient does NOT have to create an account. - On the day of the visit, the assist will walk the patient through joining the meeting with the meeting number/password.  INSTRUCTIONS FOR DOWNLOADING THE MYCHART APP TO SMARTPHONE  - The patient must first make  sure to have activated MyChart and know their login information - If Apple, go to [employer] and type in MyChart in the search bar and download the app. If Android, ask patient to go to [employer] and type in [employer] in the search bar and download the app. The app is free but as with any other app downloads, their phone may require them to verify saved payment information or Apple/Android password.  - The patient will need to then log into the app with their MyChart username and password, and select [facility] as their healthcare provider to link the account. When it is time for your visit, go to the MyChart app, find appointments, and click Begin Video Visit. Be sure to Select Allow for your device to access the Microphone and Camera for your visit. You will then be connected, and your provider will be with you shortly.  **If they have any issues connecting, or need assistance please contact MyChart service desk [phone])**  **If using a computer, in order to ensure the best quality for their visit they will need to use either of the following Internet Browsers: [employer], or Google Chrome**  IF USING DOXIMITY or DOXY.ME - The patient will receive a link just prior to their visit, either by text or email (to be determined day of appointment depending on if it's doxy.me or Doximity).     FULL LENGTH CONSENT FOR TELE-HEALTH VISIT   I hereby voluntarily request, consent and authorize [facility] and its employed or contracted physicians, physician assistants, nurse practitioners or other licensed health care professionals (the Practitioner), to provide me with telemedicine health care services (the Services") as deemed necessary by the treating Practitioner. I acknowledge and consent to receive the Services by the Practitioner via telemedicine. I understand that the telemedicine visit will involve communicating with the Practitioner through live audiovisual communication technology and the disclosure of certain medical information by electronic transmission. I acknowledge that I have been given the opportunity to request an in-person assessment or other available alternative prior to the telemedicine visit and am voluntarily participating in the telemedicine visit.  I understand that I have the right to withhold or withdraw my consent to the use of telemedicine in the course of my care at any time, without affecting my right to future care or treatment, and that the  Practitioner or I may terminate the telemedicine visit at any time. I understand that I have the right to inspect all information obtained and/or recorded in the course of the telemedicine visit and may receive copies of available information for a reasonable fee.  I understand that some of the potential risks of receiving the Services via telemedicine include:   Delay or interruption in medical evaluation due to technological equipment failure or disruption;  Information transmitted may not be sufficient (e.g. poor resolution of images) to allow for appropriate medical decision making by the Practitioner; and/or   In rare instances, security protocols could fail, causing a breach of personal health information.  Furthermore, I acknowledge that it is my responsibility to provide information about my medical history, conditions and care that is complete and accurate to the best of my ability. I acknowledge that Practitioner's advice, recommendations, and/or decision may be based on factors not within their control, such as incomplete or inaccurate data provided by me or distortions of diagnostic images or specimens that may result from electronic transmissions. I understand that the practice of medicine is not an exact  science and that Practitioner makes no warranties or guarantees regarding treatment outcomes. I acknowledge that I will receive a copy of this consent concurrently upon execution via email to the email address I last provided but may also request a printed copy by calling the office of [facility].    I understand that my insurance will be billed for this visit.   I have read or had this consent read to me.  I understand the contents of this consent, which adequately explains the benefits and risks of the Services being provided via telemedicine.   I have been provided ample opportunity to ask questions regarding this consent and the Services and have had my questions answered to my  satisfaction.  I give my informed consent for the services to be provided through the use of telemedicine in my medical care  By participating in this telemedicine visit I agree to the above.

## 2019-03-09 ENCOUNTER — Telehealth (INDEPENDENT_AMBULATORY_CARE_PROVIDER_SITE_OTHER): Payer: Medicare Other | Admitting: Cardiology

## 2019-03-09 ENCOUNTER — Telehealth: Payer: Self-pay | Admitting: Cardiology

## 2019-03-09 ENCOUNTER — Other Ambulatory Visit: Payer: Self-pay

## 2019-03-09 ENCOUNTER — Telehealth: Payer: Self-pay | Admitting: Gastroenterology

## 2019-03-09 ENCOUNTER — Encounter: Payer: Self-pay | Admitting: Cardiology

## 2019-03-09 DIAGNOSIS — M797 Fibromyalgia: Secondary | ICD-10-CM | POA: Diagnosis not present

## 2019-03-09 DIAGNOSIS — R0602 Shortness of breath: Secondary | ICD-10-CM | POA: Diagnosis not present

## 2019-03-09 DIAGNOSIS — R079 Chest pain, unspecified: Secondary | ICD-10-CM | POA: Diagnosis not present

## 2019-03-09 DIAGNOSIS — R0789 Other chest pain: Secondary | ICD-10-CM

## 2019-03-09 DIAGNOSIS — R1012 Left upper quadrant pain: Secondary | ICD-10-CM

## 2019-03-09 DIAGNOSIS — R55 Syncope and collapse: Secondary | ICD-10-CM | POA: Diagnosis not present

## 2019-03-09 DIAGNOSIS — G5620 Lesion of ulnar nerve, unspecified upper limb: Secondary | ICD-10-CM | POA: Diagnosis not present

## 2019-03-09 DIAGNOSIS — R1084 Generalized abdominal pain: Secondary | ICD-10-CM

## 2019-03-09 MED ORDER — NITROGLYCERIN 0.4 MG SL SUBL: 0.4000 mg | SUBLINGUAL_TABLET | SUBLINGUAL | 3 refills | Status: DC | PRN

## 2019-03-09 NOTE — Telephone Encounter (Signed)
Left message for patient to call back  

## 2019-03-09 NOTE — Telephone Encounter (Signed)
Patient forgot to mention to you at the phone visit G6PD deficiency?  She is continuing to experience abdominal pain.  Same symptoms that she discussed with you last week.  She had a visit with PCP and he asked her to pose the question to you >  Can her symptoms be related to G6PD?

## 2019-03-09 NOTE — Telephone Encounter (Signed)
Can you please add to the diagnosis.

## 2019-03-09 NOTE — Addendum Note (Signed)
Addended by: Beckey Rutter on: 03/09/2019 10:43 AM   Modules accepted: Orders

## 2019-03-09 NOTE — Telephone Encounter (Signed)
Patient states she forgot to tell RRR during her telehealth visit this morning that she has G6 PD and wants it documented in her chart

## 2019-03-09 NOTE — Patient Instructions (Addendum)
Medication Instructions:  YOU WILL START TAKING nitroglycerin as needed for chest pain.When having chest pain, stop what you are doing and sit down. Take 1 nitro, wait 5 minutes. Still having chest pain, take 1 nitro, wait 5 minutes. Still having chest pain, take 1 nitro, dial 911. Total of 3 nitro in 15 minutes.  If you need a refill on your cardiac medications before your next appointment, please call your pharmacy.   Lab work: NONE If you have labs (blood work) drawn today and your tests are completely normal, you will receive your results only by: Marland Kitchen MyChart Message (if you have MyChart) OR . A paper copy in the mail If you have any lab test that is abnormal or we need to change your treatment, we will call you to review the results.  Testing/Procedures: NONE  Follow-Up: At [facility], you and your health needs are our priority.  As part of our continuing mission to provide you with exceptional heart care, we have created designated Provider Care Teams.  These Care Teams include your primary Cardiologist (physician) and Advanced Practice Providers (APPs -  Physician Assistants and Nurse Practitioners) who all work together to provide you with the care you need, when you need it. You will need a follow up appointment in 7 days.    IMPORTANT information Nitroglycerin sublingual tablets What is this medicine? NITROGLYCERIN (nye troe GLI ser in) is a type of vasodilator. It relaxes blood vessels, increasing the blood and oxygen supply to your heart. This medicine is used to relieve chest pain caused by angina. It is also used to prevent chest pain before activities like climbing stairs, going outdoors in cold weather, or sexual activity. This medicine may be used for other purposes; ask your health care provider or pharmacist if you have questions. COMMON BRAND NAME(S): Nitroquick, Nitrostat, Nitrotab What should I tell my health care provider before I take this medicine? They need to  know if you have any of these conditions: -anemia -head injury, recent stroke, or bleeding in the brain -liver disease -previous heart attack -an unusual or allergic reaction to nitroglycerin, other medicines, foods, dyes, or preservatives -pregnant or trying to get pregnant -breast-feeding How should I use this medicine? Take this medicine by mouth as needed. At the first sign of an angina attack (chest pain or tightness) place one tablet under your tongue. You can also take this medicine 5 to 10 minutes before an event likely to produce chest pain. Follow the directions on the prescription label. Let the tablet dissolve under the tongue. Do not swallow whole. Replace the dose if you accidentally swallow it. It will help if your mouth is not dry. Saliva around the tablet will help it to dissolve more quickly. Do not eat or drink, smoke or chew tobacco while a tablet is dissolving. If you are not better within 5 minutes after taking ONE dose of nitroglycerin, call 9-1-1 immediately to seek emergency medical care. Do not take more than 3 nitroglycerin tablets over 15 minutes. If you take this medicine often to relieve symptoms of angina, your doctor or health care professional may provide you with different instructions to manage your symptoms. If symptoms do not go away after following these instructions, it is important to call 9-1-1 immediately. Do not take more than 3 nitroglycerin tablets over 15 minutes. Talk to your pediatrician regarding the use of this medicine in children. Special care may be needed. Overdosage: If you think you have taken too much of  this medicine contact a poison control center or emergency room at once. NOTE: This medicine is only for you. Do not share this medicine with others. What if I miss a dose? This does not apply. This medicine is only used as needed. What may interact with this medicine? Do not take this medicine with any of the following medications: -certain  migraine medicines like ergotamine and dihydroergotamine (DHE) -medicines used to treat erectile dysfunction like sildenafil, tadalafil, and vardenafil -riociguat This medicine may also interact with the following medications: -alteplase -aspirin -heparin -medicines for high blood pressure -medicines for mental depression -other medicines used to treat angina -phenothiazines like chlorpromazine, mesoridazine, prochlorperazine, thioridazine This list may not describe all possible interactions. Give your health care provider a list of all the medicines, herbs, non-prescription drugs, or dietary supplements you use. Also tell them if you smoke, drink alcohol, or use illegal drugs. Some items may interact with your medicine. What should I watch for while using this medicine? Tell your doctor or health care professional if you feel your medicine is no longer working. Keep this medicine with you at all times. Sit or lie down when you take your medicine to prevent falling if you feel dizzy or faint after using it. Try to remain calm. This will help you to feel better faster. If you feel dizzy, take several deep breaths and lie down with your feet propped up, or bend forward with your head resting between your knees. You may get drowsy or dizzy. Do not drive, use machinery, or do anything that needs mental alertness until you know how this drug affects you. Do not stand or sit up quickly, especially if you are an older patient. This reduces the risk of dizzy or fainting spells. Alcohol can make you more drowsy and dizzy. Avoid alcoholic drinks. Do not treat yourself for coughs, colds, or pain while you are taking this medicine without asking your doctor or health care professional for advice. Some ingredients may increase your blood pressure. What side effects may I notice from receiving this medicine? Side effects that you should report to your doctor or health care professional as soon as  possible: -blurred vision -dry mouth -skin rash -sweating -the feeling of extreme pressure in the head -unusually weak or tired Side effects that usually do not require medical attention (report to your doctor or health care professional if they continue or are bothersome): -flushing of the face or neck -headache -irregular heartbeat, palpitations -nausea, vomiting This list may not describe all possible side effects. Call your doctor for medical advice about side effects. You may report side effects to FDA at 1-800-FDA-1088. Where should I keep my medicine? Keep out of the reach of children. Store at room temperature between 20 and 25 degrees C (68 and 77 degrees F). Store in [employer]. Protect from light and moisture. Keep tightly closed. Throw away any unused medicine after the expiration date. NOTE: This sheet is a summary. It may not cover all possible information. If you have questions about this medicine, talk to your doctor, pharmacist, or health care provider.  2019 Elsevier/Gold Standard (2013-09-02 17:57:36)

## 2019-03-09 NOTE — Telephone Encounter (Signed)
Pls call pt, she thinks that she is having a flare up.

## 2019-03-09 NOTE — Telephone Encounter (Signed)
G6PD deficiency by itself will not cause abdominal pain. If she accidentally or otherwise takes medications which are contraindicated, she can have hemolytic anemia and can have abdominal pain. I thought she was doing better on Bentyl and had appointment with Dr. Earnest Conroy (rheumatology at [facility]) for consideration of mixed connective tissue disorder/fibromyalgia.   Sheri -if she already has seen Dr Earnest Conroy, can we get records.   If Adalaide still has abdominal pain and rheumatology work-up is negative, proceed with repeat CTA  Abdo/pelvis to rule out any vascular etiology.

## 2019-03-09 NOTE — Telephone Encounter (Signed)
Patient called back to add information to her chart, please call.

## 2019-03-09 NOTE — Progress Notes (Signed)
Virtual Visit via Video Note   This visit type was conducted due to national recommendations for restrictions regarding the COVID-19 Pandemic (e.g. social distancing) in an effort to limit this patient's exposure and mitigate transmission in our community.  Due to her co-morbid illnesses, this patient is at least at moderate risk for complications without adequate follow up.  This format is felt to be most appropriate for this patient at this time.  All issues noted in this document were discussed and addressed.  A limited physical exam was performed with this format.  Please refer to the patient's chart for her consent to telehealth for [facility].   Evaluation Performed:  Follow-up visit  Date:  03/09/2019   ID:  Kelly Haas, DOB 1971-07-29, MRN [id number]  Patient Location: Home Provider Location: Home  PCP:  Cher Nakai, MD  Cardiologist:  No primary care provider on file.  Electrophysiologist:  None   Chief Complaint: Shortness of breath on exertion and chest discomfort  History of Present Illness:    Kelly Haas is a 48 y.o. female with past medical history of recurrent syncope.  Patient mentions to me that she went to her primary care physician and gave history of chest discomfort and therefore was referred here.  She has a issue with shortness of breath on exertion.  She mentions to me that whenever she has activities of daily living she gets up and gets out of breath.  At the time of my evaluation, the patient is alert awake oriented and in no distress.  On my evaluation she appears to be comfortable by video conferencing.  She complains of chest discomfort which is not related to exertion.  It is pretty much occurring all the time.  She tells me EKG done by primary care physician was unremarkable.  I have all the records from the office visit I do not have an EKG and will request for it.  The patient does not have symptoms concerning for COVID-19 infection (fever, chills,  cough, or new shortness of breath).    Past Medical History:  Diagnosis Date  . Depression with anxiety   . Fibromyalgia   . G6PD deficiency   . Hypertension   . Kidney stone   . Migraine   . Seizure ([place])   . Seizure disorder ([place]) 11/13/2017  . Thyroid disease    Past Surgical History:  Procedure Laterality Date  . ABDOMINAL HYSTERECTOMY    . APPENDECTOMY    . CHOLECYSTECTOMY    . ESOPHAGOGASTRODUODENOSCOPY  08/29/2017   Mild gastritis. Stauts post empiric esophageal dilitation.   . TOE SURGERY Left    removed toe nail  . TONSILLECTOMY AND ADENOIDECTOMY       Current Meds  Medication Sig  . amLODipine (NORVASC) 5 MG tablet Take 5 mg by mouth daily.   Marland Kitchen aspirin EC 81 MG tablet Take 81 mg by mouth daily.  . benzonatate (TESSALON) 100 MG capsule Take by mouth 3 (three) times daily as needed for cough.  . busPIRone (BUSPAR) 10 MG tablet Take 10 mg by mouth 2 (two) times daily.  . clonazePAM (KLONOPIN) 0.5 MG tablet Take 0.5 mg by mouth 2 (two) times daily as needed for anxiety.  . cyclobenzaprine (FLEXERIL) 10 MG tablet Take 1 tablet (10 mg total) by mouth 3 (three) times daily as needed for Muscle spasms.  Marland Kitchen dicyclomine (BENTYL) 10 MG capsule Take 1 capsule (10 mg total) by mouth 2 (two) times daily.  . divalproex (  DEPAKOTE) 500 MG DR tablet Take 500 mg by mouth 2 (two) times daily.  Marland Kitchen EPINEPHrine 0.3 mg/0.3 mL IJ SOAJ injection Inject 0.3 mg into the muscle as needed.   Marland Kitchen escitalopram (LEXAPRO) 20 MG tablet Take 20 mg by mouth daily.   . folic acid (FOLVITE) 1 MG tablet Take 1 mg by mouth daily.  Marland Kitchen GOODSENSE STIMULANT LAXATIVE 8.6-50 MG tablet Take 1 tablet by mouth 2 (two) times daily. (Patient taking differently: 1 tablet at bedtime as needed. )  . levothyroxine (SYNTHROID, LEVOTHROID) 50 MCG tablet Take by mouth every other day.  . levothyroxine (SYNTHROID, LEVOTHROID) 75 MCG tablet Take by mouth every other day.  Marland Kitchen LYRICA 50 MG capsule Take 1 capsule by mouth 2 (two)  times daily.   Marland Kitchen omeprazole (PRILOSEC) 40 MG capsule Take 40 mg by mouth daily.  . ondansetron (ZOFRAN) 4 MG tablet Take 4 mg by mouth every 6 (six) hours as needed.  . predniSONE (DELTASONE) 5 MG tablet Take 5 mg by mouth as needed.  . ranitidine (ZANTAC) 150 MG tablet Take 300 mg by mouth at bedtime.  Marland Kitchen zolpidem (AMBIEN) 10 MG tablet Take 10 mg by mouth at bedtime as needed. for sleep     Allergies:   Bee venom; Ibuprofen; Acetaminophen; Aminolevulinate derivatives; Aspirin-acetaminophen-caffeine; Dapsone; Dimercaprol; Methylcellulose; Nitrofuran derivatives; Phenazopyridine; Primaquine; Toluidine blue; Latex; Oxycodone; and Phenergan [promethazine hcl]   Social History   Tobacco Use  . Smoking status: Never Smoker  . Smokeless tobacco: Never Used  Substance Use Topics  . Alcohol use: No    Frequency: Never  . Drug use: No     Family Hx: The patient's family history includes Heart attack in her father; Hypertension in her mother; Kidney cancer in her mother; Lung cancer in her maternal grandfather; Pancreatic cancer in her maternal grandfather; Prostate cancer in her maternal grandfather; Seizures in her father; Stroke in her maternal grandmother.  ROS:   Please see the history of present illness.    As mentioned above All other systems reviewed and are negative.   Prior CV studies:   The following studies were reviewed today:  Records from primary care physician were noted and reviewed in detail  Labs/Other Tests and Data Reviewed:    EKG:  I am awaiting the EKG done at primary care doctor's office and will review it today.  Recent Labs: 08/27/2018: ALT 22; BUN 11; Creatinine, Ser 0.79; Hemoglobin 12.7; Platelets 304; Potassium 4.4; Sodium 142   Recent Lipid Panel No results found for: CHOL, TRIG, HDL, CHOLHDL, LDLCALC, LDLDIRECT  Wt Readings from Last 3 Encounters:  03/09/19 211 lb (95.7 kg)  02/22/19 215 lb (97.5 kg)  08/27/18 213 lb 6.4 oz (96.8 kg)      Objective:    Vital Signs:  BP 131/84 (BP Location: Left Arm, Patient Position: Sitting, Cuff Size: Normal)   Pulse 72   Ht 5' 6.5" (1.689 m)   Wt 211 lb (95.7 kg)   BMI 33.55 kg/m    VITAL SIGNS:  reviewed  ASSESSMENT & PLAN:    1. Chest discomfort: Appears atypical for coronary etiology.  Sublingual nitroglycerin prescription was sent, its protocol and 911 protocol explained and the patient vocalized understanding questions were answered to the patient's satisfaction.  Patient was advised to go to the nearest emergency room for any significant concerns. 2. History of recurrent syncope: This is been so for the past several years and she is managed by primary care physician and neurologist for the  same. 3. In view of her shortness of breath mentioned to her the following.  She is seeing her primary care physician today I told her to get blood work including d-dimer to see if there are any issues with pulmonary thromboembolism as a cause of her shortness of breath. 4. Her chest discomfort is not typical for coronary etiology however in view of risk factors we will do a stress test when appropriate in view of the current hold on all those testing procedures because of the viral pandemic. 5. She is taking a coated baby aspirin on a regular basis.  I will see her in follow-up appointment in a week by video conference.  I explained to her that I will get a copy of EKG from primary care physician and reviewed personally.  COVID-19 Education: The signs and symptoms of COVID-19 were discussed with the patient and how to seek care for testing (follow up with PCP or arrange E-visit).  The importance of social distancing was discussed today.  Time:   Today, I have spent 45 minutes with the patient with telehealth technology discussing the above problems.     Medication Adjustments/Labs and Tests Ordered: Current medicines are reviewed at length with the patient today.  Concerns regarding medicines  are outlined above.   Tests Ordered: No orders of the defined types were placed in this encounter.   Medication Changes: No orders of the defined types were placed in this encounter.   Disposition:  Follow up in 1 week(s)  Signed, Jenean Lindau, MD  03/09/2019 10:00 AM    [facility]

## 2019-03-09 NOTE — Telephone Encounter (Signed)
Please see the notes from Dr. Gerilyn Nestle.  They are in her chart.  Let me know if you want to proceed with the CTA after you review notes.

## 2019-03-10 NOTE — Telephone Encounter (Signed)
Notes reviewed Proceed with CTA  (if possible this week or early next week) Then, lets do tele-visit next week Thx  Kelly Haas

## 2019-03-10 NOTE — Telephone Encounter (Signed)
G6 deficiency documented under patient problem list

## 2019-03-10 NOTE — Telephone Encounter (Signed)
Patient has been scheduled for Friday at 11:00 at [place].  She needs to be NPO for 6 hours prior. Patient notified of the appt and the instructions for the appt.  Follow up arranged for 03/16/19 11:00 ZOOM visit.

## 2019-03-12 ENCOUNTER — Encounter (HOSPITAL_BASED_OUTPATIENT_CLINIC_OR_DEPARTMENT_OTHER): Payer: Self-pay

## 2019-03-12 ENCOUNTER — Other Ambulatory Visit: Payer: Self-pay

## 2019-03-12 ENCOUNTER — Ambulatory Visit (HOSPITAL_BASED_OUTPATIENT_CLINIC_OR_DEPARTMENT_OTHER)
Admission: RE | Admit: 2019-03-12 | Discharge: 2019-03-12 | Disposition: A | Discharge status: Home / Self Care | Payer: Medicare Other | Source: Ambulatory Visit | Attending: Gastroenterology | Admitting: Gastroenterology

## 2019-03-12 DIAGNOSIS — R1012 Left upper quadrant pain: Secondary | ICD-10-CM | POA: Diagnosis not present

## 2019-03-12 DIAGNOSIS — R1084 Generalized abdominal pain: Secondary | ICD-10-CM | POA: Diagnosis not present

## 2019-03-12 DIAGNOSIS — R109 Unspecified abdominal pain: Secondary | ICD-10-CM | POA: Diagnosis not present

## 2019-03-12 IMAGING — CT CT ANGIOGRAPHY ABDOMEN
2 of 8 series · 12 of 46 positions shown, 18 images · IV contrast (omnipaque)
Comparison: CT of the abdomen and pelvis on [DATE]

CLINICAL DATA: Chronic midline abdominal pain which is worse with
eating. History of prior cholecystectomy.

EXAM:
CT ANGIOGRAPHY ABDOMEN
TECHNIQUE: Multidetector CT imaging of the abdomen was performed using the
standard protocol during bolus administration of intravenous
contrast. Multiplanar reconstructed images and MIPs were obtained
and reviewed to evaluate the vascular anatomy.
CONTRAST:  100mL OMNIPAQUE IOHEXOL 350 MG/ML SOLN

[Series 6: axial venous · axial · portal-venous · 0.74mm/px · z∈[-405,-80]mm · 10 of 81 slices shown, 16 images]
[im 8/81  soft-tissue]
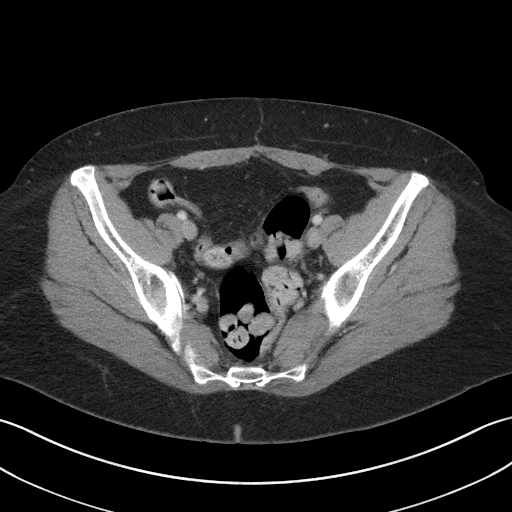
[im 8/81  bone]
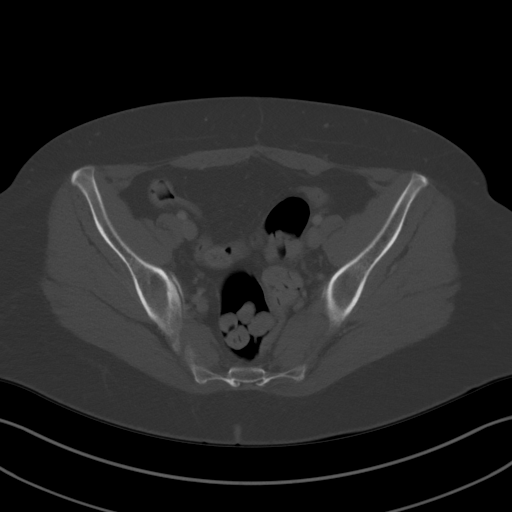
[im 15/81  soft-tissue]
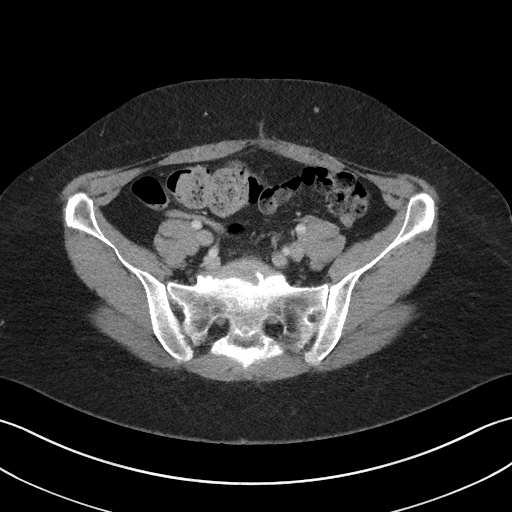
[im 22/81  soft-tissue]
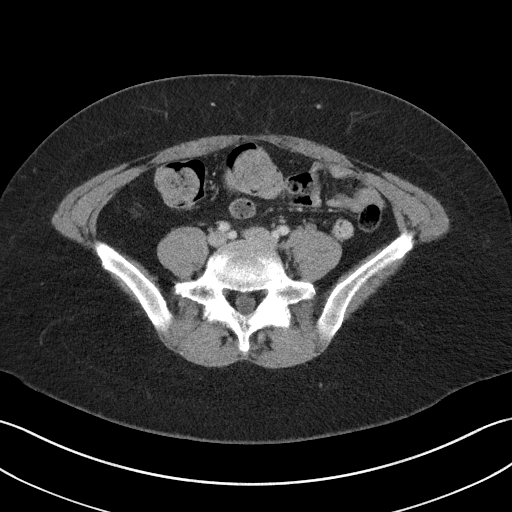
[im 30/81  soft-tissue]
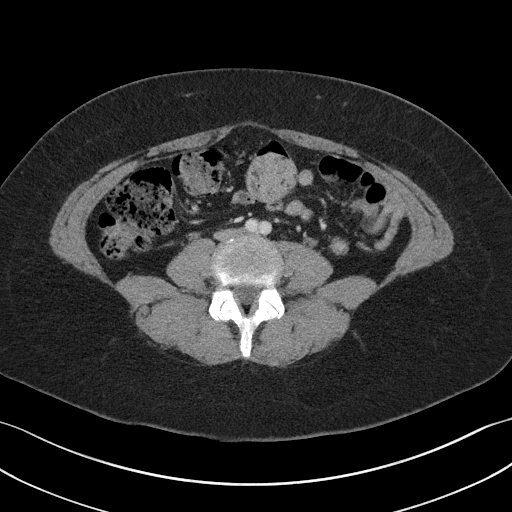
[im 37/81  soft-tissue]
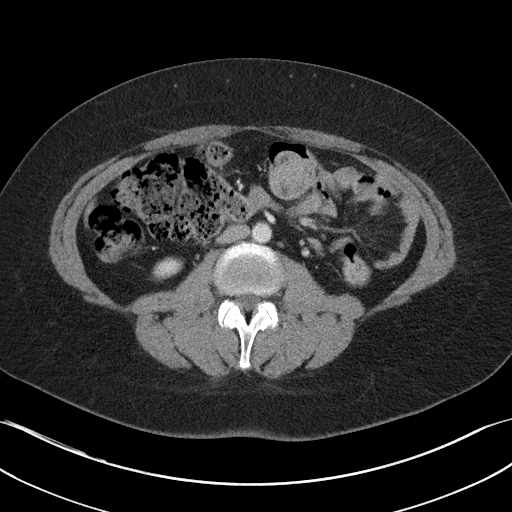
[im 44/81  soft-tissue]
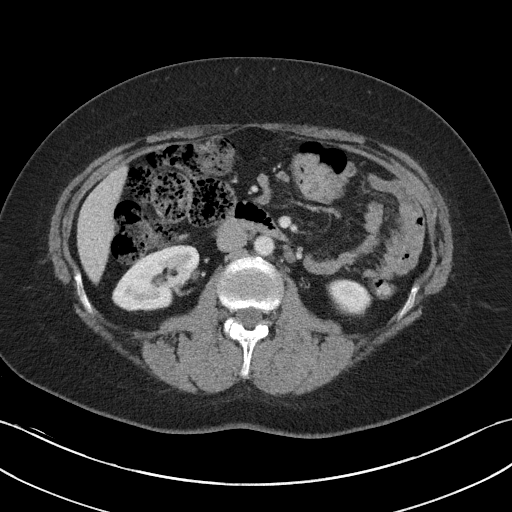
[im 51/81  soft-tissue]
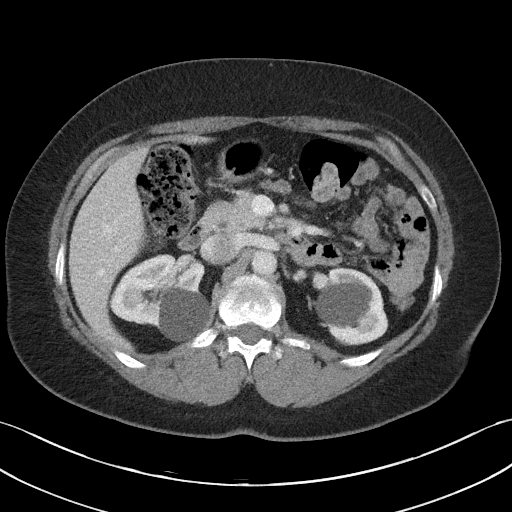
[im 51/81  lung]
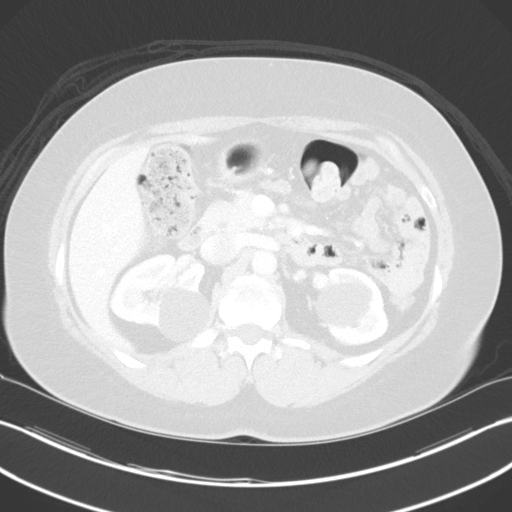
[im 59/81  soft-tissue]
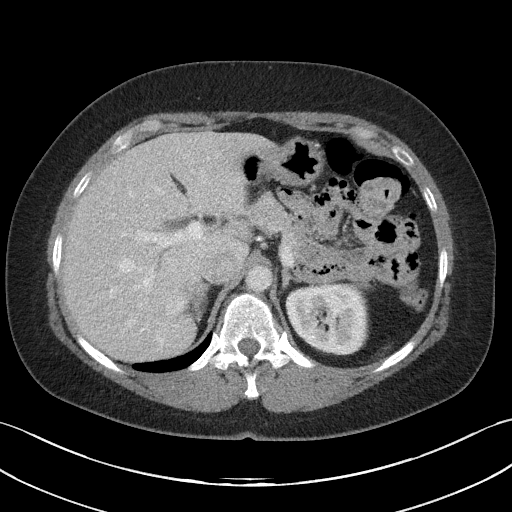
[im 59/81  lung]
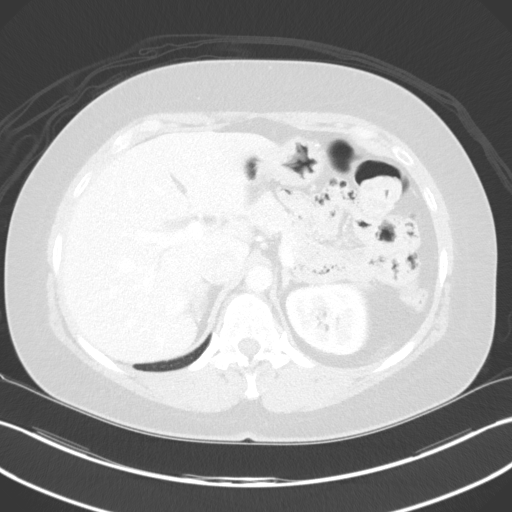
[im 66/81  soft-tissue]
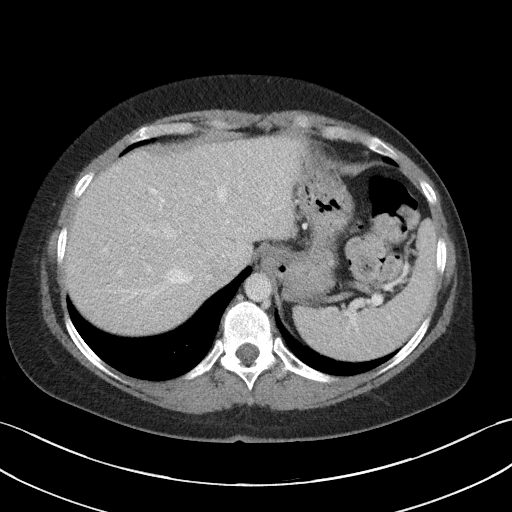
[im 66/81  lung]
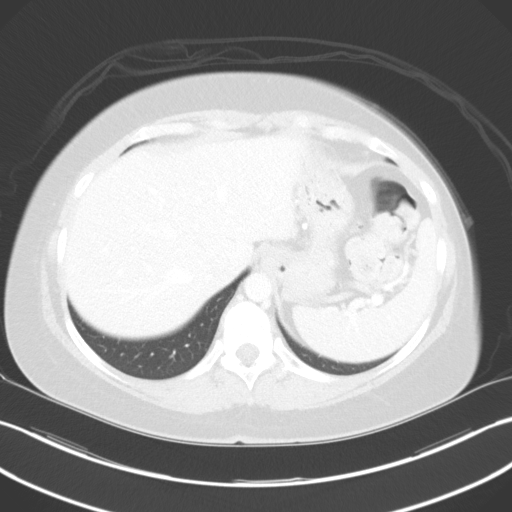
[im 66/81  bone]
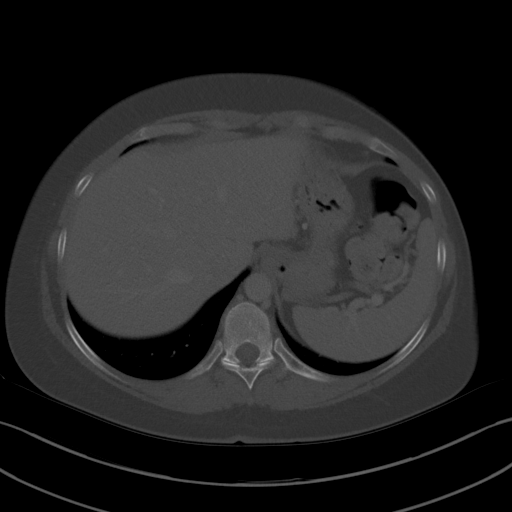
[im 73/81  soft-tissue]
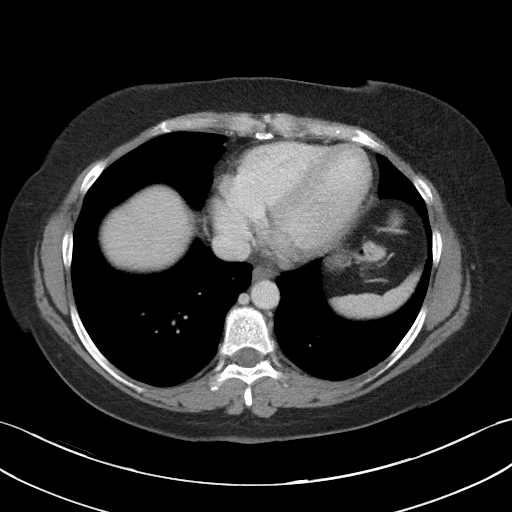
[im 73/81  lung]
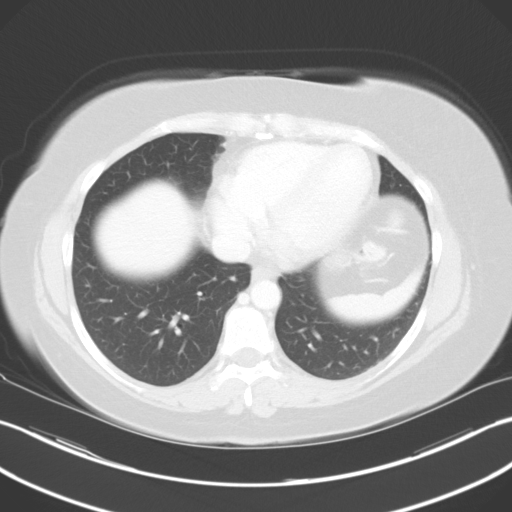

[Series 12: coronals · coronal · 0.63mm/px · 2 of 112 slices shown]
[im 38/112  soft-tissue]
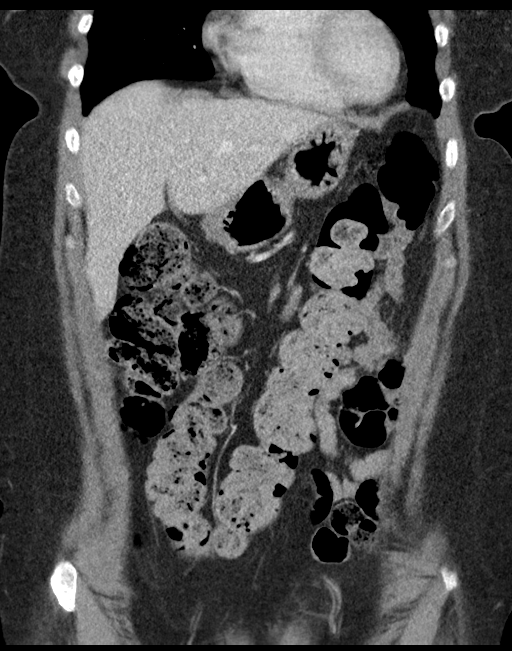
[im 75/112  soft-tissue]
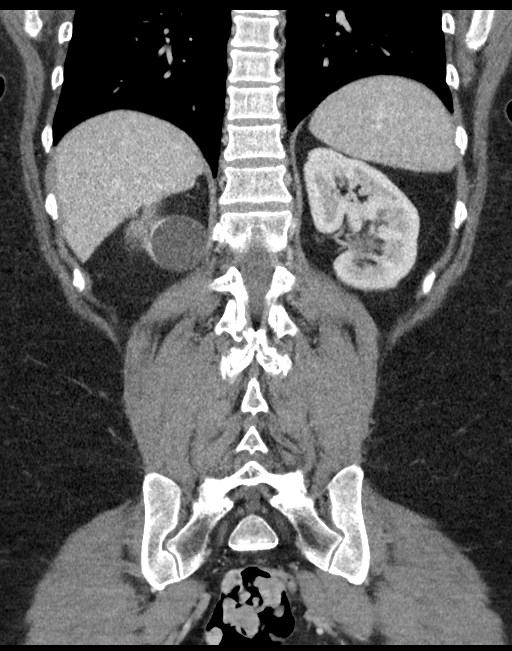

[12 of 46 positions shown; findings below may reference images not displayed]

FINDINGS: VASCULAR

Aorta: The abdominal aorta is normally patent and demonstrates
normal caliber without evidence atherosclerosis, aneurysm or
stenosis. No evidence of vasculitis or dissection.

Celiac: Normally patent.  Normal distal branch vessel anatomy.

SMA: Normally patent. Normally patent visualized distal branch
vessels.

Renals: Bilateral single renal arteries demonstrate normal patency.

IMA: Normally patent.

Inflow: Visualized bilateral iliac arteries demonstrate normal
patency.

Veins: Venous phase imaging demonstrates normal patency mesenteric
veins, the splenic vein and the portal vein. The IVC, bilateral
renal veins and visualized iliac veins demonstrate normal patency.

Review of the MIP images confirms the above findings.

NON-VASCULAR

Lower chest: No acute abnormality.

Hepatobiliary: No focal liver abnormality is seen. Status post
cholecystectomy. No biliary dilatation.

Pancreas: Unremarkable. No pancreatic ductal dilatation or
surrounding inflammatory changes.

Spleen: Normal in size without focal abnormality.

Adrenals/Urinary Tract: Stable bilateral benign renal cysts. No
evidence of hydronephrosis or solid renal masses. The adrenal glands
are normal.

Stomach/Bowel: Visualized bowel shows no evidence of obstruction,
ileus, inflammation, pneumatosis or focal lesion. No free air
identified.

Lymphatic: No enlarged lymph nodes identified.

Other: No abdominal wall hernia or abnormality. No focal fluid
collection or ascites.

Musculoskeletal: No acute or significant osseous findings.
IMPRESSION: VASCULAR

Normal CTA of the abdomen demonstrating normal patency of mesenteric
vasculature and abdominal aorta without evidence mesenteric arterial
occlusive disease. No mesenteric venous thrombus identified.

NON-VASCULAR

No acute findings in the abdomen.

## 2019-03-12 MED ORDER — IOHEXOL 350 MG/ML SOLN
100.0000 mL | Freq: Once | INTRAVENOUS | Status: AC | PRN
  Administered 2019-03-12: 11:00:00 100 mL via INTRAVENOUS

## 2019-03-16 ENCOUNTER — Encounter: Payer: Self-pay | Admitting: Gastroenterology

## 2019-03-16 ENCOUNTER — Encounter: Payer: Self-pay | Admitting: Cardiology

## 2019-03-16 ENCOUNTER — Telehealth (INDEPENDENT_AMBULATORY_CARE_PROVIDER_SITE_OTHER): Payer: Medicare Other | Admitting: Gastroenterology

## 2019-03-16 ENCOUNTER — Telehealth (INDEPENDENT_AMBULATORY_CARE_PROVIDER_SITE_OTHER): Payer: Medicare Other | Admitting: Cardiology

## 2019-03-16 ENCOUNTER — Other Ambulatory Visit: Payer: Self-pay

## 2019-03-16 VITALS — BP 119/81 | HR 90 | Ht 66.5 in | Wt 217.0 lb

## 2019-03-16 DIAGNOSIS — L501 Idiopathic urticaria: Secondary | ICD-10-CM | POA: Diagnosis not present

## 2019-03-16 DIAGNOSIS — R55 Syncope and collapse: Secondary | ICD-10-CM

## 2019-03-16 DIAGNOSIS — K581 Irritable bowel syndrome with constipation: Secondary | ICD-10-CM

## 2019-03-16 NOTE — Patient Instructions (Signed)
Continue Docusate Continue Omeprazole  Continue Bentyl   Follow up Televisit 12 weeks

## 2019-03-16 NOTE — Progress Notes (Signed)
Chief Complaint: Abdominal pain  Referring Provider:  Cher Nakai, MD      ASSESSMENT AND PLAN;   #1. LUQ and epigastric pain- neg EGD 08/2017 with neg eso bx for EoE. S/p lap chole in the past. Neg solid-phase GES 08/2016, neg CT abdo/pel 05/2018, neg CTA 03/12/2019.  Likely functional dyspepsia.  #2.  IBS with predom constipation, neg colon 07/04/2017 except for small posterior anal fissure.  Plan: - Continue docusate sodium 1 tab po qd - Continue omeprazole 40 mg p.o. once a day. - Continue Bentyl 10mg  po bid. - FU televisit in 12 weeks.   HPI:    Kelly Haas is a 48 y.o. female  For follow-up visit Negative CTA Abdo/pelvis 03/12/2027 was reviewed independently.  Feels better Not constipated any more with colace. Denies having any significant abdominal pain.  She does feel better.   Dad passed away recently.  She is also being followed up by Dr. Earnest Conroy at [facility] for ? mixed connective tissue disorder.  Has been on prednisone intermittently. Off MTx.   I have left my cell number with Dr. Jon Billings nurse today. Past Medical History:  Diagnosis Date  . Depression with anxiety   . Fibromyalgia   . G6PD deficiency   . Hypertension   . Kidney stone   . Migraine   . Seizure ([place])   . Seizure disorder ([place]) 11/13/2017  . Thyroid disease     Past Surgical History:  Procedure Laterality Date  . ABDOMINAL HYSTERECTOMY    . APPENDECTOMY    . CHOLECYSTECTOMY    . ESOPHAGOGASTRODUODENOSCOPY  08/29/2017   Mild gastritis. Stauts post empiric esophageal dilitation.   . TOE SURGERY Left    removed toe nail  . TONSILLECTOMY AND ADENOIDECTOMY      Family History  Problem Relation Age of Onset  . Hypertension Mother   . Kidney cancer Mother   . Heart attack Father        42  . Seizures Father   . Stroke Maternal Grandmother   . Pancreatic cancer Maternal Grandfather   . Prostate cancer Maternal Grandfather   . Lung cancer Maternal Grandfather     Social History    Tobacco Use  . Smoking status: Never Smoker  . Smokeless tobacco: Never Used  Substance Use Topics  . Alcohol use: No    Frequency: Never  . Drug use: No    Current Outpatient Medications  Medication Sig Dispense Refill  . amLODipine (NORVASC) 5 MG tablet Take 5 mg by mouth daily.     Marland Kitchen aspirin EC 81 MG tablet Take 81 mg by mouth daily.    . benzonatate (TESSALON) 100 MG capsule Take by mouth 3 (three) times daily as needed for cough.    . busPIRone (BUSPAR) 10 MG tablet Take 10 mg by mouth 2 (two) times daily.    . clonazePAM (KLONOPIN) 0.5 MG tablet Take 0.5 mg by mouth 2 (two) times daily as needed for anxiety.    . cyclobenzaprine (FLEXERIL) 10 MG tablet Take 1 tablet (10 mg total) by mouth 3 (three) times daily as needed for Muscle spasms.  3  . dicyclomine (BENTYL) 10 MG capsule Take 1 capsule (10 mg total) by mouth 2 (two) times daily. 90 capsule 0  . divalproex (DEPAKOTE) 500 MG DR tablet Take 500 mg by mouth 2 (two) times daily.    Marland Kitchen EPINEPHrine 0.3 mg/0.3 mL IJ SOAJ injection Inject 0.3 mg into the muscle as needed.     Marland Kitchen  escitalopram (LEXAPRO) 20 MG tablet Take 20 mg by mouth daily.     . folic acid (FOLVITE) 1 MG tablet Take 1 mg by mouth daily.  3  . GOODSENSE STIMULANT LAXATIVE 8.6-50 MG tablet Take 1 tablet by mouth 2 (two) times daily. (Patient taking differently: 1 tablet at bedtime as needed. ) 60 tablet 3  . levothyroxine (SYNTHROID, LEVOTHROID) 50 MCG tablet Take by mouth every other day.    . levothyroxine (SYNTHROID, LEVOTHROID) 75 MCG tablet Take by mouth every other day.    Marland Kitchen LYRICA 50 MG capsule Take 1 capsule by mouth 2 (two) times daily.   5  . nitroGLYCERIN (NITROSTAT) 0.4 MG SL tablet Place 1 tablet (0.4 mg total) under the tongue every 5 (five) minutes as needed for chest pain. 25 tablet 3  . omeprazole (PRILOSEC) 40 MG capsule Take 40 mg by mouth daily.  5  . ondansetron (ZOFRAN) 4 MG tablet Take 4 mg by mouth every 6 (six) hours as needed.    . predniSONE  (DELTASONE) 5 MG tablet Take 5 mg by mouth as needed.    . ranitidine (ZANTAC) 150 MG tablet Take 300 mg by mouth at bedtime.  4  . zolpidem (AMBIEN) 10 MG tablet Take 10 mg by mouth at bedtime as needed. for sleep  0   No current facility-administered medications for this visit.     Allergies  Allergen Reactions  . Bee Venom Anaphylaxis  . Ibuprofen Hives, Nausea And Vomiting and Rash    Other reaction(s): Vomiting (intolerance)  . Acetaminophen   . Aminolevulinate Derivatives   . Aspirin-Acetaminophen-Caffeine   . Dapsone     Other reaction(s): Other (See Comments)  . Dimercaprol     Other reaction(s): Other (See Comments)  . Methylcellulose   . Nitrofuran Derivatives   . Phenazopyridine   . Primaquine   . Toluidine Blue   . Latex Rash  . Oxycodone Rash  . Phenergan [Promethazine Hcl] Rash    Review of Systems:  Reviewed     Physical Exam:    There were no vitals taken for this visit.   Data Reviewed: I have personally reviewed following labs and imaging studies  CBC: CBC Latest Ref Rng & Units 08/27/2018 06/02/2018 11/24/2017  WBC 3.4 - 10.8 x10E3/uL 6.9 7.1 6.5  Hemoglobin 11.1 - 15.9 g/dL 12.7 14.6 13.3  Hematocrit 34.0 - 46.6 % 37.1 42.4 40.3  Platelets 150 - 450 x10E3/uL 304 329.0 354    CMP: CMP Latest Ref Rng & Units 08/27/2018 06/02/2018 11/24/2017  Glucose 65 - 99 mg/dL 108(H) 97 87  BUN 6 - 24 mg/dL 11 9 7   Creatinine 0.57 - 1.00 mg/dL 0.79 1.02 0.82  Sodium 134 - 144 mmol/L 142 140 145(H)  Potassium 3.5 - 5.2 mmol/L 4.4 4.7 4.5  Chloride 96 - 106 mmol/L 103 103 107(H)  CO2 20 - 29 mmol/L 24 31 22   Calcium 8.7 - 10.2 mg/dL 9.2 9.6 9.6  Total Protein 6.0 - 8.5 g/dL 6.3 7.1 7.0  Total Bilirubin 0.0 - 1.2 mg/dL 0.3 0.4 0.3  Alkaline Phos 39 - 117 IU/L 81 84 81  AST 0 - 40 IU/L 16 12 15   ALT 0 - 32 IU/L 22 14 14    This service was provided via telemedicine.  The patient was located at home.  The provider was located in office.  The patient did  consent to this telephone visit and is aware of possible charges through their insurance for this visit.  Time spent on call and coordination of care: 15 min     Carmell Austria, MD 03/16/2019, 10:59 AM  Cc: Cher Nakai, MD

## 2019-03-16 NOTE — Progress Notes (Signed)
Virtual Visit via Video Note   This visit type was conducted due to national recommendations for restrictions regarding the COVID-19 Pandemic (e.g. social distancing) in an effort to limit this patient's exposure and mitigate transmission in our community.  Due to her co-morbid illnesses, this patient is at least at moderate risk for complications without adequate follow up.  This format is felt to be most appropriate for this patient at this time.  All issues noted in this document were discussed and addressed.  A limited physical exam was performed with this format.  Please refer to the patient's chart for her consent to telehealth for [facility].   Evaluation Performed:  Follow-up visit  Date:  03/16/2019   ID:  Kelly Haas, DOB July 13, 1971, MRN [id number]  Patient Location: Home Provider Location: Home  PCP:  Cher Nakai, MD  Cardiologist:  No primary care provider on file.  Electrophysiologist:  None   Chief Complaint: Syncope evaluation and chest discomfort  History of Present Illness:    Kelly Haas is a 48 y.o. female with past medical history of longstanding multiple syncopal episode attacks with no clear-cut diagnosis.  Patient has been extensively evaluated by cardiology and neurology.  She was referred to me this time for chest discomfort.  Her chest discomfort appears atypical and I advised her to gradually of her walking on a regular basis and she is done it well.  At the time of my evaluation, the patient is alert awake oriented and in no distress.  The patient does not have symptoms concerning for COVID-19 infection (fever, chills, cough, or new shortness of breath).    Past Medical History:  Diagnosis Date  . Depression with anxiety   . Fibromyalgia   . G6PD deficiency   . Hypertension   . Kidney stone   . Migraine   . Seizure ([place])   . Seizure disorder ([place]) 11/13/2017  . Thyroid disease    Past Surgical History:  Procedure Laterality Date  .  ABDOMINAL HYSTERECTOMY    . APPENDECTOMY    . CHOLECYSTECTOMY    . ESOPHAGOGASTRODUODENOSCOPY  08/29/2017   Mild gastritis. Stauts post empiric esophageal dilitation.   . TOE SURGERY Left    removed toe nail  . TONSILLECTOMY AND ADENOIDECTOMY       Current Meds  Medication Sig  . amLODipine (NORVASC) 5 MG tablet Take 5 mg by mouth daily.   Marland Kitchen aspirin EC 81 MG tablet Take 81 mg by mouth daily.  . benzonatate (TESSALON) 100 MG capsule Take by mouth 3 (three) times daily as needed for cough.  . busPIRone (BUSPAR) 10 MG tablet Take 10 mg by mouth 2 (two) times daily.  . clonazePAM (KLONOPIN) 0.5 MG tablet Take 0.5 mg by mouth 2 (two) times daily as needed for anxiety.  . cyclobenzaprine (FLEXERIL) 10 MG tablet Take 1 tablet (10 mg total) by mouth 3 (three) times daily as needed for Muscle spasms.  Marland Kitchen dicyclomine (BENTYL) 10 MG capsule Take 1 capsule (10 mg total) by mouth 2 (two) times daily.  . divalproex (DEPAKOTE) 500 MG DR tablet Take 500 mg by mouth 2 (two) times daily.  Marland Kitchen EPINEPHrine 0.3 mg/0.3 mL IJ SOAJ injection Inject 0.3 mg into the muscle as needed.   Marland Kitchen escitalopram (LEXAPRO) 20 MG tablet Take 20 mg by mouth daily.   . folic acid (FOLVITE) 1 MG tablet Take 1 mg by mouth daily.  Marland Kitchen levothyroxine (SYNTHROID, LEVOTHROID) 50 MCG tablet Take by mouth every  other day.  . levothyroxine (SYNTHROID, LEVOTHROID) 75 MCG tablet Take by mouth every other day.  Marland Kitchen LYRICA 50 MG capsule Take 1 capsule by mouth 2 (two) times daily.   . nitroGLYCERIN (NITROSTAT) 0.4 MG SL tablet Place 1 tablet (0.4 mg total) under the tongue every 5 (five) minutes as needed for chest pain.  Marland Kitchen ondansetron (ZOFRAN) 4 MG tablet Take 4 mg by mouth every 6 (six) hours as needed.  . predniSONE (DELTASONE) 5 MG tablet Take 5 mg by mouth as needed.  . ranitidine (ZANTAC) 150 MG tablet Take 300 mg by mouth at bedtime.  Marland Kitchen zolpidem (AMBIEN) 10 MG tablet Take 10 mg by mouth at bedtime as needed. for sleep     Allergies:   Bee  venom; Ibuprofen; Acetaminophen; Aminolevulinate derivatives; Aspirin-acetaminophen-caffeine; Dapsone; Dimercaprol; Methylcellulose; Nitrofuran derivatives; Phenazopyridine; Primaquine; Toluidine blue; Latex; Oxycodone; and Phenergan [promethazine hcl]   Social History   Tobacco Use  . Smoking status: Never Smoker  . Smokeless tobacco: Never Used  Substance Use Topics  . Alcohol use: No    Frequency: Never  . Drug use: No     Family Hx: The patient's family history includes Heart attack in her father; Hypertension in her mother; Kidney cancer in her mother; Lung cancer in her maternal grandfather; Pancreatic cancer in her maternal grandfather; Prostate cancer in her maternal grandfather; Seizures in her father; Stroke in her maternal grandmother.  ROS:   Please see the history of present illness.    As mentioned above All other systems reviewed and are negative.   Prior CV studies:   The following studies were reviewed today:  None  Labs/Other Tests and Data Reviewed:    EKG:  No ECG reviewed.  Recent Labs: 08/27/2018: ALT 22; BUN 11; Creatinine, Ser 0.79; Hemoglobin 12.7; Platelets 304; Potassium 4.4; Sodium 142   Recent Lipid Panel No results found for: CHOL, TRIG, HDL, CHOLHDL, LDLCALC, LDLDIRECT  Wt Readings from Last 3 Encounters:  03/16/19 217 lb (98.4 kg)  03/09/19 211 lb (95.7 kg)  02/22/19 215 lb (97.5 kg)     Objective:    Vital Signs:  BP 119/81 (BP Location: Left Arm, Patient Position: Sitting, Cuff Size: Normal)   Pulse 90   Ht 5' 6.5" (1.689 m)   Wt 217 lb (98.4 kg)   BMI 34.50 kg/m    VITAL SIGNS:  reviewed  ASSESSMENT & PLAN:    1. Chest discomfort: Patient appears comfortable at this time.  She has followed my instructions about walking regularly and is happy about it.  She mentions to me that yesterday she walked 30 minutes without any problems. 2. Her blood pressure is stable 3. We will put her in for a stress test when the pandemic is  better.  I would technically think it is in the next 6 to 8-week.  Again she is walking regularly without any symptoms.  She knows to go to the nearest emergency room for any significant symptoms. 4. Patient will be seen in follow-up appointment in 2 months or earlier if the patient has any concerns   COVID-19 Education: The signs and symptoms of COVID-19 were discussed with the patient and how to seek care for testing (follow up with PCP or arrange E-visit).  The importance of social distancing was discussed today.  Time:   Today, I have spent 16 minutes with the patient with telehealth technology discussing the above problems.     Medication Adjustments/Labs and Tests Ordered: Current medicines are reviewed  at length with the patient today.  Concerns regarding medicines are outlined above.   Tests Ordered: No orders of the defined types were placed in this encounter.   Medication Changes: No orders of the defined types were placed in this encounter.   Disposition:  Follow up in 2 month(s)  Signed, Jenean Lindau, MD  03/16/2019 9:50 AM    [place]

## 2019-03-16 NOTE — Patient Instructions (Addendum)
Medication Instructions:  Your physician recommends that you continue on your current medications as directed. Please refer to the Current Medication list given to you today.  If you need a refill on your cardiac medications before your next appointment, please call your pharmacy.   Lab work: NONE If you have labs (blood work) drawn today and your tests are completely normal, you will receive your results only by: Marland Kitchen MyChart Message (if you have MyChart) OR . A paper copy in the mail If you have any lab test that is abnormal or we need to change your treatment, we will call you to review the results.  Testing/Procedures: Your physician has requested that you have a stress echocardiogram. For further information please visit [email or web address]. Please follow instruction sheet as given.   March 16, 2019      Kelly Haas DOB: [birth year] MRN: [id number] [address] [place] [address]   Dear Kelly Haas,   You will be called to schedule an Exercise stress test  Please arrive 15 minutes prior to your appointment time for registration and insurance purposes.  The test will take approximately 45 minutes to complete.  How to prepare for your Exercise Stress Test: . Do bring a list of your current medications with you.  If not listed below, you may take your medications as normal. . Do wear comfortable clothes (no dresses or overalls) and walking shoes, tennis shoes preferred (no heels or open toed shoes are allowed) . Do Not wear cologne, perfume, aftershave or lotions (deodorant is allowed). . Please report to [address], [place], [place]   If these instructions are not followed, your test will have to be rescheduled.  If you have questions or concerns about your appointment, you can call [phone] If you cannot keep your appointment, please provide 24 hours notification to the Stress Lab, to avoid a possible $50 charge to your account.    Follow-Up: At [facility], you and your health needs are our priority.  As part of our continuing mission to provide you with exceptional heart care, we have created designated Provider Care Teams.  These Care Teams include your primary Cardiologist (physician) and Advanced Practice Providers (APPs -  Physician Assistants and Nurse Practitioners) who all work together to provide you with the care you need, when you need it. You will need a follow up appointment in 2 months.     Any Other Special Instructions Will Be Listed Below  Exercise Stress Echocardiogram  An exercise stress echocardiogram is a test that checks how well your heart is working. For this test, you will walk on a treadmill to make your heart beat faster. This test uses sound waves (ultrasound) and a computer to make pictures (images) of your heart. These pictures will be taken before you exercise and after you exercise. What happens before the procedure?  Follow instructions from your doctor about what you cannot eat or drink before the test.  Do not drink or eat anything that has caffeine in it. Stop having caffeine for 24 hours before the test.  Ask your doctor about changing or stopping your normal medicines. This is important if you take diabetes medicines or blood thinners. Ask your doctor if you should take your medicines with water before the test.  If you use an inhaler, bring it to the test.  Do not use any products that have nicotine or tobacco in them, such as cigarettes and e-cigarettes. Stop using them for 4 hours  before the test. If you need help quitting, ask your doctor.  Wear comfortable shoes and clothing. What happens during the procedure?  You will be hooked up to a TV screen. Your doctor will watch the screen to see how fast your heart beats during the test.  Before you exercise, a computer will make a picture of your heart. To do this: ? A gel will be put on your chest. ? A wand will be moved over the gel. ? Sound  waves from the wand will go to the computer to make the picture.  Your will start walking on a treadmill. The treadmill will start at a slow speed. It will get faster a little bit at a time. When you walk faster, your heart will beat faster.  The treadmill will be stopped when your heart is working hard.  You will lie down right away so another picture of your heart can be taken.  The test will take 30-60 minutes. What happens after the procedure?  Your heart rate and blood pressure will be watched after the test.  If your doctor says that you can, you may: ? Eat what you usually eat. ? Do your normal activities. ? Take medicines like normal. Summary  An exercise stress echocardiogram is a test that checks how well your heart is working.  Follow instructions about what you cannot eat or drink before the test. Ask your doctor if you should take your normal medicines before the test.  Stop having caffeine for 24 hours before the test. Do not use anything with nicotine or tobacco in it for 4 hours before the test.  A computer will take a picture of your heart before you walk on a treadmill. It will take another picture when you are done walking.  Your heart rate and blood pressure will be watched after the test. This information is not intended to replace advice given to you by your health care provider. Make sure you discuss any questions you have with your health care provider. Document Released: 09/01/2009 Document Revised: 07/28/2016 Document Reviewed: 07/28/2016 Elsevier Interactive Patient Education  2019 [place] bring a list of your current medications with you.  If not listed below, you may take your medications as normal.

## 2019-03-16 NOTE — Addendum Note (Signed)
Addended by: Beckey Rutter on: 03/16/2019 10:16 AM   Modules accepted: Orders

## 2019-03-17 DIAGNOSIS — M797 Fibromyalgia: Secondary | ICD-10-CM | POA: Diagnosis not present

## 2019-03-17 DIAGNOSIS — G5620 Lesion of ulnar nerve, unspecified upper limb: Secondary | ICD-10-CM | POA: Diagnosis not present

## 2019-03-17 DIAGNOSIS — G4762 Sleep related leg cramps: Secondary | ICD-10-CM | POA: Diagnosis not present

## 2019-03-17 DIAGNOSIS — R55 Syncope and collapse: Secondary | ICD-10-CM | POA: Diagnosis not present

## 2019-03-29 DIAGNOSIS — L501 Idiopathic urticaria: Secondary | ICD-10-CM | POA: Diagnosis not present

## 2019-03-31 DIAGNOSIS — G5620 Lesion of ulnar nerve, unspecified upper limb: Secondary | ICD-10-CM | POA: Diagnosis not present

## 2019-03-31 DIAGNOSIS — R55 Syncope and collapse: Secondary | ICD-10-CM | POA: Diagnosis not present

## 2019-03-31 DIAGNOSIS — G4762 Sleep related leg cramps: Secondary | ICD-10-CM | POA: Diagnosis not present

## 2019-03-31 DIAGNOSIS — M797 Fibromyalgia: Secondary | ICD-10-CM | POA: Diagnosis not present

## 2019-04-05 DIAGNOSIS — E785 Hyperlipidemia, unspecified: Secondary | ICD-10-CM | POA: Diagnosis not present

## 2019-04-05 DIAGNOSIS — L501 Idiopathic urticaria: Secondary | ICD-10-CM | POA: Diagnosis not present

## 2019-04-05 DIAGNOSIS — R531 Weakness: Secondary | ICD-10-CM | POA: Diagnosis not present

## 2019-04-13 DIAGNOSIS — Z1331 Encounter for screening for depression: Secondary | ICD-10-CM | POA: Diagnosis not present

## 2019-04-13 DIAGNOSIS — E785 Hyperlipidemia, unspecified: Secondary | ICD-10-CM | POA: Diagnosis not present

## 2019-04-13 DIAGNOSIS — Z0000 Encounter for general adult medical examination without abnormal findings: Secondary | ICD-10-CM | POA: Diagnosis not present

## 2019-04-13 DIAGNOSIS — Z9181 History of falling: Secondary | ICD-10-CM | POA: Diagnosis not present

## 2019-04-14 DIAGNOSIS — G5620 Lesion of ulnar nerve, unspecified upper limb: Secondary | ICD-10-CM | POA: Diagnosis not present

## 2019-04-14 DIAGNOSIS — M797 Fibromyalgia: Secondary | ICD-10-CM | POA: Diagnosis not present

## 2019-04-14 DIAGNOSIS — G4762 Sleep related leg cramps: Secondary | ICD-10-CM | POA: Diagnosis not present

## 2019-04-14 DIAGNOSIS — R55 Syncope and collapse: Secondary | ICD-10-CM | POA: Diagnosis not present

## 2019-04-21 DIAGNOSIS — G5620 Lesion of ulnar nerve, unspecified upper limb: Secondary | ICD-10-CM | POA: Diagnosis not present

## 2019-04-21 DIAGNOSIS — M797 Fibromyalgia: Secondary | ICD-10-CM | POA: Diagnosis not present

## 2019-04-21 DIAGNOSIS — R110 Nausea: Secondary | ICD-10-CM | POA: Diagnosis not present

## 2019-04-21 DIAGNOSIS — R55 Syncope and collapse: Secondary | ICD-10-CM | POA: Diagnosis not present

## 2019-04-22 DIAGNOSIS — R1084 Generalized abdominal pain: Secondary | ICD-10-CM | POA: Diagnosis not present

## 2019-04-22 DIAGNOSIS — R112 Nausea with vomiting, unspecified: Secondary | ICD-10-CM | POA: Diagnosis not present

## 2019-04-22 DIAGNOSIS — R52 Pain, unspecified: Secondary | ICD-10-CM | POA: Diagnosis not present

## 2019-04-22 DIAGNOSIS — R1110 Vomiting, unspecified: Secondary | ICD-10-CM | POA: Diagnosis not present

## 2019-04-22 DIAGNOSIS — R109 Unspecified abdominal pain: Secondary | ICD-10-CM | POA: Diagnosis not present

## 2019-04-28 DIAGNOSIS — G5620 Lesion of ulnar nerve, unspecified upper limb: Secondary | ICD-10-CM | POA: Diagnosis not present

## 2019-04-28 DIAGNOSIS — R55 Syncope and collapse: Secondary | ICD-10-CM | POA: Diagnosis not present

## 2019-04-28 DIAGNOSIS — R110 Nausea: Secondary | ICD-10-CM | POA: Diagnosis not present

## 2019-04-28 DIAGNOSIS — M797 Fibromyalgia: Secondary | ICD-10-CM | POA: Diagnosis not present

## 2019-04-29 ENCOUNTER — Encounter: Payer: Self-pay | Admitting: Gastroenterology

## 2019-04-29 ENCOUNTER — Other Ambulatory Visit: Payer: Self-pay

## 2019-04-29 ENCOUNTER — Telehealth (INDEPENDENT_AMBULATORY_CARE_PROVIDER_SITE_OTHER): Payer: Medicare Other | Admitting: Gastroenterology

## 2019-04-29 ENCOUNTER — Other Ambulatory Visit (HOSPITAL_COMMUNITY)
Admission: RE | Admit: 2019-04-29 | Discharge: 2019-04-29 | Disposition: A | Discharge status: Home / Self Care | Payer: Medicare Other | Source: Ambulatory Visit | Attending: Gastroenterology | Admitting: Gastroenterology

## 2019-04-29 VITALS — Ht 65.0 in | Wt 213.0 lb

## 2019-04-29 DIAGNOSIS — K581 Irritable bowel syndrome with constipation: Secondary | ICD-10-CM | POA: Diagnosis not present

## 2019-04-29 DIAGNOSIS — R197 Diarrhea, unspecified: Secondary | ICD-10-CM

## 2019-04-29 DIAGNOSIS — Z20828 Contact with and (suspected) exposure to other viral communicable diseases: Secondary | ICD-10-CM | POA: Insufficient documentation

## 2019-04-29 DIAGNOSIS — R112 Nausea with vomiting, unspecified: Secondary | ICD-10-CM | POA: Diagnosis not present

## 2019-04-29 MED ORDER — OXYCODONE-ACETAMINOPHEN 5-325 MG PO TABS: 1.0000 | ORAL_TABLET | Freq: Three times a day (TID) | ORAL | 0 refills | Status: DC | PRN

## 2019-04-29 NOTE — Progress Notes (Signed)
Chief Complaint: Abdominal pain  Referring Provider:  Cher Nakai, MD      ASSESSMENT AND PLAN;   #1. N/V/abdominal pain 04/22/2019 prompting visit to ED at [facility].  Likely acute gastroenteritis.  Being treated with Cipro.  R/O COVID-19.  #2. Chronic LUQ and epigastric pain- neg EGD 08/2017 with neg eso bx for EoE. S/p lap chole in the past. Neg solid-phase GES 08/2016, neg CT abdo/pel 05/2018, neg CTA 03/12/2019.  Likely functional dyspepsia/abdominal wall pain.   #2.  IBS with predom constipation, neg colon 07/04/2017 except for small posterior anal fissure.  Plan: - Percocet 5/325mg  1 Q8hr prn (#20) while we are waiting for records from [facility]. - Continue docusate sodium 1 tab po qd - Continue omeprazole 40 mg p.o. once a day. - Continue Bentyl 10mg  po bid. Can increase it to QID if needed. - Covid-19 antigen testing if possible. - Please obtain previous records from [facility] regarding recent CT, labs, stool cultures. - FU televisit in 4 weeks.   HPI:    Kelly Haas is a 48 y.o. female  For follow-up visit   Last Thursday 04/22/2019-after attending her son's graduation she started having increasing epigastric/left upper quadrant abdominal pain associated with nausea/vomiting.  She was seen in the ED at [facility] Abdo/pelvis which according to the patient was unremarkable.  Her white cell count was elevated.  She was given Cipro 500 mg p.o. twice daily for 10 days.  She has 3 days left.  She was also given Percocet 5/325 which she has been using on as-needed basis.  This did help with the pain.  Zofran helped with nausea/vomiting.  Her stool cultures were ordered as well.   Still with sharp epi and LUQ pain and lower pain, better with perocet. . Overall does feel better.  No diarrhea and constipation now. Little mushy.  Negative CTA Abdo/pelvis 03/12/2019 was reviewed independently.  No fever or chills now.  No abdominal pain.  Able to tolerate p.o. without any  problems.  I have also reviewed cardiology visit from 03/16/2019.  She was having chest pains intermittently.  Cardiology is planning to obtain a stress test in the next few weeks.  She was also given nitroglycerin as needed.  Past Medical History:  Diagnosis Date  . Depression with anxiety   . Fibromyalgia   . G6PD deficiency   . Hypertension   . Kidney stone   . Migraine   . Seizure ([place])   . Seizure disorder ([place]) 11/13/2017  . Thyroid disease     Past Surgical History:  Procedure Laterality Date  . ABDOMINAL HYSTERECTOMY    . APPENDECTOMY    . CHOLECYSTECTOMY    . ESOPHAGOGASTRODUODENOSCOPY  08/29/2017   Mild gastritis. Stauts post empiric esophageal dilitation.   . TOE SURGERY Left    removed toe nail  . TONSILLECTOMY AND ADENOIDECTOMY      Family History  Problem Relation Age of Onset  . Hypertension Mother   . Kidney cancer Mother   . Heart attack Father        59  . Seizures Father   . Stroke Maternal Grandmother   . Pancreatic cancer Maternal Grandfather   . Prostate cancer Maternal Grandfather   . Lung cancer Maternal Grandfather     Social History   Tobacco Use  . Smoking status: Never Smoker  . Smokeless tobacco: Never Used  Substance Use Topics  . Alcohol use: No    Frequency: Never  .  Drug use: No    Current Outpatient Medications  Medication Sig Dispense Refill  . amLODipine (NORVASC) 5 MG tablet Take 5 mg by mouth daily.     Marland Kitchen aspirin EC 81 MG tablet Take 81 mg by mouth daily.    . benzonatate (TESSALON) 100 MG capsule Take by mouth 3 (three) times daily as needed for cough.    . busPIRone (BUSPAR) 10 MG tablet Take 10 mg by mouth 2 (two) times daily.    . ciprofloxacin (CIPRO) 500 MG tablet Take 500 mg by mouth 2 (two) times daily.    . clonazePAM (KLONOPIN) 0.5 MG tablet Take 0.5 mg by mouth 2 (two) times daily as needed for anxiety.    . cyclobenzaprine (FLEXERIL) 10 MG tablet Take 1 tablet (10 mg total) by mouth 3 (three) times daily as  needed for Muscle spasms.  3  . dicyclomine (BENTYL) 10 MG capsule Take 1 capsule (10 mg total) by mouth 2 (two) times daily. 90 capsule 0  . divalproex (DEPAKOTE) 500 MG DR tablet Take 500 mg by mouth 2 (two) times daily.    Marland Kitchen EPINEPHrine 0.3 mg/0.3 mL IJ SOAJ injection Inject 0.3 mg into the muscle as needed.     Marland Kitchen escitalopram (LEXAPRO) 20 MG tablet Take 20 mg by mouth daily.     . folic acid (FOLVITE) 1 MG tablet Take 1 mg by mouth daily.  3  . GOODSENSE STIMULANT LAXATIVE 8.6-50 MG tablet Take 1 tablet by mouth 2 (two) times daily. (Patient taking differently: 1 tablet at bedtime as needed. ) 60 tablet 3  . levothyroxine (SYNTHROID, LEVOTHROID) 50 MCG tablet Take by mouth every other day.    . levothyroxine (SYNTHROID, LEVOTHROID) 75 MCG tablet Take by mouth every other day.    Marland Kitchen LYRICA 50 MG capsule Take 1 capsule by mouth 2 (two) times daily.   5  . nitroGLYCERIN (NITROSTAT) 0.4 MG SL tablet Place 1 tablet (0.4 mg total) under the tongue every 5 (five) minutes as needed for chest pain. 25 tablet 3  . omeprazole (PRILOSEC) 40 MG capsule Take 40 mg by mouth daily.  5  . ondansetron (ZOFRAN) 4 MG tablet Take 4 mg by mouth every 6 (six) hours as needed.    . predniSONE (DELTASONE) 5 MG tablet Take 5 mg by mouth as needed.    . ranitidine (ZANTAC) 150 MG tablet Take 300 mg by mouth at bedtime.  4  . zolpidem (AMBIEN) 10 MG tablet Take 10 mg by mouth at bedtime as needed. for sleep  0   No current facility-administered medications for this visit.     Allergies  Allergen Reactions  . Bee Venom Anaphylaxis  . Ibuprofen Hives, Nausea And Vomiting and Rash    Other reaction(s): Vomiting (intolerance)  . Acetaminophen   . Aminolevulinate Derivatives   . Aspirin-Acetaminophen-Caffeine   . Dapsone     Other reaction(s): Other (See Comments)  . Dimercaprol     Other reaction(s): Other (See Comments)  . Methylcellulose   . Nitrofuran Derivatives   . Phenazopyridine   . Primaquine   .  Toluidine Blue   . Latex Rash  . Oxycodone Rash  . Phenergan [Promethazine Hcl] Rash    Review of Systems:  Reviewed     Physical Exam:    Ht 5\' 5"  (1.651 m)   Wt 213 lb (96.6 kg)   BMI 35.45 kg/m    Data Reviewed: I have personally reviewed following labs and imaging studies  CBC: CBC Latest Ref Rng & Units 08/27/2018 06/02/2018 11/24/2017  WBC 3.4 - 10.8 x10E3/uL 6.9 7.1 6.5  Hemoglobin 11.1 - 15.9 g/dL 12.7 14.6 13.3  Hematocrit 34.0 - 46.6 % 37.1 42.4 40.3  Platelets 150 - 450 x10E3/uL 304 329.0 354    CMP: CMP Latest Ref Rng & Units 08/27/2018 06/02/2018 11/24/2017  Glucose 65 - 99 mg/dL 108(H) 97 87  BUN 6 - 24 mg/dL 11 9 7   Creatinine 0.57 - 1.00 mg/dL 0.79 1.02 0.82  Sodium 134 - 144 mmol/L 142 140 145(H)  Potassium 3.5 - 5.2 mmol/L 4.4 4.7 4.5  Chloride 96 - 106 mmol/L 103 103 107(H)  CO2 20 - 29 mmol/L 24 31 22   Calcium 8.7 - 10.2 mg/dL 9.2 9.6 9.6  Total Protein 6.0 - 8.5 g/dL 6.3 7.1 7.0  Total Bilirubin 0.0 - 1.2 mg/dL 0.3 0.4 0.3  Alkaline Phos 39 - 117 IU/L 81 84 81  AST 0 - 40 IU/L 16 12 15   ALT 0 - 32 IU/L 22 14 14    This service was provided via telemedicine.  The patient was located at home.  The provider was located in office.  The patient did consent to this telephone visit and is aware of possible charges through their insurance for this visit.  Time spent on call and coordination of care: 25 min     Carmell Austria, MD 04/29/2019, 11:05 AM  Cc: Cher Nakai, MD

## 2019-04-29 NOTE — Patient Instructions (Signed)
If you are age 47 or older, your body mass index should be between 23-30. Your Body mass index is 35.45 kg/m. If this is out of the aforementioned range listed, please consider follow up with your Primary Care Provider.  If you are age 77 or younger, your body mass index should be between 19-25. Your Body mass index is 35.45 kg/m. If this is out of the aformentioned range listed, please consider follow up with your Primary Care Provider.    We have sent the following medications to your pharmacy for you to pick up at your convenience: Percocet 5/325mg  1 Q8hr prn (#20) while we are waiting for records from [facility].   - Continue docusate sodium 1 tab po qd - Continue omeprazole 40 mg p.o. once a day. - Continue Bentyl 10mg  po bid. Can increase it to QID if needed.  - Covid-19 antigen testing  - FU televisit in 4 weeks.  Thank you,  Dr. Jackquline Denmark

## 2019-04-30 LAB — NOVEL CORONAVIRUS, NAA (HOSP ORDER, SEND-OUT TO REF LAB; TAT 18-24 HRS): SARS-CoV-2, NAA: NOT DETECTED

## 2019-04-30 NOTE — Progress Notes (Signed)
Addendum:   Notes from [facility] reviewed. ED visit 04/22/2019: -Had elevated WBC count of 14.9K with left shift.  Neutrophils 80%.  Normal hemoglobin 14.5.  Normal CMP except glucose 143, normal lipase -Stool positive for WBCs.  Negative cultures including Salmonella Shigella E. coli Campylobacter and Shiga toxin -CT with p.o. and IV contrast 04/22/2019: Negative.  No colitis  Patient given Cipro 500 mg p.o. twice daily for 10 days, Percocet 5/325 as needed and Zofran as needed.

## 2019-05-03 ENCOUNTER — Telehealth: Payer: Self-pay | Admitting: Gastroenterology

## 2019-05-03 NOTE — Telephone Encounter (Signed)
The pt has been advised of the results by De Hollingshead

## 2019-05-03 NOTE — Telephone Encounter (Signed)
Pt called regarding results of COVID test

## 2019-05-05 ENCOUNTER — Ambulatory Visit: Payer: Medicare Other | Admitting: Neurology

## 2019-05-05 ENCOUNTER — Ambulatory Visit: Payer: Medicare Other | Admitting: Adult Health

## 2019-05-05 DIAGNOSIS — L501 Idiopathic urticaria: Secondary | ICD-10-CM | POA: Diagnosis not present

## 2019-05-06 NOTE — Telephone Encounter (Signed)
Patient called said that she never heard back about the covid testing. She had got the wrong test done and someone was going to get back with her about possible having the correct one done.

## 2019-05-10 ENCOUNTER — Telehealth: Payer: Self-pay

## 2019-05-10 NOTE — Telephone Encounter (Signed)
Spoke with the patient and they have given verbal consent to file insurance and to do a mychart video visit. Mychart video link has been sent to the patient.   

## 2019-05-11 ENCOUNTER — Encounter: Payer: Self-pay | Admitting: Neurology

## 2019-05-11 ENCOUNTER — Telehealth (INDEPENDENT_AMBULATORY_CARE_PROVIDER_SITE_OTHER): Payer: Medicare Other | Admitting: Neurology

## 2019-05-11 ENCOUNTER — Other Ambulatory Visit: Payer: Self-pay

## 2019-05-11 DIAGNOSIS — G40909 Epilepsy, unspecified, not intractable, without status epilepticus: Secondary | ICD-10-CM | POA: Diagnosis not present

## 2019-05-11 MED ORDER — LAMOTRIGINE 25 MG PO TABS: 25.0000 mg | ORAL_TABLET | Freq: Every day | ORAL | 0 refills | Status: DC

## 2019-05-11 NOTE — Patient Instructions (Addendum)
I will send in a prescription for lamotrigine 25 mg tablets she will slowly increase her dose to a desired dose of lamotrigine 150 mg twice daily for seizure control:   -Start taking lamotrigine 25 mg twice daily x 2 weeks -Lamotrigine 50 mg twice daily x 2 weeks -Lamotrigine 75 mg twice daily x 2 weeks  -Call for new prescription for 100 mg tablets, then will take 100 mg twice daily x 2 weeks -Lamotrigine 100 mg in the morning, increase to 150 mg in the evening x 2 weeks  -Then increase Lamotrigine 150 mg in the morning, 150 mg in the evening   Lamotrigine tablets What is this medicine? LAMOTRIGINE (la MOE Hendricks Limes) is used to control seizures in adults and children with epilepsy and Lennox-Gastaut syndrome. It is also used in adults to treat bipolar disorder. This medicine may be used for other purposes; ask your health care provider or pharmacist if you have questions. COMMON BRAND NAME(S): Lamictal, Subvenite What should I tell my health care provider before I take this medicine? They need to know if you have any of these conditions: -aseptic meningitis during prior use of lamotrigine -depression -folate deficiency -kidney disease -liver disease -suicidal thoughts, plans, or attempt; a previous suicide attempt by you or a family member -an unusual or allergic reaction to lamotrigine or other seizure medications, other medicines, foods, dyes, or preservatives -pregnant or trying to get pregnant -breast-feeding How should I use this medicine? Take this medicine by mouth with a glass of water. Follow the directions on the prescription label. Do not chew these tablets. If this medicine upsets your stomach, take it with food or milk. Take your doses at regular intervals. Do not take your medicine more often than directed. A special MedGuide will be given to you by the pharmacist with each new prescription and refill. Be sure to read this information carefully each time. Talk to your  pediatrician regarding the use of this medicine in children. While this drug may be prescribed for children as young as 2 years for selected conditions, precautions do apply. Overdosage: If you think you have taken too much of this medicine contact a poison control center or emergency room at once. NOTE: This medicine is only for you. Do not share this medicine with others. What if I miss a dose? If you miss a dose, take it as soon as you can. If it is almost time for your next dose, take only that dose. Do not take double or extra doses. What may interact with this medicine? -atazanavir -carbamazepine -female hormones, including contraceptive or birth control pills -lopinavir -methotrexate -phenobarbital -phenytoin -primidone -pyrimethamine -rifampin -ritonavir -trimethoprim -valproic acid This list may not describe all possible interactions. Give your health care provider a list of all the medicines, herbs, non-prescription drugs, or dietary supplements you use. Also tell them if you smoke, drink alcohol, or use illegal drugs. Some items may interact with your medicine. What should I watch for while using this medicine? Visit your doctor or health care professional for regular checks on your progress. If you take this medicine for seizures, wear a Medic Alert bracelet or necklace. Carry an identification card with information about your condition, medicines, and doctor or health care professional. It is important to take this medicine exactly as directed. When first starting treatment, your dose will need to be adjusted slowly. It may take weeks or months before your dose is stable. You should contact your doctor or health care professional if  your seizures get worse or if you have any new types of seizures. Do not stop taking this medicine unless instructed by your doctor or health care professional. Stopping your medicine suddenly can increase your seizures or their severity. Contact your  doctor or health care professional right away if you develop a rash while taking this medicine. Rashes may be very severe and sometimes require treatment in the hospital. Deaths from rashes have occurred. Serious rashes occur more often in children than adults taking this medicine. It is more common for these serious rashes to occur during the first 2 months of treatment, but a rash can occur at any time. You may get drowsy, dizzy, or have blurred vision. Do not drive, use machinery, or do anything that needs mental alertness until you know how this medicine affects you. To reduce dizzy or fainting spells, do not sit or stand up quickly, especially if you are an older patient. Alcohol can increase drowsiness and dizziness. Avoid alcoholic drinks. If you are taking this medicine for bipolar disorder, it is important to report any changes in your mood to your doctor or health care professional. If your condition gets worse, you get mentally depressed, feel very hyperactive or manic, have difficulty sleeping, or have thoughts of hurting yourself or committing suicide, you need to get help from your health care professional right away. If you are a caregiver for someone taking this medicine for bipolar disorder, you should also report these behavioral changes right away. The use of this medicine may increase the chance of suicidal thoughts or actions. Pay special attention to how you are responding while on this medicine. Your mouth may get dry. Chewing sugarless gum or sucking hard candy, and drinking plenty of water may help. Contact your doctor if the problem does not go away or is severe. Women who become pregnant while using this medicine may enroll in the [place] Pregnancy Registry by calling 7312844107. This registry collects information about the safety of antiepileptic drug use during pregnancy. This medicine may cause a decrease in folic acid. You should make sure that you  get enough folic acid while you are taking this medicine. Discuss the foods you eat and the vitamins you take with your health care professional. What side effects may I notice from receiving this medicine? Side effects that you should report to your doctor or health care professional as soon as possible: -allergic reactions like skin rash, itching or hives, swelling of the face, lips, or tongue -changes in vision -depressed mood -elevated mood, decreased need for sleep, racing thoughts, impulsive behavior -fever with rash, swollen lymph nodes, or swelling of the face -loss of balance or coordination -mouth sores -redness, blistering, peeling or loosening of the skin, including inside the mouth -right upper belly pain -seizures -severe muscle pain -signs and symptoms of aseptic meningitis such as stiff neck and sensitivity to light, headache, drowsiness, fever, nausea, vomiting, rash -signs of infection - fever or chills, cough, sore throat, pain or difficulty passing urine -suicidal thoughts or other mood changes -swollen lymph nodes -trouble walking -unusual bruising or bleeding -unusually weak or tired -yellowing of the eyes or skin Side effects that usually do not require medical attention (report to your doctor or health care professional if they continue or are bothersome): -diarrhea -dizziness -dry mouth -stuffy nose -tiredness -tremors -trouble sleeping This list may not describe all possible side effects. Call your doctor for medical advice about side effects. You may report side effects  to FDA at 1-800-FDA-1088. Where should I keep my medicine? Keep out of reach of children. Store at room temperature between 15 and 30 degrees C (59 and 86 degrees F). Throw away any unused medicine after the expiration date. NOTE: This sheet is a summary. It may not cover all possible information. If you have questions about this medicine, talk to your doctor, pharmacist, or health care  provider.  2019 Elsevier/Gold Standard (2017-06-23 16:07:39)

## 2019-05-11 NOTE — Progress Notes (Signed)
I have read the note, and I agree with the clinical assessment and plan.  Shaquetta Arcos K Victoriah Wilds   

## 2019-05-11 NOTE — Progress Notes (Signed)
Virtual Visit via Video Note  I connected with Kelly Haas on 05/11/19 at 10:45 AM EDT by a video enabled telemedicine application and verified that I am speaking with the correct person using two identifiers.  Location: Patient: At her home  Provider: In the office    I discussed the limitations of evaluation and management by telemedicine and the availability of in person appointments. The patient expressed understanding and agreed to proceed.  History of Present Illness: 05/11/2019 SS: Kelly Haas is a 48 year old female with history of seizures.  She is prescribed Depakote 500 mg twice daily.  She reports she has not been compliant with the medication, may only take 1 tablet daily, if that.  She reports since her, she has had 2 seizures.  The first occurred while she was sitting on the bed, her daughter came in, witnessed the seizure, was described as generalized jerking.  The second occurred while she was walking out the door, again was witnessed, described as generalized jerking.  She says with both the events she had urinary incontinence. The paramedics were called, but she was not transported to the hospital, she did not want any record of the event that may impact her ability to drive.  She says she is not willing to take the Depakote consistently due to the side effect of hair loss.  She is tearful when describing this. She says she has tried other seizure medications in the past, but she is not able to remember the names.  Whenever she started on the Depakote, she was told side effect of hair loss, but now the side effect is really affecting her.  She is also undergoing work-up for abdominal pain with her GI doctor.  She had a recent visit to [facility] for abdominal pain, was diagnosed with an intestinal infection, was started on Cipro and Percocet for pain.  She had a CT scan that was normal.  She says she has passed out several times from abdominal pain. Her last seizure was in  April 2020.  09/02/2018 MM: Kelly Haas is a 48 year old female with a history of seizures.  She returns today for follow-up.  She reports that she has not had any seizure events.  She does state that in the last couple weeks she has had several episodes where she has woken up and she has chewed on her jaws during the night.  She is unsure if this is seizure events.  She does not have anybody that sleeps with her.  She does states that she does not feel any different the next day.  She does note that sometimes she will miss a dose of Depakote.  Reports that she typically will miss a morning dose.  At the last visit she was complaining of numbness in hands.  Nerve conduction studies was done.  Dr. Jannifer Franklin recommended an athletic elbow pad for the left arm for possible ulnar neuropathy.  The patient has not got this.  She states that she continues to have numbness intermittently.  She returns today for evaluation.   Observations/Objective: Alert, is tearful, answers questions appropriately, however reluctantly, facial symmetry noted, symmetric eye movements, no arm drift, gait is intact  Assessment and Plan: 1.  Seizures  Since her last visit, she has had 2 seizure events, described as generalized jerking.  She is not compliant with her Depakote as result of the side effect of hair loss.  When both seizure events occurred, she says she was not taking  Depakote.  She is undergoing work-up with her GI doctor for an intestinal infection, abdominal pain.  Her abdominal issues seem to be preoccupying her.  I have told her it is not safe for her to drive until she is seizure-free for 6 months. She reports a history of seizures that began in her early 76's. She is not willing to be compliant with her Depakote dose of 500 mg tablet twice daily. I discussed with Dr. Jannifer Franklin, for this reason, we will switch her to lamotrigine slowly titrating up to 150 mg twice a day. She will go ahead and stop the Depakote, which she  isn't taking regularly anyway. There is risk for significant interaction with Depakote and lamotrigine. I will send in a prescription for lamotrigine 25 mg tablets she will slowly increase her dose to a desired dose of lamotrigine 150 mg twice daily for seizure control:   -Start taking lamotrigine 25 mg twice daily x 2 weeks -Lamotrigine 50 mg twice daily x 2 weeks -Lamotrigine 75 mg twice daily x 2 weeks  -Call for new prescription for 100 mg tablets, then will take 100 mg twice daily x 2 weeks -Lamotrigine 100 mg in the morning, increase to 150 mg in the evening x 2 weeks  -Then increase Lamotrigine 150 mg in the morning, 150 mg in the evening   She will follow-up in 3 months for a recheck. She can take an OTC vitamin, Centrum to hopefully help with hair loss. If, the hair loss is related to the Depakote, her hair should return, but it may be different in texture. I have generated an AVS that she should be able to view with instructions. We reviewed side effects of Lamictal.   Follow Up Instructions: 3 months 08/17/2019 9:15   I discussed the assessment and treatment plan with the patient. The patient was provided an opportunity to ask questions and all were answered. The patient agreed with the plan and demonstrated an understanding of the instructions.   The patient was advised to call back or seek an in-person evaluation if the symptoms worsen or if the condition fails to improve as anticipated.  I provided 15 minutes of non-face-to-face time during this encounter.   Evangeline Dakin, DNP  [facility] [address], [place], [place] [address] [phone]

## 2019-05-18 ENCOUNTER — Telehealth (INDEPENDENT_AMBULATORY_CARE_PROVIDER_SITE_OTHER): Payer: Medicare Other | Admitting: Cardiology

## 2019-05-18 ENCOUNTER — Encounter: Payer: Self-pay | Admitting: Cardiology

## 2019-05-18 ENCOUNTER — Other Ambulatory Visit: Payer: Self-pay

## 2019-05-18 VITALS — Ht 65.0 in | Wt 213.0 lb

## 2019-05-18 DIAGNOSIS — R55 Syncope and collapse: Secondary | ICD-10-CM

## 2019-05-18 DIAGNOSIS — H539 Unspecified visual disturbance: Secondary | ICD-10-CM | POA: Diagnosis not present

## 2019-05-18 DIAGNOSIS — H53483 Generalized contraction of visual field, bilateral: Secondary | ICD-10-CM | POA: Diagnosis not present

## 2019-05-18 NOTE — Progress Notes (Signed)
Virtual Visit via Video Note   This visit type was conducted due to national recommendations for restrictions regarding the COVID-19 Pandemic (e.g. social distancing) in an effort to limit this patient's exposure and mitigate transmission in our community.  Due to her co-morbid illnesses, this patient is at least at moderate risk for complications without adequate follow up.  This format is felt to be most appropriate for this patient at this time.  All issues noted in this document were discussed and addressed.  A limited physical exam was performed with this format.  Please refer to the patient's chart for her consent to telehealth for [facility].   Date:  05/18/2019   ID:  Kelly Haas, DOB 1971/01/23, MRN [id number]  Patient Location: Home Provider Location: Home  PCP:  Cher Nakai, MD  Cardiologist:  No primary care provider on file.  Electrophysiologist:  None   Evaluation Performed:  Follow-Up Visit  Chief Complaint: Essential hypertension and history of syncope  History of Present Illness:    Kelly Haas is a 48 y.o. female with recurrent syncope from a very young age.  Patient is here for follow-up for the same reason.  She denies any chest pain orthopnea or PND.  She gets panic attack at times.  No orthopnea or PND.  She is otherwise an active lady and activity does not bring around any symptoms.  At the time of my evaluation, the patient is alert awake oriented and in no distress.  The patient does not have symptoms concerning for COVID-19 infection (fever, chills, cough, or new shortness of breath).    Past Medical History:  Diagnosis Date  . Depression with anxiety   . Fibromyalgia   . G6PD deficiency   . Hypertension   . Kidney stone   . Migraine   . Seizure ([place])   . Seizure disorder ([place]) 11/13/2017  . Thyroid disease    Past Surgical History:  Procedure Laterality Date  . ABDOMINAL HYSTERECTOMY    . APPENDECTOMY    . CHOLECYSTECTOMY    .  ESOPHAGOGASTRODUODENOSCOPY  08/29/2017   Mild gastritis. Stauts post empiric esophageal dilitation.   . TOE SURGERY Left    removed toe nail  . TONSILLECTOMY AND ADENOIDECTOMY       Current Meds  Medication Sig  . amLODipine (NORVASC) 5 MG tablet Take 5 mg by mouth daily.   Marland Kitchen aspirin EC 81 MG tablet Take 81 mg by mouth daily.  . benzonatate (TESSALON) 100 MG capsule Take by mouth 3 (three) times daily as needed for cough.  . busPIRone (BUSPAR) 10 MG tablet Take 10 mg by mouth 2 (two) times daily.  . clonazePAM (KLONOPIN) 0.5 MG tablet Take 0.5 mg by mouth 2 (two) times daily as needed for anxiety.  . cyclobenzaprine (FLEXERIL) 10 MG tablet Take 1 tablet (10 mg total) by mouth 3 (three) times daily as needed for Muscle spasms.  Marland Kitchen dicyclomine (BENTYL) 10 MG capsule Take 1 capsule (10 mg total) by mouth 2 (two) times daily.  Marland Kitchen EPINEPHrine 0.3 mg/0.3 mL IJ SOAJ injection Inject 0.3 mg into the muscle as needed.   . folic acid (FOLVITE) 1 MG tablet Take 1 mg by mouth daily.  Marland Kitchen GOODSENSE STIMULANT LAXATIVE 8.6-50 MG tablet Take 1 tablet by mouth 2 (two) times daily. (Patient taking differently: 1 tablet at bedtime as needed. )  . lamoTRIgine (LAMICTAL) 25 MG tablet Take 1 tablet (25 mg total) by mouth daily. Lamotrigine Titration Instructions with 25  mg tablets: Start taking 1 tablet (25 mg) twice daily for 2 weeks Then take 2 tablets (50 mg) twice daily for 2 weeks Then take 3 tablets (75 mg) twice daily for 2 weeks Call our office upon completion, and I will send in a new prescription for 100 mg tablets and further instructions  . levothyroxine (SYNTHROID, LEVOTHROID) 50 MCG tablet Take by mouth every other day.  . levothyroxine (SYNTHROID, LEVOTHROID) 75 MCG tablet Take by mouth every other day.  Marland Kitchen LYRICA 50 MG capsule Take 1 capsule by mouth 2 (two) times daily.   . nitroGLYCERIN (NITROSTAT) 0.4 MG SL tablet Place 1 tablet (0.4 mg total) under the tongue every 5 (five) minutes as needed for  chest pain.  Marland Kitchen omeprazole (PRILOSEC) 40 MG capsule Take 40 mg by mouth daily.  . ondansetron (ZOFRAN) 4 MG tablet Take 4 mg by mouth every 6 (six) hours as needed.  Marland Kitchen oxyCODONE-acetaminophen (PERCOCET) 5-325 MG tablet Take 1 tablet by mouth every 8 (eight) hours as needed for severe pain.  . predniSONE (DELTASONE) 5 MG tablet Take 5 mg by mouth as needed.  . ranitidine (ZANTAC) 150 MG tablet Take 300 mg by mouth at bedtime.  Marland Kitchen zolpidem (AMBIEN) 10 MG tablet Take 10 mg by mouth at bedtime as needed. for sleep     Allergies:   Bee venom, Ibuprofen, Acetaminophen, Aminolevulinate derivatives, Aspirin-acetaminophen-caffeine, Dapsone, Dimercaprol, Methylcellulose, Nitrofuran derivatives, Phenazopyridine, Primaquine, Toluidine blue, Latex, Oxycodone, and Phenergan [promethazine hcl]   Social History   Tobacco Use  . Smoking status: Never Smoker  . Smokeless tobacco: Never Used  Substance Use Topics  . Alcohol use: No    Frequency: Never  . Drug use: No     Family Hx: The patient's family history includes Heart attack in her father; Hypertension in her mother; Kidney cancer in her mother; Lung cancer in her maternal grandfather; Pancreatic cancer in her maternal grandfather; Prostate cancer in her maternal grandfather; Seizures in her father; Stroke in her maternal grandmother.  ROS:   Please see the history of present illness.    As mentioned above All other systems reviewed and are negative.   Prior CV studies:   The following studies were reviewed today:  None  Labs/Other Tests and Data Reviewed:    EKG:  No ECG reviewed.  Recent Labs: 08/27/2018: ALT 22; BUN 11; Creatinine, Ser 0.79; Hemoglobin 12.7; Platelets 304; Potassium 4.4; Sodium 142   Recent Lipid Panel No results found for: CHOL, TRIG, HDL, CHOLHDL, LDLCALC, LDLDIRECT  Wt Readings from Last 3 Encounters:  05/18/19 213 lb (96.6 kg)  04/29/19 213 lb (96.6 kg)  03/16/19 217 lb (98.4 kg)     Objective:    Vital  Signs:  Ht 5\' 5"  (1.651 m)   Wt 213 lb (96.6 kg)   BMI 35.45 kg/m    VITAL SIGNS:  reviewed  ASSESSMENT & PLAN:    1. Essential hypertension: Her blood pressure is stable.  Dietary issues were discussed and the importance of exercise stressed with the patient and the patient vocalized understanding.  Lifestyle modification was also urged.  Her blood pressure is stable overall.  She is at the eye doctor today and will call us with a pulse and blood pressure readings so we can documented in our chart and do the needful if necessary 2. History of syncope.  She has had recurrent syncopes in the past and this is been stable of late.  Her blood work is done by her primary  care physician.Patient will be seen in follow-up appointment in 6 months or earlier if the patient has any concerns   COVID-19 Education: The signs and symptoms of COVID-19 were discussed with the patient and how to seek care for testing (follow up with PCP or arrange E-visit).  The importance of social distancing was discussed today.  Time:   Today, I have spent 15 minutes with the patient with telehealth technology discussing the above problems.     Medication Adjustments/Labs and Tests Ordered: Current medicines are reviewed at length with the patient today.  Concerns regarding medicines are outlined above.   Tests Ordered: No orders of the defined types were placed in this encounter.   Medication Changes: No orders of the defined types were placed in this encounter.   Follow Up:  Virtual Visit or In Person in 3 month(s)  Signed, Jenean Lindau, MD  05/18/2019 8:44 AM    [place]

## 2019-05-18 NOTE — Patient Instructions (Signed)

## 2019-05-20 DIAGNOSIS — N939 Abnormal uterine and vaginal bleeding, unspecified: Secondary | ICD-10-CM

## 2019-05-20 DIAGNOSIS — N938 Other specified abnormal uterine and vaginal bleeding: Secondary | ICD-10-CM | POA: Diagnosis not present

## 2019-05-20 DIAGNOSIS — N760 Acute vaginitis: Secondary | ICD-10-CM | POA: Diagnosis not present

## 2019-05-20 DIAGNOSIS — Z90710 Acquired absence of both cervix and uterus: Secondary | ICD-10-CM | POA: Diagnosis not present

## 2019-05-20 HISTORY — DX: Abnormal uterine and vaginal bleeding, unspecified: N93.9

## 2019-05-22 DIAGNOSIS — E039 Hypothyroidism, unspecified: Secondary | ICD-10-CM | POA: Diagnosis not present

## 2019-05-22 DIAGNOSIS — Z79891 Long term (current) use of opiate analgesic: Secondary | ICD-10-CM | POA: Diagnosis not present

## 2019-05-22 DIAGNOSIS — R069 Unspecified abnormalities of breathing: Secondary | ICD-10-CM | POA: Diagnosis not present

## 2019-05-22 DIAGNOSIS — Z209 Contact with and (suspected) exposure to unspecified communicable disease: Secondary | ICD-10-CM | POA: Diagnosis not present

## 2019-05-22 DIAGNOSIS — Z8669 Personal history of other diseases of the nervous system and sense organs: Secondary | ICD-10-CM | POA: Diagnosis not present

## 2019-05-22 DIAGNOSIS — R0602 Shortness of breath: Secondary | ICD-10-CM | POA: Diagnosis not present

## 2019-05-22 DIAGNOSIS — Z792 Long term (current) use of antibiotics: Secondary | ICD-10-CM | POA: Diagnosis not present

## 2019-05-22 DIAGNOSIS — J80 Acute respiratory distress syndrome: Secondary | ICD-10-CM | POA: Diagnosis not present

## 2019-05-22 DIAGNOSIS — R55 Syncope and collapse: Secondary | ICD-10-CM | POA: Diagnosis not present

## 2019-05-22 DIAGNOSIS — F410 Panic disorder [episodic paroxysmal anxiety] without agoraphobia: Secondary | ICD-10-CM | POA: Diagnosis not present

## 2019-05-22 DIAGNOSIS — Z79899 Other long term (current) drug therapy: Secondary | ICD-10-CM | POA: Diagnosis not present

## 2019-05-22 DIAGNOSIS — R079 Chest pain, unspecified: Secondary | ICD-10-CM | POA: Diagnosis not present

## 2019-05-22 DIAGNOSIS — R52 Pain, unspecified: Secondary | ICD-10-CM | POA: Diagnosis not present

## 2019-05-22 DIAGNOSIS — R000 Tachycardia, unspecified: Secondary | ICD-10-CM | POA: Diagnosis not present

## 2019-05-23 DIAGNOSIS — R079 Chest pain, unspecified: Secondary | ICD-10-CM | POA: Diagnosis not present

## 2019-05-23 DIAGNOSIS — R0602 Shortness of breath: Secondary | ICD-10-CM | POA: Diagnosis not present

## 2019-05-24 DIAGNOSIS — F419 Anxiety disorder, unspecified: Secondary | ICD-10-CM | POA: Diagnosis not present

## 2019-05-24 DIAGNOSIS — G5620 Lesion of ulnar nerve, unspecified upper limb: Secondary | ICD-10-CM | POA: Diagnosis not present

## 2019-05-24 DIAGNOSIS — R110 Nausea: Secondary | ICD-10-CM | POA: Diagnosis not present

## 2019-05-24 DIAGNOSIS — R55 Syncope and collapse: Secondary | ICD-10-CM | POA: Diagnosis not present

## 2019-06-02 DIAGNOSIS — H53483 Generalized contraction of visual field, bilateral: Secondary | ICD-10-CM | POA: Diagnosis not present

## 2019-06-03 DIAGNOSIS — R110 Nausea: Secondary | ICD-10-CM | POA: Diagnosis not present

## 2019-06-03 DIAGNOSIS — R55 Syncope and collapse: Secondary | ICD-10-CM | POA: Diagnosis not present

## 2019-06-03 DIAGNOSIS — M797 Fibromyalgia: Secondary | ICD-10-CM | POA: Diagnosis not present

## 2019-06-03 DIAGNOSIS — F419 Anxiety disorder, unspecified: Secondary | ICD-10-CM | POA: Diagnosis not present

## 2019-06-07 ENCOUNTER — Other Ambulatory Visit: Payer: Self-pay | Admitting: Gastroenterology

## 2019-06-09 DIAGNOSIS — F419 Anxiety disorder, unspecified: Secondary | ICD-10-CM | POA: Diagnosis not present

## 2019-06-09 DIAGNOSIS — M797 Fibromyalgia: Secondary | ICD-10-CM | POA: Diagnosis not present

## 2019-06-09 DIAGNOSIS — R110 Nausea: Secondary | ICD-10-CM | POA: Diagnosis not present

## 2019-06-09 DIAGNOSIS — R55 Syncope and collapse: Secondary | ICD-10-CM | POA: Diagnosis not present

## 2019-06-10 DIAGNOSIS — M797 Fibromyalgia: Secondary | ICD-10-CM | POA: Diagnosis not present

## 2019-06-10 DIAGNOSIS — F419 Anxiety disorder, unspecified: Secondary | ICD-10-CM | POA: Diagnosis not present

## 2019-06-10 DIAGNOSIS — R55 Syncope and collapse: Secondary | ICD-10-CM | POA: Diagnosis not present

## 2019-06-10 DIAGNOSIS — M159 Polyosteoarthritis, unspecified: Secondary | ICD-10-CM | POA: Diagnosis not present

## 2019-06-16 ENCOUNTER — Other Ambulatory Visit: Payer: Self-pay | Admitting: Gastroenterology

## 2019-06-16 DIAGNOSIS — M797 Fibromyalgia: Secondary | ICD-10-CM | POA: Diagnosis not present

## 2019-06-16 DIAGNOSIS — R110 Nausea: Secondary | ICD-10-CM | POA: Diagnosis not present

## 2019-06-16 DIAGNOSIS — F419 Anxiety disorder, unspecified: Secondary | ICD-10-CM | POA: Diagnosis not present

## 2019-06-16 DIAGNOSIS — R55 Syncope and collapse: Secondary | ICD-10-CM | POA: Diagnosis not present

## 2019-06-21 DIAGNOSIS — H43393 Other vitreous opacities, bilateral: Secondary | ICD-10-CM | POA: Diagnosis not present

## 2019-06-21 DIAGNOSIS — H5203 Hypermetropia, bilateral: Secondary | ICD-10-CM | POA: Diagnosis not present

## 2019-06-21 DIAGNOSIS — H52202 Unspecified astigmatism, left eye: Secondary | ICD-10-CM | POA: Diagnosis not present

## 2019-06-21 DIAGNOSIS — H524 Presbyopia: Secondary | ICD-10-CM | POA: Diagnosis not present

## 2019-06-21 DIAGNOSIS — H11132 Conjunctival pigmentations, left eye: Secondary | ICD-10-CM | POA: Diagnosis not present

## 2019-06-21 DIAGNOSIS — H43823 Vitreomacular adhesion, bilateral: Secondary | ICD-10-CM | POA: Diagnosis not present

## 2019-07-01 DIAGNOSIS — G5620 Lesion of ulnar nerve, unspecified upper limb: Secondary | ICD-10-CM | POA: Diagnosis not present

## 2019-07-01 DIAGNOSIS — M797 Fibromyalgia: Secondary | ICD-10-CM | POA: Diagnosis not present

## 2019-07-01 DIAGNOSIS — R110 Nausea: Secondary | ICD-10-CM | POA: Diagnosis not present

## 2019-07-01 DIAGNOSIS — R55 Syncope and collapse: Secondary | ICD-10-CM | POA: Diagnosis not present

## 2019-07-06 DIAGNOSIS — L501 Idiopathic urticaria: Secondary | ICD-10-CM | POA: Diagnosis not present

## 2019-07-15 DIAGNOSIS — G5620 Lesion of ulnar nerve, unspecified upper limb: Secondary | ICD-10-CM | POA: Diagnosis not present

## 2019-07-15 DIAGNOSIS — R110 Nausea: Secondary | ICD-10-CM | POA: Diagnosis not present

## 2019-07-15 DIAGNOSIS — R55 Syncope and collapse: Secondary | ICD-10-CM | POA: Diagnosis not present

## 2019-07-15 DIAGNOSIS — M797 Fibromyalgia: Secondary | ICD-10-CM | POA: Diagnosis not present

## 2019-07-30 ENCOUNTER — Other Ambulatory Visit: Payer: Self-pay | Admitting: Neurology

## 2019-08-02 ENCOUNTER — Other Ambulatory Visit: Payer: Self-pay | Admitting: Gastroenterology

## 2019-08-02 NOTE — Telephone Encounter (Signed)
Spoke to pt and she will take lamotrigine 100mg  po bid for 2 wks.  (gave her 2 wks worth will then go to 150mg  po bid).

## 2019-08-02 NOTE — Telephone Encounter (Signed)
LMVM for pt to return call tomorrow. 

## 2019-08-03 DIAGNOSIS — L501 Idiopathic urticaria: Secondary | ICD-10-CM | POA: Diagnosis not present

## 2019-08-03 NOTE — Telephone Encounter (Signed)
I see she has revisit for 9/29. Plan for her to get to Lamictal 150 mg BID. Looks like you spoke with her and have plan in place.

## 2019-08-16 NOTE — Progress Notes (Signed)
PATIENT: Kelly Haas DOB: November 12, 1971  REASON FOR VISIT: follow up HISTORY FROM: patient  HISTORY OF PRESENT ILLNESS: Today 08/17/19  Kelly Haas is a 48 year old female with history of seizures.  In the past she was taking Depakote, but she is not taking the medication compliantly.  She felt the Depakote caused hair loss.  Her last seizure occurred in April 2020 was described as a generalized seizure.  After her last visit in June 2020, she was switched to Lamictal.  She is currently taking Lamictal 100 mg twice a day.  She indicates she is tolerating medication well.  She says she has not had recurrent seizure.  She does report she was in the hospital in July for panic attacks.  She is currently taking BuSpar and clonazepam for anxiety.  She reports she has been waking up in the middle of the night a few times a week, feeling like she is shaking.  She says this is not a seizure.  She says this feels like her anxiety or panic attacks.  During the episodes, she is alert, denies incontinence or oral injury.  She sees Dr. Truman Hayward in [facility] for her primary care doctor.  She presents today for follow-up unaccompanied.  05/11/2019 SS: Kelly Haas is a 48 year old female with history of seizures.  She is prescribed Depakote 500 mg twice daily.  She reports she has not been compliant with the medication, may only take 1 tablet daily, if that.  She reports since her, she has had 2 seizures.  The first occurred while she was sitting on the bed, her daughter came in, witnessed the seizure, was described as generalized jerking.  The second occurred while she was walking out the door, again was witnessed, described as generalized jerking.  She says with both the events she had urinary incontinence. The paramedics were called, but she was not transported to the hospital, she did not want any record of the event that may impact her ability to drive.  She says she is not willing to take the Depakote consistently due to  the side effect of hair loss.  She is tearful when describing this. She says she has tried other seizure medications in the past, but she is not able to remember the names.  Whenever she started on the Depakote, she was told side effect of hair loss, but now the side effect is really affecting her.  She is also undergoing work-up for abdominal pain with her GI doctor.  She had a recent visit to [facility] for abdominal pain, was diagnosed with an intestinal infection, was started on Cipro and Percocet for pain.  She had a CT scan that was normal.  She says she has passed out several times from abdominal pain. Her last seizure was in April 2020.  09/02/2018 MM: Kelly Haas a 48 year old female with a history of seizures. She returns today for follow-up. She reports that she has not had any seizure events. She does state that in the last couple weeks she has had several episodes where she has woken up and she has chewed on her jaws during the night. She is unsure if this is seizure events. She does not have anybody that sleeps with her.She does statesthat she does not feel anydifferent the next day. She does note that sometimes she will miss a dose of Depakote. Reports that she typically will miss a morning dose. At the last visit she was complaining of numbness in hands. Nerve conduction  studies was done. Dr. Jannifer Franklin recommended an athletic elbow pad for the left arm for possible ulnar neuropathy. The patient has not got this. She states that she continues to have numbness intermittently. She returns today for evaluation.  REVIEW OF SYSTEMS: Out of a complete 14 system review of symptoms, the patient complains only of the following symptoms, and all other reviewed systems are negative.  Memory loss, dizziness, headache, numbness, tremors  ALLERGIES: Allergies  Allergen Reactions   Bee Venom Anaphylaxis   Ibuprofen Hives, Nausea And Vomiting and Rash    Other reaction(s):  Vomiting (intolerance)   Acetaminophen    Aminolevulinate Derivatives    Aspirin-Acetaminophen-Caffeine    Dapsone     Other reaction(s): Other (See Comments)   Dimercaprol     Other reaction(s): Other (See Comments)   Methylcellulose    Nitrofuran Derivatives    Phenazopyridine    Primaquine    Toluidine Blue    Latex Rash   Oxycodone Rash   Phenergan [Promethazine Hcl] Rash    HOME MEDICATIONS: Outpatient Medications Prior to Visit  Medication Sig Dispense Refill   amLODipine (NORVASC) 5 MG tablet Take 5 mg by mouth daily.      aspirin EC 81 MG tablet Take 81 mg by mouth daily.     benzonatate (TESSALON) 100 MG capsule Take by mouth 3 (three) times daily as needed for cough.     busPIRone (BUSPAR) 10 MG tablet Take 10 mg by mouth 2 (two) times daily.     clonazePAM (KLONOPIN) 0.5 MG tablet Take 0.5 mg by mouth 2 (two) times daily as needed for anxiety.     cyclobenzaprine (FLEXERIL) 10 MG tablet Take 1 tablet (10 mg total) by mouth 3 (three) times daily as needed for Muscle spasms.  3   dicyclomine (BENTYL) 10 MG capsule Take 1 capsule (10 mg total) by mouth 2 (two) times daily. 90 capsule 0   EPINEPHrine 0.3 mg/0.3 mL IJ SOAJ injection Inject 0.3 mg into the muscle as needed.      folic acid (FOLVITE) 1 MG tablet Take 1 mg by mouth daily.  3   GOODSENSE STIMULANT LAXATIVE 8.6-50 MG tablet Take 1 tablet by mouth 2 (two) times daily. (Patient taking differently: 1 tablet at bedtime as needed. ) 60 tablet 3   levothyroxine (SYNTHROID, LEVOTHROID) 50 MCG tablet Take by mouth every other day.     levothyroxine (SYNTHROID, LEVOTHROID) 75 MCG tablet Take by mouth every other day.     LYRICA 50 MG capsule Take 1 capsule by mouth 2 (two) times daily.   5   omeprazole (PRILOSEC) 40 MG capsule Take 40 mg by mouth daily.  5   ondansetron (ZOFRAN) 4 MG tablet Take 4 mg by mouth every 6 (six) hours as needed.     predniSONE (DELTASONE) 5 MG tablet Take 5 mg by  mouth as needed.     XOLAIR 150 MG/ML prefilled syringe Inject 150 mg into the skin every 30 (thirty) days. 2 injections     zolpidem (AMBIEN) 10 MG tablet Take 10 mg by mouth at bedtime as needed. for sleep  0   lamoTRIgine (LAMICTAL) 100 MG tablet Take 1 tablet (100 mg total) by mouth daily. 30 tablet 0   nitroGLYCERIN (NITROSTAT) 0.4 MG SL tablet Place 1 tablet (0.4 mg total) under the tongue every 5 (five) minutes as needed for chest pain. 25 tablet 3   oxyCODONE-acetaminophen (PERCOCET) 5-325 MG tablet Take 1 tablet by mouth every 8 (eight)  hours as needed for severe pain. (Patient not taking: Reported on 08/17/2019) 20 tablet 0   ranitidine (ZANTAC) 150 MG tablet Take 300 mg by mouth at bedtime.  4   No facility-administered medications prior to visit.     PAST MEDICAL HISTORY: Past Medical History:  Diagnosis Date   Depression with anxiety    Fibromyalgia    G6PD deficiency    Hypertension    Kidney stone    Migraine    Seizure ([place])    Seizure disorder ([place]) 11/13/2017   Thyroid disease     PAST SURGICAL HISTORY: Past Surgical History:  Procedure Laterality Date   ABDOMINAL HYSTERECTOMY     APPENDECTOMY     CHOLECYSTECTOMY     ESOPHAGOGASTRODUODENOSCOPY  08/29/2017   Mild gastritis. [place] post empiric esophageal dilitation.    TOE SURGERY Left    removed toe nail   TONSILLECTOMY AND ADENOIDECTOMY      FAMILY HISTORY: Family History  Problem Relation Age of Onset   Hypertension Mother    Kidney cancer Mother    Heart attack Father        70   Seizures Father    Stroke Maternal Grandmother    Pancreatic cancer Maternal Grandfather    Prostate cancer Maternal Grandfather    Lung cancer Maternal Grandfather     SOCIAL HISTORY: Social History   Socioeconomic History   Marital status: Divorced    Spouse name: Not on file   Number of children: 3   Years of education: 14   Highest education level: Futures trader degree:  occupational, Hotel manager, or vocational program  Occupational History   Occupation: Disabled  Scientist, product/process development strain: Not on file   Food insecurity    Worry: Not on file    Inability: Not on Lexicographer needs    Medical: Not on file    Non-medical: Not on file  Tobacco Use   Smoking status: Never Smoker   Smokeless tobacco: Never Used  Substance and Sexual Activity   Alcohol use: No    Frequency: Never   Drug use: No   Sexual activity: Not on file  Lifestyle   Physical activity    Days per week: Not on file    Minutes per session: Not on file   Stress: Not on file  Relationships   Social connections    Talks on phone: Not on file    Gets together: Not on file    Attends religious service: Not on file    Active member of club or organization: Not on file    Attends meetings of clubs or organizations: Not on file    Relationship status: Not on file   Intimate partner violence    Fear of current or ex partner: Not on file    Emotionally abused: Not on file    Physically abused: Not on file    Forced sexual activity: Not on file  Other Topics Concern   Not on file  Social History Narrative   Lives at home with daughter, son and grandson.   Right-handed.   No caffeine use.    PHYSICAL EXAM  Vitals:   08/17/19 0856  BP: 118/76  Pulse: 66  Temp: 98.2 F (36.8 C)  TempSrc: Oral  Weight: 233 lb 9.6 oz (106 kg)  Height: 5\' 5"  (1.651 m)   Body mass index is 38.87 kg/m.  Generalized: Well developed, in no acute distress   Neurological examination  Mentation: Alert oriented to time, place, history taking. Follows all commands speech and language fluent Cranial nerve II-XII: Pupils were equal round reactive to light. Extraocular movements were full, visual field were full on confrontational test. Facial sensation and strength were normal. Head turning and shoulder shrug  were normal and symmetric. Motor: The motor testing  reveals 5 over 5 strength of all 4 extremities. Good symmetric motor tone is noted throughout.  Sensory: Sensory testing is intact to soft touch on all 4 extremities. No evidence of extinction is noted.  Coordination: Cerebellar testing reveals good finger-nose-finger and heel-to-shin bilaterally.  Gait and station: Gait is normal. Tandem gait is normal. Romberg is negative. No drift is seen.  Reflexes: Deep tendon reflexes are symmetric and normal bilaterally.   DIAGNOSTIC DATA (LABS, IMAGING, TESTING) - I reviewed patient records, labs, notes, testing and imaging myself where available.  Lab Results  Component Value Date   WBC 6.9 08/27/2018   HGB 12.7 08/27/2018   HCT 37.1 08/27/2018   MCV 95 08/27/2018   PLT 304 08/27/2018      Component Value Date/Time   NA 142 08/27/2018 0949   K 4.4 08/27/2018 0949   CL 103 08/27/2018 0949   CO2 24 08/27/2018 0949   GLUCOSE 108 (H) 08/27/2018 0949   GLUCOSE 97 06/02/2018 1608   BUN 11 08/27/2018 0949   CREATININE 0.79 08/27/2018 0949   CALCIUM 9.2 08/27/2018 0949   PROT 6.3 08/27/2018 0949   ALBUMIN 3.9 08/27/2018 0949   AST 16 08/27/2018 0949   ALT 22 08/27/2018 0949   ALKPHOS 81 08/27/2018 0949   BILITOT 0.3 08/27/2018 0949   GFRNONAA 90 08/27/2018 0949   GFRAA 104 08/27/2018 0949   No results found for: CHOL, HDL, LDLCALC, LDLDIRECT, TRIG, CHOLHDL No results found for: HGBA1C No results found for: VITAMINB12 No results found for: TSH  ASSESSMENT AND PLAN 48 y.o. year old female  has a past medical history of Depression with anxiety, Fibromyalgia, G6PD deficiency, Hypertension, Kidney stone, Migraine, Seizure ([place]), Seizure disorder ([place]) (11/13/2017), and Thyroid disease. here with:  1.  Seizures  She has continued to do well. She is tolerating Lamictal much better than Depakote. She has not had recurrent seizure since April 2020.  She remains on Lamictal 100 mg twice a day.  She will continue to go up on the medication by  taking 100 mg in the morning, 150 mg in the evening for 2 weeks, then increase to 150 mg twice a day.  I will check lab work today.  Based off her last seizure, she is not to operate a motor vehicle until October 2020.  It sounds as though her episodes of waking up at night shaking are more consistent with anxiety and panic attacks.  She will discuss with her primary care doctor.  She will let me know if these continue.  She will follow-up in 6 months or sooner if needed.  I did advise if her symptoms worsen or she develops any new symptoms she should let me know.  Her reported side effect of hair loss with Depakote, has started to improve by taking daily vitamin.  I spent 25 minutes with the patient. 50% of this time was spent discussing her plan of care.   Butler Denmark, AGNP-C, DNP 08/17/2019, 9:44 AM [facility] [address] [place], [place] [address] [phone]

## 2019-08-17 ENCOUNTER — Ambulatory Visit (INDEPENDENT_AMBULATORY_CARE_PROVIDER_SITE_OTHER): Payer: Medicare Other | Admitting: Neurology

## 2019-08-17 ENCOUNTER — Encounter: Payer: Self-pay | Admitting: Neurology

## 2019-08-17 ENCOUNTER — Other Ambulatory Visit: Payer: Self-pay

## 2019-08-17 VITALS — BP 118/76 | HR 66 | Temp 98.2°F | Ht 65.0 in | Wt 233.6 lb

## 2019-08-17 DIAGNOSIS — G40909 Epilepsy, unspecified, not intractable, without status epilepticus: Secondary | ICD-10-CM

## 2019-08-17 MED ORDER — LAMOTRIGINE 100 MG PO TABS: ORAL_TABLET | ORAL | 6 refills | Status: DC

## 2019-08-17 NOTE — Progress Notes (Signed)
I have read the note, and I agree with the clinical assessment and plan.  Kannon Baum K Talar Fraley   

## 2019-08-17 NOTE — Patient Instructions (Signed)
Take 100 mg in the morning, 150 mg in the evening x 2 weeks then take 150 mg twice daily

## 2019-08-18 LAB — CBC WITH DIFFERENTIAL/PLATELET
Basophils Absolute: 0 10*3/uL (ref 0.0–0.2)
Basos: 1 %
EOS (ABSOLUTE): 0.1 10*3/uL (ref 0.0–0.4)
Eos: 1 %
Hematocrit: 37.7 % (ref 34.0–46.6)
Hemoglobin: 12.6 g/dL (ref 11.1–15.9)
Immature Grans (Abs): 0 10*3/uL (ref 0.0–0.1)
Immature Granulocytes: 0 %
Lymphocytes Absolute: 2.3 10*3/uL (ref 0.7–3.1)
Lymphs: 34 %
MCH: 30.1 pg (ref 26.6–33.0)
MCHC: 33.4 g/dL (ref 31.5–35.7)
MCV: 90 fL (ref 79–97)
Monocytes Absolute: 0.5 10*3/uL (ref 0.1–0.9)
Monocytes: 7 %
Neutrophils Absolute: 3.9 10*3/uL (ref 1.4–7.0)
Neutrophils: 57 %
Platelets: 337 10*3/uL (ref 150–450)
RBC: 4.18 x10E6/uL (ref 3.77–5.28)
RDW: 11.5 % — ABNORMAL LOW (ref 11.7–15.4)
WBC: 6.8 10*3/uL (ref 3.4–10.8)

## 2019-08-18 LAB — COMPREHENSIVE METABOLIC PANEL
ALT: 42 IU/L — ABNORMAL HIGH (ref 0–32)
AST: 24 IU/L (ref 0–40)
Albumin/Globulin Ratio: 1.9 (ref 1.2–2.2)
Albumin: 4.3 g/dL (ref 3.8–4.8)
Alkaline Phosphatase: 103 IU/L (ref 39–117)
BUN/Creatinine Ratio: 12 (ref 9–23)
BUN: 11 mg/dL (ref 6–24)
Bilirubin Total: 0.3 mg/dL (ref 0.0–1.2)
CO2: 25 mmol/L (ref 20–29)
Calcium: 9.5 mg/dL (ref 8.7–10.2)
Chloride: 105 mmol/L (ref 96–106)
Creatinine, Ser: 0.93 mg/dL (ref 0.57–1.00)
GFR calc Af Amer: 85 mL/min/{1.73_m2} (ref >59)
GFR calc non Af Amer: 73 mL/min/{1.73_m2} (ref >59)
Globulin, Total: 2.3 g/dL (ref 1.5–4.5)
Glucose: 99 mg/dL (ref 65–99)
Potassium: 4.7 mmol/L (ref 3.5–5.2)
Sodium: 141 mmol/L (ref 134–144)
Total Protein: 6.6 g/dL (ref 6.0–8.5)

## 2019-08-18 LAB — LAMOTRIGINE LEVEL: Lamotrigine Lvl: 2 ug/mL (ref 2.0–20.0)

## 2019-08-19 ENCOUNTER — Telehealth: Payer: Self-pay | Admitting: *Deleted

## 2019-08-19 NOTE — Telephone Encounter (Signed)
-----   Message from Suzzanne Cloud, NP sent at 08/18/2019  4:41 PM EDT ----- Please call the patient, labs look okay with the exception of mildly elevated ALT 42, we will continue to monitor this overtime. Lamictal level is on the low end of normal. Please ensure she is actually taking the medication, she indicated she was. She was on track to continue increase up to 150 mg twice a day.

## 2019-08-19 NOTE — Telephone Encounter (Signed)
I called pt and relayed the lab results.  Elevated slightly ALT, will monitor over time.  Low normal of lamictal , continue to take and increase titration to 150mg  po bid.  She verbalized understanding.  Has appt in 01/2020 (6 mo RV).

## 2019-08-23 DIAGNOSIS — R404 Transient alteration of awareness: Secondary | ICD-10-CM | POA: Diagnosis not present

## 2019-08-23 DIAGNOSIS — R55 Syncope and collapse: Secondary | ICD-10-CM | POA: Diagnosis not present

## 2019-08-23 DIAGNOSIS — G4489 Other headache syndrome: Secondary | ICD-10-CM | POA: Diagnosis not present

## 2019-08-23 DIAGNOSIS — R52 Pain, unspecified: Secondary | ICD-10-CM | POA: Diagnosis not present

## 2019-08-23 DIAGNOSIS — R4781 Slurred speech: Secondary | ICD-10-CM | POA: Diagnosis not present

## 2019-08-23 DIAGNOSIS — E042 Nontoxic multinodular goiter: Secondary | ICD-10-CM | POA: Diagnosis not present

## 2019-08-23 DIAGNOSIS — S0990XA Unspecified injury of head, initial encounter: Secondary | ICD-10-CM | POA: Diagnosis not present

## 2019-08-23 DIAGNOSIS — R519 Headache, unspecified: Secondary | ICD-10-CM | POA: Diagnosis not present

## 2019-08-23 DIAGNOSIS — S199XXA Unspecified injury of neck, initial encounter: Secondary | ICD-10-CM | POA: Diagnosis not present

## 2019-08-24 ENCOUNTER — Telehealth: Payer: Self-pay | Admitting: Neurology

## 2019-08-24 DIAGNOSIS — H43813 Vitreous degeneration, bilateral: Secondary | ICD-10-CM | POA: Diagnosis not present

## 2019-08-24 DIAGNOSIS — H524 Presbyopia: Secondary | ICD-10-CM | POA: Diagnosis not present

## 2019-08-24 NOTE — Telephone Encounter (Signed)
Pt is asking for a call to discuss the side effects she feels she is having from the   amLODipine (NORVASC) 5 MG tablet   Pt is having headaches, pain in the back of her neck and she has passed out twice.  Pt is asking for a call to discuss

## 2019-08-24 NOTE — Telephone Encounter (Signed)
I called pt and LMVM for her that was returning call.  The medication norvasc listed is not prescribed by [place].  She may need to contact her pcp.  I left # to return call.

## 2019-08-25 DIAGNOSIS — E042 Nontoxic multinodular goiter: Secondary | ICD-10-CM | POA: Diagnosis not present

## 2019-08-25 DIAGNOSIS — G5620 Lesion of ulnar nerve, unspecified upper limb: Secondary | ICD-10-CM | POA: Diagnosis not present

## 2019-08-25 DIAGNOSIS — R55 Syncope and collapse: Secondary | ICD-10-CM | POA: Diagnosis not present

## 2019-08-25 DIAGNOSIS — M25511 Pain in right shoulder: Secondary | ICD-10-CM | POA: Diagnosis not present

## 2019-08-25 NOTE — Telephone Encounter (Signed)
I called pt and relayed that I was following up on her message left yesterday.  She is states that she feels that her head and neck pain may be related to the lamotrigine.  She states that she does not know when it started but is was after the larger doses.  I relayed that it does show headache as a SE.  She also was taken to [facility] ED for passing out at pharmacy, thrashing around, she does not think it was seizure.  She does not know what was cause, but feels like headache and neck pain was issue.  Had work up in ED, and she is to fax ED notes to [place].  She has appt today with pcp at 1430.  I relayed will let SS/NP know.

## 2019-08-25 NOTE — Telephone Encounter (Signed)
Yes, I will review her ED visit and please ask her what her primary doctor suggested from today's visit. I have not received any paperwork about ED visit yet.

## 2019-08-26 ENCOUNTER — Telehealth: Payer: Self-pay | Admitting: Neurology

## 2019-08-26 NOTE — Telephone Encounter (Signed)
I called the patient to discuss her concerns. I asked for her to contact us back if we can be of help. She did see her primary yesterday. I did not receive any paperwork from [facility].

## 2019-08-31 DIAGNOSIS — L501 Idiopathic urticaria: Secondary | ICD-10-CM | POA: Diagnosis not present

## 2019-08-31 DIAGNOSIS — D75A Glucose-6-phosphate dehydrogenase (G6PD) deficiency without anemia: Secondary | ICD-10-CM | POA: Diagnosis not present

## 2019-08-31 DIAGNOSIS — Z79899 Other long term (current) drug therapy: Secondary | ICD-10-CM | POA: Diagnosis not present

## 2019-08-31 DIAGNOSIS — R768 Other specified abnormal immunological findings in serum: Secondary | ICD-10-CM | POA: Diagnosis not present

## 2019-08-31 DIAGNOSIS — M2550 Pain in unspecified joint: Secondary | ICD-10-CM | POA: Diagnosis not present

## 2019-09-01 DIAGNOSIS — E042 Nontoxic multinodular goiter: Secondary | ICD-10-CM | POA: Diagnosis not present

## 2019-09-01 DIAGNOSIS — M25512 Pain in left shoulder: Secondary | ICD-10-CM | POA: Diagnosis not present

## 2019-09-01 DIAGNOSIS — R55 Syncope and collapse: Secondary | ICD-10-CM | POA: Diagnosis not present

## 2019-09-01 DIAGNOSIS — G5620 Lesion of ulnar nerve, unspecified upper limb: Secondary | ICD-10-CM | POA: Diagnosis not present

## 2019-09-09 DIAGNOSIS — G5620 Lesion of ulnar nerve, unspecified upper limb: Secondary | ICD-10-CM | POA: Diagnosis not present

## 2019-09-09 DIAGNOSIS — E042 Nontoxic multinodular goiter: Secondary | ICD-10-CM | POA: Diagnosis not present

## 2019-09-09 DIAGNOSIS — M25512 Pain in left shoulder: Secondary | ICD-10-CM | POA: Diagnosis not present

## 2019-09-09 DIAGNOSIS — R55 Syncope and collapse: Secondary | ICD-10-CM | POA: Diagnosis not present

## 2019-09-15 ENCOUNTER — Other Ambulatory Visit: Payer: Self-pay | Admitting: Gastroenterology

## 2019-09-17 DIAGNOSIS — G5620 Lesion of ulnar nerve, unspecified upper limb: Secondary | ICD-10-CM | POA: Diagnosis not present

## 2019-09-17 DIAGNOSIS — R55 Syncope and collapse: Secondary | ICD-10-CM | POA: Diagnosis not present

## 2019-09-17 DIAGNOSIS — M797 Fibromyalgia: Secondary | ICD-10-CM | POA: Diagnosis not present

## 2019-09-17 DIAGNOSIS — E042 Nontoxic multinodular goiter: Secondary | ICD-10-CM | POA: Diagnosis not present

## 2019-09-21 ENCOUNTER — Other Ambulatory Visit: Payer: Self-pay

## 2019-09-23 ENCOUNTER — Ambulatory Visit (INDEPENDENT_AMBULATORY_CARE_PROVIDER_SITE_OTHER): Payer: Medicare Other | Admitting: Endocrinology

## 2019-09-23 ENCOUNTER — Other Ambulatory Visit: Payer: Self-pay

## 2019-09-23 ENCOUNTER — Encounter: Payer: Self-pay | Admitting: Endocrinology

## 2019-09-23 VITALS — BP 126/82 | HR 121 | Ht 65.0 in | Wt 229.0 lb

## 2019-09-23 DIAGNOSIS — E042 Nontoxic multinodular goiter: Secondary | ICD-10-CM

## 2019-09-23 DIAGNOSIS — E039 Hypothyroidism, unspecified: Secondary | ICD-10-CM | POA: Diagnosis not present

## 2019-09-23 LAB — TSH: TSH: 0.23 u[IU]/mL — ABNORMAL LOW (ref 0.35–4.50)

## 2019-09-23 LAB — T4, FREE: Free T4: 0.87 ng/dL (ref 0.60–1.60)

## 2019-09-23 LAB — T3, FREE: T3, Free: 3.4 pg/mL (ref 2.3–4.2)

## 2019-09-23 NOTE — Progress Notes (Signed)
Patient ID: Kelly Haas, female   DOB: [birth year], 48 y.o.   MRN: [id number]           Reason for Appointment: Manage thyroid problems    History of Present Illness:   Patient has been referred by her PCP Dr. Truman Hayward  The patient's thyroid dysfunction was first discovered in 2002 after her pregnancy At that time not clear what her symptoms were but she was told that she needed to be on thyroid medication by an endocrinologist who saw her She did not think she felt any different with taking the thyroid supplement She was being followed for some time and her dose was adjusted finally to taking levothyroxine 75 alternating with 50 mcg  However at some point she stopped taking the supplement because she did not feel any different She will subsequently told by her PCP to start back on the supplements about 7 or 8 years ago because of abnormal blood test but she was not having any symptoms Again she did not feel any different with taking the thyroid supplement  Her TSH was mildly decreased in 5/20 and again in 08/2019 but her doses have not been changed  Current symptoms: She has had problems with weight fluctuation and recently weight gain She has periodic symptoms of feeling shaky but also is having issues with depression and insomnia She complains of fatigue. She tends to have heat intolerance mostly at night which is recent At times will feel her heart skipping but also on activity it may be beating fast  She has had difficulty with swallowing  Does not feel like she has any pressure or choking sensation in her neck  GOITER: While having a CT scan of her cervical spine she was noted to have multiple nodules measuring up to at least 1.8 cm She has not had any ultrasound done   No results found for: FREET4, TSH  TSH on 08/23/2019 was 0.31 with free T4 1.22 and free T3 3.96    Wt Readings from Last 3 Encounters:  09/23/19 229 lb (103.9 kg)  08/17/19 233 lb 9.6 oz (106 kg)   05/18/19 213 lb (96.6 kg)     Allergies as of 09/23/2019      Reactions   Bee Venom Anaphylaxis   Ibuprofen Hives, Nausea And Vomiting, Rash   Other reaction(s): Vomiting (intolerance)   Acetaminophen    Aminolevulinate Derivatives    Aspirin-acetaminophen-caffeine    Dapsone    Other reaction(s): Other (See Comments)   Dimercaprol    Other reaction(s): Other (See Comments)   Methylcellulose    Nitrofuran Derivatives    Phenazopyridine    Primaquine    Toluidine Blue    Latex Rash   Oxycodone Rash   Phenergan [promethazine Hcl] Rash      Medication List       Accurate as of September 23, 2019  2:21 PM. If you have any questions, ask your nurse or doctor.        amLODipine 5 MG tablet Commonly known as: NORVASC Take 5 mg by mouth daily.   aspirin EC 81 MG tablet Take 81 mg by mouth daily.   benzonatate 100 MG capsule Commonly known as: TESSALON Take by mouth 3 (three) times daily as needed for cough.   busPIRone 10 MG tablet Commonly known as: BUSPAR Take 10 mg by mouth 2 (two) times daily.   clonazePAM 0.5 MG tablet Commonly known as: KLONOPIN Take 0.5 mg by mouth 2 (two) times daily  as needed for anxiety.   cyclobenzaprine 10 MG tablet Commonly known as: FLEXERIL Take 1 tablet (10 mg total) by mouth 3 (three) times daily as needed for Muscle spasms.   dicyclomine 10 MG capsule Commonly known as: BENTYL Take 1 capsule (10 mg total) by mouth 2 (two) times daily.   EPINEPHrine 0.3 mg/0.3 mL Soaj injection Commonly known as: EPI-PEN Inject 0.3 mg into the muscle as needed.   folic acid 1 MG tablet Commonly known as: FOLVITE Take 1 mg by mouth daily.   GoodSense Stimulant Laxative 8.6-50 MG tablet Generic drug: senna-docusate Take 1 tablet by mouth 2 (two) times daily. What changed: See the new instructions.   lamoTRIgine 100 MG tablet Commonly known as: LaMICtal 100 mg in am, 150 mg pm x 2 weeks, then 150 mg twice daily   levothyroxine 75 MCG  tablet Commonly known as: SYNTHROID Take by mouth every other day.   levothyroxine 50 MCG tablet Commonly known as: SYNTHROID Take by mouth every other day.   Lyrica 50 MG capsule Generic drug: pregabalin Take 1 capsule by mouth 2 (two) times daily.   nitroGLYCERIN 0.4 MG SL tablet Commonly known as: NITROSTAT Place 1 tablet (0.4 mg total) under the tongue every 5 (five) minutes as needed for chest pain.   omeprazole 40 MG capsule Commonly known as: PRILOSEC Take 40 mg by mouth daily.   ondansetron 4 MG tablet Commonly known as: ZOFRAN Take 4 mg by mouth every 6 (six) hours as needed.   oxyCODONE-acetaminophen 5-325 MG tablet Commonly known as: Percocet Take 1 tablet by mouth every 8 (eight) hours as needed for severe pain.   predniSONE 5 MG tablet Commonly known as: DELTASONE Take 5 mg by mouth as needed.   ranitidine 150 MG tablet Commonly known as: ZANTAC Take 300 mg by mouth at bedtime.   Xolair 150 MG/ML prefilled syringe Generic drug: omalizumab Inject 150 mg into the skin every 30 (thirty) days. 2 injections   zolpidem 10 MG tablet Commonly known as: AMBIEN Take 10 mg by mouth at bedtime as needed. for sleep       Allergies:  Allergies  Allergen Reactions  . Bee Venom Anaphylaxis  . Ibuprofen Hives, Nausea And Vomiting and Rash    Other reaction(s): Vomiting (intolerance)  . Acetaminophen   . Aminolevulinate Derivatives   . Aspirin-Acetaminophen-Caffeine   . Dapsone     Other reaction(s): Other (See Comments)  . Dimercaprol     Other reaction(s): Other (See Comments)  . Methylcellulose   . Nitrofuran Derivatives   . Phenazopyridine   . Primaquine   . Toluidine Blue   . Latex Rash  . Oxycodone Rash  . Phenergan [Promethazine Hcl] Rash    Past Medical History:  Diagnosis Date  . Depression with anxiety   . Fibromyalgia   . G6PD deficiency   . Hypertension   . Kidney stone   . Migraine   . Seizure ([place])   . Seizure disorder ([place])  11/13/2017  . Thyroid disease     Past Surgical History:  Procedure Laterality Date  . ABDOMINAL HYSTERECTOMY    . APPENDECTOMY    . CHOLECYSTECTOMY    . ESOPHAGOGASTRODUODENOSCOPY  08/29/2017   Mild gastritis. Stauts post empiric esophageal dilitation.   . TOE SURGERY Left    removed toe nail  . TONSILLECTOMY AND ADENOIDECTOMY      Family History  Problem Relation Age of Onset  . Hypertension Mother   . Kidney cancer Mother   .  Heart attack Father        40  . Seizures Father   . Stroke Maternal Grandmother   . Pancreatic cancer Maternal Grandfather   . Prostate cancer Maternal Grandfather   . Lung cancer Maternal Grandfather     Social History:  reports that she has never smoked. She has never used smokeless tobacco. She reports that she does not drink alcohol or use drugs.   Review of Systems  Constitutional: Positive for weight gain.  HENT: Positive for headaches.   Eyes: Positive for blurred vision.  Respiratory: Positive for shortness of breath.   Cardiovascular: Positive for palpitations.  Gastrointestinal: Positive for nausea.       She has had acid reflux  Endocrine: Positive for fatigue and heat intolerance.  Genitourinary: Negative for frequency.  Musculoskeletal: Positive for muscle aches.       She is being treated for fibromyalgia by rheumatologist  Skin: Positive for rash.       She has hives periodically, on Xolair  Neurological: Positive for tremors.       Periodically gets shaky  Psychiatric/Behavioral: Positive for depressed mood and insomnia.       She has mood swings.  Also occasionally panic attacks      Examination:   BP 126/82 (BP Location: Left Arm, Patient Position: Sitting, Cuff Size: Large)   Pulse (!) 121   Ht 5\' 5"  (1.651 m)   Wt 229 lb (103.9 kg)   SpO2 97%   BMI 38.11 kg/m    General Appearance: pleasant, has generalized obesity No cushingoid features         Eyes: No prominence or swelling.    Oropharyngeal exam not  indicated        Neck: The thyroid is palpable on the right side about 1-1/2 times normal, soft to firm.  It is smooth and no nodules felt Left lobe not palpable There is no stridor. Pemberton sign is negative   There is no lymphadenopathy.     Cardiovascular: Normal  heart sounds, no murmur Respiratory:  Lungs clear Abdomen shows no hepatosplenomegaly  Neurological: REFLEXES: at biceps are normal. No tremor  Skin: no rash       Extremities: No edema  Assessment/Plan:  Multinodular goiter diagnosed recently on CT scan She has only a small relatively smooth soft goiter felt on the right side The largest nodule is about 1.8 cm on her CT scan Most likely this is hereditary because of her family history Discussed nature of multinodular goiter and likely she has had this for quite some time  To characterize her nodules she will have an ultrasound performed and further action as needed Discussed if she has any high-grade nodules she may need to have a needle aspiration biopsy and explained to her how this would be done  HYPOTHYROIDISM: She reportedly has had hypothyroidism although no details of this are available and not clear what her baseline TSH levels have been. She has been generally asymptomatic and does not feel any different with thyroid supplementation Still is taking relatively small dose of levothyroxine approximately 62.5 mcg daily  Since her TSH was slightly low and she is having nonspecific symptoms of palpitations, anxiety and heat intolerance may consider stopping the levothyroxine Also will check TPO antibodies to determine if she has autoimmune thyroid disease   A copy of the consultation note is sent to the referring physician  Elayne Snare 09/23/2019

## 2019-09-24 LAB — THYROID PEROXIDASE ANTIBODY: Thyroperoxidase Ab SerPl-aCnc: <9 IU/mL (ref 0–34)

## 2019-10-12 DIAGNOSIS — E042 Nontoxic multinodular goiter: Secondary | ICD-10-CM | POA: Diagnosis not present

## 2019-10-12 DIAGNOSIS — R55 Syncope and collapse: Secondary | ICD-10-CM | POA: Diagnosis not present

## 2019-10-12 DIAGNOSIS — G5620 Lesion of ulnar nerve, unspecified upper limb: Secondary | ICD-10-CM | POA: Diagnosis not present

## 2019-10-12 DIAGNOSIS — M797 Fibromyalgia: Secondary | ICD-10-CM | POA: Diagnosis not present

## 2019-10-13 ENCOUNTER — Ambulatory Visit
Admission: RE | Admit: 2019-10-13 | Discharge: 2019-10-13 | Disposition: A | Discharge status: Home / Self Care | Payer: Medicare Other | Source: Ambulatory Visit | Attending: Endocrinology | Admitting: Endocrinology

## 2019-10-13 DIAGNOSIS — E041 Nontoxic single thyroid nodule: Secondary | ICD-10-CM | POA: Diagnosis not present

## 2019-10-13 DIAGNOSIS — E042 Nontoxic multinodular goiter: Secondary | ICD-10-CM

## 2019-10-13 IMAGING — US US THYROID
1 series · 13 of 25 positions shown · non-contrast
Comparison: None.

CLINICAL DATA: Multinodular goiter seen on recent CT.

EXAM:
THYROID ULTRASOUND
TECHNIQUE: Ultrasound examination of the thyroid gland and adjacent soft
tissues was performed.

[Series 1: us thyroid · 0.07mm/px · 13 of 66 slices shown]
[im 1/66]
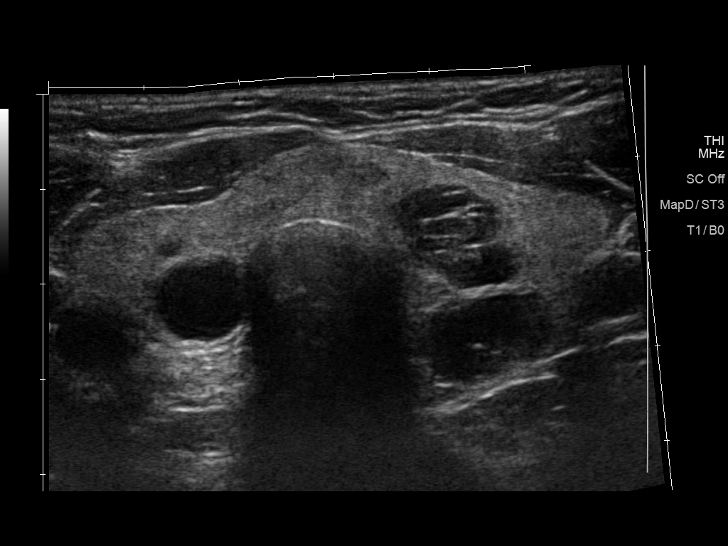
[im 6/66]
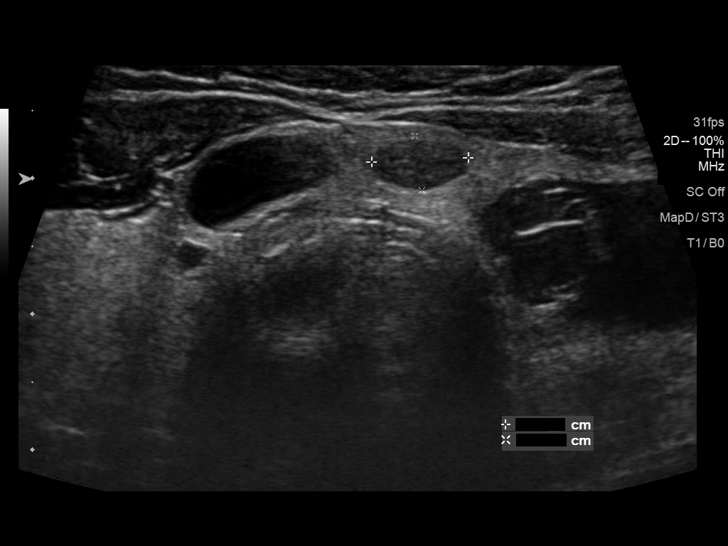
[im 11/66]
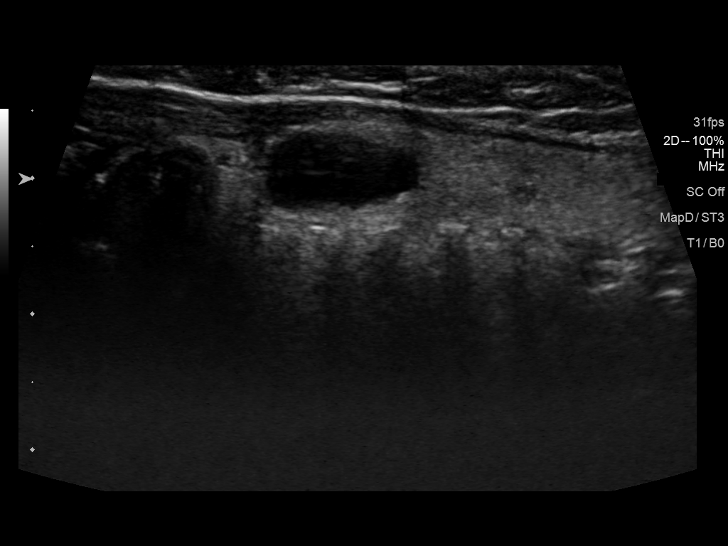
[im 17/66]
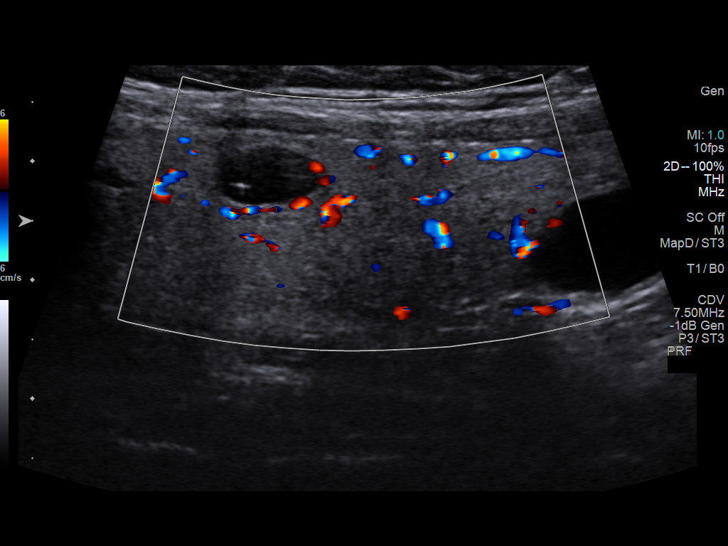
[im 22/66]
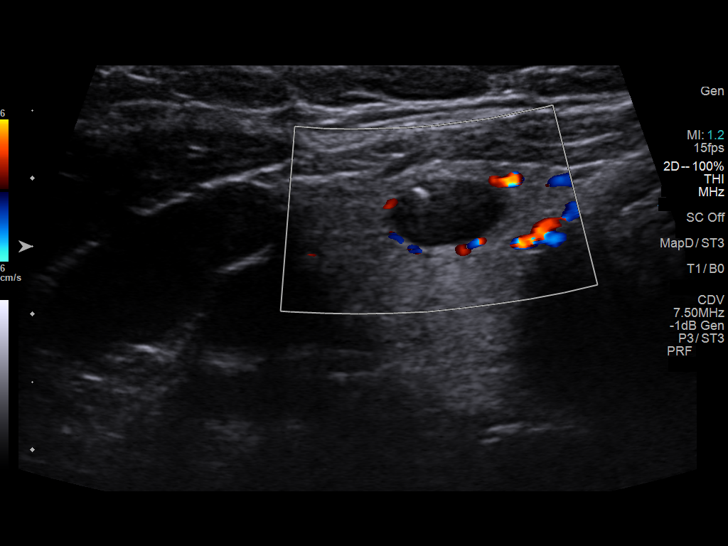
[im 28/66]
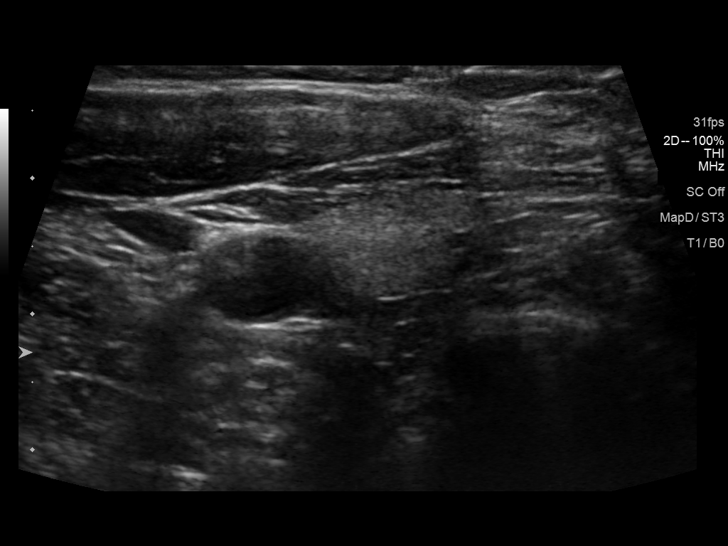
[im 33/66]
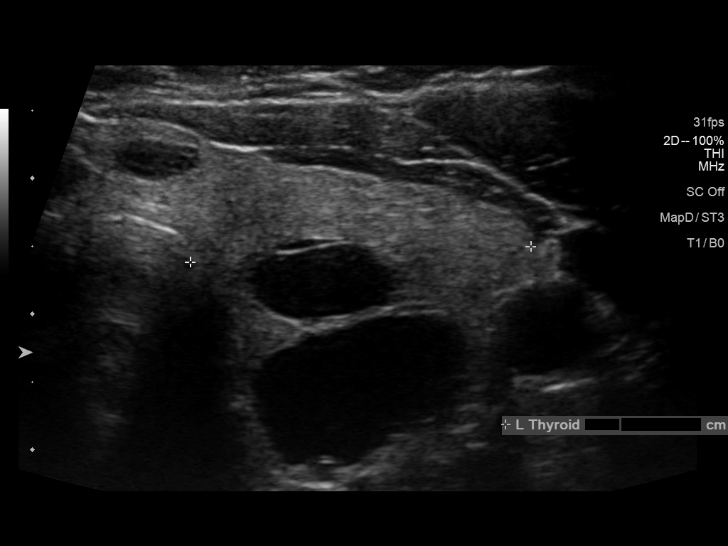
[im 38/66]
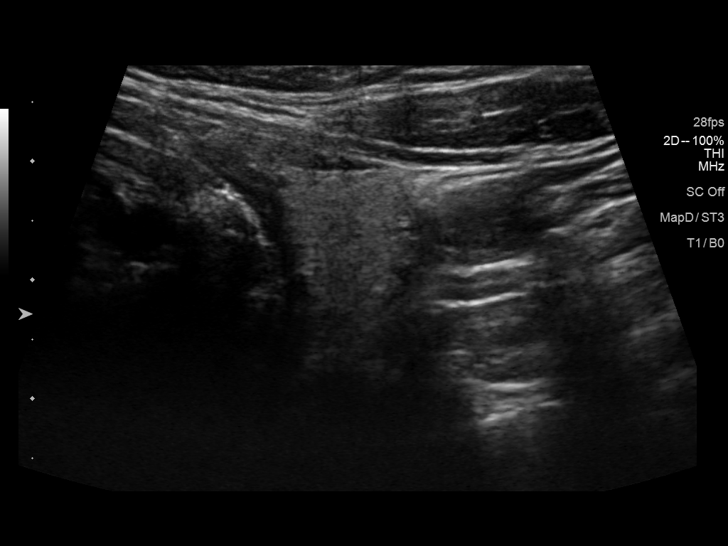
[im 44/66]
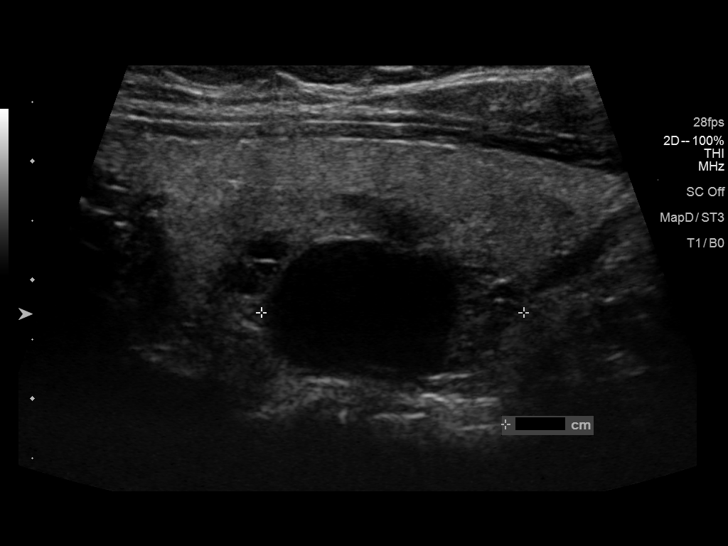
[im 49/66]
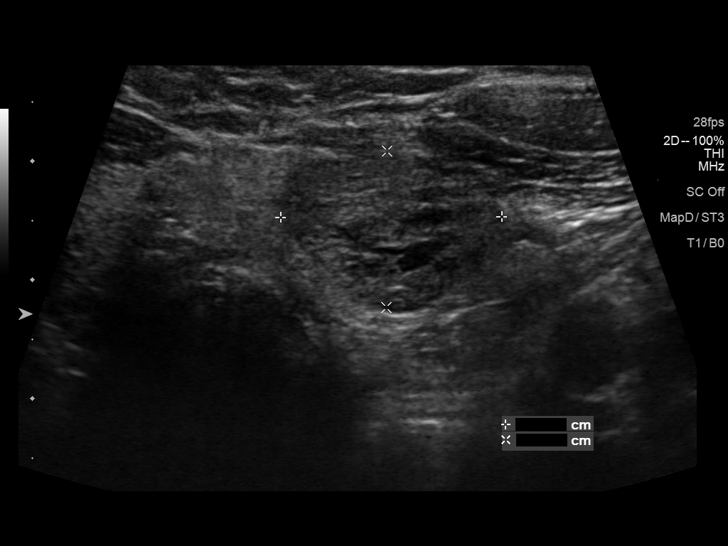
[im 55/66]
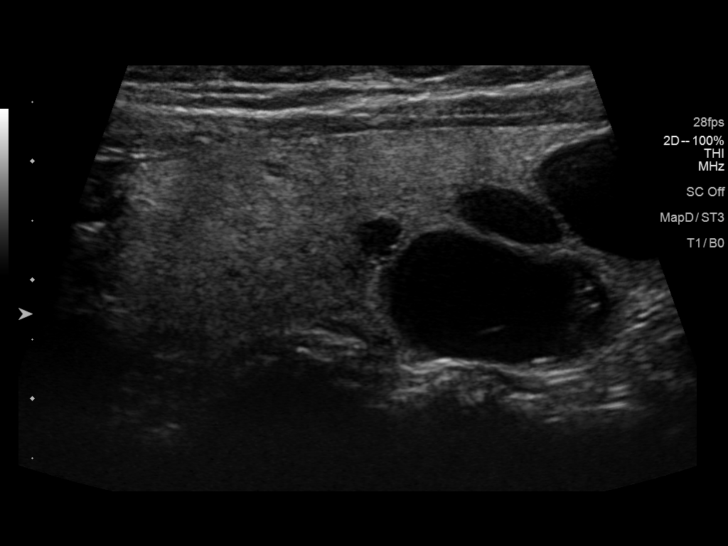
[im 60/66]
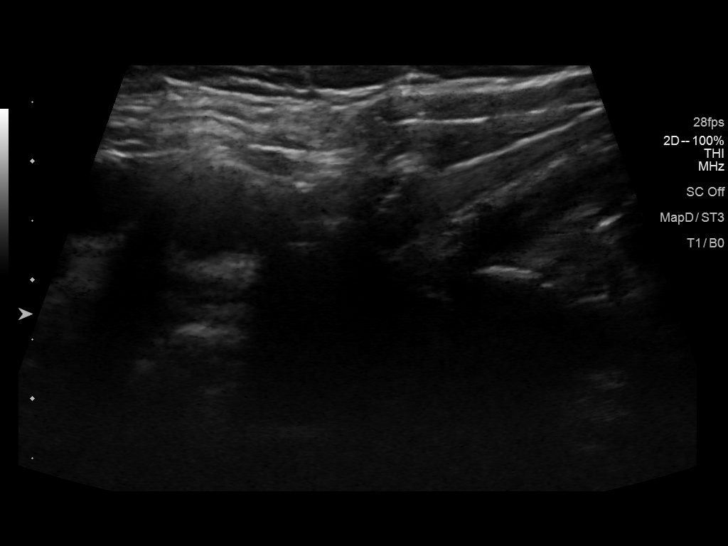
[im 66/66]
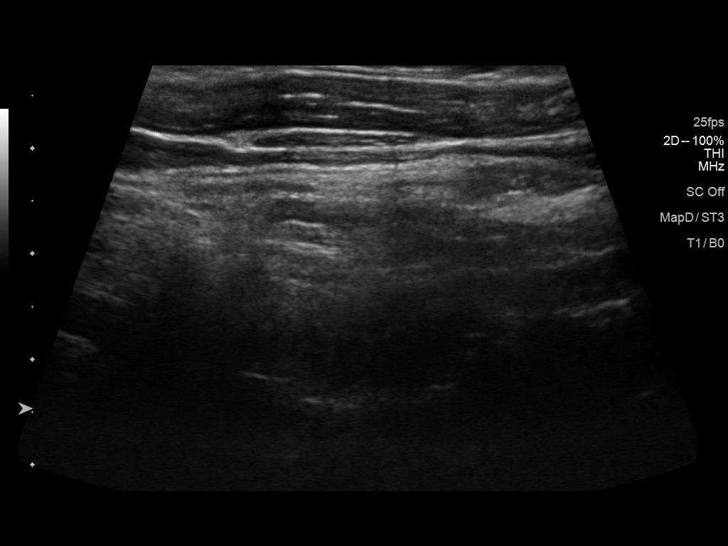

[13 of 25 positions shown; findings below may reference images not displayed]

FINDINGS: Parenchymal Echotexture: Normal

Isthmus: 0.6 cm

Right lobe: 6.5 x 1.7 x 2.4 cm

Left lobe: 6.6 x 2.4 x 2.5 cm

_________________________________________________________

Estimated total number of nodules >/= 1 cm: 6-10

Number of spongiform nodules >/=  2 cm not described below (TR1): 0

Number of mixed cystic and solid nodules >/= 1.5 cm not described
below (TR2): 0

_________________________________________________________

Nodule # 2:

Location: Isthmus; Mid

Maximum size: 0.9 cm cm; Other 2 dimensions: 0.7 x 0.4 cm cm

Composition: solid/almost completely solid (2)

Echogenicity: hypoechoic (2)

Shape: not taller-than-wide (0)

Margins: smooth (0)

Echogenic foci: none (0)

ACR TI-RADS total points: 4.

ACR TI-RADS risk category: TR4 (4-6 points).

ACR TI-RADS recommendations:

Given size (<0.9 cm) and appearance, this nodule does NOT meet
TI-RADS criteria for biopsy or dedicated follow-up.

_________________________________________________________

There are multiple additional nodules measuring up to approximately
2.2 cm that are cystic in appearance and therefore do not meet
criteria for follow-up or FNA. There is a 1.9 cm nodule in the left
inferior thyroid gland that demonstrates a spongiform appearance and
therefore does not require follow-up.
IMPRESSION: 1. Enlarged multinodular goiter as detailed above.
2. Bilateral thyroid nodules, the more [HOSPITAL] of which are cystic
in do not require FNA or follow-up.
3. 1.9 cm spongiform thyroid nodule in the left inferior thyroid
gland. This does not meet criteria for follow-up or FNA.
4. Solid 0.9 cm nodule in the isthmus that does not meet criteria
for follow-up or FNA.

The above is in keeping with the ACR TI-RADS recommendations - [HOSPITAL] [H3];[DATE].

## 2019-10-19 DIAGNOSIS — M797 Fibromyalgia: Secondary | ICD-10-CM | POA: Diagnosis not present

## 2019-10-19 DIAGNOSIS — R55 Syncope and collapse: Secondary | ICD-10-CM | POA: Diagnosis not present

## 2019-10-19 DIAGNOSIS — L501 Idiopathic urticaria: Secondary | ICD-10-CM | POA: Diagnosis not present

## 2019-10-19 DIAGNOSIS — G5620 Lesion of ulnar nerve, unspecified upper limb: Secondary | ICD-10-CM | POA: Diagnosis not present

## 2019-10-19 DIAGNOSIS — E042 Nontoxic multinodular goiter: Secondary | ICD-10-CM | POA: Diagnosis not present

## 2019-11-08 ENCOUNTER — Ambulatory Visit (INDEPENDENT_AMBULATORY_CARE_PROVIDER_SITE_OTHER): Payer: Medicare Other | Admitting: Endocrinology

## 2019-11-08 ENCOUNTER — Encounter: Payer: Self-pay | Admitting: Endocrinology

## 2019-11-08 ENCOUNTER — Other Ambulatory Visit: Payer: Self-pay

## 2019-11-08 VITALS — BP 142/82 | HR 111 | Ht 65.0 in | Wt 224.2 lb

## 2019-11-08 DIAGNOSIS — E042 Nontoxic multinodular goiter: Secondary | ICD-10-CM | POA: Diagnosis not present

## 2019-11-08 DIAGNOSIS — F419 Anxiety disorder, unspecified: Secondary | ICD-10-CM | POA: Diagnosis not present

## 2019-11-08 LAB — T4, FREE: Free T4: 0.98 ng/dL (ref 0.60–1.60)

## 2019-11-08 LAB — T3, FREE: T3, Free: 3.1 pg/mL (ref 2.3–4.2)

## 2019-11-08 LAB — TSH: TSH: 0.11 u[IU]/mL — ABNORMAL LOW (ref 0.35–4.50)

## 2019-11-08 NOTE — Progress Notes (Addendum)
Patient ID: Kelly Haas, female   DOB: Dec 29, 1970, 48 y.o.   MRN: [id number]           Chief complaint: Hoarseness and shakiness    History of Present Illness:   Baseline history on initial consultation as follows: The patient's thyroid dysfunction was first discovered in 2002 after her pregnancy At that time not clear what her symptoms were but she was told that she needed to be on thyroid medication by an endocrinologist who saw her She did not think she felt any different with taking the thyroid supplement She was being followed for some time and her dose was adjusted finally to taking levothyroxine 75 alternating with 50 mcg  However at some point she stopped taking the supplement because she did not feel any different She will subsequently told by her PCP to start back on the supplements about 7 or 8 years ago because of abnormal blood test but she was not having any symptoms Again she did not feel any different with taking the thyroid supplement  Her TSH was mildly decreased in 5/20 and again in 08/2019   Current symptoms:  She has continued symptoms of feeling shaky but also is having issues with depression and insomnia She complains of fatigue as before Also has had recurrent symptoms of palpitations on and off  She has hoarseness on and off but mostly in the last 2 weeks She has lost 5 pounds since her last visit  She had discussed her problems with her PCP and he indicated to her that her problems are related to the thyroid and she is here for follow-up  GOITER: While having a CT scan of her cervical spine she was noted to have multiple nodules measuring up to at least 1.8 cm  Ultrasound of the thyroid showed the following impression which has been copied from the report:  1. Enlarged multinodular goiter as detailed above. 2. Bilateral thyroid nodules, the majority of which are cystic and do not require FNA or follow-up. 3. 1.9 cm spongiform thyroid nodule in the  left inferior thyroid gland. This does not meet criteria for follow-up or FNA. 4. Solid 0.9 cm nodule in the isthmus that does not meet criteria for follow-up or FNA.  Thyroid enlargement present with size of each lobe of about 6.5 cm  Thyroid function studies:  Lab Results  Component Value Date   FREET4 0.87 09/23/2019   TSH 0.23 (L) 09/23/2019  Free T3 normal at 3.4  TSH on 08/23/2019 was 0.31 with free T4 1.22 and free T3 3.96    Allergies as of 11/08/2019      Reactions   Bee Venom Anaphylaxis   Ibuprofen Hives, Nausea And Vomiting, Rash   Other reaction(s): Vomiting (intolerance)   Acetaminophen    Aminolevulinate Derivatives    Aspirin-acetaminophen-caffeine    Dapsone    Other reaction(s): Other (See Comments)   Dimercaprol    Other reaction(s): Other (See Comments)   Methylcellulose    Nitrofuran Derivatives    Phenazopyridine    Primaquine    Toluidine Blue    Latex Rash   Oxycodone Rash   Phenergan [promethazine Hcl] Rash      Medication List       Accurate as of November 08, 2019  1:50 PM. If you have any questions, ask your nurse or doctor.        STOP taking these medications   levothyroxine 50 MCG tablet Commonly known as: SYNTHROID Stopped by: Elayne Snare,  MD   levothyroxine 75 MCG tablet Commonly known as: SYNTHROID Stopped by: Elayne Snare, MD     TAKE these medications   amLODipine 5 MG tablet Commonly known as: NORVASC Take 5 mg by mouth daily.   aspirin EC 81 MG tablet Take 81 mg by mouth daily.   benzonatate 100 MG capsule Commonly known as: TESSALON Take by mouth 3 (three) times daily as needed for cough.   busPIRone 10 MG tablet Commonly known as: BUSPAR Take 10 mg by mouth 2 (two) times daily.   clonazePAM 0.5 MG tablet Commonly known as: KLONOPIN Take 0.5 mg by mouth 2 (two) times daily as needed for anxiety.   cyclobenzaprine 10 MG tablet Commonly known as: FLEXERIL Take 1 tablet (10 mg total) by mouth 3 (three) times  daily as needed for Muscle spasms.   dicyclomine 10 MG capsule Commonly known as: BENTYL Take 1 capsule (10 mg total) by mouth 2 (two) times daily.   EPINEPHrine 0.3 mg/0.3 mL Soaj injection Commonly known as: EPI-PEN Inject 0.3 mg into the muscle as needed.   folic acid 1 MG tablet Commonly known as: FOLVITE Take 1 mg by mouth daily.   GoodSense Stimulant Laxative 8.6-50 MG tablet Generic drug: senna-docusate Take 1 tablet by mouth 2 (two) times daily. What changed: See the new instructions.   lamoTRIgine 100 MG tablet Commonly known as: LaMICtal 100 mg in am, 150 mg pm x 2 weeks, then 150 mg twice daily   Lyrica 50 MG capsule Generic drug: pregabalin Take 1 capsule by mouth 2 (two) times daily.   nitroGLYCERIN 0.4 MG SL tablet Commonly known as: NITROSTAT Place 1 tablet (0.4 mg total) under the tongue every 5 (five) minutes as needed for chest pain.   omeprazole 40 MG capsule Commonly known as: PRILOSEC Take 40 mg by mouth daily.   ondansetron 4 MG tablet Commonly known as: ZOFRAN Take 4 mg by mouth every 6 (six) hours as needed.   oxyCODONE-acetaminophen 5-325 MG tablet Commonly known as: Percocet Take 1 tablet by mouth every 8 (eight) hours as needed for severe pain.   predniSONE 5 MG tablet Commonly known as: DELTASONE Take 5 mg by mouth as needed.   ranitidine 150 MG tablet Commonly known as: ZANTAC Take 300 mg by mouth at bedtime.   Xolair 150 MG/ML prefilled syringe Generic drug: omalizumab Inject 150 mg into the skin every 30 (thirty) days. 2 injections   zolpidem 10 MG tablet Commonly known as: AMBIEN Take 10 mg by mouth at bedtime as needed. for sleep       Allergies:  Allergies  Allergen Reactions  . Bee Venom Anaphylaxis  . Ibuprofen Hives, Nausea And Vomiting and Rash    Other reaction(s): Vomiting (intolerance)  . Acetaminophen   . Aminolevulinate Derivatives   . Aspirin-Acetaminophen-Caffeine   . Dapsone     Other reaction(s):  Other (See Comments)  . Dimercaprol     Other reaction(s): Other (See Comments)  . Methylcellulose   . Nitrofuran Derivatives   . Phenazopyridine   . Primaquine   . Toluidine Blue   . Latex Rash  . Oxycodone Rash  . Phenergan [Promethazine Hcl] Rash    Past Medical History:  Diagnosis Date  . Depression with anxiety   . Fibromyalgia   . G6PD deficiency   . Hypertension   . Kidney stone   . Migraine   . Seizure ([place])   . Seizure disorder ([place]) 11/13/2017  . Thyroid disease  Past Surgical History:  Procedure Laterality Date  . ABDOMINAL HYSTERECTOMY    . APPENDECTOMY    . CHOLECYSTECTOMY    . ESOPHAGOGASTRODUODENOSCOPY  08/29/2017   Mild gastritis. Stauts post empiric esophageal dilitation.   . TOE SURGERY Left    removed toe nail  . TONSILLECTOMY AND ADENOIDECTOMY      Family History  Problem Relation Age of Onset  . Hypertension Mother   . Kidney cancer Mother   . Heart attack Father        56  . Seizures Father   . Stroke Maternal Grandmother   . Pancreatic cancer Maternal Grandfather   . Prostate cancer Maternal Grandfather   . Lung cancer Maternal Grandfather     Social History:  reports that she has never smoked. She has never used smokeless tobacco. She reports that she does not drink alcohol or use drugs.   Review of Systems  Constitutional: Positive for weight gain.  HENT: Positive for headaches.   Eyes: Positive for blurred vision.  Respiratory: Positive for shortness of breath.   Cardiovascular: Positive for palpitations.  Gastrointestinal: Positive for nausea.       She has had acid reflux  Endocrine: Positive for fatigue and heat intolerance.  Genitourinary: Negative for frequency.  Musculoskeletal: Positive for muscle aches.       She is being treated for fibromyalgia by rheumatologist  Skin: Positive for rash.       She has hives periodically, on Xolair  Neurological: Positive for tremors.       Periodically gets shaky    Psychiatric/Behavioral: Positive for depressed mood and insomnia.       She has mood swings.  Also occasionally panic attacks      Examination:   BP (!) 142/82 (BP Location: Left Arm, Patient Position: Sitting, Cuff Size: Normal)   Pulse (!) 111   Ht 5\' 5"  (1.651 m)   Wt 224 lb 3.2 oz (101.7 kg)   SpO2 96%   BMI 37.31 kg/m   She appears anxious  Her voice is hoarse   Eyes: No prominence or swelling.           Neck: The thyroid is palpable mostly on the right side about 1-1/2-2 times normal, soft to firm.  It is smooth and no nodules felt Left lobe not palpable  There is no lymphadenopathy.      Assessment/Plan:  Multinodular goiter with mostly small or cystic nodules not requiring further evaluation or follow-up as detailed in the radiologist report  On palpation her thyroid only appears mildly enlarged on the right side and left side not palpable With her thyroid being soft and diffusely enlarged without any unusually large nodules her symptoms of hoarseness are not related to the thyroid She also has a family history of multinodular goiter  Thyroid functions show minimal decrease in TSH and not in the hyperthyroid range T4 and T3 also normal She likely has a mildly autonomous thyroid without any degree of hyperthyroidism  Explained to her that her symptoms cannot be explained by thyroid function abnormalities and she does not have hyperthyroidism If her thyroid levels are clearly abnormal especially T4 and T3 levels she would be a candidate for treatment with I-[id number] but at this time does not need treatment  She needs to follow-up with her PCP and also ENT physician to discuss her various symptoms including hoarseness, shakiness and anxiety  Repeat thyroid panel will be done today and she can follow-up in 6 months Repeat  ultrasound will be needed in the future if her thyroid shows any unusual enlargement on exam or having any definitive pressure symptoms  Elayne Snare 11/08/2019  Addendum: Thyroid is still autonomous with normal T4 and T3 and subnormal TSH but not in hyperthyroid range Follow-up in 6 months as scheduled

## 2019-11-26 DIAGNOSIS — K219 Gastro-esophageal reflux disease without esophagitis: Secondary | ICD-10-CM | POA: Diagnosis not present

## 2019-11-26 DIAGNOSIS — E039 Hypothyroidism, unspecified: Secondary | ICD-10-CM | POA: Diagnosis not present

## 2019-11-26 DIAGNOSIS — R55 Syncope and collapse: Secondary | ICD-10-CM | POA: Diagnosis not present

## 2019-11-26 DIAGNOSIS — G5620 Lesion of ulnar nerve, unspecified upper limb: Secondary | ICD-10-CM | POA: Diagnosis not present

## 2019-11-26 DIAGNOSIS — M25562 Pain in left knee: Secondary | ICD-10-CM | POA: Diagnosis not present

## 2019-12-10 DIAGNOSIS — G5620 Lesion of ulnar nerve, unspecified upper limb: Secondary | ICD-10-CM | POA: Diagnosis not present

## 2019-12-10 DIAGNOSIS — E042 Nontoxic multinodular goiter: Secondary | ICD-10-CM | POA: Diagnosis not present

## 2019-12-10 DIAGNOSIS — Z20822 Contact with and (suspected) exposure to covid-19: Secondary | ICD-10-CM | POA: Diagnosis not present

## 2019-12-10 DIAGNOSIS — R55 Syncope and collapse: Secondary | ICD-10-CM | POA: Diagnosis not present

## 2019-12-14 DIAGNOSIS — R55 Syncope and collapse: Secondary | ICD-10-CM | POA: Diagnosis not present

## 2019-12-14 DIAGNOSIS — G5620 Lesion of ulnar nerve, unspecified upper limb: Secondary | ICD-10-CM | POA: Diagnosis not present

## 2019-12-14 DIAGNOSIS — L501 Idiopathic urticaria: Secondary | ICD-10-CM | POA: Diagnosis not present

## 2019-12-14 DIAGNOSIS — U071 COVID-19: Secondary | ICD-10-CM | POA: Diagnosis not present

## 2019-12-14 DIAGNOSIS — J208 Acute bronchitis due to other specified organisms: Secondary | ICD-10-CM | POA: Diagnosis not present

## 2019-12-22 DIAGNOSIS — R55 Syncope and collapse: Secondary | ICD-10-CM | POA: Diagnosis not present

## 2019-12-22 DIAGNOSIS — G5620 Lesion of ulnar nerve, unspecified upper limb: Secondary | ICD-10-CM | POA: Diagnosis not present

## 2019-12-22 DIAGNOSIS — U071 COVID-19: Secondary | ICD-10-CM | POA: Diagnosis not present

## 2019-12-22 DIAGNOSIS — J0100 Acute maxillary sinusitis, unspecified: Secondary | ICD-10-CM | POA: Diagnosis not present

## 2019-12-29 DIAGNOSIS — G5620 Lesion of ulnar nerve, unspecified upper limb: Secondary | ICD-10-CM | POA: Diagnosis not present

## 2019-12-29 DIAGNOSIS — R55 Syncope and collapse: Secondary | ICD-10-CM | POA: Diagnosis not present

## 2019-12-29 DIAGNOSIS — R519 Headache, unspecified: Secondary | ICD-10-CM | POA: Diagnosis not present

## 2019-12-29 DIAGNOSIS — M797 Fibromyalgia: Secondary | ICD-10-CM | POA: Diagnosis not present

## 2020-01-10 ENCOUNTER — Other Ambulatory Visit: Payer: Self-pay | Admitting: Gastroenterology

## 2020-01-12 DIAGNOSIS — G5620 Lesion of ulnar nerve, unspecified upper limb: Secondary | ICD-10-CM | POA: Diagnosis not present

## 2020-01-12 DIAGNOSIS — R519 Headache, unspecified: Secondary | ICD-10-CM | POA: Diagnosis not present

## 2020-01-12 DIAGNOSIS — R55 Syncope and collapse: Secondary | ICD-10-CM | POA: Diagnosis not present

## 2020-01-12 DIAGNOSIS — M797 Fibromyalgia: Secondary | ICD-10-CM | POA: Diagnosis not present

## 2020-01-18 DIAGNOSIS — R55 Syncope and collapse: Secondary | ICD-10-CM | POA: Diagnosis not present

## 2020-01-18 DIAGNOSIS — M797 Fibromyalgia: Secondary | ICD-10-CM | POA: Diagnosis not present

## 2020-01-18 DIAGNOSIS — T07XXXA Unspecified multiple injuries, initial encounter: Secondary | ICD-10-CM | POA: Diagnosis not present

## 2020-01-18 DIAGNOSIS — R519 Headache, unspecified: Secondary | ICD-10-CM | POA: Diagnosis not present

## 2020-02-03 DIAGNOSIS — G5620 Lesion of ulnar nerve, unspecified upper limb: Secondary | ICD-10-CM | POA: Diagnosis not present

## 2020-02-03 DIAGNOSIS — R55 Syncope and collapse: Secondary | ICD-10-CM | POA: Diagnosis not present

## 2020-02-03 DIAGNOSIS — M797 Fibromyalgia: Secondary | ICD-10-CM | POA: Diagnosis not present

## 2020-02-03 DIAGNOSIS — R519 Headache, unspecified: Secondary | ICD-10-CM | POA: Diagnosis not present

## 2020-02-06 ENCOUNTER — Other Ambulatory Visit: Payer: Self-pay | Admitting: Cardiology

## 2020-02-15 ENCOUNTER — Encounter: Payer: Self-pay | Admitting: Neurology

## 2020-02-15 ENCOUNTER — Ambulatory Visit: Payer: Medicare Other | Admitting: Neurology

## 2020-02-15 NOTE — Progress Notes (Deleted)
PATIENT: Kelly Haas DOB: 06/20/1971  REASON FOR VISIT: follow up HISTORY FROM: patient  HISTORY OF PRESENT ILLNESS: Today 02/15/20  Kelly Haas is a 49 year old female with history of seizures.  She is previously on Depakote, but was not taking the medication compliantly, felt it caused hair loss.  Her last seizure occurred in April 2020.  She has been on Lamictal since June 2020.  HISTORY  08/17/2019 SS: Kelly Haas is a 49 year old female with history of seizures.  In the past she was taking Depakote, but she is not taking the medication compliantly.  She felt the Depakote caused hair loss.  Her last seizure occurred in April 2020 was described as a generalized seizure.  After her last visit in June 2020, she was switched to Lamictal.  She is currently taking Lamictal 100 mg twice a day.  She indicates she is tolerating medication well.  She says she has not had recurrent seizure.  She does report she was in the hospital in July for panic attacks.  She is currently taking BuSpar and clonazepam for anxiety.  She reports she has been waking up in the middle of the night a few times a week, feeling like she is shaking.  She says this is not a seizure.  She says this feels like her anxiety or panic attacks.  During the episodes, she is alert, denies incontinence or oral injury.  She sees Dr. Truman Hayward in [facility] for her primary care doctor.  She presents today for follow-up unaccompanied.  REVIEW OF SYSTEMS: Out of a complete 14 system review of symptoms, the patient complains only of the following symptoms, and all other reviewed systems are negative.  ALLERGIES: Allergies  Allergen Reactions  . Bee Venom Anaphylaxis  . Ibuprofen Hives, Nausea And Vomiting and Rash    Other reaction(s): Vomiting (intolerance)  . Acetaminophen   . Aminolevulinate Derivatives   . Aspirin-Acetaminophen-Caffeine   . Dapsone     Other reaction(s): Other (See Comments)  . Dimercaprol     Other reaction(s): Other  (See Comments)  . Methylcellulose   . Nitrofuran Derivatives   . Phenazopyridine   . Primaquine   . Toluidine Blue   . Latex Rash  . Oxycodone Rash  . Phenergan [Promethazine Hcl] Rash    HOME MEDICATIONS: Outpatient Medications Prior to Visit  Medication Sig Dispense Refill  . amLODipine (NORVASC) 5 MG tablet Take 5 mg by mouth daily.     Marland Kitchen aspirin EC 81 MG tablet Take 81 mg by mouth daily.    . benzonatate (TESSALON) 100 MG capsule Take by mouth 3 (three) times daily as needed for cough.    . busPIRone (BUSPAR) 10 MG tablet Take 10 mg by mouth 2 (two) times daily.    . clonazePAM (KLONOPIN) 0.5 MG tablet Take 0.5 mg by mouth 2 (two) times daily as needed for anxiety.    . cyclobenzaprine (FLEXERIL) 10 MG tablet Take 1 tablet (10 mg total) by mouth 3 (three) times daily as needed for Muscle spasms.  3  . dicyclomine (BENTYL) 10 MG capsule Take 1 capsule (10 mg total) by mouth 2 (two) times daily. 90 capsule 0  . EPINEPHrine 0.3 mg/0.3 mL IJ SOAJ injection Inject 0.3 mg into the muscle as needed.     . folic acid (FOLVITE) 1 MG tablet Take 1 mg by mouth daily.  3  . GOODSENSE STIMULANT LAXATIVE 8.6-50 MG tablet Take 1 tablet by mouth 2 (two) times daily. (Patient taking  differently: 1 tablet at bedtime as needed. ) 60 tablet 3  . lamoTRIgine (LAMICTAL) 100 MG tablet 100 mg in am, 150 mg pm x 2 weeks, then 150 mg twice daily 90 tablet 6  . LYRICA 50 MG capsule Take 1 capsule by mouth 2 (two) times daily.   5  . nitroGLYCERIN (NITROSTAT) 0.4 MG SL tablet Place 1 tablet (0.4 mg total) under the tongue every 5 (five) minutes as needed for chest pain. 25 tablet 2  . omeprazole (PRILOSEC) 40 MG capsule Take 40 mg by mouth daily.  5  . ondansetron (ZOFRAN) 4 MG tablet Take 4 mg by mouth every 6 (six) hours as needed.    Marland Kitchen oxyCODONE-acetaminophen (PERCOCET) 5-325 MG tablet Take 1 tablet by mouth every 8 (eight) hours as needed for severe pain. 20 tablet 0  . predniSONE (DELTASONE) 5 MG tablet  Take 5 mg by mouth as needed.    . ranitidine (ZANTAC) 150 MG tablet Take 300 mg by mouth at bedtime.  4  . XOLAIR 150 MG/ML prefilled syringe Inject 150 mg into the skin every 30 (thirty) days. 2 injections    . zolpidem (AMBIEN) 10 MG tablet Take 10 mg by mouth at bedtime as needed. for sleep  0   No facility-administered medications prior to visit.    PAST MEDICAL HISTORY: Past Medical History:  Diagnosis Date  . Depression with anxiety   . Fibromyalgia   . G6PD deficiency   . Hypertension   . Kidney stone   . Migraine   . Seizure ([place])   . Seizure disorder ([place]) 11/13/2017  . Thyroid disease     PAST SURGICAL HISTORY: Past Surgical History:  Procedure Laterality Date  . ABDOMINAL HYSTERECTOMY    . APPENDECTOMY    . CHOLECYSTECTOMY    . ESOPHAGOGASTRODUODENOSCOPY  08/29/2017   Mild gastritis. Stauts post empiric esophageal dilitation.   . TOE SURGERY Left    removed toe nail  . TONSILLECTOMY AND ADENOIDECTOMY      FAMILY HISTORY: Family History  Problem Relation Age of Onset  . Hypertension Mother   . Kidney cancer Mother   . Heart attack Father        4  . Seizures Father   . Stroke Maternal Grandmother   . Pancreatic cancer Maternal Grandfather   . Prostate cancer Maternal Grandfather   . Lung cancer Maternal Grandfather     SOCIAL HISTORY: Social History   Socioeconomic History  . Marital status: Divorced    Spouse name: Not on file  . Number of children: 3  . Years of education: 52  . Highest education level: Associate degree: occupational, Hotel manager, or vocational program  Occupational History  . Occupation: Disabled  Tobacco Use  . Smoking status: Never Smoker  . Smokeless tobacco: Never Used  Substance and Sexual Activity  . Alcohol use: No  . Drug use: No  . Sexual activity: Not on file  Other Topics Concern  . Not on file  Social History Narrative   Lives at home with daughter, son and grandson.   Right-handed.   No caffeine use.    Social Determinants of Health   Financial Resource Strain:   . Difficulty of Paying Living Expenses:   Food Insecurity:   . Worried About Charity fundraiser in the Last Year:   . Arboriculturist in the Last Year:   Transportation Needs:   . Film/video editor (Medical):   Marland Kitchen Lack of Transportation (Non-Medical):  Physical Activity:   . Days of Exercise per Week:   . Minutes of Exercise per Session:   Stress:   . Feeling of Stress :   Social Connections:   . Frequency of Communication with Friends and Family:   . Frequency of Social Gatherings with Friends and Family:   . Attends Religious Services:   . Active Member of Clubs or Organizations:   . Attends Archivist Meetings:   Marland Kitchen Marital Status:   Intimate Partner Violence:   . Fear of Current or Ex-Partner:   . Emotionally Abused:   Marland Kitchen Physically Abused:   . Sexually Abused:       PHYSICAL EXAM  There were no vitals filed for this visit. There is no height or weight on file to calculate BMI.  Generalized: Well developed, in no acute distress   Neurological examination  Mentation: Alert oriented to time, place, history taking. Follows all commands speech and language fluent Cranial nerve II-XII: Pupils were equal round reactive to light. Extraocular movements were full, visual field were full on confrontational test. Facial sensation and strength were normal. Uvula tongue midline. Head turning and shoulder shrug  were normal and symmetric. Motor: The motor testing reveals 5 over 5 strength of all 4 extremities. Good symmetric motor tone is noted throughout.  Sensory: Sensory testing is intact to soft touch on all 4 extremities. No evidence of extinction is noted.  Coordination: Cerebellar testing reveals good finger-nose-finger and heel-to-shin bilaterally.  Gait and station: Gait is normal. Tandem gait is normal. Romberg is negative. No drift is seen.  Reflexes: Deep tendon reflexes are symmetric and  normal bilaterally.   DIAGNOSTIC DATA (LABS, IMAGING, TESTING) - I reviewed patient records, labs, notes, testing and imaging myself where available.  Lab Results  Component Value Date   WBC 6.8 08/17/2019   HGB 12.6 08/17/2019   HCT 37.7 08/17/2019   MCV 90 08/17/2019   PLT 337 08/17/2019      Component Value Date/Time   NA 141 08/17/2019 1008   K 4.7 08/17/2019 1008   CL 105 08/17/2019 1008   CO2 25 08/17/2019 1008   GLUCOSE 99 08/17/2019 1008   GLUCOSE 97 06/02/2018 1608   BUN 11 08/17/2019 1008   CREATININE 0.93 08/17/2019 1008   CALCIUM 9.5 08/17/2019 1008   PROT 6.6 08/17/2019 1008   ALBUMIN 4.3 08/17/2019 1008   AST 24 08/17/2019 1008   ALT 42 (H) 08/17/2019 1008   ALKPHOS 103 08/17/2019 1008   BILITOT 0.3 08/17/2019 1008   GFRNONAA 73 08/17/2019 1008   GFRAA 85 08/17/2019 1008   No results found for: CHOL, HDL, LDLCALC, LDLDIRECT, TRIG, CHOLHDL No results found for: HGBA1C No results found for: VITAMINB12 Lab Results  Component Value Date   TSH 0.11 (L) 11/08/2019      ASSESSMENT AND PLAN 49 y.o. year old female  has a past medical history of Depression with anxiety, Fibromyalgia, G6PD deficiency, Hypertension, Kidney stone, Migraine, Seizure ([place]), Seizure disorder ([place]) (11/13/2017), and Thyroid disease. here with ***   I spent 15 minutes with the patient. 50% of this time was spent   Butler Denmark, [place], [place] 02/15/2020, 5:37 AM [facility] [address] [place], [place] [address] [phone]

## 2020-03-06 DIAGNOSIS — M797 Fibromyalgia: Secondary | ICD-10-CM | POA: Diagnosis not present

## 2020-03-06 DIAGNOSIS — R55 Syncope and collapse: Secondary | ICD-10-CM | POA: Diagnosis not present

## 2020-03-06 DIAGNOSIS — R519 Headache, unspecified: Secondary | ICD-10-CM | POA: Diagnosis not present

## 2020-03-06 DIAGNOSIS — G5620 Lesion of ulnar nerve, unspecified upper limb: Secondary | ICD-10-CM | POA: Diagnosis not present

## 2020-03-13 DIAGNOSIS — L501 Idiopathic urticaria: Secondary | ICD-10-CM | POA: Diagnosis not present

## 2020-03-18 DIAGNOSIS — R55 Syncope and collapse: Secondary | ICD-10-CM | POA: Diagnosis not present

## 2020-03-18 DIAGNOSIS — M25551 Pain in right hip: Secondary | ICD-10-CM | POA: Diagnosis not present

## 2020-03-18 DIAGNOSIS — G5620 Lesion of ulnar nerve, unspecified upper limb: Secondary | ICD-10-CM | POA: Diagnosis not present

## 2020-03-18 DIAGNOSIS — G4733 Obstructive sleep apnea (adult) (pediatric): Secondary | ICD-10-CM | POA: Diagnosis not present

## 2020-03-24 ENCOUNTER — Other Ambulatory Visit: Payer: Self-pay | Admitting: Gastroenterology

## 2020-03-28 DIAGNOSIS — F419 Anxiety disorder, unspecified: Secondary | ICD-10-CM | POA: Diagnosis not present

## 2020-03-28 DIAGNOSIS — R531 Weakness: Secondary | ICD-10-CM | POA: Diagnosis not present

## 2020-03-28 DIAGNOSIS — R457 State of emotional shock and stress, unspecified: Secondary | ICD-10-CM | POA: Diagnosis not present

## 2020-03-28 DIAGNOSIS — R112 Nausea with vomiting, unspecified: Secondary | ICD-10-CM | POA: Diagnosis not present

## 2020-03-28 DIAGNOSIS — Z79899 Other long term (current) drug therapy: Secondary | ICD-10-CM | POA: Diagnosis not present

## 2020-03-28 DIAGNOSIS — R55 Syncope and collapse: Secondary | ICD-10-CM | POA: Diagnosis not present

## 2020-03-28 DIAGNOSIS — R4020 Unspecified coma: Secondary | ICD-10-CM | POA: Diagnosis not present

## 2020-03-28 DIAGNOSIS — R42 Dizziness and giddiness: Secondary | ICD-10-CM | POA: Diagnosis not present

## 2020-03-29 DIAGNOSIS — G5620 Lesion of ulnar nerve, unspecified upper limb: Secondary | ICD-10-CM | POA: Diagnosis not present

## 2020-03-29 DIAGNOSIS — R55 Syncope and collapse: Secondary | ICD-10-CM | POA: Diagnosis not present

## 2020-03-29 DIAGNOSIS — M797 Fibromyalgia: Secondary | ICD-10-CM | POA: Diagnosis not present

## 2020-03-29 DIAGNOSIS — R519 Headache, unspecified: Secondary | ICD-10-CM | POA: Diagnosis not present

## 2020-04-05 ENCOUNTER — Ambulatory Visit: Payer: Medicare Other | Admitting: Neurology

## 2020-04-05 ENCOUNTER — Encounter: Payer: Self-pay | Admitting: Neurology

## 2020-04-05 NOTE — Progress Notes (Deleted)
PATIENT: Kelly Haas DOB: November 28, 1970  REASON FOR VISIT: follow up HISTORY FROM: patient  HISTORY OF PRESENT ILLNESS: Today 04/05/20  [name] is a 49 year old female with history of seizures.  She has previously been on Depakote, is now on Lamictal.  Her last seizure occurred in April 2020.  HISTORY 08/17/2019 SS: [name] is a 49 year old female with history of seizures.  In the past she was taking Depakote, but she is not taking the medication compliantly.  She felt the Depakote caused hair loss.  Her last seizure occurred in April 2020 was described as a generalized seizure.  After her last visit in June 2020, she was switched to Lamictal.  She is currently taking Lamictal 100 mg twice a day.  She indicates she is tolerating medication well.  She says she has not had recurrent seizure.  She does report she was in the hospital in July for panic attacks.  She is currently taking BuSpar and clonazepam for anxiety.  She reports she has been waking up in the middle of the night a few times a week, feeling like she is shaking.  She says this is not a seizure.  She says this feels like her anxiety or panic attacks.  During the episodes, she is alert, denies incontinence or oral injury.  She sees Dr. Truman Hayward in [facility] for her primary care doctor.  She presents today for follow-up unaccompanied.    REVIEW OF SYSTEMS: Out of a complete 14 system review of symptoms, the patient complains only of the following symptoms, and all other reviewed systems are negative.  ALLERGIES: Allergies  Allergen Reactions  . Bee Venom Anaphylaxis  . Ibuprofen Hives, Nausea And Vomiting and Rash    Other reaction(s): Vomiting (intolerance)  . Acetaminophen   . Aminolevulinate Derivatives   . Aspirin-Acetaminophen-Caffeine   . Dapsone     Other reaction(s): Other (See Comments)  . Dimercaprol     Other reaction(s): Other (See Comments)  . Methylcellulose   . Nitrofuran Derivatives   . Phenazopyridine     . Primaquine   . Toluidine Blue   . Latex Rash  . Oxycodone Rash  . Phenergan [Promethazine Hcl] Rash    HOME MEDICATIONS: Outpatient Medications Prior to Visit  Medication Sig Dispense Refill  . amLODipine (NORVASC) 5 MG tablet Take 5 mg by mouth daily.     Marland Kitchen aspirin EC 81 MG tablet Take 81 mg by mouth daily.    . benzonatate (TESSALON) 100 MG capsule Take by mouth 3 (three) times daily as needed for cough.    . busPIRone (BUSPAR) 10 MG tablet Take 10 mg by mouth 2 (two) times daily.    . clonazePAM (KLONOPIN) 0.5 MG tablet Take 0.5 mg by mouth 2 (two) times daily as needed for anxiety.    . cyclobenzaprine (FLEXERIL) 10 MG tablet Take 1 tablet (10 mg total) by mouth 3 (three) times daily as needed for Muscle spasms.  3  . dicyclomine (BENTYL) 10 MG capsule Take 1 capsule (10 mg total) by mouth 2 (two) times daily. 90 capsule 0  . EPINEPHrine 0.3 mg/0.3 mL IJ SOAJ injection Inject 0.3 mg into the muscle as needed.     . folic acid (FOLVITE) 1 MG tablet Take 1 mg by mouth daily.  3  . GOODSENSE STIMULANT LAXATIVE 8.6-50 MG tablet Take 1 tablet by mouth 2 (two) times daily. (Patient taking differently: 1 tablet at bedtime as needed. ) 60 tablet 3  . lamoTRIgine (  LAMICTAL) 100 MG tablet 100 mg in am, 150 mg pm x 2 weeks, then 150 mg twice daily 90 tablet 6  . LYRICA 50 MG capsule Take 1 capsule by mouth 2 (two) times daily.   5  . nitroGLYCERIN (NITROSTAT) 0.4 MG SL tablet Place 1 tablet (0.4 mg total) under the tongue every 5 (five) minutes as needed for chest pain. 25 tablet 2  . omeprazole (PRILOSEC) 40 MG capsule Take 40 mg by mouth daily.  5  . ondansetron (ZOFRAN) 4 MG tablet Take 4 mg by mouth every 6 (six) hours as needed.    Marland Kitchen oxyCODONE-acetaminophen (PERCOCET) 5-325 MG tablet Take 1 tablet by mouth every 8 (eight) hours as needed for severe pain. 20 tablet 0  . predniSONE (DELTASONE) 5 MG tablet Take 5 mg by mouth as needed.    . ranitidine (ZANTAC) 150 MG tablet Take 300 mg by  mouth at bedtime.  4  . XOLAIR 150 MG/ML prefilled syringe Inject 150 mg into the skin every 30 (thirty) days. 2 injections    . zolpidem (AMBIEN) 10 MG tablet Take 10 mg by mouth at bedtime as needed. for sleep  0   No facility-administered medications prior to visit.    PAST MEDICAL HISTORY: Past Medical History:  Diagnosis Date  . Depression with anxiety   . Fibromyalgia   . G6PD deficiency   . Hypertension   . Kidney stone   . Migraine   . Seizure ([place])   . Seizure disorder ([place]) 11/13/2017  . Thyroid disease     PAST SURGICAL HISTORY: Past Surgical History:  Procedure Laterality Date  . ABDOMINAL HYSTERECTOMY    . APPENDECTOMY    . CHOLECYSTECTOMY    . ESOPHAGOGASTRODUODENOSCOPY  08/29/2017   Mild gastritis. Stauts post empiric esophageal dilitation.   . TOE SURGERY Left    removed toe nail  . TONSILLECTOMY AND ADENOIDECTOMY      FAMILY HISTORY: Family History  Problem Relation Age of Onset  . Hypertension Mother   . Kidney cancer Mother   . Heart attack Father        14  . Seizures Father   . Stroke Maternal Grandmother   . Pancreatic cancer Maternal Grandfather   . Prostate cancer Maternal Grandfather   . Lung cancer Maternal Grandfather     SOCIAL HISTORY: Social History   Socioeconomic History  . Marital status: Divorced    Spouse name: Not on file  . Number of children: 3  . Years of education: 27  . Highest education level: Associate degree: occupational, Hotel manager, or vocational program  Occupational History  . Occupation: Disabled  Tobacco Use  . Smoking status: Never Smoker  . Smokeless tobacco: Never Used  Substance and Sexual Activity  . Alcohol use: No  . Drug use: No  . Sexual activity: Not on file  Other Topics Concern  . Not on file  Social History Narrative   Lives at home with daughter, son and grandson.   Right-handed.   No caffeine use.   Social Determinants of Health   Financial Resource Strain:   . Difficulty of  Paying Living Expenses:   Food Insecurity:   . Worried About Charity fundraiser in the Last Year:   . Arboriculturist in the Last Year:   Transportation Needs:   . Film/video editor (Medical):   Marland Kitchen Lack of Transportation (Non-Medical):   Physical Activity:   . Days of Exercise per Week:   .  Minutes of Exercise per Session:   Stress:   . Feeling of Stress :   Social Connections:   . Frequency of Communication with Friends and Family:   . Frequency of Social Gatherings with Friends and Family:   . Attends Religious Services:   . Active Member of Clubs or Organizations:   . Attends Archivist Meetings:   Marland Kitchen Marital Status:   Intimate Partner Violence:   . Fear of Current or Ex-Partner:   . Emotionally Abused:   Marland Kitchen Physically Abused:   . Sexually Abused:       PHYSICAL EXAM  There were no vitals filed for this visit. There is no height or weight on file to calculate BMI.  Generalized: Well developed, in no acute distress   Neurological examination  Mentation: Alert oriented to time, place, history taking. Follows all commands speech and language fluent Cranial nerve II-XII: Pupils were equal round reactive to light. Extraocular movements were full, visual field were full on confrontational test. Facial sensation and strength were normal. Uvula tongue midline. Head turning and shoulder shrug  were normal and symmetric. Motor: The motor testing reveals 5 over 5 strength of all 4 extremities. Good symmetric motor tone is noted throughout.  Sensory: Sensory testing is intact to soft touch on all 4 extremities. No evidence of extinction is noted.  Coordination: Cerebellar testing reveals good finger-nose-finger and heel-to-shin bilaterally.  Gait and station: Gait is normal. Tandem gait is normal. Romberg is negative. No drift is seen.  Reflexes: Deep tendon reflexes are symmetric and normal bilaterally.   DIAGNOSTIC DATA (LABS, IMAGING, TESTING) - I reviewed patient  records, labs, notes, testing and imaging myself where available.  Lab Results  Component Value Date   WBC 6.8 08/17/2019   HGB 12.6 08/17/2019   HCT 37.7 08/17/2019   MCV 90 08/17/2019   PLT 337 08/17/2019      Component Value Date/Time   NA 141 08/17/2019 1008   K 4.7 08/17/2019 1008   CL 105 08/17/2019 1008   CO2 25 08/17/2019 1008   GLUCOSE 99 08/17/2019 1008   GLUCOSE 97 06/02/2018 1608   BUN 11 08/17/2019 1008   CREATININE 0.93 08/17/2019 1008   CALCIUM 9.5 08/17/2019 1008   PROT 6.6 08/17/2019 1008   ALBUMIN 4.3 08/17/2019 1008   AST 24 08/17/2019 1008   ALT 42 (H) 08/17/2019 1008   ALKPHOS 103 08/17/2019 1008   BILITOT 0.3 08/17/2019 1008   GFRNONAA 73 08/17/2019 1008   GFRAA 85 08/17/2019 1008   No results found for: CHOL, HDL, LDLCALC, LDLDIRECT, TRIG, CHOLHDL No results found for: HGBA1C No results found for: VITAMINB12 Lab Results  Component Value Date   TSH 0.11 (L) 11/08/2019      ASSESSMENT AND PLAN 49 y.o. year old female  has a past medical history of Depression with anxiety, Fibromyalgia, G6PD deficiency, Hypertension, Kidney stone, Migraine, Seizure ([place]), Seizure disorder ([place]) (11/13/2017), and Thyroid disease. here with ***   I spent 15 minutes with the patient. 50% of this time was spent   Butler Denmark, [place], DNP 04/05/2020, 5:48 AM [facility] [address], [place], [place] [address] [phone]

## 2020-04-09 ENCOUNTER — Encounter: Payer: Self-pay | Admitting: Neurology

## 2020-04-18 DIAGNOSIS — E669 Obesity, unspecified: Secondary | ICD-10-CM | POA: Diagnosis not present

## 2020-04-18 DIAGNOSIS — Z0000 Encounter for general adult medical examination without abnormal findings: Secondary | ICD-10-CM | POA: Diagnosis not present

## 2020-04-18 DIAGNOSIS — Z1331 Encounter for screening for depression: Secondary | ICD-10-CM | POA: Diagnosis not present

## 2020-04-18 DIAGNOSIS — Z9181 History of falling: Secondary | ICD-10-CM | POA: Diagnosis not present

## 2020-04-18 DIAGNOSIS — E785 Hyperlipidemia, unspecified: Secondary | ICD-10-CM | POA: Diagnosis not present

## 2020-05-03 DIAGNOSIS — B9689 Other specified bacterial agents as the cause of diseases classified elsewhere: Secondary | ICD-10-CM | POA: Diagnosis not present

## 2020-05-03 DIAGNOSIS — R55 Syncope and collapse: Secondary | ICD-10-CM | POA: Diagnosis not present

## 2020-05-03 DIAGNOSIS — M25552 Pain in left hip: Secondary | ICD-10-CM | POA: Diagnosis not present

## 2020-05-03 DIAGNOSIS — J028 Acute pharyngitis due to other specified organisms: Secondary | ICD-10-CM | POA: Diagnosis not present

## 2020-05-04 ENCOUNTER — Other Ambulatory Visit: Payer: Self-pay | Admitting: *Deleted

## 2020-05-04 MED ORDER — LAMOTRIGINE 100 MG PO TABS: 150.0000 mg | ORAL_TABLET | Freq: Two times a day (BID) | ORAL | 6 refills | Status: DC

## 2020-05-07 NOTE — Progress Notes (Signed)
Patient ID: Kelly Haas, female   DOB: June 05, 1971, 49 y.o.   MRN: [id number]           Chief complaint: Follow-up of thyroid    History of Present Illness:   Baseline history on initial consultation as follows: The patient's thyroid dysfunction was first discovered in 2002 after her pregnancy At that time not clear what her symptoms were but she was told that she needed to be on thyroid medication by an endocrinologist who saw her She did not think she felt any different with taking the thyroid supplement She was being followed for some time and her dose was adjusted finally to taking levothyroxine 75 alternating with 50 mcg  However at some point she stopped taking the supplement because she did not feel any different She will subsequently told by her PCP to start back on the supplements about 7 or 8 years ago because of abnormal blood test but she was not having any symptoms Again she did not feel any different with taking the thyroid supplement  Her TSH was mildly decreased in 5/20 and again in 08/2019   Current symptoms:  She has continued symptoms of fatigue At times she feels either her heart racing or skipping Also having continued insomnia She has lost weight since her last visit in 12/20  She has hoarseness on and off but has not seen ENT specialist Thyroid levels indicated below  GOITER: While having a CT scan of her cervical spine she was noted to have multiple nodules measuring up to at least 1.8 cm  Ultrasound of the thyroid showed the following impression which has been copied from the report:  1. Enlarged multinodular goiter as detailed above. 2. Bilateral thyroid nodules, the majority of which are cystic and do not require FNA or follow-up. 3. 1.9 cm spongiform thyroid nodule in the left inferior thyroid gland. This does not meet criteria for follow-up or FNA. 4. Solid 0.9 cm nodule in the isthmus that does not meet criteria for follow-up or FNA.  Thyroid  enlargement present with size of each lobe of about 6.5 cm  Wt Readings from Last 3 Encounters:  05/08/20 215 lb 9.6 oz (97.8 kg)  11/08/19 224 lb 3.2 oz (101.7 kg)  09/23/19 229 lb (103.9 kg)    Thyroid function studies:  Lab Results  Component Value Date   FREET4 0.98 11/08/2019   FREET4 0.87 09/23/2019   TSH 0.11 (L) 11/08/2019   TSH 0.23 (L) 09/23/2019   Lab Results  Component Value Date   T3FREE 3.1 11/08/2019   T3FREE 3.4 09/23/2019    TSH on 08/23/2019 was 0.31 with free T4 1.22 and free T3 3.96    Allergies as of 05/08/2020      Reactions   Bee Venom Anaphylaxis   Ibuprofen Hives, Nausea And Vomiting, Rash   Other reaction(s): Vomiting (intolerance)   Acetaminophen    Aminolevulinate Derivatives    Aspirin-acetaminophen-caffeine    Dapsone    Other reaction(s): Other (See Comments)   Dimercaprol    Other reaction(s): Other (See Comments)   Methylcellulose    Nitrofuran Derivatives    Phenazopyridine    Primaquine    Toluidine Blue    Latex Rash   Oxycodone Rash   Phenergan [promethazine Hcl] Rash      Medication List       Accurate as of May 07, 2020  9:18 PM. If you have any questions, ask your nurse or doctor.  amLODipine 5 MG tablet Commonly known as: NORVASC Take 5 mg by mouth daily.   aspirin EC 81 MG tablet Take 81 mg by mouth daily.   benzonatate 100 MG capsule Commonly known as: TESSALON Take by mouth 3 (three) times daily as needed for cough.   busPIRone 10 MG tablet Commonly known as: BUSPAR Take 10 mg by mouth 2 (two) times daily.   clonazePAM 0.5 MG tablet Commonly known as: KLONOPIN Take 0.5 mg by mouth 2 (two) times daily as needed for anxiety.   cyclobenzaprine 10 MG tablet Commonly known as: FLEXERIL Take 1 tablet (10 mg total) by mouth 3 (three) times daily as needed for Muscle spasms.   dicyclomine 10 MG capsule Commonly known as: BENTYL Take 1 capsule (10 mg total) by mouth 2 (two) times daily.    EPINEPHrine 0.3 mg/0.3 mL Soaj injection Commonly known as: EPI-PEN Inject 0.3 mg into the muscle as needed.   folic acid 1 MG tablet Commonly known as: FOLVITE Take 1 mg by mouth daily.   GoodSense Stimulant Laxative 8.6-50 MG tablet Generic drug: senna-docusate Take 1 tablet by mouth 2 (two) times daily. What changed: See the new instructions.   lamoTRIgine 100 MG tablet Commonly known as: LaMICtal Take 1.5 tablets (150 mg total) by mouth 2 (two) times daily.   Lyrica 50 MG capsule Generic drug: pregabalin Take 1 capsule by mouth 2 (two) times daily.   nitroGLYCERIN 0.4 MG SL tablet Commonly known as: NITROSTAT Place 1 tablet (0.4 mg total) under the tongue every 5 (five) minutes as needed for chest pain.   omeprazole 40 MG capsule Commonly known as: PRILOSEC Take 40 mg by mouth daily.   ondansetron 4 MG tablet Commonly known as: ZOFRAN Take 4 mg by mouth every 6 (six) hours as needed.   oxyCODONE-acetaminophen 5-325 MG tablet Commonly known as: Percocet Take 1 tablet by mouth every 8 (eight) hours as needed for severe pain.   predniSONE 5 MG tablet Commonly known as: DELTASONE Take 5 mg by mouth as needed.   ranitidine 150 MG tablet Commonly known as: ZANTAC Take 300 mg by mouth at bedtime.   Xolair 150 MG/ML prefilled syringe Generic drug: omalizumab Inject 150 mg into the skin every 30 (thirty) days. 2 injections   zolpidem 10 MG tablet Commonly known as: AMBIEN Take 10 mg by mouth at bedtime as needed. for sleep       Allergies:  Allergies  Allergen Reactions  . Bee Venom Anaphylaxis  . Ibuprofen Hives, Nausea And Vomiting and Rash    Other reaction(s): Vomiting (intolerance)  . Acetaminophen   . Aminolevulinate Derivatives   . Aspirin-Acetaminophen-Caffeine   . Dapsone     Other reaction(s): Other (See Comments)  . Dimercaprol     Other reaction(s): Other (See Comments)  . Methylcellulose   . Nitrofuran Derivatives   . Phenazopyridine    . Primaquine   . Toluidine Blue   . Latex Rash  . Oxycodone Rash  . Phenergan [Promethazine Hcl] Rash    Past Medical History:  Diagnosis Date  . Depression with anxiety   . Fibromyalgia   . G6PD deficiency   . Hypertension   . Kidney stone   . Migraine   . Seizure ([place])   . Seizure disorder ([place]) 11/13/2017  . Thyroid disease     Past Surgical History:  Procedure Laterality Date  . ABDOMINAL HYSTERECTOMY    . APPENDECTOMY    . CHOLECYSTECTOMY    . ESOPHAGOGASTRODUODENOSCOPY  08/29/2017   Mild gastritis. Stauts post empiric esophageal dilitation.   . TOE SURGERY Left    removed toe nail  . TONSILLECTOMY AND ADENOIDECTOMY      Family History  Problem Relation Age of Onset  . Hypertension Mother   . Kidney cancer Mother   . Heart attack Father        19  . Seizures Father   . Stroke Maternal Grandmother   . Pancreatic cancer Maternal Grandfather   . Prostate cancer Maternal Grandfather   . Lung cancer Maternal Grandfather     Social History:  reports that she has never smoked. She has never used smokeless tobacco. She reports that she does not drink alcohol and does not use drugs.   Review of Systems  Cardiovascular: Positive for palpitations.  Endocrine: Positive for fatigue and heat intolerance.  Psychiatric/Behavioral: Positive for insomnia.   She is reluctant to take the Covid vaccine   Examination:   There were no vitals taken for this visit.     THYROID is palpable on the right side about 1-1/2-2 times normal, soft to firm distinct nodule felt Left lobe and isthmus not palpable  No lymphadenopathy in the neck Biceps reflexes are slightly brisk No tremor present   Assessment/Plan:  Multinodular goiter with mostly small or cystic nodules not requiring further evaluation or follow-up as detailed in the radiologist report in 11/20  Thyroid exam shows it to be mildly enlarged on the right side and left side not palpable No new nodules  felt  She also has a family history of multinodular goiter She thinks that her sister has thyroid cancer  Thyroid functions have been previously normal although TSH may be suppressed at times  She likely has an autonomous thyroid without any amount of hyperthyroidism  Labs to be checked today We can recheck her thyroid ultrasound at the end of the year  She needs to follow-up with her PCP and also ENT physician to discuss her recurrent hoarseness as well as likely perimenopausal symptoms  Given patient education information about Covid vaccine and recommended that this would be important for her to take  Again she can follow-up in 6 months   Sarina Robleto Dwyane Dee 05/07/2020

## 2020-05-08 ENCOUNTER — Encounter: Payer: Self-pay | Admitting: Endocrinology

## 2020-05-08 ENCOUNTER — Ambulatory Visit (INDEPENDENT_AMBULATORY_CARE_PROVIDER_SITE_OTHER): Payer: Medicare Other | Admitting: Endocrinology

## 2020-05-08 ENCOUNTER — Encounter: Payer: Self-pay | Admitting: Neurology

## 2020-05-08 ENCOUNTER — Other Ambulatory Visit: Payer: Self-pay

## 2020-05-08 VITALS — BP 130/80 | HR 108 | Ht 65.0 in | Wt 215.6 lb

## 2020-05-08 DIAGNOSIS — R7989 Other specified abnormal findings of blood chemistry: Secondary | ICD-10-CM

## 2020-05-08 DIAGNOSIS — R490 Dysphonia: Secondary | ICD-10-CM | POA: Diagnosis not present

## 2020-05-08 DIAGNOSIS — E042 Nontoxic multinodular goiter: Secondary | ICD-10-CM | POA: Diagnosis not present

## 2020-05-08 LAB — TSH: TSH: 0.32 u[IU]/mL — ABNORMAL LOW (ref 0.35–4.50)

## 2020-05-08 LAB — T3, FREE: T3, Free: 3.8 pg/mL (ref 2.3–4.2)

## 2020-05-08 LAB — T4, FREE: Free T4: 1.3 ng/dL (ref 0.60–1.60)

## 2020-05-08 NOTE — Progress Notes (Signed)
Please call to let patient know that the thyroid  results are normal and no further action needed

## 2020-05-09 ENCOUNTER — Telehealth: Payer: Self-pay | Admitting: *Deleted

## 2020-05-09 NOTE — Telephone Encounter (Signed)
I called pt and LMVM for her that appt available with SS/NP is 05-11-20 at 2:15pm,  Held but to let us know ASAP, via [employer] or phone.

## 2020-05-25 DIAGNOSIS — G4733 Obstructive sleep apnea (adult) (pediatric): Secondary | ICD-10-CM | POA: Diagnosis not present

## 2020-06-14 ENCOUNTER — Other Ambulatory Visit: Payer: Self-pay | Admitting: Gastroenterology

## 2020-06-14 DIAGNOSIS — L501 Idiopathic urticaria: Secondary | ICD-10-CM | POA: Diagnosis not present

## 2020-06-20 ENCOUNTER — Encounter: Payer: Self-pay | Admitting: Gastroenterology

## 2020-06-20 ENCOUNTER — Ambulatory Visit (INDEPENDENT_AMBULATORY_CARE_PROVIDER_SITE_OTHER): Payer: Medicare Other | Admitting: Gastroenterology

## 2020-06-20 VITALS — BP 128/84 | HR 103 | Ht 66.0 in | Wt 219.5 lb

## 2020-06-20 DIAGNOSIS — R109 Unspecified abdominal pain: Secondary | ICD-10-CM | POA: Diagnosis not present

## 2020-06-20 DIAGNOSIS — R112 Nausea with vomiting, unspecified: Secondary | ICD-10-CM

## 2020-06-20 MED ORDER — DICYCLOMINE HCL 10 MG PO CAPS: 10.0000 mg | ORAL_CAPSULE | Freq: Two times a day (BID) | ORAL | 6 refills | Status: DC

## 2020-06-20 NOTE — Patient Instructions (Addendum)
If you are age 48 or older, your body mass index should be between 23-30. Your Body mass index is 35.43 kg/m. If this is out of the aforementioned range listed, please consider follow up with your Primary Care Provider.  If you are age 50 or younger, your body mass index should be between 19-25. Your Body mass index is 35.43 kg/m. If this is out of the aformentioned range listed, please consider follow up with your Primary Care Provider.   Your provider has requested that you go for lab work before leaving today.  CBC CMET  Continue probiotic Gummy 1 daily.  Continue Omeprazole 40 mg daily.  Continue Bentyl 10 mg twice daily.  Refills have been sent to your pharmacist.  Follow up with me in six months.  Please call the office for an appointment as the schedule is not available at this time.  Thank you,  Dr. Jackquline Denmark

## 2020-06-20 NOTE — Progress Notes (Signed)
Chief Complaint: Abdominal pain  Referring Provider:  Cher Nakai, MD      ASSESSMENT AND PLAN;   #1. H/O Ac GEitis  04/2019 (resolved)  #2. Chronic LUQ and epigastric pain- neg EGD 08/2017 with neg eso bx for EoE. S/p lap chole in the past. Neg solid-phase GES 08/2016, neg CT abdo/pel 05/2018, neg CTA 03/12/2019.  Likely functional dyspepsia/abdominal wall pain.   #3.  IBS-C, neg colon 07/04/2017 except for small posterior anal fissure.  Plan: - Continue probiotic gummy 1/day and fiber supplements.  Increase water intake. - Continue omeprazole 40 mg p.o. QD - Continue Bentyl 10mg  po bid prn. #60, 6 refills.  She understands that it may cause worsening constipation. - CBC, CMP - FU in 6 months.   HPI:    Kelly Haas is a 49 y.o. female  For follow-up visit Feels significantly better No further nausea/vomiting.  Chronic epigastric and left upper quadrant abdominal pain which gets worse after eating.  Negative extensive work-up as above.  Pain gets better with Percocet as needed.  No diarrhea and constipation now. Little mushy.  Taking probiotics and fiber supplements.  Has been drinking plenty of water.  Negative CTA Abdo/pelvis 03/12/2019 was reviewed independently.  No fever or chills now.  No abdominal pain.  Able to tolerate p.o. without any problems.  Has been working hard on trying to reduce weight and has been able to reduce weight as detailed below.  No melena or hematochezia.  No further nausea/vomiting.  Would like to have medication refill-Bentyl.   Wt Readings from Last 3 Encounters:  06/20/20 219 lb 8 oz (99.6 kg)  05/08/20 215 lb 9.6 oz (97.8 kg)  11/08/19 224 lb 3.2 oz (101.7 kg)     Past Medical History:  Diagnosis Date  . Depression with anxiety   . Fibromyalgia   . G6PD deficiency   . Hypertension   . Kidney stone   . Migraine   . Seizure ([place])   . Seizure disorder ([place]) 11/13/2017  . Thyroid disease     Past Surgical History:   Procedure Laterality Date  . ABDOMINAL HYSTERECTOMY    . APPENDECTOMY    . CHOLECYSTECTOMY    . ESOPHAGOGASTRODUODENOSCOPY  08/29/2017   Mild gastritis. Stauts post empiric esophageal dilitation.   . TOE SURGERY Left    removed toe nail  . TONSILLECTOMY AND ADENOIDECTOMY      Family History  Problem Relation Age of Onset  . Hypertension Mother   . Kidney cancer Mother   . Heart attack Father        61  . Seizures Father   . Stroke Maternal Grandmother   . Pancreatic cancer Maternal Grandfather   . Prostate cancer Maternal Grandfather   . Lung cancer Maternal Grandfather   . Thyroid cancer Sister        Patient reported  . Thyroid cancer Maternal Aunt     Social History   Tobacco Use  . Smoking status: Never Smoker  . Smokeless tobacco: Never Used  Vaping Use  . Vaping Use: Never used  Substance Use Topics  . Alcohol use: No  . Drug use: No    Current Outpatient Medications  Medication Sig Dispense Refill  . amLODipine (NORVASC) 5 MG tablet Take 5 mg by mouth daily.     Marland Kitchen aspirin EC 81 MG tablet Take 81 mg by mouth daily.    . busPIRone (BUSPAR) 10 MG tablet Take 10 mg by mouth 2 (two)  times daily.    . clonazePAM (KLONOPIN) 0.5 MG tablet Take 0.5 mg by mouth 2 (two) times daily as needed for anxiety.    . cyclobenzaprine (FLEXERIL) 10 MG tablet Take 1 tablet (10 mg total) by mouth 3 (three) times daily as needed for Muscle spasms.  3  . dicyclomine (BENTYL) 10 MG capsule Take 1 capsule (10 mg total) by mouth 2 (two) times daily. 90 capsule 0  . EPINEPHrine 0.3 mg/0.3 mL IJ SOAJ injection Inject 0.3 mg into the muscle as needed.     . folic acid (FOLVITE) 1 MG tablet Take 1 mg by mouth daily.  3  . lamoTRIgine (LAMICTAL) 100 MG tablet Take 1.5 tablets (150 mg total) by mouth 2 (two) times daily. 90 tablet 6  . LYRICA 50 MG capsule Take 1 capsule by mouth 2 (two) times daily.   5  . omeprazole (PRILOSEC) 40 MG capsule Take 40 mg by mouth daily.  5  . ondansetron  (ZOFRAN) 4 MG tablet Take 4 mg by mouth every 6 (six) hours as needed.    . predniSONE (DELTASONE) 5 MG tablet Take 5 mg by mouth as needed.    Arvid Right 150 MG/ML prefilled syringe Inject 150 mg into the skin every 30 (thirty) days. 2 injections    . zolpidem (AMBIEN) 10 MG tablet Take 10 mg by mouth at bedtime as needed. for sleep  0  . nitroGLYCERIN (NITROSTAT) 0.4 MG SL tablet Place 1 tablet (0.4 mg total) under the tongue every 5 (five) minutes as needed for chest pain. 25 tablet 2   No current facility-administered medications for this visit.    Allergies  Allergen Reactions  . Bee Venom Anaphylaxis  . Ibuprofen Hives, Nausea And Vomiting and Rash    Other reaction(s): Vomiting (intolerance)  . Acetaminophen   . Aminolevulinate Derivatives   . Aspirin-Acetaminophen-Caffeine   . Dapsone     Other reaction(s): Other (See Comments)  . Dimercaprol     Other reaction(s): Other (See Comments)  . Methylcellulose   . Nitrofuran Derivatives   . Phenazopyridine   . Primaquine   . Toluidine Blue   . Latex Rash  . Oxycodone Rash  . Phenergan [Promethazine Hcl] Rash    Review of Systems:  Reviewed     Physical Exam:    BP 128/84   Pulse (!) 103   Ht 5\' 6"  (1.676 m)   Wt 219 lb 8 oz (99.6 kg)   BMI 35.43 kg/m    Gen: awake, alert, NAD HEENT: anicteric, no pallor CV: RRR, no mrg Pulm: CTA b/l Abd: soft, NT/ND, +BS throughout Ext: no c/c/e Neuro: nonfocal   Data Reviewed: I have personally reviewed following labs and imaging studies  CBC: CBC Latest Ref Rng & Units 08/17/2019 08/27/2018 06/02/2018  WBC 3.4 - 10.8 x10E3/uL 6.8 6.9 7.1  Hemoglobin 11.1 - 15.9 g/dL 12.6 12.7 14.6  Hematocrit 34.0 - 46.6 % 37.7 37.1 42.4  Platelets 150 - 450 x10E3/uL 337 304 329.0    CMP: CMP Latest Ref Rng & Units 08/17/2019 08/27/2018 06/02/2018  Glucose 65 - 99 mg/dL 99 108(H) 97  BUN 6 - 24 mg/dL 11 11 9   Creatinine 0.57 - 1.00 mg/dL 0.93 0.79 1.02  Sodium 134 - 144 mmol/L 141  142 140  Potassium 3.5 - 5.2 mmol/L 4.7 4.4 4.7  Chloride 96 - 106 mmol/L 105 103 103  CO2 20 - 29 mmol/L 25 24 31   Calcium 8.7 - 10.2 mg/dL 9.5 9.2  9.6  Total Protein 6.0 - 8.5 g/dL 6.6 6.3 7.1  Total Bilirubin 0.0 - 1.2 mg/dL 0.3 0.3 0.4  Alkaline Phos 39 - 117 IU/L 103 81 84  AST 0 - 40 IU/L 24 16 12   ALT 0 - 32 IU/L 42(H) 22 14        Carmell Austria, MD 06/20/2020, 10:51 AM  Cc: Cher Nakai, MD

## 2020-06-21 LAB — CBC WITH DIFFERENTIAL/PLATELET
Basophils Absolute: 0 10*3/uL (ref 0.0–0.2)
Basos: 0 %
EOS (ABSOLUTE): 0.1 10*3/uL (ref 0.0–0.4)
Eos: 1 %
Hematocrit: 38.7 % (ref 34.0–46.6)
Hemoglobin: 13 g/dL (ref 11.1–15.9)
Immature Grans (Abs): 0 10*3/uL (ref 0.0–0.1)
Immature Granulocytes: 0 %
Lymphocytes Absolute: 2 10*3/uL (ref 0.7–3.1)
Lymphs: 27 %
MCH: 31 pg (ref 26.6–33.0)
MCHC: 33.6 g/dL (ref 31.5–35.7)
MCV: 92 fL (ref 79–97)
Monocytes Absolute: 0.4 10*3/uL (ref 0.1–0.9)
Monocytes: 5 %
Neutrophils Absolute: 4.9 10*3/uL (ref 1.4–7.0)
Neutrophils: 67 %
Platelets: 356 10*3/uL (ref 150–450)
RBC: 4.2 x10E6/uL (ref 3.77–5.28)
RDW: 11.5 % — ABNORMAL LOW (ref 11.7–15.4)
WBC: 7.4 10*3/uL (ref 3.4–10.8)

## 2020-06-21 LAB — COMPREHENSIVE METABOLIC PANEL
ALT: 9 IU/L (ref 0–32)
AST: 12 IU/L (ref 0–40)
Albumin/Globulin Ratio: 1.6 (ref 1.2–2.2)
Albumin: 4 g/dL (ref 3.8–4.8)
Alkaline Phosphatase: 96 IU/L (ref 48–121)
BUN/Creatinine Ratio: 12 (ref 9–23)
BUN: 13 mg/dL (ref 6–24)
Bilirubin Total: 0.2 mg/dL (ref 0.0–1.2)
CO2: 22 mmol/L (ref 20–29)
Calcium: 9.4 mg/dL (ref 8.7–10.2)
Chloride: 104 mmol/L (ref 96–106)
Creatinine, Ser: 1.1 mg/dL — ABNORMAL HIGH (ref 0.57–1.00)
GFR calc Af Amer: 69 mL/min/{1.73_m2} (ref >59)
GFR calc non Af Amer: 60 mL/min/{1.73_m2} (ref >59)
Globulin, Total: 2.5 g/dL (ref 1.5–4.5)
Glucose: 97 mg/dL (ref 65–99)
Potassium: 4.4 mmol/L (ref 3.5–5.2)
Sodium: 141 mmol/L (ref 134–144)
Total Protein: 6.5 g/dL (ref 6.0–8.5)

## 2020-06-26 DIAGNOSIS — H43823 Vitreomacular adhesion, bilateral: Secondary | ICD-10-CM | POA: Diagnosis not present

## 2020-06-26 DIAGNOSIS — H5203 Hypermetropia, bilateral: Secondary | ICD-10-CM | POA: Diagnosis not present

## 2020-06-26 DIAGNOSIS — H04123 Dry eye syndrome of bilateral lacrimal glands: Secondary | ICD-10-CM | POA: Diagnosis not present

## 2020-06-26 DIAGNOSIS — H11132 Conjunctival pigmentations, left eye: Secondary | ICD-10-CM | POA: Diagnosis not present

## 2020-06-26 DIAGNOSIS — H524 Presbyopia: Secondary | ICD-10-CM | POA: Diagnosis not present

## 2020-06-26 DIAGNOSIS — H43393 Other vitreous opacities, bilateral: Secondary | ICD-10-CM | POA: Diagnosis not present

## 2020-06-27 DIAGNOSIS — R768 Other specified abnormal immunological findings in serum: Secondary | ICD-10-CM | POA: Diagnosis not present

## 2020-06-27 DIAGNOSIS — R079 Chest pain, unspecified: Secondary | ICD-10-CM | POA: Diagnosis not present

## 2020-06-27 DIAGNOSIS — G5620 Lesion of ulnar nerve, unspecified upper limb: Secondary | ICD-10-CM | POA: Diagnosis not present

## 2020-06-27 DIAGNOSIS — M797 Fibromyalgia: Secondary | ICD-10-CM | POA: Diagnosis not present

## 2020-06-27 DIAGNOSIS — E042 Nontoxic multinodular goiter: Secondary | ICD-10-CM | POA: Diagnosis not present

## 2020-06-27 DIAGNOSIS — G4700 Insomnia, unspecified: Secondary | ICD-10-CM | POA: Diagnosis not present

## 2020-06-27 DIAGNOSIS — R110 Nausea: Secondary | ICD-10-CM | POA: Diagnosis not present

## 2020-06-27 DIAGNOSIS — F419 Anxiety disorder, unspecified: Secondary | ICD-10-CM | POA: Diagnosis not present

## 2020-06-27 DIAGNOSIS — G4762 Sleep related leg cramps: Secondary | ICD-10-CM | POA: Diagnosis not present

## 2020-06-27 DIAGNOSIS — R55 Syncope and collapse: Secondary | ICD-10-CM | POA: Diagnosis not present

## 2020-06-27 DIAGNOSIS — R519 Headache, unspecified: Secondary | ICD-10-CM | POA: Diagnosis not present

## 2020-06-27 DIAGNOSIS — K219 Gastro-esophageal reflux disease without esophagitis: Secondary | ICD-10-CM | POA: Diagnosis not present

## 2020-07-26 DIAGNOSIS — R079 Chest pain, unspecified: Secondary | ICD-10-CM | POA: Diagnosis not present

## 2020-07-26 DIAGNOSIS — R55 Syncope and collapse: Secondary | ICD-10-CM | POA: Diagnosis not present

## 2020-07-26 DIAGNOSIS — G4762 Sleep related leg cramps: Secondary | ICD-10-CM | POA: Diagnosis not present

## 2020-07-26 DIAGNOSIS — M797 Fibromyalgia: Secondary | ICD-10-CM | POA: Diagnosis not present

## 2020-07-26 DIAGNOSIS — R768 Other specified abnormal immunological findings in serum: Secondary | ICD-10-CM | POA: Diagnosis not present

## 2020-07-26 DIAGNOSIS — K219 Gastro-esophageal reflux disease without esophagitis: Secondary | ICD-10-CM | POA: Diagnosis not present

## 2020-07-26 DIAGNOSIS — G5620 Lesion of ulnar nerve, unspecified upper limb: Secondary | ICD-10-CM | POA: Diagnosis not present

## 2020-07-26 DIAGNOSIS — G4700 Insomnia, unspecified: Secondary | ICD-10-CM | POA: Diagnosis not present

## 2020-07-26 DIAGNOSIS — E042 Nontoxic multinodular goiter: Secondary | ICD-10-CM | POA: Diagnosis not present

## 2020-07-26 DIAGNOSIS — R110 Nausea: Secondary | ICD-10-CM | POA: Diagnosis not present

## 2020-07-26 DIAGNOSIS — F419 Anxiety disorder, unspecified: Secondary | ICD-10-CM | POA: Diagnosis not present

## 2020-07-26 DIAGNOSIS — R519 Headache, unspecified: Secondary | ICD-10-CM | POA: Diagnosis not present

## 2020-07-28 DIAGNOSIS — M797 Fibromyalgia: Secondary | ICD-10-CM | POA: Diagnosis not present

## 2020-07-28 DIAGNOSIS — R768 Other specified abnormal immunological findings in serum: Secondary | ICD-10-CM | POA: Diagnosis not present

## 2020-07-28 DIAGNOSIS — G4762 Sleep related leg cramps: Secondary | ICD-10-CM | POA: Diagnosis not present

## 2020-07-28 DIAGNOSIS — K219 Gastro-esophageal reflux disease without esophagitis: Secondary | ICD-10-CM | POA: Diagnosis not present

## 2020-07-28 DIAGNOSIS — M159 Polyosteoarthritis, unspecified: Secondary | ICD-10-CM | POA: Diagnosis not present

## 2020-07-28 DIAGNOSIS — R519 Headache, unspecified: Secondary | ICD-10-CM | POA: Diagnosis not present

## 2020-07-28 DIAGNOSIS — R55 Syncope and collapse: Secondary | ICD-10-CM | POA: Diagnosis not present

## 2020-07-28 DIAGNOSIS — R110 Nausea: Secondary | ICD-10-CM | POA: Diagnosis not present

## 2020-07-28 DIAGNOSIS — R569 Unspecified convulsions: Secondary | ICD-10-CM | POA: Diagnosis not present

## 2020-07-28 DIAGNOSIS — E042 Nontoxic multinodular goiter: Secondary | ICD-10-CM | POA: Diagnosis not present

## 2020-07-28 DIAGNOSIS — R079 Chest pain, unspecified: Secondary | ICD-10-CM | POA: Diagnosis not present

## 2020-07-28 DIAGNOSIS — G5620 Lesion of ulnar nerve, unspecified upper limb: Secondary | ICD-10-CM | POA: Diagnosis not present

## 2020-08-02 ENCOUNTER — Emergency Department (HOSPITAL_COMMUNITY)
Admission: EM | Admit: 2020-08-02 | Discharge: 2020-08-03 | Disposition: A | Discharge status: Home / Self Care | Payer: Medicare Other | Attending: Emergency Medicine | Admitting: Emergency Medicine

## 2020-08-02 ENCOUNTER — Other Ambulatory Visit: Payer: Self-pay

## 2020-08-02 DIAGNOSIS — R55 Syncope and collapse: Secondary | ICD-10-CM | POA: Diagnosis not present

## 2020-08-02 DIAGNOSIS — I10 Essential (primary) hypertension: Secondary | ICD-10-CM | POA: Diagnosis not present

## 2020-08-02 DIAGNOSIS — H53149 Visual discomfort, unspecified: Secondary | ICD-10-CM | POA: Insufficient documentation

## 2020-08-02 DIAGNOSIS — Z79899 Other long term (current) drug therapy: Secondary | ICD-10-CM | POA: Diagnosis not present

## 2020-08-02 DIAGNOSIS — R110 Nausea: Secondary | ICD-10-CM | POA: Insufficient documentation

## 2020-08-02 DIAGNOSIS — R531 Weakness: Secondary | ICD-10-CM | POA: Insufficient documentation

## 2020-08-02 DIAGNOSIS — R0689 Other abnormalities of breathing: Secondary | ICD-10-CM | POA: Diagnosis not present

## 2020-08-02 DIAGNOSIS — R069 Unspecified abnormalities of breathing: Secondary | ICD-10-CM | POA: Diagnosis not present

## 2020-08-02 DIAGNOSIS — R410 Disorientation, unspecified: Secondary | ICD-10-CM | POA: Diagnosis not present

## 2020-08-02 DIAGNOSIS — R569 Unspecified convulsions: Secondary | ICD-10-CM | POA: Insufficient documentation

## 2020-08-02 DIAGNOSIS — Z91040 Latex allergy status: Secondary | ICD-10-CM | POA: Diagnosis not present

## 2020-08-02 DIAGNOSIS — G40909 Epilepsy, unspecified, not intractable, without status epilepticus: Secondary | ICD-10-CM | POA: Diagnosis not present

## 2020-08-02 DIAGNOSIS — R5383 Other fatigue: Secondary | ICD-10-CM | POA: Diagnosis not present

## 2020-08-02 DIAGNOSIS — R4020 Unspecified coma: Secondary | ICD-10-CM | POA: Diagnosis not present

## 2020-08-02 NOTE — ED Notes (Addendum)
Pt reports last seizure greater than a year, pt denies biting tongue, denies blurry or double vision, denies urinary incontinence during seizure. Pt states " My grandson said I had a seizure and hit my head." Pt has no memory of seizure, +LOC and hit head.
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
## 2020-08-03 ENCOUNTER — Emergency Department (HOSPITAL_COMMUNITY): Payer: Medicare Other

## 2020-08-03 ENCOUNTER — Telehealth: Payer: Self-pay | Admitting: Neurology

## 2020-08-03 DIAGNOSIS — R569 Unspecified convulsions: Secondary | ICD-10-CM | POA: Diagnosis not present

## 2020-08-03 LAB — CBC WITH DIFFERENTIAL/PLATELET
Abs Immature Granulocytes: 0.03 10*3/uL (ref 0.00–0.07)
Basophils Absolute: 0 10*3/uL (ref 0.0–0.1)
Basophils Relative: 0 %
Eosinophils Absolute: 0.1 10*3/uL (ref 0.0–0.5)
Eosinophils Relative: 0 %
HCT: 39.3 % (ref 36.0–46.0)
Hemoglobin: 12.7 g/dL (ref 12.0–15.0)
Immature Granulocytes: 0 %
Lymphocytes Relative: 28 %
Lymphs Abs: 3.2 10*3/uL (ref 0.7–4.0)
MCH: 30.5 pg (ref 26.0–34.0)
MCHC: 32.3 g/dL (ref 30.0–36.0)
MCV: 94.5 fL (ref 80.0–100.0)
Monocytes Absolute: 0.7 10*3/uL (ref 0.1–1.0)
Monocytes Relative: 6 %
Neutro Abs: 7.5 10*3/uL (ref 1.7–7.7)
Neutrophils Relative %: 66 %
Platelets: 365 10*3/uL (ref 150–400)
RBC: 4.16 MIL/uL (ref 3.87–5.11)
RDW: 12.1 % (ref 11.5–15.5)
WBC: 11.6 10*3/uL — ABNORMAL HIGH (ref 4.0–10.5)
nRBC: 0 % (ref 0.0–0.2)

## 2020-08-03 LAB — URINALYSIS, ROUTINE W REFLEX MICROSCOPIC
Bacteria, UA: NONE SEEN
Bilirubin Urine: NEGATIVE
Glucose, UA: NEGATIVE mg/dL
Hgb urine dipstick: NEGATIVE
Ketones, ur: NEGATIVE mg/dL
Nitrite: NEGATIVE
Protein, ur: NEGATIVE mg/dL
Specific Gravity, Urine: 1.03 (ref 1.005–1.030)
pH: 5 (ref 5.0–8.0)

## 2020-08-03 LAB — COMPREHENSIVE METABOLIC PANEL
ALT: 16 U/L (ref 0–44)
AST: 18 U/L (ref 15–41)
Albumin: 3.8 g/dL (ref 3.5–5.0)
Alkaline Phosphatase: 67 U/L (ref 38–126)
Anion gap: 9 (ref 5–15)
BUN: 11 mg/dL (ref 6–20)
CO2: 25 mmol/L (ref 22–32)
Calcium: 9.4 mg/dL (ref 8.9–10.3)
Chloride: 106 mmol/L (ref 98–111)
Creatinine, Ser: 1.03 mg/dL — ABNORMAL HIGH (ref 0.44–1.00)
GFR calc Af Amer: >60 mL/min (ref >60)
GFR calc non Af Amer: >60 mL/min (ref >60)
Glucose, Bld: 100 mg/dL — ABNORMAL HIGH (ref 70–99)
Potassium: 3.8 mmol/L (ref 3.5–5.1)
Sodium: 140 mmol/L (ref 135–145)
Total Bilirubin: 0.5 mg/dL (ref 0.3–1.2)
Total Protein: 7.1 g/dL (ref 6.5–8.1)

## 2020-08-03 LAB — TROPONIN I (HIGH SENSITIVITY)
Troponin I (High Sensitivity): 4 ng/L (ref <18)
Troponin I (High Sensitivity): 4 ng/L (ref <18)

## 2020-08-03 LAB — RAPID URINE DRUG SCREEN, HOSP PERFORMED
Amphetamines: NOT DETECTED
Barbiturates: NOT DETECTED
Benzodiazepines: POSITIVE — AB
Cocaine: NOT DETECTED
Opiates: NOT DETECTED
Tetrahydrocannabinol: NOT DETECTED

## 2020-08-03 LAB — I-STAT BETA HCG BLOOD, ED (MC, WL, AP ONLY): I-stat hCG, quantitative: <5 m[IU]/mL (ref <5)

## 2020-08-03 LAB — ETHANOL: Alcohol, Ethyl (B): <10 mg/dL (ref <10)

## 2020-08-03 IMAGING — CT CT HEAD W/O CM
4 series · 17 of 47 positions shown, 19 images · non-contrast
Comparison: MRI [DATE], CT brain [DATE]

CLINICAL DATA: Seizure unresponsive

EXAM:
CT HEAD WITHOUT CONTRAST
TECHNIQUE: Contiguous axial images were obtained from the base of the skull
through the vertex without intravenous contrast.

[Series 3: head wo · axial · 0.40mm/px · z∈[-184,-69]mm · 7 of 31 slices shown, 9 images]
[im 4/31  brain]
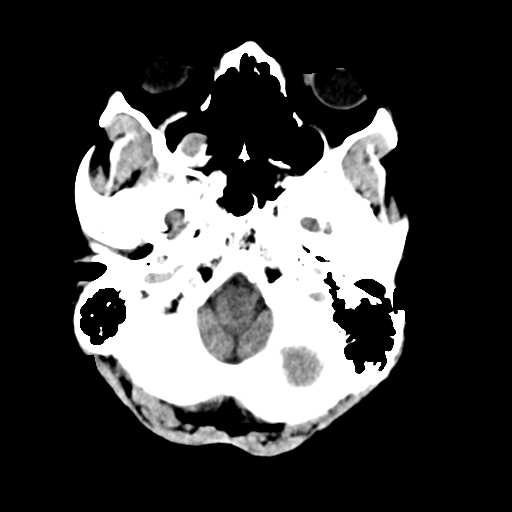
[im 4/31  bone]
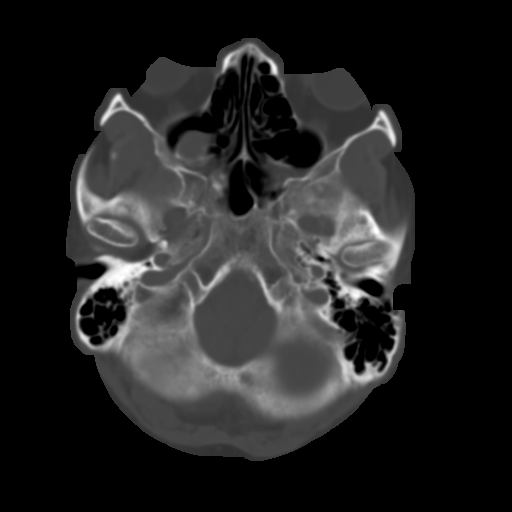
[im 8/31  brain]
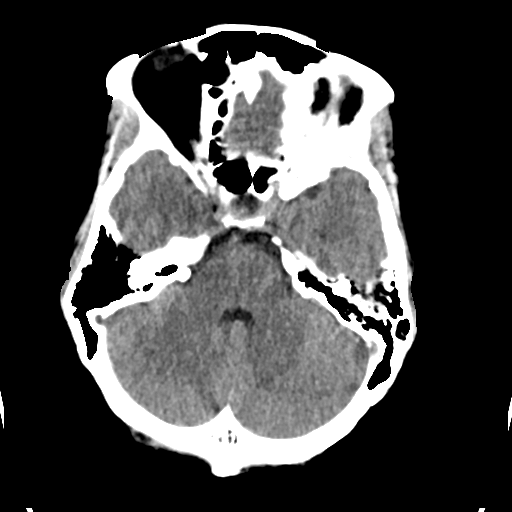
[im 12/31  brain]
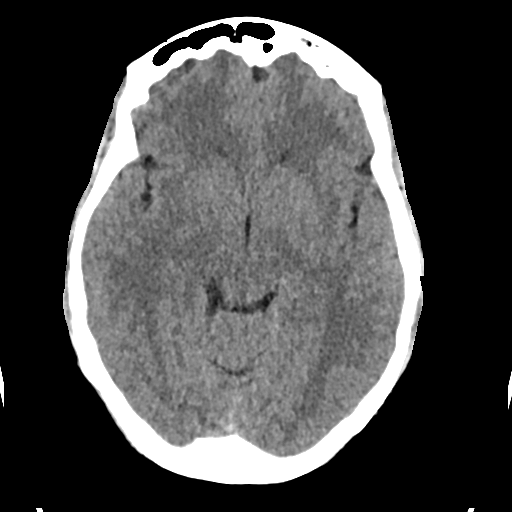
[im 16/31  brain]
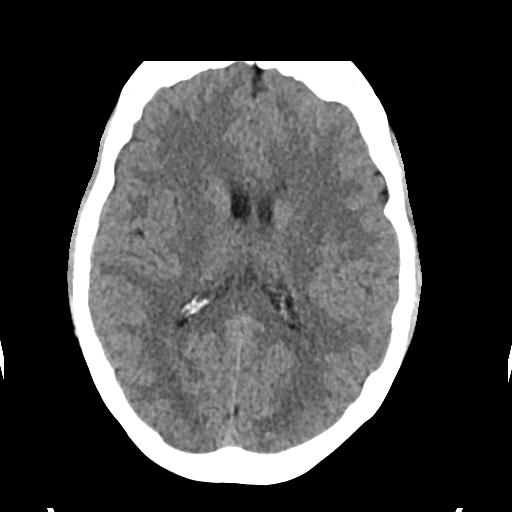
[im 19/31  brain]
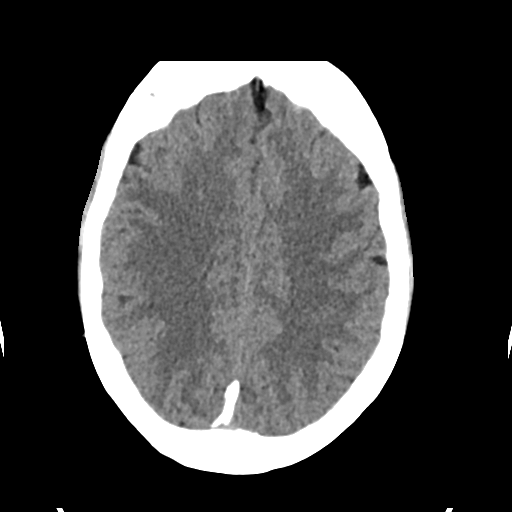
[im 19/31  bone]
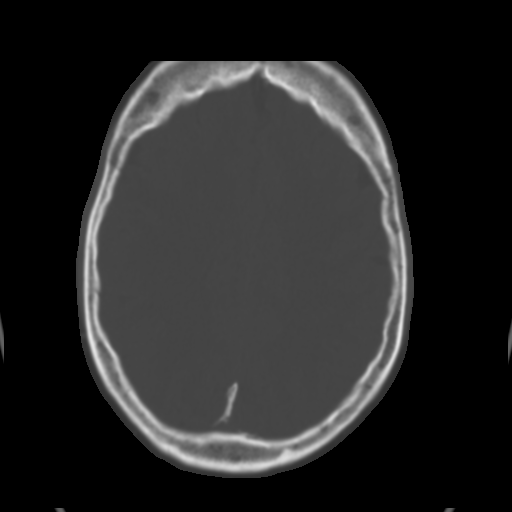
[im 23/31  brain]
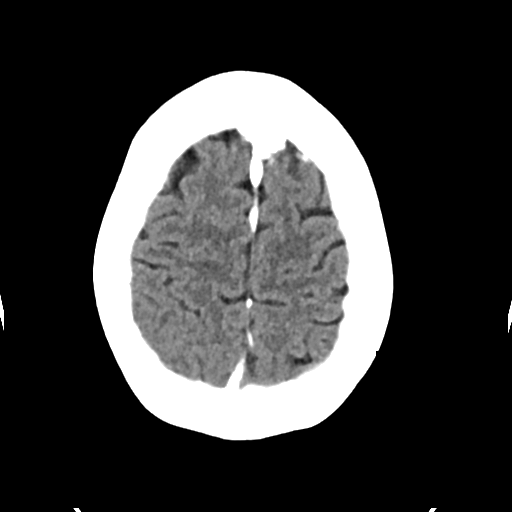
[im 27/31  brain]
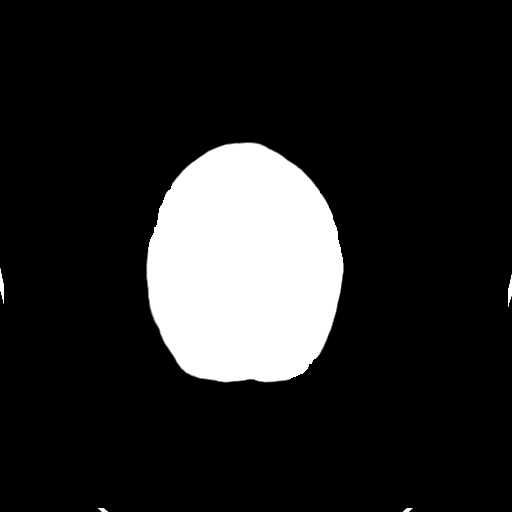

[Series 4: head bone · axial · 0.40mm/px · z∈[-185,-131]mm · 4 of 77 slices shown]
[im 8/77  bone]
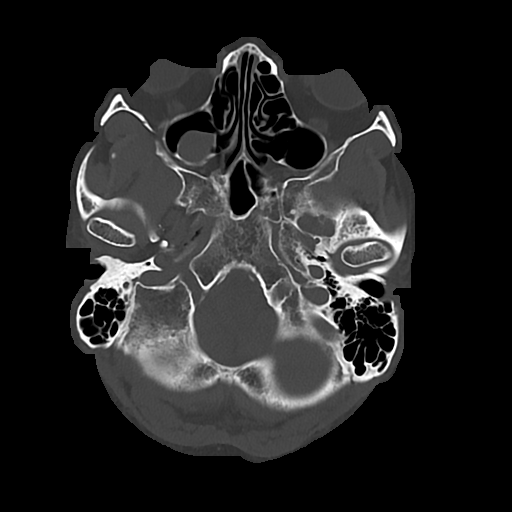
[im 16/77  bone]
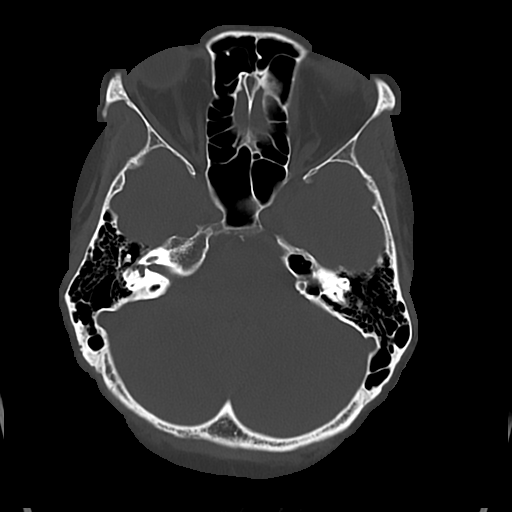
[im 23/77  bone]
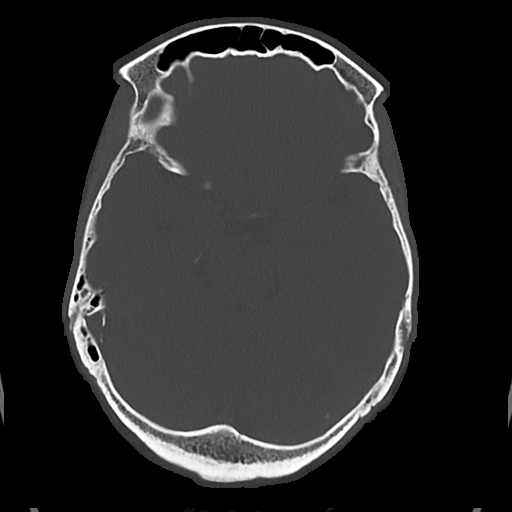
[im 35/77  bone]
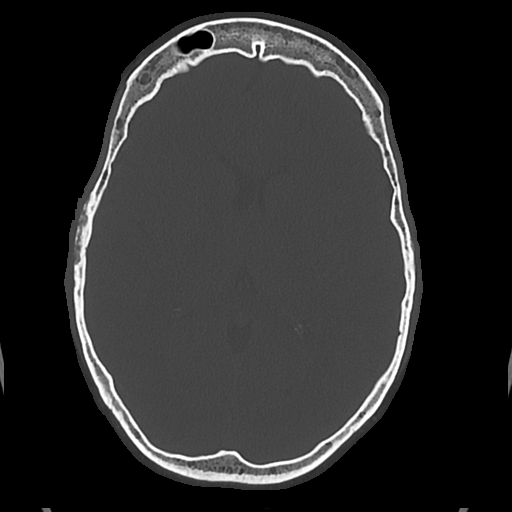

[Series 5: cor soft · coronal · 0.31mm/px · 3 of 67 slices shown]
[im 23/67  brain]
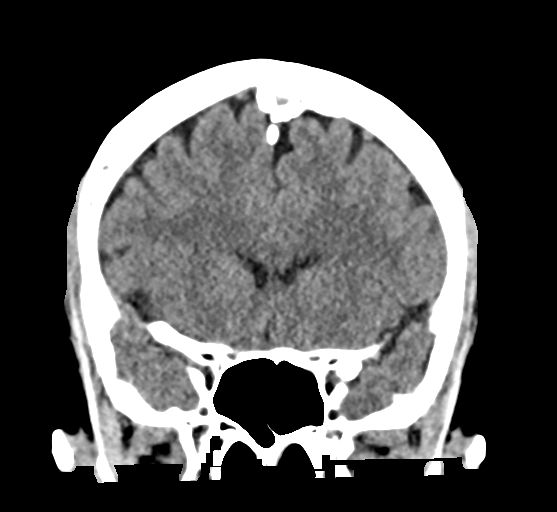
[im 30/67  brain]
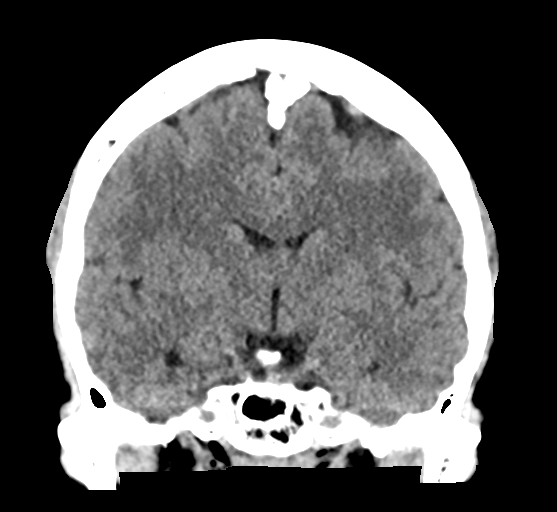
[im 37/67  brain]
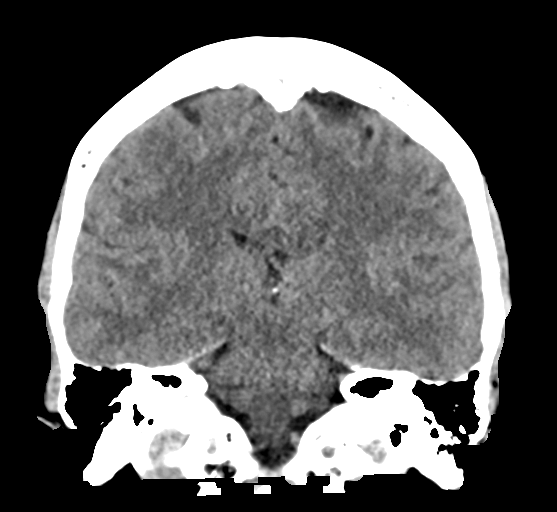

[Series 6: sag soft · sagittal · 0.32mm/px · 3 of 58 slices shown]
[im 20/58  brain]
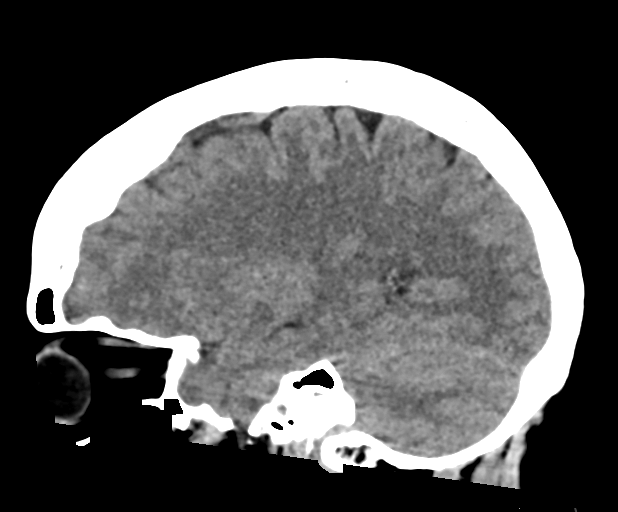
[im 29/58  brain]
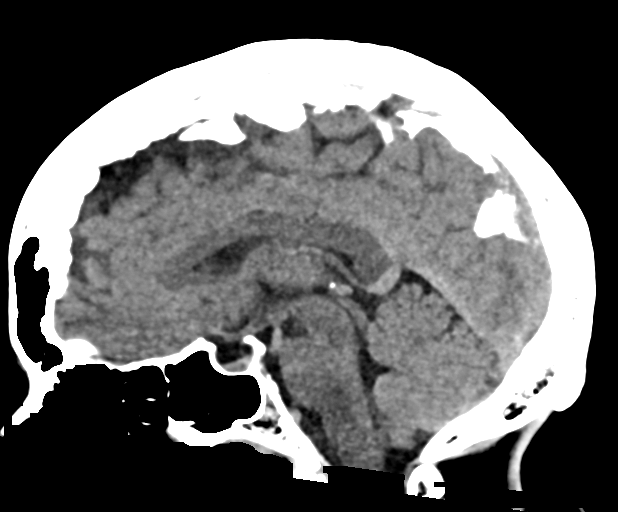
[im 39/58  brain]
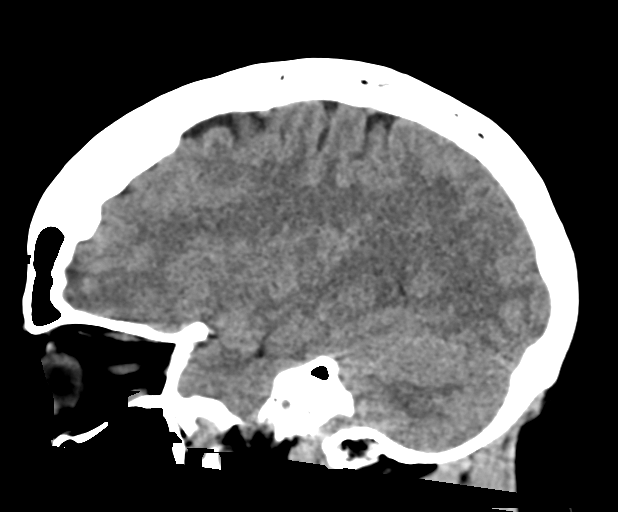

[17 of 47 positions shown; findings below may reference images not displayed]

FINDINGS: Brain: No acute territorial infarction, hemorrhage, or intracranial
mass. The ventricles are nonenlarged.

Vascular: No hyperdense vessel or unexpected calcification.

Skull: Normal. Negative for fracture or focal lesion.

Sinuses/Orbits: Mucous retention cyst in the right maxillary sinus

Other: None
IMPRESSION: Negative non contrasted CT appearance of the brain.

## 2020-08-03 NOTE — ED Notes (Signed)
Patient transported to CT 

## 2020-08-03 NOTE — ED Notes (Signed)
Pt ambulated with steady gait to pt restroom with steady gait. Pt able to tolerate fluid challenge, pt denies nausea. Pt denies having pt lightheadedness and dizziness.

## 2020-08-03 NOTE — Telephone Encounter (Signed)
Patient was in the ER for questionable seizure, will need follow-up appointment please.

## 2020-08-03 NOTE — Discharge Instructions (Signed)
It appears you had a seizure today.  You should continue your seizure medication as prescribed.  As we discussed, no driving for 6 months or operating heavy machinery or swimming or bathing alone until cleared by your neurologist.  Return to the ED with chest pain, shortness of breath, confusion, behavior change, unilateral weakness, numbness, tingling, difficulty speaking or difficulty swallowing or any other concerns.

## 2020-08-03 NOTE — Telephone Encounter (Signed)
Nothing available at this time for SS/NP. Will place on cancellation list.

## 2020-08-07 ENCOUNTER — Telehealth: Payer: Self-pay | Admitting: Neurology

## 2020-08-07 ENCOUNTER — Ambulatory Visit (INDEPENDENT_AMBULATORY_CARE_PROVIDER_SITE_OTHER): Payer: Medicare Other | Admitting: Neurology

## 2020-08-07 ENCOUNTER — Encounter: Payer: Self-pay | Admitting: Neurology

## 2020-08-07 ENCOUNTER — Other Ambulatory Visit: Payer: Self-pay

## 2020-08-07 VITALS — BP 106/71 | HR 109 | Ht 65.0 in | Wt 222.0 lb

## 2020-08-07 DIAGNOSIS — G40909 Epilepsy, unspecified, not intractable, without status epilepticus: Secondary | ICD-10-CM | POA: Diagnosis not present

## 2020-08-07 MED ORDER — LAMOTRIGINE 100 MG PO TABS: 150.0000 mg | ORAL_TABLET | Freq: Two times a day (BID) | ORAL | 6 refills | Status: DC

## 2020-08-07 MED ORDER — ONDANSETRON HCL 4 MG PO TABS: 4.0000 mg | ORAL_TABLET | Freq: Three times a day (TID) | ORAL | 3 refills | Status: DC | PRN

## 2020-08-07 NOTE — Telephone Encounter (Signed)
LMVM for pt that appt available with SS/NP 08-09-20 1415 or 9/20-21 at 1545.

## 2020-08-07 NOTE — Telephone Encounter (Signed)
Pt has called back and accepted the slot for today, she is aware to check in at 12:45 for the 1:15 appointment.  This is Pharmacist, hospital

## 2020-08-07 NOTE — Progress Notes (Signed)
PATIENT: Kelly Haas DOB: [birth year]  REASON FOR VISIT: follow up HISTORY FROM: patient  HISTORY OF PRESENT ILLNESS: Today 08/07/20  Kelly Haas is a 49 year old female with history of seizures.  She stopped Depakote due to reported hair loss.  She is on Lamictal supposed to be taking 150 mg twice a day.  Was recently in the ER on 08/02/2020 for questionable seizure, had been feeling nauseated for 2 days prior, drove to the store to buy ginger ale, on the way home, saw bright lights on an emergency vehicle that she felt triggered a seizure, had only been taking Lamictal 100 mg BID.  Does not remember the event, family reported seizure activity, she was lying in the grass outside her daughters apartment, her grandson was in the car.  Was given 5 mg Versed by EMS for anxiety attack, her daughter gave her her EpiPen for possible allergic reaction, reported she was itchy.  No oral injury, but she reports urinary incontinence.  CT head was unremarkable.  Blood work showed WBC 11.6, creatinine 1.03, ethanol negative, troponin negative, UDS positive for benzos.  Has been under significant stress, for the last 2 months, has been sleeping in her car or various places, has had unusual sleeping patterns.  Unclear etiology of nausea, supposed to be on Bentyl for this, sees GI.  Presents today for evaluation alone.  HISTORY 08/17/2019 SS: Kelly Haas is a 49 year old female with history of seizures.  In the past she was taking Depakote, but she is not taking the medication compliantly.  She felt the Depakote caused hair loss.  Her last seizure occurred in April 2020 was described as a generalized seizure.  After her last visit in June 2020, she was switched to Lamictal.  She is currently taking Lamictal 100 mg twice a day.  She indicates she is tolerating medication well.  She says she has not had recurrent seizure.  She does report she was in the hospital in July for panic attacks.  She is currently taking BuSpar  and clonazepam for anxiety.  She reports she has been waking up in the middle of the night a few times a week, feeling like she is shaking.  She says this is not a seizure.  She says this feels like her anxiety or panic attacks.  During the episodes, she is alert, denies incontinence or oral injury.  She sees Dr. Truman Hayward in [facility] for her primary care doctor.  She presents today for follow-up unaccompanied.   REVIEW OF SYSTEMS: Out of a complete 14 system review of symptoms, the patient complains only of the following symptoms, and all other reviewed systems are negative.  Seizure, nausea  ALLERGIES: Allergies  Allergen Reactions  . Bee Venom Anaphylaxis  . Ibuprofen Hives, Nausea And Vomiting and Rash    Other reaction(s): Vomiting (intolerance)  . Acetaminophen   . Aminolevulinate Derivatives   . Aspirin-Acetaminophen-Caffeine   . Dapsone     Other reaction(s): Other (See Comments)  . Dimercaprol     Other reaction(s): Other (See Comments)  . Methylcellulose   . Nitrofuran Derivatives   . Phenazopyridine   . Primaquine   . Toluidine Blue   . Celebrex [Celecoxib] Hives and Rash  . Latex Rash  . Oxycodone Rash  . Phenergan [Promethazine Hcl] Rash    HOME MEDICATIONS: Outpatient Medications Prior to Visit  Medication Sig Dispense Refill  . amLODipine (NORVASC) 5 MG tablet Take 5 mg by mouth daily.     Marland Kitchen  aspirin EC 81 MG tablet Take 81 mg by mouth daily.    . busPIRone (BUSPAR) 10 MG tablet Take 10 mg by mouth 2 (two) times daily.    . clonazePAM (KLONOPIN) 0.5 MG tablet Take 0.5 mg by mouth 2 (two) times daily as needed for anxiety.    . cyclobenzaprine (FLEXERIL) 10 MG tablet Take 1 tablet (10 mg total) by mouth 3 (three) times daily as needed for Muscle spasms.  3  . dicyclomine (BENTYL) 10 MG capsule Take 1 capsule (10 mg total) by mouth 2 (two) times daily. 60 capsule 6  . EPINEPHrine 0.3 mg/0.3 mL IJ SOAJ injection Inject 0.3 mg into the muscle as needed.     . folic acid  (FOLVITE) 1 MG tablet Take 1 mg by mouth daily.  3  . furosemide (LASIX) 20 MG tablet Take 20 mg by mouth.    Marland Kitchen LYRICA 50 MG capsule Take 1 capsule by mouth 2 (two) times daily.   5  . omeprazole (PRILOSEC) 40 MG capsule Take 40 mg by mouth daily.  5  . predniSONE (DELTASONE) 5 MG tablet Take 5 mg by mouth as needed.    Arvid Right 150 MG/ML prefilled syringe Inject 150 mg into the skin every 30 (thirty) days. 2 injections    . zolpidem (AMBIEN) 10 MG tablet Take 10 mg by mouth at bedtime as needed. for sleep  0  . lamoTRIgine (LAMICTAL) 100 MG tablet Take 1.5 tablets (150 mg total) by mouth 2 (two) times daily. 90 tablet 6  . ondansetron (ZOFRAN) 4 MG tablet Take 4 mg by mouth every 6 (six) hours as needed.    . nitroGLYCERIN (NITROSTAT) 0.4 MG SL tablet Place 1 tablet (0.4 mg total) under the tongue every 5 (five) minutes as needed for chest pain. 25 tablet 2   No facility-administered medications prior to visit.    PAST MEDICAL HISTORY: Past Medical History:  Diagnosis Date  . Depression with anxiety   . Fibromyalgia   . G6PD deficiency   . Hypertension   . Kidney stone   . Migraine   . Seizure ([place])   . Seizure disorder ([place]) 11/13/2017  . Thyroid disease     PAST SURGICAL HISTORY: Past Surgical History:  Procedure Laterality Date  . ABDOMINAL HYSTERECTOMY    . APPENDECTOMY    . CHOLECYSTECTOMY    . ESOPHAGOGASTRODUODENOSCOPY  08/29/2017   Mild gastritis. [place]auts post empiric esophageal dilitation.   . TOE SURGERY Left    removed toe nail  . TONSILLECTOMY AND ADENOIDECTOMY      FAMILY HISTORY: Family History  Problem Relation Age of Onset  . Hypertension Mother   . Kidney cancer Mother   . Heart attack Father        31  . Seizures Father   . Stroke Maternal Grandmother   . Pancreatic cancer Maternal Grandfather   . Prostate cancer Maternal Grandfather   . Lung cancer Maternal Grandfather   . Thyroid cancer Sister        Patient reported  . Thyroid cancer  Maternal Aunt     SOCIAL HISTORY: Social History   Socioeconomic History  . Marital status: Single    Spouse name: Not on file  . Number of children: 3  . Years of education: 33  . Highest education level: Associate degree: occupational, Hotel manager, or vocational program  Occupational History  . Occupation: Disabled  Tobacco Use  . Smoking status: Never Smoker  . Smokeless tobacco: Never Used  Vaping Use  . Vaping Use: Never used  Substance and Sexual Activity  . Alcohol use: No  . Drug use: No  . Sexual activity: Not on file  Other Topics Concern  . Not on file  Social History Narrative   Lives at home with daughter, son and grandson.   Right-handed.   No caffeine use.   Social Determinants of Health   Financial Resource Strain:   . Difficulty of Paying Living Expenses: Not on file  Food Insecurity:   . Worried About Charity fundraiser in the Last Year: Not on file  . Ran Out of Food in the Last Year: Not on file  Transportation Needs:   . Lack of Transportation (Medical): Not on file  . Lack of Transportation (Non-Medical): Not on file  Physical Activity:   . Days of Exercise per Week: Not on file  . Minutes of Exercise per Session: Not on file  Stress:   . Feeling of Stress : Not on file  Social Connections:   . Frequency of Communication with Friends and Family: Not on file  . Frequency of Social Gatherings with Friends and Family: Not on file  . Attends Religious Services: Not on file  . Active Member of Clubs or Organizations: Not on file  . Attends Archivist Meetings: Not on file  . Marital Status: Not on file  Intimate Partner Violence:   . Fear of Current or Ex-Partner: Not on file  . Emotionally Abused: Not on file  . Physically Abused: Not on file  . Sexually Abused: Not on file   PHYSICAL EXAM  Vitals:   08/07/20 1251  BP: 106/71  Pulse: (!) 109  Weight: 222 lb (100.7 kg)  Height: 5\' 5"  (1.651 m)   Body mass index is 36.94  kg/m.  Generalized: Well developed, in no acute distress   Neurological examination  Mentation: Alert oriented to time, place, history taking. Follows all commands speech and language fluent Cranial nerve II-XII: Pupils were equal round reactive to light. Extraocular movements were full, visual field were full on confrontational test. Facial sensation and strength were normal.  Head turning and shoulder shrug  were normal and symmetric. Motor: The motor testing reveals 5 over 5 strength of all 4 extremities. Good symmetric motor tone is noted throughout.  Sensory: Sensory testing is intact to soft touch on all 4 extremities. No evidence of extinction is noted.  Coordination: Cerebellar testing reveals good finger-nose-finger and heel-to-shin bilaterally.  Gait and station: Gait is normal.  Reflexes: Deep tendon reflexes are symmetric and normal bilaterally.   DIAGNOSTIC DATA (LABS, IMAGING, TESTING) - I reviewed patient records, labs, notes, testing and imaging myself where available.  Lab Results  Component Value Date   WBC 11.6 (H) 08/03/2020   HGB 12.7 08/03/2020   HCT 39.3 08/03/2020   MCV 94.5 08/03/2020   PLT 365 08/03/2020      Component Value Date/Time   NA 140 08/03/2020 0046   NA 141 06/20/2020 1118   K 3.8 08/03/2020 0046   CL 106 08/03/2020 0046   CO2 25 08/03/2020 0046   GLUCOSE 100 (H) 08/03/2020 0046   BUN 11 08/03/2020 0046   BUN 13 06/20/2020 1118   CREATININE 1.03 (H) 08/03/2020 0046   CALCIUM 9.4 08/03/2020 0046   PROT 7.1 08/03/2020 0046   PROT 6.5 06/20/2020 1118   ALBUMIN 3.8 08/03/2020 0046   ALBUMIN 4.0 06/20/2020 1118   AST 18 08/03/2020 0046   ALT  16 08/03/2020 0046   ALKPHOS 67 08/03/2020 0046   BILITOT 0.5 08/03/2020 0046   BILITOT 0.2 06/20/2020 1118   GFRNONAA >60 08/03/2020 0046   GFRAA >60 08/03/2020 0046   No results found for: CHOL, HDL, LDLCALC, LDLDIRECT, TRIG, CHOLHDL No results found for: HGBA1C No results found for:  VITAMINB12 Lab Results  Component Value Date   TSH 0.32 (L) 05/08/2020    ASSESSMENT AND PLAN 49 y.o. year old female  has a past medical history of Depression with anxiety, Fibromyalgia, G6PD deficiency, Hypertension, Kidney stone, Migraine, Seizure ([place]), Seizure disorder ([place]) (11/13/2017), and Thyroid disease. here with:  1.  Seizure disorder, recent seizure 08/02/2020 2.  Anxiety, depression  -Recent seizure while taking Lamictal 100 mg twice a day (lowered dose due to nausea) -Will increase back to Lamictal 150 mg twice daily (increase evening dose first x 2 weeks up to 150 mg, then morning as well) -Refill Zofran PRN for nausea, needs to contact PCP and GI for further inquiry into nausea -We tried to help with resources since she has been homeless for several weeks -No driving until seizure-free for 6 months, follow-up in this office in 4 months  I spent 30 minutes of face-to-face and non-face-to-face time with patient.  This included previsit chart review, lab review, study review, order entry, electronic health record documentation, patient education.  Butler Denmark, AGNP-C, DNP 08/07/2020, 1:25 PM [facility] [address] [place], [place] [address] [phone]

## 2020-08-07 NOTE — Telephone Encounter (Signed)
Spoke to patient today trying to get her with somewhere to stay because she has been sleeping in her car the past few months.  Patient states she lost her housing assistance in [place] . Patient did apply in [place] and they told her she did not qualify.  I called housing and left message's while patient was there with me for his all three locations in [place] . I gave patient the number's as well so she could follow up.   I also Reached out to the women's resource center and they could not help because her son was 59 .  [facility] could not help .  I did give patient a lunch box we had and some water.

## 2020-08-07 NOTE — Telephone Encounter (Signed)
Noted  

## 2020-08-07 NOTE — Patient Instructions (Addendum)
Please take the Lamictal 150 mg twice daily (increase the evening dose up first for 2 weeks, then the morning), I will send in Zofran for nausea, please discuss the nausea with your primary doctor or GI, no driving until seizure free for 6 months, call for seizure activity, see you back in 4 months

## 2020-08-08 NOTE — Progress Notes (Signed)
I have read the note, and I agree with the clinical assessment and plan.  Zoua Caporaso K Clotine Heiner   

## 2020-08-14 DIAGNOSIS — B9689 Other specified bacterial agents as the cause of diseases classified elsewhere: Secondary | ICD-10-CM | POA: Diagnosis not present

## 2020-08-14 DIAGNOSIS — R768 Other specified abnormal immunological findings in serum: Secondary | ICD-10-CM | POA: Diagnosis not present

## 2020-08-14 DIAGNOSIS — R079 Chest pain, unspecified: Secondary | ICD-10-CM | POA: Diagnosis not present

## 2020-08-14 DIAGNOSIS — K219 Gastro-esophageal reflux disease without esophagitis: Secondary | ICD-10-CM | POA: Diagnosis not present

## 2020-08-14 DIAGNOSIS — E042 Nontoxic multinodular goiter: Secondary | ICD-10-CM | POA: Diagnosis not present

## 2020-08-14 DIAGNOSIS — J028 Acute pharyngitis due to other specified organisms: Secondary | ICD-10-CM | POA: Diagnosis not present

## 2020-08-14 DIAGNOSIS — M797 Fibromyalgia: Secondary | ICD-10-CM | POA: Diagnosis not present

## 2020-08-14 DIAGNOSIS — R110 Nausea: Secondary | ICD-10-CM | POA: Diagnosis not present

## 2020-08-14 DIAGNOSIS — G4762 Sleep related leg cramps: Secondary | ICD-10-CM | POA: Diagnosis not present

## 2020-08-14 DIAGNOSIS — R519 Headache, unspecified: Secondary | ICD-10-CM | POA: Diagnosis not present

## 2020-08-14 DIAGNOSIS — G5620 Lesion of ulnar nerve, unspecified upper limb: Secondary | ICD-10-CM | POA: Diagnosis not present

## 2020-08-14 DIAGNOSIS — R55 Syncope and collapse: Secondary | ICD-10-CM | POA: Diagnosis not present

## 2020-08-17 ENCOUNTER — Other Ambulatory Visit: Payer: Self-pay | Admitting: Anesthesiology

## 2020-08-17 DIAGNOSIS — M5440 Lumbago with sciatica, unspecified side: Secondary | ICD-10-CM

## 2020-08-17 DIAGNOSIS — M1990 Unspecified osteoarthritis, unspecified site: Secondary | ICD-10-CM

## 2020-08-17 DIAGNOSIS — G894 Chronic pain syndrome: Secondary | ICD-10-CM

## 2020-08-22 NOTE — Telephone Encounter (Signed)
Went back and talked to Judson Roch - NP and gave Verbal up date .  I talked to patient and relayed I have given her all the resources I know of . Relayed to patient if she needs of for  Seizures please call or go to the closest ER or call 911. Patient understood all details and was very thankful.

## 2020-08-22 NOTE — Telephone Encounter (Signed)
Patient called and she relayed she did get approved for a place to live in [facility] but she still could not find a place .   I asked patient about her seizure's and if she was doing ok and if I needed to relay anything  to Judson Roch . She said stated she is tried and her body was getting tired of sleeping in the car. I asked patient if she was taking her RX she stated yes . I asked patient if she has had any seizures she stated no.

## 2020-08-23 DIAGNOSIS — K219 Gastro-esophageal reflux disease without esophagitis: Secondary | ICD-10-CM | POA: Diagnosis not present

## 2020-08-23 DIAGNOSIS — R768 Other specified abnormal immunological findings in serum: Secondary | ICD-10-CM | POA: Diagnosis not present

## 2020-08-23 DIAGNOSIS — M797 Fibromyalgia: Secondary | ICD-10-CM | POA: Diagnosis not present

## 2020-08-23 DIAGNOSIS — R55 Syncope and collapse: Secondary | ICD-10-CM | POA: Diagnosis not present

## 2020-08-23 DIAGNOSIS — R519 Headache, unspecified: Secondary | ICD-10-CM | POA: Diagnosis not present

## 2020-08-23 DIAGNOSIS — R110 Nausea: Secondary | ICD-10-CM | POA: Diagnosis not present

## 2020-08-23 DIAGNOSIS — E042 Nontoxic multinodular goiter: Secondary | ICD-10-CM | POA: Diagnosis not present

## 2020-08-23 DIAGNOSIS — E039 Hypothyroidism, unspecified: Secondary | ICD-10-CM | POA: Diagnosis not present

## 2020-08-23 DIAGNOSIS — R079 Chest pain, unspecified: Secondary | ICD-10-CM | POA: Diagnosis not present

## 2020-08-23 DIAGNOSIS — G4762 Sleep related leg cramps: Secondary | ICD-10-CM | POA: Diagnosis not present

## 2020-08-23 DIAGNOSIS — M159 Polyosteoarthritis, unspecified: Secondary | ICD-10-CM | POA: Diagnosis not present

## 2020-08-23 DIAGNOSIS — R5382 Chronic fatigue, unspecified: Secondary | ICD-10-CM | POA: Diagnosis not present

## 2020-08-23 DIAGNOSIS — G5620 Lesion of ulnar nerve, unspecified upper limb: Secondary | ICD-10-CM | POA: Diagnosis not present

## 2020-08-25 DIAGNOSIS — Z1159 Encounter for screening for other viral diseases: Secondary | ICD-10-CM | POA: Diagnosis not present

## 2020-08-25 DIAGNOSIS — Z1151 Encounter for screening for human papillomavirus (HPV): Secondary | ICD-10-CM | POA: Diagnosis not present

## 2020-08-25 DIAGNOSIS — Z124 Encounter for screening for malignant neoplasm of cervix: Secondary | ICD-10-CM | POA: Diagnosis not present

## 2020-08-25 DIAGNOSIS — N760 Acute vaginitis: Secondary | ICD-10-CM | POA: Diagnosis not present

## 2020-08-25 DIAGNOSIS — N939 Abnormal uterine and vaginal bleeding, unspecified: Secondary | ICD-10-CM | POA: Diagnosis not present

## 2020-08-25 DIAGNOSIS — B373 Candidiasis of vulva and vagina: Secondary | ICD-10-CM | POA: Diagnosis not present

## 2020-08-25 DIAGNOSIS — N898 Other specified noninflammatory disorders of vagina: Secondary | ICD-10-CM | POA: Diagnosis not present

## 2020-08-25 DIAGNOSIS — R87620 Atypical squamous cells of undetermined significance on cytologic smear of vagina (ASC-US): Secondary | ICD-10-CM | POA: Diagnosis not present

## 2020-08-25 DIAGNOSIS — N930 Postcoital and contact bleeding: Secondary | ICD-10-CM | POA: Diagnosis not present

## 2020-08-25 DIAGNOSIS — B9689 Other specified bacterial agents as the cause of diseases classified elsewhere: Secondary | ICD-10-CM | POA: Diagnosis not present

## 2020-09-07 DIAGNOSIS — E042 Nontoxic multinodular goiter: Secondary | ICD-10-CM | POA: Diagnosis not present

## 2020-09-07 DIAGNOSIS — G5620 Lesion of ulnar nerve, unspecified upper limb: Secondary | ICD-10-CM | POA: Diagnosis not present

## 2020-09-07 DIAGNOSIS — R569 Unspecified convulsions: Secondary | ICD-10-CM | POA: Diagnosis not present

## 2020-09-07 DIAGNOSIS — M159 Polyosteoarthritis, unspecified: Secondary | ICD-10-CM | POA: Diagnosis not present

## 2020-09-07 DIAGNOSIS — R55 Syncope and collapse: Secondary | ICD-10-CM | POA: Diagnosis not present

## 2020-09-07 DIAGNOSIS — R110 Nausea: Secondary | ICD-10-CM | POA: Diagnosis not present

## 2020-09-07 DIAGNOSIS — G4762 Sleep related leg cramps: Secondary | ICD-10-CM | POA: Diagnosis not present

## 2020-09-07 DIAGNOSIS — R079 Chest pain, unspecified: Secondary | ICD-10-CM | POA: Diagnosis not present

## 2020-09-07 DIAGNOSIS — K219 Gastro-esophageal reflux disease without esophagitis: Secondary | ICD-10-CM | POA: Diagnosis not present

## 2020-09-07 DIAGNOSIS — R519 Headache, unspecified: Secondary | ICD-10-CM | POA: Diagnosis not present

## 2020-09-07 DIAGNOSIS — R768 Other specified abnormal immunological findings in serum: Secondary | ICD-10-CM | POA: Diagnosis not present

## 2020-09-07 DIAGNOSIS — M797 Fibromyalgia: Secondary | ICD-10-CM | POA: Diagnosis not present

## 2020-09-11 ENCOUNTER — Ambulatory Visit
Admission: RE | Admit: 2020-09-11 | Discharge: 2020-09-11 | Disposition: A | Discharge status: Home / Self Care | Payer: Medicare Other | Source: Ambulatory Visit | Attending: Anesthesiology | Admitting: Anesthesiology

## 2020-09-11 ENCOUNTER — Other Ambulatory Visit: Payer: Self-pay

## 2020-09-11 DIAGNOSIS — R87620 Atypical squamous cells of undetermined significance on cytologic smear of vagina (ASC-US): Secondary | ICD-10-CM

## 2020-09-11 DIAGNOSIS — M7601 Gluteal tendinitis, right hip: Secondary | ICD-10-CM | POA: Diagnosis not present

## 2020-09-11 DIAGNOSIS — M79605 Pain in left leg: Secondary | ICD-10-CM | POA: Diagnosis not present

## 2020-09-11 DIAGNOSIS — M545 Low back pain, unspecified: Secondary | ICD-10-CM | POA: Diagnosis not present

## 2020-09-11 DIAGNOSIS — M5440 Lumbago with sciatica, unspecified side: Secondary | ICD-10-CM

## 2020-09-11 DIAGNOSIS — M79604 Pain in right leg: Secondary | ICD-10-CM | POA: Diagnosis not present

## 2020-09-11 DIAGNOSIS — M48061 Spinal stenosis, lumbar region without neurogenic claudication: Secondary | ICD-10-CM | POA: Diagnosis not present

## 2020-09-11 DIAGNOSIS — R87811 Vaginal high risk human papillomavirus (HPV) DNA test positive: Secondary | ICD-10-CM

## 2020-09-11 DIAGNOSIS — M1990 Unspecified osteoarthritis, unspecified site: Secondary | ICD-10-CM

## 2020-09-11 DIAGNOSIS — G894 Chronic pain syndrome: Secondary | ICD-10-CM

## 2020-09-11 HISTORY — DX: Atypical squamous cells of undetermined significance on cytologic smear of vagina (ASC-US): R87.620

## 2020-09-11 HISTORY — DX: Vaginal high risk human papillomavirus (HPV) DNA test positive: R87.811

## 2020-09-11 IMAGING — MR MR PELVIS W/O CM
4 of 5 series · 28 of 48 positions shown · non-contrast
Comparison: CT abdomen and pelvis [DATE].

CLINICAL DATA: Low back and bilateral lower extremity pain. No
known injury.

EXAM:
MRI PELVIS WITHOUT CONTRAST
TECHNIQUE: Multiplanar multisequence MR imaging of the pelvis was performed. No
intravenous contrast was administered.

[Series 3: STIR · coronal · right · 3.0mm · 1.25mm/px · 11 of 40 slices shown]
[im 1/40]
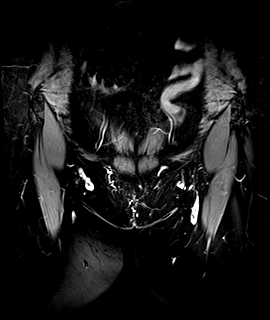
[im 4/40]
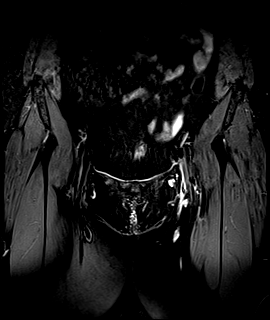
[im 8/40]
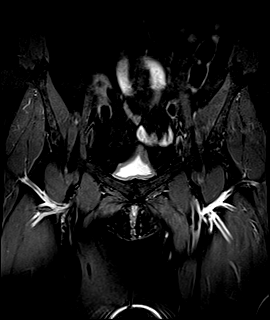
[im 12/40]
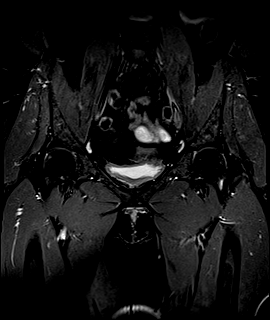
[im 16/40]
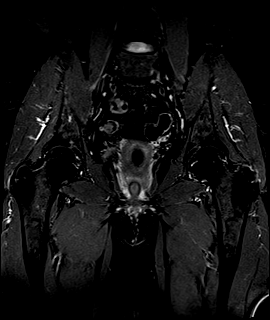
[im 20/40]
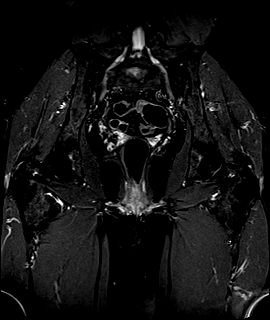
[im 24/40]
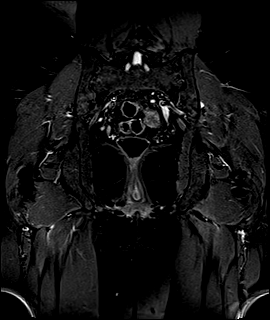
[im 28/40]
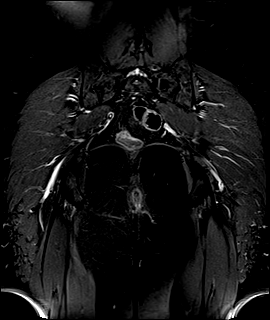
[im 32/40]
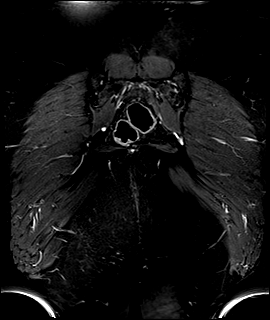
[im 36/40]
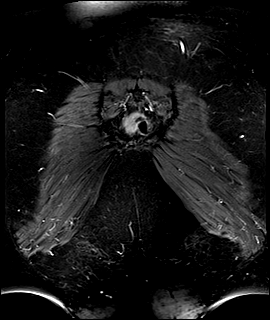
[im 40/40]
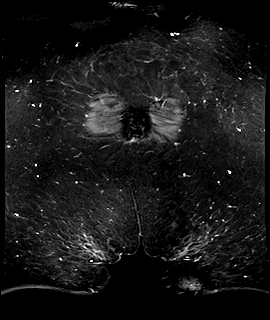

[Series 4: T1 · coronal · right · 3.0mm · 0.78mm/px · 8 of 40 slices shown (1 of 2)]
[im 1/40]
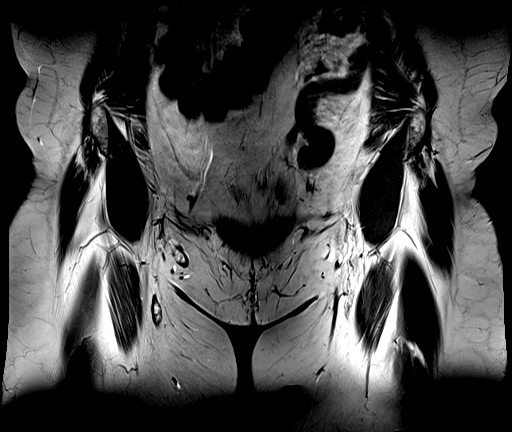
[im 5/40]
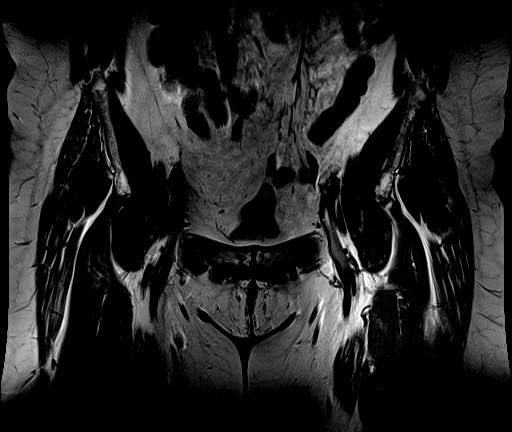
[im 14/40]
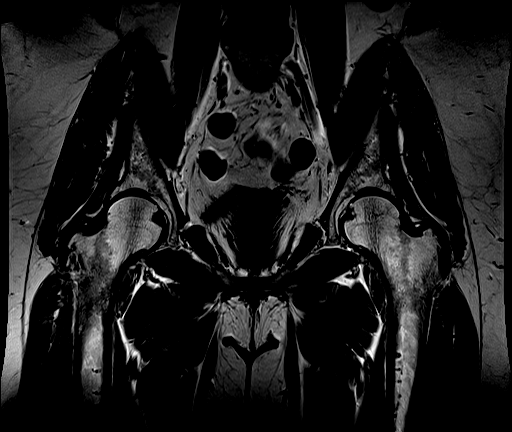
[im 18/40]
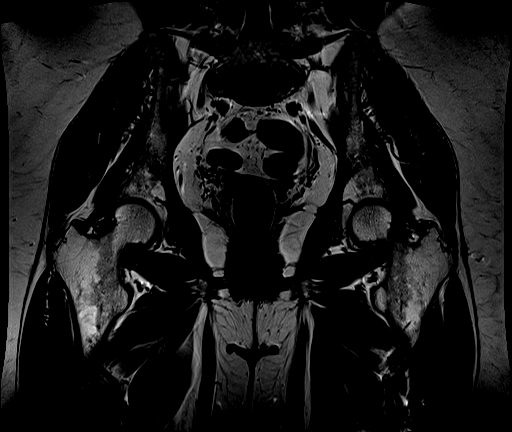
[im 22/40]
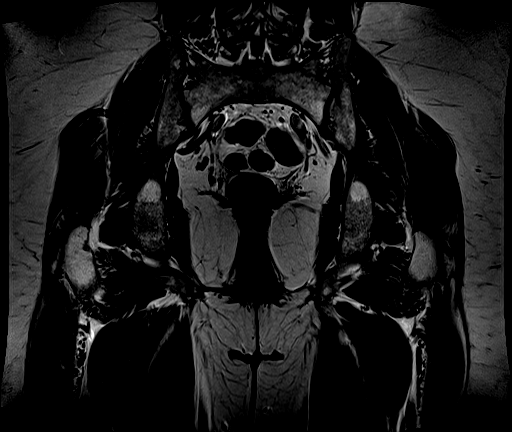
[im 27/40]
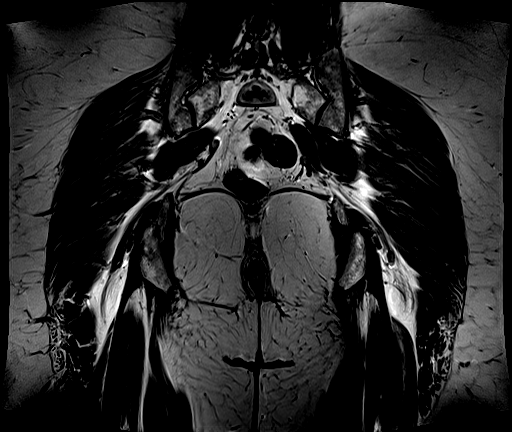
[im 35/40]
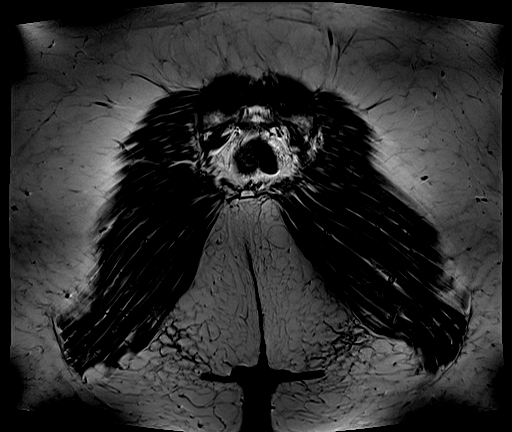
[im 40/40]
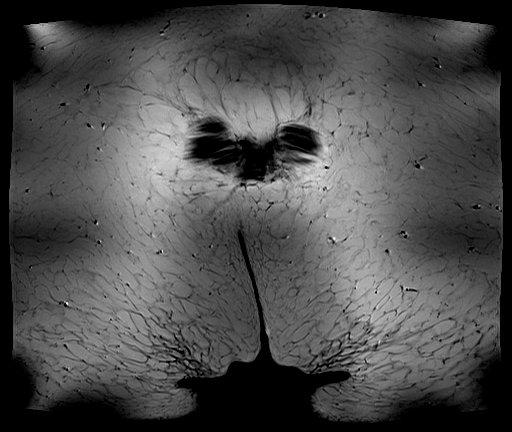

[Series 5: T1 · axial · right · 5.0mm · 0.78mm/px · z∈[-52,+137]mm · 6 of 33 slices shown (2 of 2)]
[im 1/33]
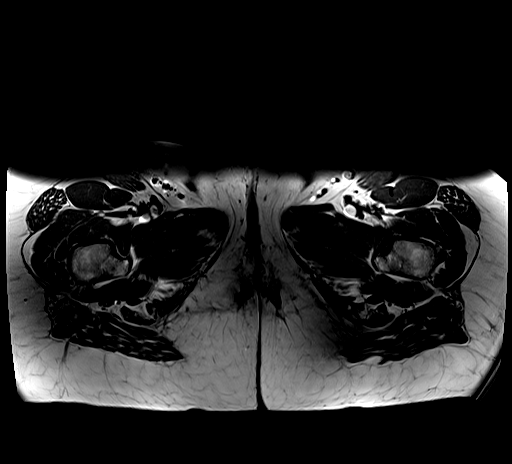
[im 5/33]
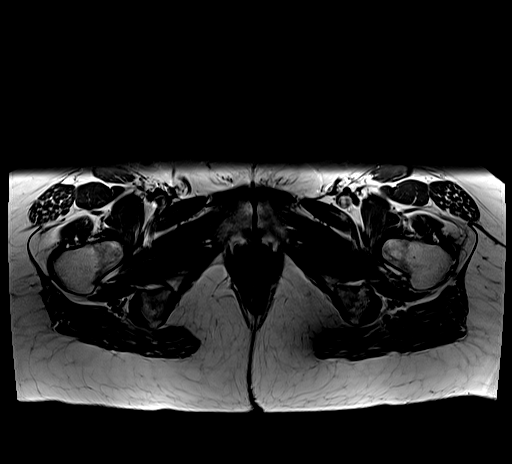
[im 10/33]
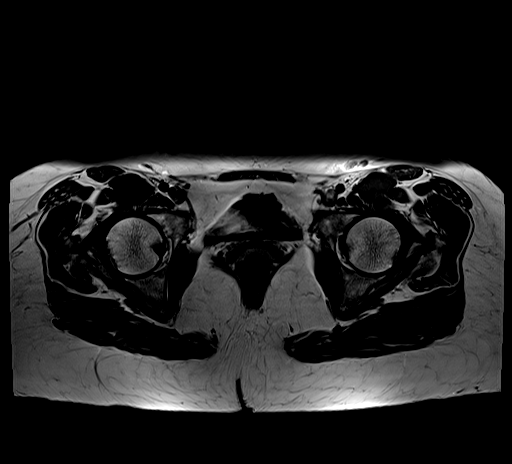
[im 14/33]
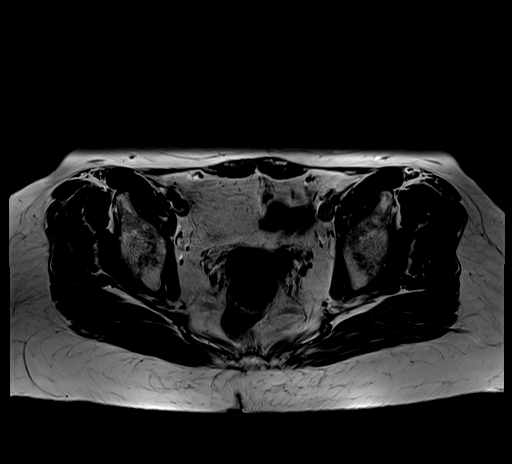
[im 19/33]
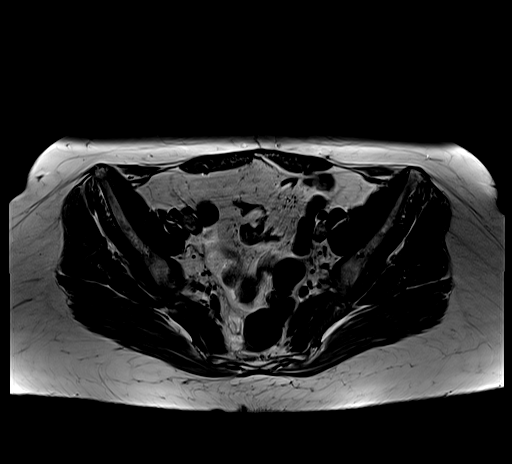
[im 28/33]
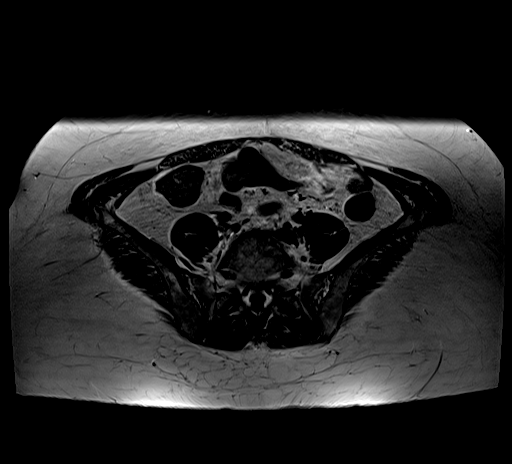

[Series 6: T2 fat-sat · axial · right · 5.0mm · 0.78mm/px · z∈[-14,+126]mm · 3 of 30 slices shown]
[im 5/30]
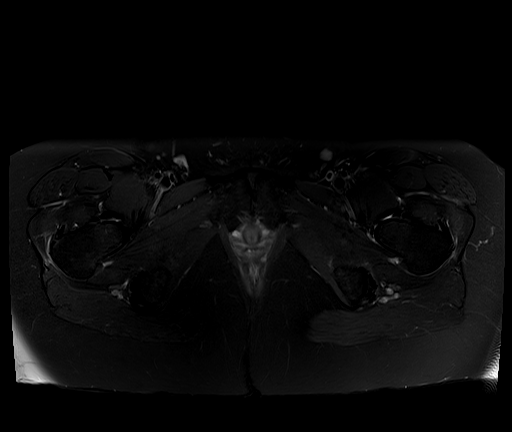
[im 17/30]
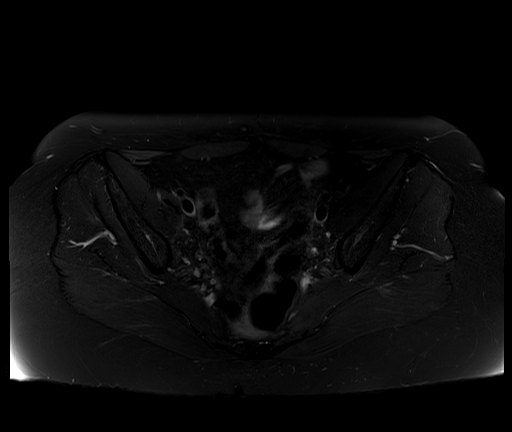
[im 25/30]
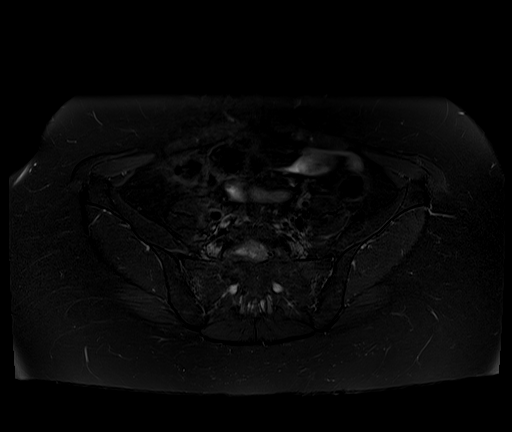

[28 of 48 positions shown; findings below may reference images not displayed]

FINDINGS: Bones/Joint/Cartilage

A small focus of subchondral edema is seen in the right acetabulum
anteriorly. There is also very small synovial herniation pit at the
head neck junction of the right hip. Bone marrow signal is otherwise
normal without fracture, stress change or worrisome lesion. No
avascular necrosis of the femoral heads. Hyaline cartilage surfaces
appear preserved and no labral tear is seen.

Ligaments

Intact.

Muscles and Tendons

There is a small amount of fluid about the right gluteus minimus and
medius tendons and the left gluteus minimus tendon consistent with
tendinopathy. No tendon tear. No muscle atrophy.

Soft tissues

A small volume of fluid is seen in the right trochanteric bursa.
Imaged intrapelvic contents are negative.
IMPRESSION: Right gluteus medius and minimus and left gluteus minimus
tendinopathy without tear.

Small volume of fluid in the right trochanteric bursa suggestive of
bursitis.

## 2020-09-11 IMAGING — MR MR LUMBAR SPINE W/O CM
4 of 8 series · 15 of 48 positions shown · non-contrast
Comparison: MRI of the lumbar spine [DATE].

CLINICAL DATA: Lower back pain with sciatica.

EXAM:
MRI LUMBAR SPINE WITHOUT CONTRAST
TECHNIQUE: Multiplanar, multisequence MR imaging of the lumbar spine was
performed. No intravenous contrast was administered.

[Series 5: T2 · sagittal · 4.0mm · 0.73mm/px · 3 of 15 slices shown (1 of 4)]
[im 1/15]
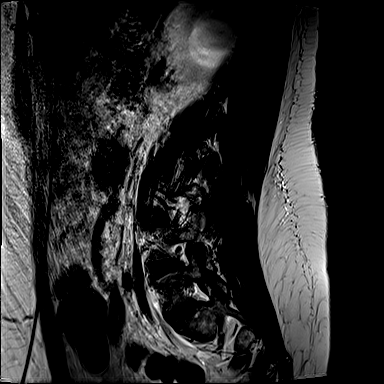
[im 8/15]
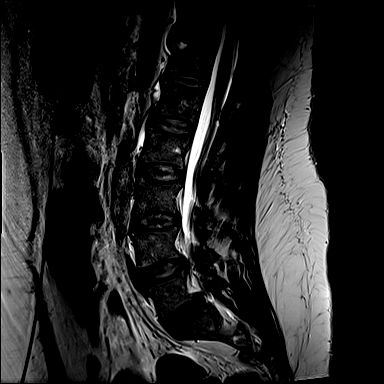
[im 15/15]
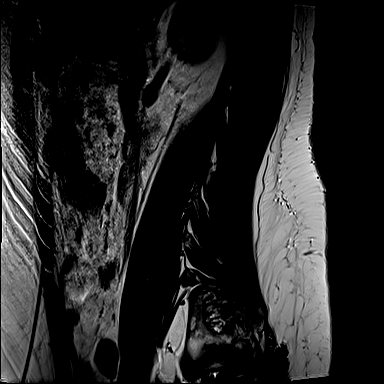

[Series 11: T2 · axial · 4.0mm · 0.28mm/px · z∈[+52,+151]mm · 6 of 21 slices shown (2 of 4)]
[im 1/21]
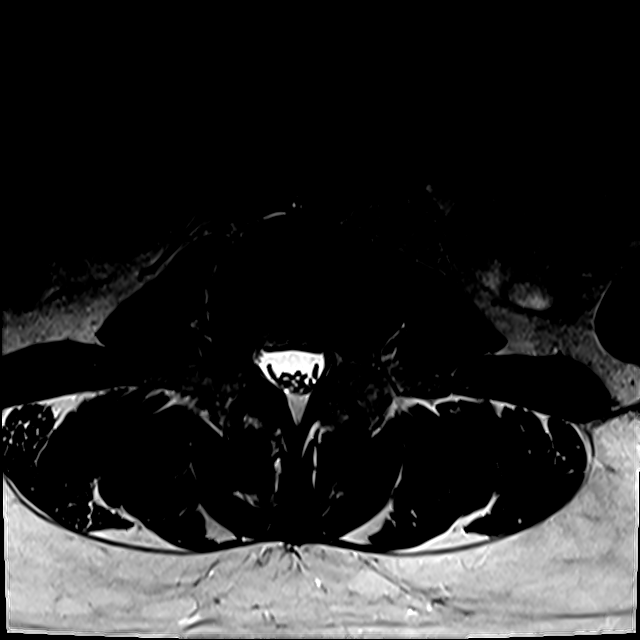
[im 5/21]
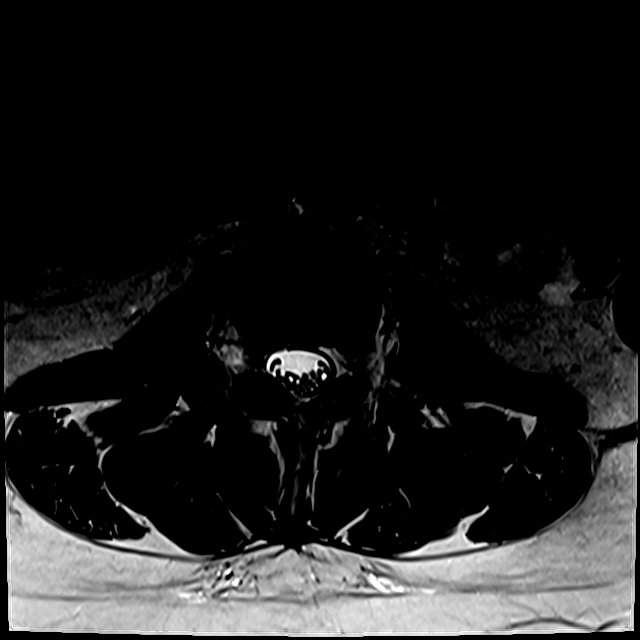
[im 9/21]
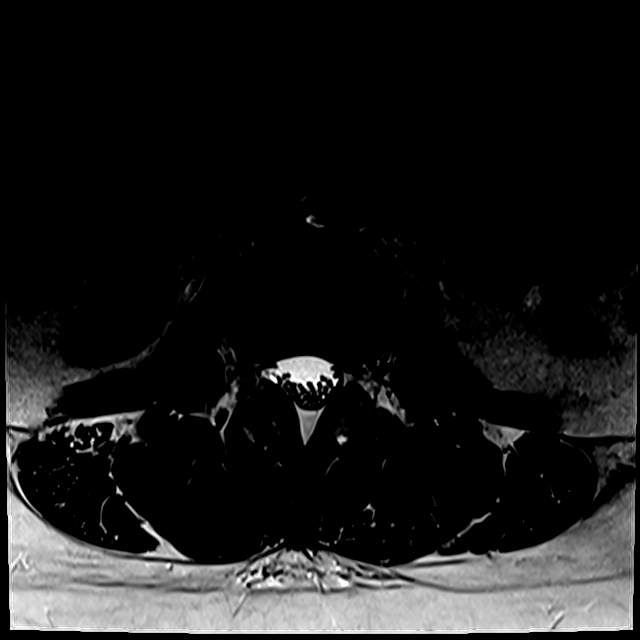
[im 13/21]
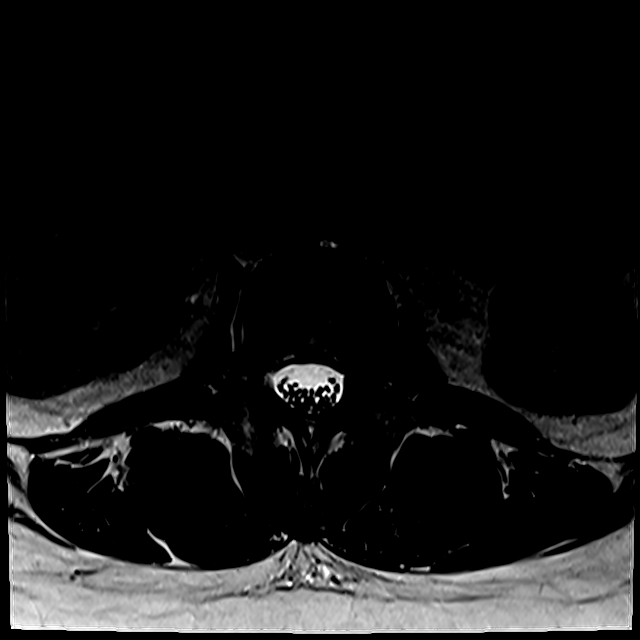
[im 17/21]
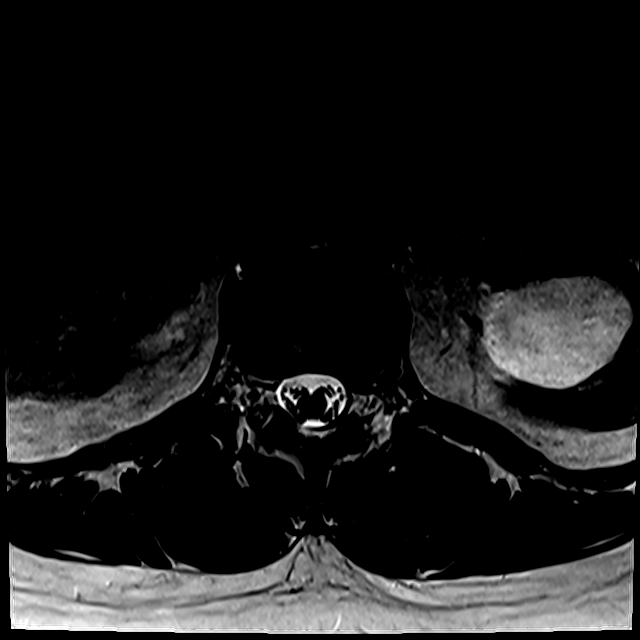
[im 21/21]
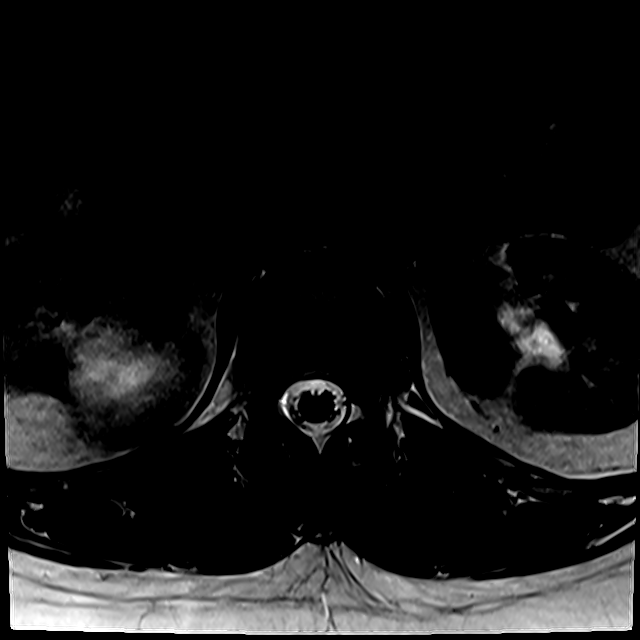

[Series 12: T2 · axial · 4.0mm · 0.28mm/px · z∈[-81,-4]mm · 3 of 25 slices shown (3 of 4)]
[im 5/25]
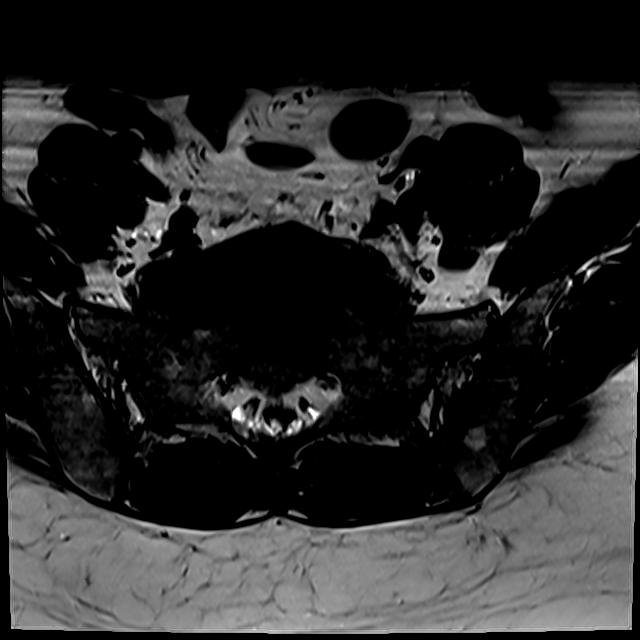
[im 13/25]
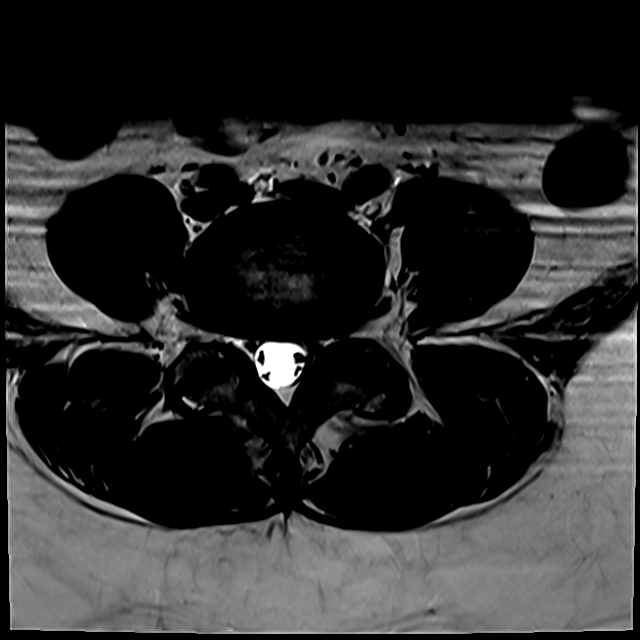
[im 21/25]
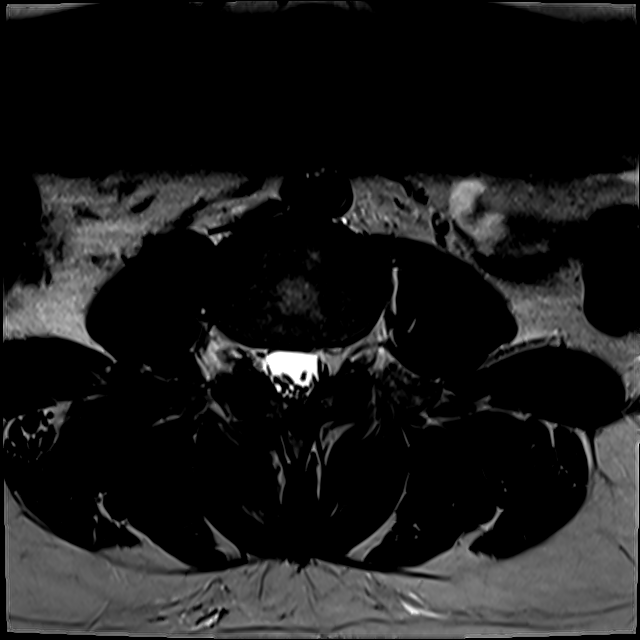

[Series 13: T2 · axial · 4.0mm · 0.28mm/px · z∈[-62,+126]mm · 3 of 46 slices shown (4 of 4)]
[im 9/46]
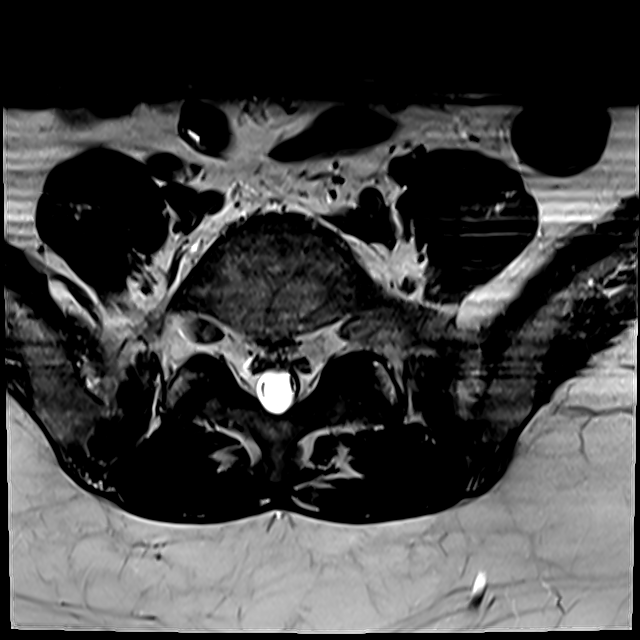
[im 25/46]
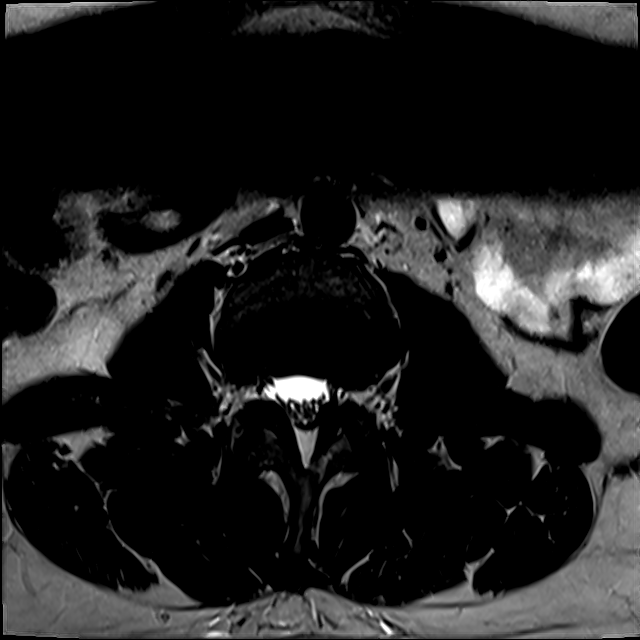
[im 41/46]
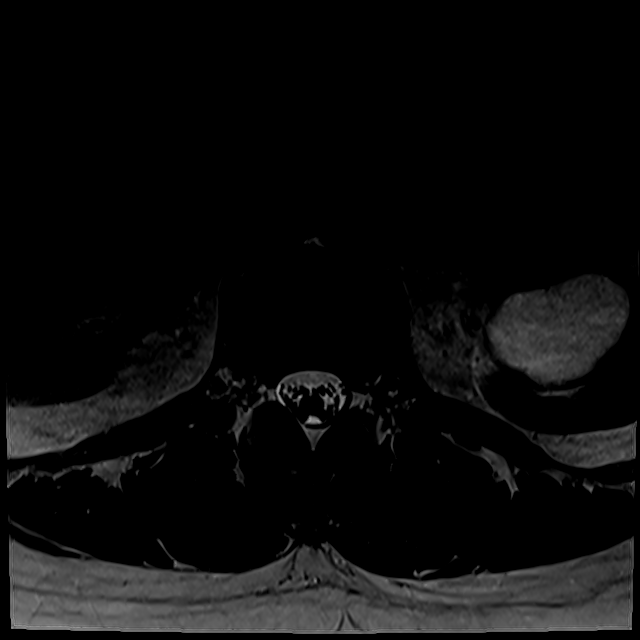

[15 of 48 positions shown; findings below may reference images not displayed]

FINDINGS: Segmentation:  Standard.

Alignment:  Physiologic.

Vertebrae: No fracture, evidence of discitis, or bone lesion. Bone
island noted in the L3 left lamina right septa cell hemiplegia
filled in a, unchanged.

Conus medullaris and cauda equina: Conus extends to the L1 level.
Conus and cauda equina appear normal.

Paraspinal and other soft tissues: Bilateral renal cysts

Disc levels:

T12-L1: No spinal canal or neural foraminal stenosis.

L1-2: No spinal canal or neural foraminal stenosis.

L2-3: No spinal canal or neural foraminal stenosis.

L3-4: Mild facet degenerative changes, progressed from prior. No
spinal canal or neural foraminal stenosis.

L4-5: Mild left asymmetric disc bulge and facet degenerative changes
resulting in mild narrowing of the left subarticular zone. No
significant spinal canal or neural foraminal stenosis. The facet
arthrosis as mildly progressed from prior MRI.

L5-S1: No spinal canal or neural foraminal stenosis.
IMPRESSION: 1. Mild degenerative changes of the lumbar spine with mild narrowing
of the left subarticular zone at L4-L5 which is unchanged. No
significant spinal canal or neural foraminal stenosis at any level.
2. Mild progression of facet arthrosis at L3-4 and L4-5.

## 2020-09-13 DIAGNOSIS — Z0100 Encounter for examination of eyes and vision without abnormal findings: Secondary | ICD-10-CM | POA: Diagnosis not present

## 2020-09-13 DIAGNOSIS — L304 Erythema intertrigo: Secondary | ICD-10-CM | POA: Diagnosis not present

## 2020-09-13 DIAGNOSIS — L501 Idiopathic urticaria: Secondary | ICD-10-CM | POA: Diagnosis not present

## 2020-09-13 DIAGNOSIS — I10 Essential (primary) hypertension: Secondary | ICD-10-CM | POA: Diagnosis not present

## 2020-09-13 DIAGNOSIS — R2241 Localized swelling, mass and lump, right lower limb: Secondary | ICD-10-CM | POA: Diagnosis not present

## 2020-09-13 DIAGNOSIS — D1723 Benign lipomatous neoplasm of skin and subcutaneous tissue of right leg: Secondary | ICD-10-CM | POA: Diagnosis not present

## 2020-09-13 DIAGNOSIS — H43813 Vitreous degeneration, bilateral: Secondary | ICD-10-CM | POA: Diagnosis not present

## 2020-09-13 DIAGNOSIS — L720 Epidermal cyst: Secondary | ICD-10-CM | POA: Diagnosis not present

## 2020-09-17 DIAGNOSIS — Z79891 Long term (current) use of opiate analgesic: Secondary | ICD-10-CM | POA: Diagnosis not present

## 2020-09-17 DIAGNOSIS — G894 Chronic pain syndrome: Secondary | ICD-10-CM | POA: Diagnosis not present

## 2020-09-28 DIAGNOSIS — N760 Acute vaginitis: Secondary | ICD-10-CM | POA: Diagnosis not present

## 2020-09-28 DIAGNOSIS — R87811 Vaginal high risk human papillomavirus (HPV) DNA test positive: Secondary | ICD-10-CM | POA: Diagnosis not present

## 2020-09-28 DIAGNOSIS — N3090 Cystitis, unspecified without hematuria: Secondary | ICD-10-CM | POA: Diagnosis not present

## 2020-09-28 DIAGNOSIS — N939 Abnormal uterine and vaginal bleeding, unspecified: Secondary | ICD-10-CM | POA: Diagnosis not present

## 2020-09-28 DIAGNOSIS — R87620 Atypical squamous cells of undetermined significance on cytologic smear of vagina (ASC-US): Secondary | ICD-10-CM | POA: Diagnosis not present

## 2020-09-28 DIAGNOSIS — B9689 Other specified bacterial agents as the cause of diseases classified elsewhere: Secondary | ICD-10-CM | POA: Diagnosis not present

## 2020-10-16 ENCOUNTER — Telehealth: Payer: Self-pay | Admitting: Gastroenterology

## 2020-10-16 NOTE — Telephone Encounter (Signed)
Spoke to patient who reports LUQ pain over the past few days. No N/V or fever. She denies constipation. She had had a negative work up over the past few months. She will restart her Bentyl twice daily as directed. She was advised to go to the ED if symptoms worsen, appointment scheduled for the next available.

## 2020-10-16 NOTE — Telephone Encounter (Signed)
Pt is requesting a call back from a nurse to discuss her left abdominal pain she has been having since Thanksgiving, pt would like some advice on what to do.

## 2020-11-06 NOTE — Progress Notes (Signed)
Patient ID: Kelly Haas, female   DOB: 10-Nov-1971, 49 y.o.   MRN: [id number]           Chief complaint: Follow-up of thyroid    History of Present Illness:   Baseline history on initial consultation as follows: The patient's thyroid dysfunction was first discovered in 2002 after her pregnancy At that time not clear what her symptoms were but she was told that she needed to be on thyroid medication by an endocrinologist who saw her She did not think she felt any different with taking the thyroid supplement She was being followed for some time and her dose was adjusted finally to taking levothyroxine 75 alternating with 50 mcg  However at some point she stopped taking the supplement because she did not feel any different She will subsequently told by her PCP to start back on the supplements about 7 or 8 years ago because of abnormal blood test but she was not having any symptoms.  Again she did not feel any different with taking the thyroid supplement  RECENT HISTORY:  She has been told to stay off levothyroxine because of low normal or low TSH levels  She does not feel any different compared to her previous visit, still has some fatigue because of insomnia No heat intolerance or shakiness At times she feels either her heart racing or skipping as before No significant weight change Thyroid levels pending  GOITER:  She does not complain of any pressure in her neck sensation or difficulty swallowing She has had occasional hoarseness and has been seen by ENT surgeon  While having a CT scan of her cervical spine she was noted to have multiple nodules measuring up to at least 1.8 cm  Ultrasound of the thyroid showed the following report:  1. Enlarged multinodular goiter  2. Bilateral thyroid nodules, the majority of which are cystic and do not require FNA or follow-up. 3. 1.9 cm spongiform thyroid nodule in the left inferior thyroid gland. This does not meet criteria for follow-up  or FNA. 4. Solid 0.9 cm nodule in the isthmus that does not meet criteria for follow-up or FNA.  Thyroid enlargement present with size of each lobe of about 6.5 cm  Wt Readings from Last 3 Encounters:  11/07/20 226 lb 4 oz (102.6 kg)  08/07/20 222 lb (100.7 kg)  08/02/20 220 lb (99.8 kg)    Thyroid function studies:  Lab Results  Component Value Date   FREET4 1.30 05/08/2020   FREET4 0.98 11/08/2019   FREET4 0.87 09/23/2019   TSH 0.32 (L) 05/08/2020   TSH 0.11 (L) 11/08/2019   TSH 0.23 (L) 09/23/2019   Lab Results  Component Value Date   T3FREE 3.8 05/08/2020   T3FREE 3.1 11/08/2019   T3FREE 3.4 09/23/2019    TSH on 08/23/2019 was 0.31 with free T4 1.22 and free T3 3.96    Allergies as of 11/07/2020      Reactions   Bee Venom Anaphylaxis   Ibuprofen Hives, Nausea And Vomiting, Rash   Other reaction(s): Vomiting (intolerance)   Acetaminophen    Aminolevulinate Derivatives    Aspirin-acetaminophen-caffeine    Dapsone    Other reaction(s): Other (See Comments)   Dimercaprol    Other reaction(s): Other (See Comments)   Methylcellulose    Nitrofuran Derivatives    Phenazopyridine    Primaquine    Toluidine Blue    Celebrex [celecoxib] Hives, Rash   Latex Rash   Oxycodone Rash   Phenergan [promethazine  Hcl] Rash      Medication List       Accurate as of November 07, 2020  8:31 AM. If you have any questions, ask your nurse or doctor.        amLODipine 5 MG tablet Commonly known as: NORVASC Take 5 mg by mouth daily.   aspirin EC 81 MG tablet Take 81 mg by mouth daily.   busPIRone 10 MG tablet Commonly known as: BUSPAR Take 10 mg by mouth 2 (two) times daily.   clonazePAM 0.5 MG tablet Commonly known as: KLONOPIN Take 0.5 mg by mouth 2 (two) times daily as needed for anxiety.   cyclobenzaprine 10 MG tablet Commonly known as: FLEXERIL Take 1 tablet (10 mg total) by mouth 3 (three) times daily as needed for Muscle spasms.   dicyclomine 10 MG  capsule Commonly known as: BENTYL Take 1 capsule (10 mg total) by mouth 2 (two) times daily.   EPINEPHrine 0.3 mg/0.3 mL Soaj injection Commonly known as: EPI-PEN Inject 0.3 mg into the muscle as needed.   folic acid 1 MG tablet Commonly known as: FOLVITE Take 1 mg by mouth daily.   furosemide 20 MG tablet Commonly known as: LASIX Take 20 mg by mouth.   lamoTRIgine 100 MG tablet Commonly known as: LaMICtal Take 1.5 tablets (150 mg total) by mouth 2 (two) times daily.   Lyrica 50 MG capsule Generic drug: pregabalin Take 1 capsule by mouth 2 (two) times daily.   nitroGLYCERIN 0.4 MG SL tablet Commonly known as: NITROSTAT Place 1 tablet (0.4 mg total) under the tongue every 5 (five) minutes as needed for chest pain.   omeprazole 40 MG capsule Commonly known as: PRILOSEC Take 40 mg by mouth daily.   ondansetron 4 MG tablet Commonly known as: Zofran Take 1 tablet (4 mg total) by mouth every 8 (eight) hours as needed for nausea or vomiting.   predniSONE 5 MG tablet Commonly known as: DELTASONE Take 5 mg by mouth as needed.   Xolair 150 MG/ML prefilled syringe Generic drug: omalizumab Inject 150 mg into the skin every 30 (thirty) days. 2 injections   zolpidem 10 MG tablet Commonly known as: AMBIEN Take 10 mg by mouth at bedtime as needed. for sleep       Allergies:  Allergies  Allergen Reactions  . Bee Venom Anaphylaxis  . Ibuprofen Hives, Nausea And Vomiting and Rash    Other reaction(s): Vomiting (intolerance)  . Acetaminophen   . Aminolevulinate Derivatives   . Aspirin-Acetaminophen-Caffeine   . Dapsone     Other reaction(s): Other (See Comments)  . Dimercaprol     Other reaction(s): Other (See Comments)  . Methylcellulose   . Nitrofuran Derivatives   . Phenazopyridine   . Primaquine   . Toluidine Blue   . Celebrex [Celecoxib] Hives and Rash  . Latex Rash  . Oxycodone Rash  . Phenergan [Promethazine Hcl] Rash    Past Medical History:  Diagnosis  Date  . Depression with anxiety   . Fibromyalgia   . G6PD deficiency   . Hypertension   . Kidney stone   . Migraine   . Seizure ([place])   . Seizure disorder ([place]) 11/13/2017  . Thyroid disease     Past Surgical History:  Procedure Laterality Date  . ABDOMINAL HYSTERECTOMY    . APPENDECTOMY    . CHOLECYSTECTOMY    . ESOPHAGOGASTRODUODENOSCOPY  08/29/2017   Mild gastritis. [place] post empiric esophageal dilitation.   . TOE SURGERY Left  removed toe nail  . TONSILLECTOMY AND ADENOIDECTOMY      Family History  Problem Relation Age of Onset  . Hypertension Mother   . Kidney cancer Mother   . Heart attack Father        87  . Seizures Father   . Stroke Maternal Grandmother   . Pancreatic cancer Maternal Grandfather   . Prostate cancer Maternal Grandfather   . Lung cancer Maternal Grandfather   . Thyroid cancer Sister        Patient reported  . Thyroid cancer Maternal Aunt     Social History:  reports that she has never smoked. She has never used smokeless tobacco. She reports that she does not drink alcohol and does not use drugs.   Review of Systems  She is still refusing to take the Covid vaccine   Examination:   BP 124/80   Pulse 79   Ht 5\' 5"  (1.651 m)   Wt 226 lb 4 oz (102.6 kg)   SpO2 99%   BMI 37.65 kg/m      THYROID enlargement on the right side is about 1.5-2 times normal This feels slightly firm but no distinct nodules felt Left lobe just palpable and soft  No lymphadenopathy in the neck Biceps reflexes are slightly brisk No tremor and hands do not feel unusually warm   Assessment/Plan:  Multinodular goiter with mostly small or cystic nodules not requiring further evaluation, last ultrasound in 11/20  She has a mild thyroid enlargement especially in the right side unchanged from before No distinct nodules felt  She also has a family history of multinodular goiter  History of low TSH: She likely has an autonomous thyroid Currently no  symptoms of hyperthyroidism and on exam does not appear hyperthyroid Thyroid functions to be checked today  Encouraged her to get the Covid vaccine Reassured her about the safety  If thyroid levels are normal she can come back annually for follow-up   Elayne Snare 11/07/2020

## 2020-11-07 ENCOUNTER — Encounter: Payer: Self-pay | Admitting: Endocrinology

## 2020-11-07 ENCOUNTER — Other Ambulatory Visit: Payer: Self-pay

## 2020-11-07 ENCOUNTER — Ambulatory Visit (INDEPENDENT_AMBULATORY_CARE_PROVIDER_SITE_OTHER): Payer: Medicare Other | Admitting: Endocrinology

## 2020-11-07 VITALS — BP 124/80 | HR 79 | Ht 65.0 in | Wt 226.2 lb

## 2020-11-07 DIAGNOSIS — R7989 Other specified abnormal findings of blood chemistry: Secondary | ICD-10-CM | POA: Diagnosis not present

## 2020-11-07 DIAGNOSIS — E042 Nontoxic multinodular goiter: Secondary | ICD-10-CM

## 2020-11-07 LAB — T4, FREE: Free T4: 0.76 ng/dL (ref 0.60–1.60)

## 2020-11-07 LAB — TSH: TSH: 0.65 u[IU]/mL (ref 0.35–4.50)

## 2020-11-07 LAB — T3, FREE: T3, Free: 3 pg/mL (ref 2.3–4.2)

## 2020-11-07 NOTE — Progress Notes (Signed)
Please call to let patient know that the lab results are normal and no further action needed

## 2020-11-14 DIAGNOSIS — M797 Fibromyalgia: Secondary | ICD-10-CM | POA: Diagnosis not present

## 2020-11-14 DIAGNOSIS — R519 Headache, unspecified: Secondary | ICD-10-CM | POA: Diagnosis not present

## 2020-11-14 DIAGNOSIS — R768 Other specified abnormal immunological findings in serum: Secondary | ICD-10-CM | POA: Diagnosis not present

## 2020-11-14 DIAGNOSIS — G5620 Lesion of ulnar nerve, unspecified upper limb: Secondary | ICD-10-CM | POA: Diagnosis not present

## 2020-11-14 DIAGNOSIS — E042 Nontoxic multinodular goiter: Secondary | ICD-10-CM | POA: Diagnosis not present

## 2020-11-14 DIAGNOSIS — M159 Polyosteoarthritis, unspecified: Secondary | ICD-10-CM | POA: Diagnosis not present

## 2020-11-14 DIAGNOSIS — R079 Chest pain, unspecified: Secondary | ICD-10-CM | POA: Diagnosis not present

## 2020-11-14 DIAGNOSIS — R110 Nausea: Secondary | ICD-10-CM | POA: Diagnosis not present

## 2020-11-14 DIAGNOSIS — R55 Syncope and collapse: Secondary | ICD-10-CM | POA: Diagnosis not present

## 2020-11-14 DIAGNOSIS — G4762 Sleep related leg cramps: Secondary | ICD-10-CM | POA: Diagnosis not present

## 2020-11-14 DIAGNOSIS — K219 Gastro-esophageal reflux disease without esophagitis: Secondary | ICD-10-CM | POA: Diagnosis not present

## 2020-11-14 DIAGNOSIS — M25512 Pain in left shoulder: Secondary | ICD-10-CM | POA: Diagnosis not present

## 2020-11-23 ENCOUNTER — Ambulatory Visit (INDEPENDENT_AMBULATORY_CARE_PROVIDER_SITE_OTHER): Payer: Medicare Other | Admitting: Gastroenterology

## 2020-11-23 ENCOUNTER — Encounter: Payer: Self-pay | Admitting: Gastroenterology

## 2020-11-23 VITALS — BP 128/80 | HR 86 | Ht 65.0 in | Wt 233.0 lb

## 2020-11-23 DIAGNOSIS — R109 Unspecified abdominal pain: Secondary | ICD-10-CM | POA: Diagnosis not present

## 2020-11-23 DIAGNOSIS — K581 Irritable bowel syndrome with constipation: Secondary | ICD-10-CM | POA: Diagnosis not present

## 2020-11-23 DIAGNOSIS — R1012 Left upper quadrant pain: Secondary | ICD-10-CM | POA: Diagnosis not present

## 2020-11-23 MED ORDER — DICYCLOMINE HCL 10 MG PO CAPS: 10.0000 mg | ORAL_CAPSULE | Freq: Four times a day (QID) | ORAL | 6 refills | Status: DC | PRN

## 2020-11-23 MED ORDER — HYOSCYAMINE SULFATE SL 0.125 MG SL SUBL: 0.1250 mg | SUBLINGUAL_TABLET | Freq: Four times a day (QID) | SUBLINGUAL | 6 refills | Status: DC | PRN

## 2020-11-23 NOTE — Patient Instructions (Signed)
If you are age 49 or older, your body mass index should be between 23-30. Your Body mass index is 38.77 kg/m. If this is out of the aforementioned range listed, please consider follow up with your Primary Care Provider.  If you are age 34 or younger, your body mass index should be between 19-25. Your Body mass index is 38.77 kg/m. If this is out of the aformentioned range listed, please consider follow up with your Primary Care Provider.   We have sent the following medications to your pharmacy for you to pick up at your convenience: Levsin Bentyl  Please purchase the following medications over the counter and take as directed: Fiber Gummy 4 daily.   Follow up 1 year  Thank you,  Dr. Lynann Bologna

## 2020-11-23 NOTE — Progress Notes (Signed)
Chief Complaint: Abdominal pain  Referring Provider:  Simone Curia, MD      ASSESSMENT AND PLAN;   #1. LLQ pain-likely due to IBS  #2. Chronic LUQ and epigastric pain- neg EGD 08/2017 with neg eso bx for EoE. S/p lap chole in the past. Neg solid-phase GES 08/2016, neg CT abdo/pel 05/2018, neg CTA 03/12/2019.  Likely functional dyspepsia/abdominal wall pain.   #3.  IBS-C, neg colon 07/04/2017 except for small posterior anal fissure.  Plan: - Fiber gummy 4/day. Increase water intake. - Continue omeprazole 40 mg p.o. QD - Continue Bentyl 10mg  po qid  prn. #120, 6 refills.  She understands that it may cause worsening constipation. - Add Levsin 0.125mg  S/L Q6hrs prn for severe pain. #60. - FU in 1 year.   HPI:    Kelly Haas is a 50 y.o. female  For follow-up visit  LLQ pain intermittent, sharp. No fever or chills.  In fact she is better overall.  Has been using 2 Gummies per day.  Denies having any significant constipation.  Having BMs 1/every other day.  No melena or hematochezia.  Not have any diverticulosis during last colonoscopy.  No further upper abdominal pain.  Tolerating omeprazole well.  Negative CTA Abdo/pelvis 03/12/2019 was reviewed independently.  Has been working hard on trying to reduce weight and has been able to reduce weight as detailed below.  No melena or hematochezia.  No further nausea/vomiting.  Would like to have medication refill-Bentyl.   Wt Readings from Last 3 Encounters:  11/23/20 233 lb (105.7 kg)  11/07/20 226 lb 4 oz (102.6 kg)  08/07/20 222 lb (100.7 kg)     Past Medical History:  Diagnosis Date  . Depression with anxiety   . Fibromyalgia   . G6PD deficiency   . Hypertension   . Kidney stone   . Migraine   . Seizure ([facility])   . Seizure disorder ([facility]) 11/13/2017  . Thyroid disease     Past Surgical History:  Procedure Laterality Date  . ABDOMINAL HYSTERECTOMY    . APPENDECTOMY    . CHOLECYSTECTOMY    .  ESOPHAGOGASTRODUODENOSCOPY  08/29/2017   Mild gastritis. [place] post empiric esophageal dilitation.   . TOE SURGERY Left    removed toe nail  . TONSILLECTOMY AND ADENOIDECTOMY      Family History  Problem Relation Age of Onset  . Hypertension Mother   . Kidney cancer Mother   . Heart attack Father        33  . Seizures Father   . Stroke Maternal Grandmother   . Pancreatic cancer Maternal Grandfather   . Prostate cancer Maternal Grandfather   . Lung cancer Maternal Grandfather   . Thyroid cancer Sister        Patient reported  . Thyroid cancer Maternal Aunt     Social History   Tobacco Use  . Smoking status: Never Smoker  . Smokeless tobacco: Never Used  Vaping Use  . Vaping Use: Never used  Substance Use Topics  . Alcohol use: No  . Drug use: No    Current Outpatient Medications  Medication Sig Dispense Refill  . amLODipine (NORVASC) 5 MG tablet Take 5 mg by mouth daily.     34 aspirin EC 81 MG tablet Take 81 mg by mouth daily.    Marland Kitchen buPROPion (WELLBUTRIN XL) 150 MG 24 hr tablet Take 150 mg by mouth daily.    . busPIRone (BUSPAR) 15 MG tablet Take 15 mg by  mouth 2 (two) times daily.    . clonazePAM (KLONOPIN) 0.5 MG tablet Take 0.5 mg by mouth 2 (two) times daily as needed for anxiety.    . cyclobenzaprine (FLEXERIL) 10 MG tablet Take 1 tablet (10 mg total) by mouth 3 (three) times daily as needed for Muscle spasms.  3  . dicyclomine (BENTYL) 10 MG capsule Take 1 capsule (10 mg total) by mouth 2 (two) times daily. 60 capsule 6  . EPINEPHrine 0.3 mg/0.3 mL IJ SOAJ injection Inject 0.3 mg into the muscle as needed.     Marland Kitchen escitalopram (LEXAPRO) 20 MG tablet Take 20 mg by mouth daily.    . furosemide (LASIX) 20 MG tablet Take 20 mg by mouth.    . lamoTRIgine (LAMICTAL) 100 MG tablet Take 1.5 tablets (150 mg total) by mouth 2 (two) times daily. 90 tablet 6  . LYRICA 50 MG capsule Take 1 capsule by mouth 2 (two) times daily.   5  . omeprazole (PRILOSEC) 40 MG capsule Take 40  mg by mouth daily.  5  . ondansetron (ZOFRAN) 4 MG tablet Take 1 tablet (4 mg total) by mouth every 8 (eight) hours as needed for nausea or vomiting. 20 tablet 3  . predniSONE (DELTASONE) 5 MG tablet Take 5 mg by mouth as needed.    Arvid Right 150 MG/ML prefilled syringe Inject 150 mg into the skin every 30 (thirty) days. 2 injections    . zolpidem (AMBIEN) 10 MG tablet Take 10 mg by mouth at bedtime as needed. for sleep  0  . nitroGLYCERIN (NITROSTAT) 0.4 MG SL tablet Place 1 tablet (0.4 mg total) under the tongue every 5 (five) minutes as needed for chest pain. (Patient not taking: Reported on 11/07/2020) 25 tablet 2   No current facility-administered medications for this visit.    Allergies  Allergen Reactions  . Bee Venom Anaphylaxis  . Ibuprofen Hives, Nausea And Vomiting and Rash    Other reaction(s): Vomiting (intolerance)  . Acetaminophen   . Aminolevulinate Derivatives   . Aspirin-Acetaminophen-Caffeine   . Dapsone     Other reaction(s): Other (See Comments)  . Dimercaprol     Other reaction(s): Other (See Comments)  . Methylcellulose   . Nitrofuran Derivatives   . Phenazopyridine   . Primaquine   . Toluidine Blue   . Celebrex [Celecoxib] Hives and Rash  . Latex Rash  . Oxycodone Rash  . Phenergan [Promethazine Hcl] Rash    Review of Systems:  Reviewed     Physical Exam:    BP 128/80   Pulse 86   Ht 5\' 5"  (1.651 m)   Wt 233 lb (105.7 kg)   BMI 38.77 kg/m    Gen: awake, alert, NAD HEENT: anicteric, no pallor CV: RRR, no mrg Pulm: CTA b/l Abd: soft, NT/ND, +BS throughout Ext: no c/c/e Neuro: nonfocal   Data Reviewed: I have personally reviewed following labs and imaging studies  CBC: CBC Latest Ref Rng & Units 08/03/2020 06/20/2020 08/17/2019  WBC 4.0 - 10.5 K/uL 11.6(H) 7.4 6.8  Hemoglobin 12.0 - 15.0 g/dL 12.7 13.0 12.6  Hematocrit 36.0 - 46.0 % 39.3 38.7 37.7  Platelets 150 - 400 K/uL 365 356 337    CMP: CMP Latest Ref Rng & Units 08/03/2020  06/20/2020 08/17/2019  Glucose 70 - 99 mg/dL 100(H) 97 99  BUN 6 - 20 mg/dL 11 13 11   Creatinine 0.44 - 1.00 mg/dL 1.03(H) 1.10(H) 0.93  Sodium 135 - 145 mmol/L 140 141 141  Potassium 3.5 - 5.1 mmol/L 3.8 4.4 4.7  Chloride 98 - 111 mmol/L 106 104 105  CO2 22 - 32 mmol/L 25 22 25   Calcium 8.9 - 10.3 mg/dL 9.4 9.4 9.5  Total Protein 6.5 - 8.1 g/dL 7.1 6.5 6.6  Total Bilirubin 0.3 - 1.2 mg/dL 0.5 0.2 0.3  Alkaline Phos 38 - 126 U/L 67 96 103  AST 15 - 41 U/L 18 12 24   ALT 0 - 44 U/L 16 9 42(H)        Carmell Austria, MD 11/23/2020, 4:07 PM  Cc: Cher Nakai, MD

## 2020-11-24 DIAGNOSIS — R55 Syncope and collapse: Secondary | ICD-10-CM | POA: Diagnosis not present

## 2020-11-24 DIAGNOSIS — M797 Fibromyalgia: Secondary | ICD-10-CM | POA: Diagnosis not present

## 2020-11-24 DIAGNOSIS — R768 Other specified abnormal immunological findings in serum: Secondary | ICD-10-CM | POA: Diagnosis not present

## 2020-11-24 DIAGNOSIS — G5620 Lesion of ulnar nerve, unspecified upper limb: Secondary | ICD-10-CM | POA: Diagnosis not present

## 2020-11-24 DIAGNOSIS — M25511 Pain in right shoulder: Secondary | ICD-10-CM | POA: Diagnosis not present

## 2020-11-24 DIAGNOSIS — G4762 Sleep related leg cramps: Secondary | ICD-10-CM | POA: Diagnosis not present

## 2020-11-24 DIAGNOSIS — R519 Headache, unspecified: Secondary | ICD-10-CM | POA: Diagnosis not present

## 2020-11-24 DIAGNOSIS — M159 Polyosteoarthritis, unspecified: Secondary | ICD-10-CM | POA: Diagnosis not present

## 2020-11-24 DIAGNOSIS — R079 Chest pain, unspecified: Secondary | ICD-10-CM | POA: Diagnosis not present

## 2020-11-24 DIAGNOSIS — K219 Gastro-esophageal reflux disease without esophagitis: Secondary | ICD-10-CM | POA: Diagnosis not present

## 2020-11-24 DIAGNOSIS — E042 Nontoxic multinodular goiter: Secondary | ICD-10-CM | POA: Diagnosis not present

## 2020-11-24 DIAGNOSIS — R110 Nausea: Secondary | ICD-10-CM | POA: Diagnosis not present

## 2020-12-07 ENCOUNTER — Encounter: Payer: Self-pay | Admitting: Neurology

## 2020-12-07 ENCOUNTER — Ambulatory Visit (INDEPENDENT_AMBULATORY_CARE_PROVIDER_SITE_OTHER): Payer: Medicare Other | Admitting: Neurology

## 2020-12-07 VITALS — BP 125/80 | HR 83 | Ht 65.5 in | Wt 225.0 lb

## 2020-12-07 DIAGNOSIS — G40909 Epilepsy, unspecified, not intractable, without status epilepticus: Secondary | ICD-10-CM | POA: Diagnosis not present

## 2020-12-07 MED ORDER — LAMOTRIGINE 100 MG PO TABS: 150.0000 mg | ORAL_TABLET | Freq: Two times a day (BID) | ORAL | 6 refills | Status: DC

## 2020-12-07 NOTE — Patient Instructions (Signed)
Check labs today  Stay on Lamictal  Call for any seizure activity  See you back in 6 months

## 2020-12-07 NOTE — Progress Notes (Signed)
PATIENT: Kelly Haas DOB: [birth year]  REASON FOR VISIT: follow up HISTORY FROM: patient  HISTORY OF PRESENT ILLNESS: Today 12/07/20  [name] is a 50 year old female with history of seizures.  She stopped Depakote due to reported hair loss she is on Lamictal.  Last seizure was in September 2021 while taking Lamictal 100 mg twice a day, was supposed to be taking 150 mg twice a day. Currently taking Lamictal 150 mg twice a day.  She continues to be living in her car, sleeps in her car most nights, or stays with friends. 1 night woke up the next morning in he car had chewed her cheek, unclear if seizure but was compliant with medication.  Sleep habits have been disrupted, parks in the [place] parking lot at night.  Is close to getting an apartment working with her Medicaid case worker.  Under a lot of stress in general.  Here today for evaluation unaccompanied.  HISTORY 08/07/2020 SS: [name] is a 50 year old female with history of seizures.  She stopped Depakote due to reported hair loss.  She is on Lamictal supposed to be taking 150 mg twice a day.  Was recently in the ER on 08/02/2020 for questionable seizure, had been feeling nauseated for 2 days prior, drove to the store to buy ginger ale, on the way home, saw bright lights on an emergency vehicle that she felt triggered a seizure, had only been taking Lamictal 100 mg BID.  Does not remember the event, family reported seizure activity, she was lying in the grass outside her daughters apartment, her grandson was in the car.  Was given 5 mg Versed by EMS for anxiety attack, her daughter gave her her EpiPen for possible allergic reaction, reported she was itchy.  No oral injury, but she reports urinary incontinence.  CT head was unremarkable.  Blood work showed WBC 11.6, creatinine 1.03, ethanol negative, troponin negative, UDS positive for benzos.  Has been under significant stress, for the last 2 months, has been sleeping in her car or various  places, has had unusual sleeping patterns.  Unclear etiology of nausea, supposed to be on Bentyl for this, sees GI.  Presents today for evaluation alone.  REVIEW OF SYSTEMS: Out of a complete 14 system review of symptoms, the patient complains only of the following symptoms, and all other reviewed systems are negative.  Seizure ALLERGIES: Allergies  Allergen Reactions  . Bee Venom Anaphylaxis  . Ibuprofen Hives, Nausea And Vomiting and Rash    Other reaction(s): Vomiting (intolerance)  . Acetaminophen   . Aminolevulinate Derivatives   . Aspirin-Acetaminophen-Caffeine   . Dapsone     Other reaction(s): Other (See Comments)  . Dimercaprol     Other reaction(s): Other (See Comments)  . Methylcellulose   . Nitrofuran Derivatives   . Phenazopyridine   . Primaquine   . Toluidine Blue   . Celebrex [Celecoxib] Hives and Rash  . Latex Rash  . Oxycodone Rash  . Phenergan [Promethazine Hcl] Rash    HOME MEDICATIONS: Outpatient Medications Prior to Visit  Medication Sig Dispense Refill  . amLODipine (NORVASC) 5 MG tablet Take 5 mg by mouth daily.     Marland Kitchen aspirin EC 81 MG tablet Take 81 mg by mouth daily.    Marland Kitchen buPROPion (WELLBUTRIN XL) 150 MG 24 hr tablet Take 150 mg by mouth daily.    . busPIRone (BUSPAR) 15 MG tablet Take 15 mg by mouth 2 (two) times daily.    . clonazePAM (  KLONOPIN) 0.5 MG tablet Take 0.5 mg by mouth 2 (two) times daily as needed for anxiety.    . cyclobenzaprine (FLEXERIL) 10 MG tablet Take 1 tablet (10 mg total) by mouth 3 (three) times daily as needed for Muscle spasms.  3  . dicyclomine (BENTYL) 10 MG capsule Take 1 capsule (10 mg total) by mouth 4 (four) times daily as needed for spasms. 120 capsule 6  . EPINEPHrine 0.3 mg/0.3 mL IJ SOAJ injection Inject 0.3 mg into the muscle as needed.     Marland Kitchen escitalopram (LEXAPRO) 20 MG tablet Take 20 mg by mouth daily.    . furosemide (LASIX) 20 MG tablet Take 20 mg by mouth.    . Hyoscyamine Sulfate SL (LEVSIN/SL) 0.125 MG SUBL  Place 0.125 mg under the tongue every 6 (six) hours as needed. 60 tablet 6  . LYRICA 50 MG capsule Take 1 capsule by mouth 2 (two) times daily.   5  . omeprazole (PRILOSEC) 40 MG capsule Take 40 mg by mouth daily.  5  . ondansetron (ZOFRAN) 4 MG tablet Take 1 tablet (4 mg total) by mouth every 8 (eight) hours as needed for nausea or vomiting. 20 tablet 3  . predniSONE (DELTASONE) 5 MG tablet Take 5 mg by mouth as needed.    Arvid Right 150 MG/ML prefilled syringe Inject 150 mg into the skin every 30 (thirty) days. 2 injections    . zolpidem (AMBIEN) 10 MG tablet Take 10 mg by mouth at bedtime as needed. for sleep  0  . lamoTRIgine (LAMICTAL) 100 MG tablet Take 1.5 tablets (150 mg total) by mouth 2 (two) times daily. 90 tablet 6  . nitroGLYCERIN (NITROSTAT) 0.4 MG SL tablet Place 1 tablet (0.4 mg total) under the tongue every 5 (five) minutes as needed for chest pain. (Patient not taking: Reported on 11/07/2020) 25 tablet 2   No facility-administered medications prior to visit.    PAST MEDICAL HISTORY: Past Medical History:  Diagnosis Date  . Depression with anxiety   . Fibromyalgia   . G6PD deficiency   . Hypertension   . Kidney stone   . Migraine   . Seizure ([place])   . Seizure disorder ([place]) 11/13/2017  . Thyroid disease     PAST SURGICAL HISTORY: Past Surgical History:  Procedure Laterality Date  . ABDOMINAL HYSTERECTOMY    . APPENDECTOMY    . CHOLECYSTECTOMY    . ESOPHAGOGASTRODUODENOSCOPY  08/29/2017   Mild gastritis. Stauts post empiric esophageal dilitation.   . TOE SURGERY Left    removed toe nail  . TONSILLECTOMY AND ADENOIDECTOMY      FAMILY HISTORY: Family History  Problem Relation Age of Onset  . Hypertension Mother   . Kidney cancer Mother   . Heart attack Father        54  . Seizures Father   . Stroke Maternal Grandmother   . Pancreatic cancer Maternal Grandfather   . Prostate cancer Maternal Grandfather   . Lung cancer Maternal Grandfather   . Thyroid  cancer Sister        Patient reported  . Thyroid cancer Maternal Aunt     SOCIAL HISTORY: Social History   Socioeconomic History  . Marital status: Single    Spouse name: Not on file  . Number of children: 3  . Years of education: 72  . Highest education level: Associate degree: occupational, Hotel manager, or vocational program  Occupational History  . Occupation: Disabled  Tobacco Use  . Smoking status: Never  Smoker  . Smokeless tobacco: Never Used  Vaping Use  . Vaping Use: Never used  Substance and Sexual Activity  . Alcohol use: No  . Drug use: No  . Sexual activity: Not on file  Other Topics Concern  . Not on file  Social History Narrative   Lives at home with daughter, son and grandson.   Right-handed.   No caffeine use.   Social Determinants of Health   Financial Resource Strain: Not on file  Food Insecurity: Not on file  Transportation Needs: Not on file  Physical Activity: Not on file  Stress: Not on file  Social Connections: Not on file  Intimate Partner Violence: Not on file   PHYSICAL EXAM  Vitals:   12/07/20 1246  BP: 125/80  Pulse: 83  Weight: 225 lb (102.1 kg)  Height: 5' 5.5" (1.664 m)   Body mass index is 36.87 kg/m.  Generalized: Well developed, in no acute distress   Neurological examination  Mentation: Alert oriented to time, place, history taking. Follows all commands speech and language fluent Cranial nerve II-XII: Pupils were equal round reactive to light. Extraocular movements were full, visual field were full on confrontational test. Facial sensation and strength were normal.  Head turning and shoulder shrug  were normal and symmetric. Motor: The motor testing reveals 5 over 5 strength of all 4 extremities. Good symmetric motor tone is noted throughout.  Sensory: Sensory testing is intact to soft touch on all 4 extremities. No evidence of extinction is noted.  Coordination: Cerebellar testing reveals good finger-nose-finger and  heel-to-shin bilaterally.  Gait and station: Gait is normal.  Tandem gait is steady.  Romberg is negative. Reflexes: Deep tendon reflexes are symmetric and normal bilaterally.   DIAGNOSTIC DATA (LABS, IMAGING, TESTING) - I reviewed patient records, labs, notes, testing and imaging myself where available.  Lab Results  Component Value Date   WBC 11.6 (H) 08/03/2020   HGB 12.7 08/03/2020   HCT 39.3 08/03/2020   MCV 94.5 08/03/2020   PLT 365 08/03/2020      Component Value Date/Time   NA 140 08/03/2020 0046   NA 141 06/20/2020 1118   K 3.8 08/03/2020 0046   CL 106 08/03/2020 0046   CO2 25 08/03/2020 0046   GLUCOSE 100 (H) 08/03/2020 0046   BUN 11 08/03/2020 0046   BUN 13 06/20/2020 1118   CREATININE 1.03 (H) 08/03/2020 0046   CALCIUM 9.4 08/03/2020 0046   PROT 7.1 08/03/2020 0046   PROT 6.5 06/20/2020 1118   ALBUMIN 3.8 08/03/2020 0046   ALBUMIN 4.0 06/20/2020 1118   AST 18 08/03/2020 0046   ALT 16 08/03/2020 0046   ALKPHOS 67 08/03/2020 0046   BILITOT 0.5 08/03/2020 0046   BILITOT 0.2 06/20/2020 1118   GFRNONAA >60 08/03/2020 0046   GFRAA >60 08/03/2020 0046   No results found for: CHOL, HDL, LDLCALC, LDLDIRECT, TRIG, CHOLHDL No results found for: HGBA1C No results found for: VITAMINB12 Lab Results  Component Value Date   TSH 0.65 11/07/2020    ASSESSMENT AND PLAN 50 y.o. year old female  has a past medical history of Depression with anxiety, Fibromyalgia, G6PD deficiency, Hypertension, Kidney stone, Migraine, Seizure ([place]), Seizure disorder ([place]) (11/13/2017), and Thyroid disease. here with:  1.  Seizure disorder, recent seizure 08/02/2020 2.  Anxiety, depression  -Continue Lamictal 150 mg twice a day, call for any seizure activity -Check routine labs, Lamictal level, if low will increase -Last known seizure was September 2021, ideally should not  be driving until seizure-free for 6 months, working on getting housing, sleeps in her car, I have offered to help in  any way possible with permanent housing, provide letter to case manager if she has contact info -Call for seizure activity, follow-up in 6 months or sooner if needed  I spent 20 minutes of face-to-face and non-face-to-face time with patient.  This included previsit chart review, lab review, study review, order entry, electronic health record documentation, patient education.  Butler Denmark, AGNP-C, DNP 12/07/2020, 1:08 PM [facility] [address], [place], [place] [address] [phone]

## 2020-12-07 NOTE — Progress Notes (Signed)
I have read the note, and I agree with the clinical assessment and plan.  Floreen Teegarden K Rayvin Abid   

## 2020-12-08 LAB — COMPREHENSIVE METABOLIC PANEL WITH GFR
ALT: 19 IU/L (ref 0–32)
AST: 20 IU/L (ref 0–40)
Albumin/Globulin Ratio: 1.4 (ref 1.2–2.2)
Albumin: 4 g/dL (ref 3.8–4.8)
Alkaline Phosphatase: 86 IU/L (ref 44–121)
BUN/Creatinine Ratio: 11 (ref 9–23)
BUN: 11 mg/dL (ref 6–24)
Bilirubin Total: 0.3 mg/dL (ref 0.0–1.2)
CO2: 23 mmol/L (ref 20–29)
Calcium: 9.6 mg/dL (ref 8.7–10.2)
Chloride: 106 mmol/L (ref 96–106)
Creatinine, Ser: 1.02 mg/dL — ABNORMAL HIGH (ref 0.57–1.00)
GFR calc Af Amer: 75 mL/min/1.73
GFR calc non Af Amer: 65 mL/min/1.73
Globulin, Total: 2.8 g/dL (ref 1.5–4.5)
Glucose: 91 mg/dL (ref 65–99)
Potassium: 4.2 mmol/L (ref 3.5–5.2)
Sodium: 143 mmol/L (ref 134–144)
Total Protein: 6.8 g/dL (ref 6.0–8.5)

## 2020-12-08 LAB — CBC WITH DIFFERENTIAL/PLATELET
Basophils Absolute: 0 10*3/uL (ref 0.0–0.2)
Basos: 0 %
EOS (ABSOLUTE): 0 10*3/uL (ref 0.0–0.4)
Eos: 1 %
Hematocrit: 41.6 % (ref 34.0–46.6)
Hemoglobin: 13.9 g/dL (ref 11.1–15.9)
Immature Grans (Abs): 0 10*3/uL (ref 0.0–0.1)
Immature Granulocytes: 0 %
Lymphocytes Absolute: 2.3 10*3/uL (ref 0.7–3.1)
Lymphs: 33 %
MCH: 30.8 pg (ref 26.6–33.0)
MCHC: 33.4 g/dL (ref 31.5–35.7)
MCV: 92 fL (ref 79–97)
Monocytes Absolute: 0.5 10*3/uL (ref 0.1–0.9)
Monocytes: 7 %
Neutrophils Absolute: 4.1 10*3/uL (ref 1.4–7.0)
Neutrophils: 59 %
Platelets: 363 10*3/uL (ref 150–450)
RBC: 4.52 x10E6/uL (ref 3.77–5.28)
RDW: 11.6 % — ABNORMAL LOW (ref 11.7–15.4)
WBC: 7 10*3/uL (ref 3.4–10.8)

## 2020-12-08 LAB — LAMOTRIGINE LEVEL: Lamotrigine Lvl: 2.5 ug/mL (ref 2.0–20.0)

## 2020-12-11 ENCOUNTER — Telehealth: Payer: Self-pay | Admitting: Neurology

## 2020-12-11 MED ORDER — LAMOTRIGINE 100 MG PO TABS: ORAL_TABLET | ORAL | 6 refills | Status: DC

## 2020-12-11 NOTE — Telephone Encounter (Signed)
Labs returned mildly elevated creatinine 1.02, make sure drinking enough water, Lamictal level is 2.5 which is low normal range. I might suggest increasing Lamictal to 150 mg am/200 mg pm, mentioned possible nocturnal seizure, is sleeping in her car/staying with friends. She is trying to establish housing, I have told her not safe to drive with uncontrolled seizures. Ok with medication increase?

## 2020-12-11 NOTE — Addendum Note (Signed)
Addended by: Suzzanne Cloud on: 12/11/2020 04:08 PM   Modules accepted: Orders

## 2020-12-11 NOTE — Telephone Encounter (Signed)
I called pt and she is taking lamotrigine 150mg  po am and 200mg  po pm.  She has not had any nocturnal sz.  She has been drinking and will keep is up, (water, cranberry juice).  She has found new living accomodations at this point.   She appreciated call.  She verbalized understanding of lab results.

## 2020-12-18 DIAGNOSIS — L501 Idiopathic urticaria: Secondary | ICD-10-CM | POA: Diagnosis not present

## 2021-01-14 DIAGNOSIS — H60501 Unspecified acute noninfective otitis externa, right ear: Secondary | ICD-10-CM | POA: Diagnosis not present

## 2021-01-14 DIAGNOSIS — Z90710 Acquired absence of both cervix and uterus: Secondary | ICD-10-CM | POA: Diagnosis not present

## 2021-01-14 DIAGNOSIS — I517 Cardiomegaly: Secondary | ICD-10-CM | POA: Diagnosis not present

## 2021-01-14 DIAGNOSIS — R0902 Hypoxemia: Secondary | ICD-10-CM | POA: Diagnosis not present

## 2021-01-14 DIAGNOSIS — R55 Syncope and collapse: Secondary | ICD-10-CM | POA: Diagnosis not present

## 2021-01-15 DIAGNOSIS — I517 Cardiomegaly: Secondary | ICD-10-CM | POA: Diagnosis not present

## 2021-01-22 DIAGNOSIS — R55 Syncope and collapse: Secondary | ICD-10-CM | POA: Diagnosis not present

## 2021-01-22 DIAGNOSIS — R110 Nausea: Secondary | ICD-10-CM | POA: Diagnosis not present

## 2021-01-22 DIAGNOSIS — E042 Nontoxic multinodular goiter: Secondary | ICD-10-CM | POA: Diagnosis not present

## 2021-01-22 DIAGNOSIS — G4762 Sleep related leg cramps: Secondary | ICD-10-CM | POA: Diagnosis not present

## 2021-01-22 DIAGNOSIS — M25512 Pain in left shoulder: Secondary | ICD-10-CM | POA: Diagnosis not present

## 2021-01-22 DIAGNOSIS — G5620 Lesion of ulnar nerve, unspecified upper limb: Secondary | ICD-10-CM | POA: Diagnosis not present

## 2021-01-22 DIAGNOSIS — M797 Fibromyalgia: Secondary | ICD-10-CM | POA: Diagnosis not present

## 2021-01-22 DIAGNOSIS — R519 Headache, unspecified: Secondary | ICD-10-CM | POA: Diagnosis not present

## 2021-01-22 DIAGNOSIS — M159 Polyosteoarthritis, unspecified: Secondary | ICD-10-CM | POA: Diagnosis not present

## 2021-01-22 DIAGNOSIS — R079 Chest pain, unspecified: Secondary | ICD-10-CM | POA: Diagnosis not present

## 2021-01-22 DIAGNOSIS — R768 Other specified abnormal immunological findings in serum: Secondary | ICD-10-CM | POA: Diagnosis not present

## 2021-01-22 DIAGNOSIS — K219 Gastro-esophageal reflux disease without esophagitis: Secondary | ICD-10-CM | POA: Diagnosis not present

## 2021-01-30 ENCOUNTER — Telehealth: Payer: Self-pay | Admitting: Gastroenterology

## 2021-01-30 DIAGNOSIS — R112 Nausea with vomiting, unspecified: Secondary | ICD-10-CM | POA: Diagnosis not present

## 2021-01-30 DIAGNOSIS — R197 Diarrhea, unspecified: Secondary | ICD-10-CM | POA: Diagnosis not present

## 2021-01-30 DIAGNOSIS — K760 Fatty (change of) liver, not elsewhere classified: Secondary | ICD-10-CM | POA: Diagnosis not present

## 2021-01-30 DIAGNOSIS — R55 Syncope and collapse: Secondary | ICD-10-CM | POA: Diagnosis not present

## 2021-01-30 DIAGNOSIS — R109 Unspecified abdominal pain: Secondary | ICD-10-CM | POA: Diagnosis not present

## 2021-01-30 DIAGNOSIS — N281 Cyst of kidney, acquired: Secondary | ICD-10-CM | POA: Diagnosis not present

## 2021-01-30 DIAGNOSIS — Z20822 Contact with and (suspected) exposure to covid-19: Secondary | ICD-10-CM | POA: Diagnosis not present

## 2021-01-30 DIAGNOSIS — R1084 Generalized abdominal pain: Secondary | ICD-10-CM | POA: Diagnosis not present

## 2021-01-30 NOTE — Telephone Encounter (Signed)
Pt is requesting a call back from a nurse, pt states she is not able to drink nor eat anything because she starts to experience a burning sensation in her stomach

## 2021-01-30 NOTE — Telephone Encounter (Signed)
Pt states her stomach is just burning. Reports she cannot eat or drink anything, she has taken her bentyl and her omeprazole but it did not stay down. Discussed with pt that if she cannot keep liquids or food down she needs to be seen at the ER as she may need IV fluids to avoid dehydration. Pt verbalized understanding.

## 2021-02-13 ENCOUNTER — Encounter: Payer: Self-pay | Admitting: Neurology

## 2021-02-13 ENCOUNTER — Other Ambulatory Visit: Payer: Self-pay | Admitting: *Deleted

## 2021-02-19 DIAGNOSIS — Z1231 Encounter for screening mammogram for malignant neoplasm of breast: Secondary | ICD-10-CM | POA: Diagnosis not present

## 2021-02-26 DIAGNOSIS — R768 Other specified abnormal immunological findings in serum: Secondary | ICD-10-CM | POA: Diagnosis not present

## 2021-02-26 DIAGNOSIS — E042 Nontoxic multinodular goiter: Secondary | ICD-10-CM | POA: Diagnosis not present

## 2021-02-26 DIAGNOSIS — M25511 Pain in right shoulder: Secondary | ICD-10-CM | POA: Diagnosis not present

## 2021-02-26 DIAGNOSIS — M797 Fibromyalgia: Secondary | ICD-10-CM | POA: Diagnosis not present

## 2021-02-26 DIAGNOSIS — R55 Syncope and collapse: Secondary | ICD-10-CM | POA: Diagnosis not present

## 2021-02-26 DIAGNOSIS — G4762 Sleep related leg cramps: Secondary | ICD-10-CM | POA: Diagnosis not present

## 2021-02-26 DIAGNOSIS — K219 Gastro-esophageal reflux disease without esophagitis: Secondary | ICD-10-CM | POA: Diagnosis not present

## 2021-02-26 DIAGNOSIS — R110 Nausea: Secondary | ICD-10-CM | POA: Diagnosis not present

## 2021-02-26 DIAGNOSIS — R519 Headache, unspecified: Secondary | ICD-10-CM | POA: Diagnosis not present

## 2021-02-26 DIAGNOSIS — R079 Chest pain, unspecified: Secondary | ICD-10-CM | POA: Diagnosis not present

## 2021-02-26 DIAGNOSIS — G5620 Lesion of ulnar nerve, unspecified upper limb: Secondary | ICD-10-CM | POA: Diagnosis not present

## 2021-02-26 DIAGNOSIS — M159 Polyosteoarthritis, unspecified: Secondary | ICD-10-CM | POA: Diagnosis not present

## 2021-03-04 ENCOUNTER — Encounter: Payer: Self-pay | Admitting: Neurology

## 2021-03-04 DIAGNOSIS — Z8669 Personal history of other diseases of the nervous system and sense organs: Secondary | ICD-10-CM | POA: Diagnosis not present

## 2021-03-04 DIAGNOSIS — R9431 Abnormal electrocardiogram [ECG] [EKG]: Secondary | ICD-10-CM | POA: Diagnosis not present

## 2021-03-04 DIAGNOSIS — R519 Headache, unspecified: Secondary | ICD-10-CM | POA: Diagnosis not present

## 2021-03-05 DIAGNOSIS — R55 Syncope and collapse: Secondary | ICD-10-CM | POA: Diagnosis not present

## 2021-03-15 DIAGNOSIS — L501 Idiopathic urticaria: Secondary | ICD-10-CM | POA: Diagnosis not present

## 2021-03-15 DIAGNOSIS — M545 Low back pain, unspecified: Secondary | ICD-10-CM | POA: Diagnosis not present

## 2021-03-15 DIAGNOSIS — M7060 Trochanteric bursitis, unspecified hip: Secondary | ICD-10-CM | POA: Diagnosis not present

## 2021-03-27 DIAGNOSIS — M159 Polyosteoarthritis, unspecified: Secondary | ICD-10-CM | POA: Diagnosis not present

## 2021-03-27 DIAGNOSIS — H6691 Otitis media, unspecified, right ear: Secondary | ICD-10-CM | POA: Diagnosis not present

## 2021-03-27 DIAGNOSIS — G5620 Lesion of ulnar nerve, unspecified upper limb: Secondary | ICD-10-CM | POA: Diagnosis not present

## 2021-03-27 DIAGNOSIS — R55 Syncope and collapse: Secondary | ICD-10-CM | POA: Diagnosis not present

## 2021-03-27 DIAGNOSIS — R079 Chest pain, unspecified: Secondary | ICD-10-CM | POA: Diagnosis not present

## 2021-03-27 DIAGNOSIS — R110 Nausea: Secondary | ICD-10-CM | POA: Diagnosis not present

## 2021-03-27 DIAGNOSIS — R42 Dizziness and giddiness: Secondary | ICD-10-CM | POA: Diagnosis not present

## 2021-03-27 DIAGNOSIS — M797 Fibromyalgia: Secondary | ICD-10-CM | POA: Diagnosis not present

## 2021-03-27 DIAGNOSIS — K219 Gastro-esophageal reflux disease without esophagitis: Secondary | ICD-10-CM | POA: Diagnosis not present

## 2021-03-27 DIAGNOSIS — G4762 Sleep related leg cramps: Secondary | ICD-10-CM | POA: Diagnosis not present

## 2021-03-27 DIAGNOSIS — R519 Headache, unspecified: Secondary | ICD-10-CM | POA: Diagnosis not present

## 2021-03-27 DIAGNOSIS — E042 Nontoxic multinodular goiter: Secondary | ICD-10-CM | POA: Diagnosis not present

## 2021-03-29 DIAGNOSIS — R87620 Atypical squamous cells of undetermined significance on cytologic smear of vagina (ASC-US): Secondary | ICD-10-CM | POA: Diagnosis not present

## 2021-03-29 DIAGNOSIS — Z01419 Encounter for gynecological examination (general) (routine) without abnormal findings: Secondary | ICD-10-CM | POA: Diagnosis not present

## 2021-03-29 DIAGNOSIS — R87811 Vaginal high risk human papillomavirus (HPV) DNA test positive: Secondary | ICD-10-CM | POA: Diagnosis not present

## 2021-03-29 DIAGNOSIS — Z1151 Encounter for screening for human papillomavirus (HPV): Secondary | ICD-10-CM | POA: Diagnosis not present

## 2021-04-06 DIAGNOSIS — I10 Essential (primary) hypertension: Secondary | ICD-10-CM | POA: Diagnosis not present

## 2021-04-06 DIAGNOSIS — R457 State of emotional shock and stress, unspecified: Secondary | ICD-10-CM | POA: Diagnosis not present

## 2021-04-06 DIAGNOSIS — F410 Panic disorder [episodic paroxysmal anxiety] without agoraphobia: Secondary | ICD-10-CM | POA: Diagnosis not present

## 2021-04-06 DIAGNOSIS — R0689 Other abnormalities of breathing: Secondary | ICD-10-CM | POA: Diagnosis not present

## 2021-04-06 DIAGNOSIS — R55 Syncope and collapse: Secondary | ICD-10-CM | POA: Diagnosis not present

## 2021-04-06 DIAGNOSIS — M542 Cervicalgia: Secondary | ICD-10-CM | POA: Diagnosis not present

## 2021-04-07 DIAGNOSIS — R000 Tachycardia, unspecified: Secondary | ICD-10-CM | POA: Diagnosis not present

## 2021-04-10 ENCOUNTER — Ambulatory Visit (INDEPENDENT_AMBULATORY_CARE_PROVIDER_SITE_OTHER): Payer: Medicare Other | Admitting: Endocrinology

## 2021-04-10 ENCOUNTER — Encounter: Payer: Self-pay | Admitting: Endocrinology

## 2021-04-10 ENCOUNTER — Other Ambulatory Visit: Payer: Self-pay

## 2021-04-10 VITALS — BP 128/78 | HR 78 | Ht 65.5 in | Wt 228.2 lb

## 2021-04-10 DIAGNOSIS — E042 Nontoxic multinodular goiter: Secondary | ICD-10-CM | POA: Diagnosis not present

## 2021-04-10 DIAGNOSIS — M542 Cervicalgia: Secondary | ICD-10-CM | POA: Diagnosis not present

## 2021-04-10 NOTE — Progress Notes (Signed)
Patient ID: Kelly Haas, female   DOB: [birth year], 50 y.o.   MRN: [id number]           Chief complaint: Follow-up of thyroid    History of Present Illness:   Baseline history on initial consultation as follows: The patient's thyroid dysfunction was first discovered in 2002 after her pregnancy At that time not clear what her symptoms were but she was told that she needed to be on thyroid medication by an endocrinologist who saw her She did not think she felt any different with taking the thyroid supplement She was being followed for some time and her dose was adjusted finally to taking levothyroxine 75 alternating with 50 mcg  However at some point she stopped taking the supplement because she did not feel any different She will subsequently told by her PCP to start back on the supplements about 7 or 8 years ago because of abnormal blood test but she was not having any symptoms.  Again she did not feel any different with taking the thyroid supplement  RECENT HISTORY:  She has been off levothyroxine supplements for a few years because of tendency to low TSH Last TSH in December 21 was normal  GOITER:  She is coming in today because of complaints of a lump in the right neck and neck discomfort She says she is started about a week or so ago with discomfort with turning her neck Also she thinks she is noticing a lump in the right neck and was concerned about thyroid enlargement No choking or difficulty swallowing  She has had occasional hoarseness previously and has been seen by ENT surgeon  Last ultrasound of the thyroid showed the following report:  1. Enlarged multinodular goiter  2. Bilateral thyroid nodules, the majority of which are cystic and do not require FNA or follow-up. 3. 1.9 cm spongiform thyroid nodule in the left inferior thyroid gland. This does not meet criteria for follow-up or FNA. 4. Solid 0.9 cm nodule in the isthmus that does not meet criteria for follow-up  or FNA. 5.  Thyroid enlargement present with size of each lobe of about 6.5 cm  Wt Readings from Last 3 Encounters:  04/10/21 228 lb 3.2 oz (103.5 kg)  12/07/20 225 lb (102.1 kg)  11/23/20 233 lb (105.7 kg)    Thyroid function studies:  Lab Results  Component Value Date   FREET4 0.76 11/07/2020   FREET4 1.30 05/08/2020   FREET4 0.98 11/08/2019   TSH 0.65 11/07/2020   TSH 0.32 (L) 05/08/2020   TSH 0.11 (L) 11/08/2019   Lab Results  Component Value Date   T3FREE 3.0 11/07/2020   T3FREE 3.8 05/08/2020   T3FREE 3.1 11/08/2019   T3FREE 3.4 09/23/2019    TSH on 08/23/2019 was 0.31 with free T4 1.22 and free T3 3.96    Allergies as of 04/10/2021      Reactions   Bee Venom Anaphylaxis   Ibuprofen Hives, Nausea And Vomiting, Rash   Other reaction(s): Vomiting (intolerance)   Acetaminophen    Aminolevulinate Derivatives    Aspirin-acetaminophen-caffeine    Dapsone    Other reaction(s): Other (See Comments)   Dimercaprol    Other reaction(s): Other (See Comments)   Methylcellulose    Nitrofuran Derivatives    Phenazopyridine    Primaquine    Toluidine Blue    Celebrex [celecoxib] Hives, Rash   Latex Rash   Oxycodone Rash   Phenergan [promethazine Hcl] Rash      Medication  List       Accurate as of Apr 10, 2021  4:47 PM. If you have any questions, ask your nurse or doctor.        STOP taking these medications   aspirin EC 81 MG tablet Stopped by: Elayne Snare, MD   buPROPion 150 MG 24 hr tablet Commonly known as: WELLBUTRIN XL Stopped by: Elayne Snare, MD   busPIRone 15 MG tablet Commonly known as: BUSPAR Stopped by: Elayne Snare, MD   Hyoscyamine Sulfate SL 0.125 MG Subl Commonly known as: Levsin/SL Stopped by: Elayne Snare, MD     TAKE these medications   amLODipine 5 MG tablet Commonly known as: NORVASC Take 5 mg by mouth daily.   clonazePAM 0.5 MG tablet Commonly known as: KLONOPIN Take 0.5 mg by mouth 2 (two) times daily as needed for anxiety.    cyclobenzaprine 10 MG tablet Commonly known as: FLEXERIL Take 1 tablet (10 mg total) by mouth 3 (three) times daily as needed for Muscle spasms.   dicyclomine 10 MG capsule Commonly known as: BENTYL Take 1 capsule (10 mg total) by mouth 4 (four) times daily as needed for spasms.   EPINEPHrine 0.3 mg/0.3 mL Soaj injection Commonly known as: EPI-PEN Inject 0.3 mg into the muscle as needed.   escitalopram 20 MG tablet Commonly known as: LEXAPRO Take 20 mg by mouth daily.   furosemide 20 MG tablet Commonly known as: LASIX Take 20 mg by mouth.   lamoTRIgine 100 MG tablet Commonly known as: LaMICtal Take 1.5 tablets in the morning, take 2 tablets in the evening.   Lyrica 50 MG capsule Generic drug: pregabalin Take 1 capsule by mouth 2 (two) times daily.   nitroGLYCERIN 0.4 MG SL tablet Commonly known as: NITROSTAT Place 1 tablet (0.4 mg total) under the tongue every 5 (five) minutes as needed for chest pain.   omeprazole 40 MG capsule Commonly known as: PRILOSEC Take 40 mg by mouth daily.   ondansetron 4 MG tablet Commonly known as: Zofran Take 1 tablet (4 mg total) by mouth every 8 (eight) hours as needed for nausea or vomiting.   predniSONE 5 MG tablet Commonly known as: DELTASONE Take 5 mg by mouth as needed.   Xolair 150 MG/ML prefilled syringe Generic drug: omalizumab Inject 150 mg into the skin every 30 (thirty) days. 2 injections   zolpidem 10 MG tablet Commonly known as: AMBIEN Take 10 mg by mouth at bedtime as needed. for sleep       Allergies:  Allergies  Allergen Reactions  . Bee Venom Anaphylaxis  . Ibuprofen Hives, Nausea And Vomiting and Rash    Other reaction(s): Vomiting (intolerance)  . Acetaminophen   . Aminolevulinate Derivatives   . Aspirin-Acetaminophen-Caffeine   . Dapsone     Other reaction(s): Other (See Comments)  . Dimercaprol     Other reaction(s): Other (See Comments)  . Methylcellulose   . Nitrofuran Derivatives   .  Phenazopyridine   . Primaquine   . Toluidine Blue   . Celebrex [Celecoxib] Hives and Rash  . Latex Rash  . Oxycodone Rash  . Phenergan [Promethazine Hcl] Rash    Past Medical History:  Diagnosis Date  . Depression with anxiety   . Fibromyalgia   . G6PD deficiency   . Hypertension   . Kidney stone   . Migraine   . Seizure ([place])   . Seizure disorder ([place]) 11/13/2017  . Thyroid disease     Past Surgical History:  Procedure Laterality Date  .  ABDOMINAL HYSTERECTOMY    . APPENDECTOMY    . CHOLECYSTECTOMY    . ESOPHAGOGASTRODUODENOSCOPY  08/29/2017   Mild gastritis. Stauts post empiric esophageal dilitation.   . TOE SURGERY Left    removed toe nail  . TONSILLECTOMY AND ADENOIDECTOMY      Family History  Problem Relation Age of Onset  . Hypertension Mother   . Kidney cancer Mother   . Heart attack Father        73  . Seizures Father   . Stroke Maternal Grandmother   . Pancreatic cancer Maternal Grandfather   . Prostate cancer Maternal Grandfather   . Lung cancer Maternal Grandfather   . Thyroid cancer Sister        Patient reported  . Thyroid cancer Maternal Aunt     Social History:  reports that she has never smoked. She has never used smokeless tobacco. She reports that she does not drink alcohol and does not use drugs.   Review of Systems     Examination:   BP 128/78 (BP Location: Left Arm, Patient Position: Sitting, Cuff Size: Normal)   Pulse 78   Ht 5' 5.5" (1.664 m)   Wt 228 lb 3.2 oz (103.5 kg)   SpO2 98%   BMI 37.40 kg/m      THYROID exam shows the following Right lobe is soft to firm and is about 1.5-2 times normal, no distinct nodules felt Left lobe about 1-1/2 times normal smooth and soft  She has a mild prominence of the lower right sternomastoid but there is no distinct mass or nodule palpable and this area is separate from the thyroid which moves with swallowing Minimal tenderness present  No lymphadenopathy in the  neck   Assessment/Plan:  Right lower neck swelling and discomfort: This appears to be more of a sternomastoid spasm without any mass palpable  Multinodular goiter with mostly small or cystic nodules as seen on last ultrasound in 11/20 On exam her thyroid enlargement is unchanged from before and is mostly mild enlargement of the right side No nodules felt on exam of the thyroid  Reassured her that there is no change in her thyroid exam and her symptoms are not related to any change in her thyroid She will discuss her neck pain and right lower neck fullness with her PCP Thyroid function will be checked   Elayne Snare 04/10/2021

## 2021-04-11 DIAGNOSIS — R079 Chest pain, unspecified: Secondary | ICD-10-CM | POA: Diagnosis not present

## 2021-04-11 DIAGNOSIS — F419 Anxiety disorder, unspecified: Secondary | ICD-10-CM | POA: Diagnosis not present

## 2021-04-11 DIAGNOSIS — R519 Headache, unspecified: Secondary | ICD-10-CM | POA: Diagnosis not present

## 2021-04-11 DIAGNOSIS — B373 Candidiasis of vulva and vagina: Secondary | ICD-10-CM | POA: Diagnosis not present

## 2021-04-11 DIAGNOSIS — M797 Fibromyalgia: Secondary | ICD-10-CM | POA: Diagnosis not present

## 2021-04-11 DIAGNOSIS — K219 Gastro-esophageal reflux disease without esophagitis: Secondary | ICD-10-CM | POA: Diagnosis not present

## 2021-04-11 DIAGNOSIS — G4762 Sleep related leg cramps: Secondary | ICD-10-CM | POA: Diagnosis not present

## 2021-04-11 DIAGNOSIS — R110 Nausea: Secondary | ICD-10-CM | POA: Diagnosis not present

## 2021-04-11 DIAGNOSIS — G5620 Lesion of ulnar nerve, unspecified upper limb: Secondary | ICD-10-CM | POA: Diagnosis not present

## 2021-04-11 DIAGNOSIS — R55 Syncope and collapse: Secondary | ICD-10-CM | POA: Diagnosis not present

## 2021-04-11 DIAGNOSIS — E042 Nontoxic multinodular goiter: Secondary | ICD-10-CM | POA: Diagnosis not present

## 2021-04-11 DIAGNOSIS — M159 Polyosteoarthritis, unspecified: Secondary | ICD-10-CM | POA: Diagnosis not present

## 2021-04-11 LAB — T3, FREE: T3, Free: 2.9 pg/mL (ref 2.3–4.2)

## 2021-04-11 LAB — TSH: TSH: 0.06 u[IU]/mL — ABNORMAL LOW (ref 0.35–4.50)

## 2021-04-11 LAB — T4, FREE: Free T4: 0.91 ng/dL (ref 0.60–1.60)

## 2021-04-12 NOTE — Progress Notes (Signed)
One of the thyroid tests indicates mild increase in thyroid activity but overall not in the overactive thyroid range, no treatment at this time needed  Recommend rechecking in August and need to change the appointment

## 2021-04-19 ENCOUNTER — Encounter: Payer: Self-pay | Admitting: Cardiology

## 2021-04-19 ENCOUNTER — Ambulatory Visit (INDEPENDENT_AMBULATORY_CARE_PROVIDER_SITE_OTHER): Payer: Medicare Other | Admitting: Cardiology

## 2021-04-19 ENCOUNTER — Other Ambulatory Visit: Payer: Self-pay

## 2021-04-19 DIAGNOSIS — I10 Essential (primary) hypertension: Secondary | ICD-10-CM | POA: Insufficient documentation

## 2021-04-19 DIAGNOSIS — Z79899 Other long term (current) drug therapy: Secondary | ICD-10-CM | POA: Diagnosis not present

## 2021-04-19 DIAGNOSIS — E669 Obesity, unspecified: Secondary | ICD-10-CM

## 2021-04-19 DIAGNOSIS — R011 Cardiac murmur, unspecified: Secondary | ICD-10-CM

## 2021-04-19 DIAGNOSIS — R6882 Decreased libido: Secondary | ICD-10-CM | POA: Diagnosis not present

## 2021-04-19 HISTORY — DX: Cardiac murmur, unspecified: R01.1

## 2021-04-19 HISTORY — DX: Essential (primary) hypertension: I10

## 2021-04-19 HISTORY — DX: Obesity, unspecified: E66.9

## 2021-04-19 NOTE — Patient Instructions (Signed)
Medication Instructions:  No medication changes. *If you need a refill on your cardiac medications before your next appointment, please call your pharmacy*   Lab Work: None ordered If you have labs (blood work) drawn today and your tests are completely normal, you will receive your results only by: Marland Kitchen MyChart Message (if you have MyChart) OR . A paper copy in the mail If you have any lab test that is abnormal or we need to change your treatment, we will call you to review the results.   Testing/Procedures:  We will order CT coronary calcium score. It will cost $99.00 and is not covered by insurance.  Please call [phone] to schedule.   [facility]  [address] [place], [place] [address]  Your physician has requested that you have an echocardiogram. Echocardiography is a painless test that uses sound waves to create images of your heart. It provides your doctor with information about the size and shape of your heart and how well your heart's chambers and valves are working. This procedure takes approximately one hour. There are no restrictions for this procedure.    Follow-Up: At [facility], you and your health needs are our priority.  As part of our continuing mission to provide you with exceptional heart care, we have created designated Provider Care Teams.  These Care Teams include your primary Cardiologist (physician) and Advanced Practice Providers (APPs -  Physician Assistants and Nurse Practitioners) who all work together to provide you with the care you need, when you need it.  We recommend signing up for the patient portal called "MyChart".  Sign up information is provided on this After Visit Summary.  MyChart is used to connect with patients for Virtual Visits (Telemedicine).  Patients are able to view lab/test results, encounter notes, upcoming appointments, etc.  Non-urgent messages can be sent to your provider as well.   To learn more about what you  can do with MyChart, go to [email or web address].    Your next appointment:   2 month(s)  The format for your next appointment:   In Person  Provider:   Jyl Heinz, MD   Other Instructions  Coronary Calcium Scan A coronary calcium scan is an imaging test used to look for deposits of plaque in the inner lining of the blood vessels of the heart (coronary arteries). Plaque is made up of calcium, protein, and fatty substances. These deposits of plaque can partly clog and narrow the coronary arteries without producing any symptoms or warning signs. This puts a person at risk for a heart attack. This test is recommended for people who are at moderate risk for heart disease. The test can find plaque deposits before symptoms develop. Tell a health care provider about:  Any allergies you have.  All medicines you are taking, including vitamins, herbs, eye drops, creams, and over-the-counter medicines.  Any problems you or family members have had with anesthetic medicines.  Any blood disorders you have.  Any surgeries you have had.  Any medical conditions you have.  Whether you are pregnant or may be pregnant. What are the risks? Generally, this is a safe procedure. However, problems may occur, including:  Harm to a pregnant woman and her unborn baby. This test involves the use of radiation. Radiation exposure can be dangerous to a pregnant woman and her unborn baby. If you are pregnant or think you may be pregnant, you should not have this procedure done.  Slight increase in the risk  of cancer. This is because of the radiation involved in the test. What happens before the procedure? Ask your health care provider for any specific instructions on how to prepare for this procedure. You may be asked to avoid products that contain caffeine, tobacco, or nicotine for 4 hours before the procedure. What happens during the procedure?  You will undress and remove any jewelry from your neck  or chest.  You will put on a hospital gown.  Sticky electrodes will be placed on your chest. The electrodes will be connected to an electrocardiogram (ECG) machine to record a tracing of the electrical activity of your heart.  You will lie down on a curved bed that is attached to the [place].  You may be given medicine to slow down your heart rate so that clear pictures can be created.  You will be moved into the CT scanner, and the CT scanner will take pictures of your heart. During this time, you will be asked to lie still and hold your breath for 2-3 seconds at a time while each picture of your heart is being taken. The procedure may vary among health care providers and hospitals.    What happens after the procedure?  You can get dressed.  You can return to your normal activities.  It is up to you to get the results of your procedure. Ask your health care provider, or the department that is doing the procedure, when your results will be ready. Summary  A coronary calcium scan is an imaging test used to look for deposits of plaque in the inner lining of the blood vessels of the heart (coronary arteries). Plaque is made up of calcium, protein, and fatty substances.  Generally, this is a safe procedure. Tell your health care provider if you are pregnant or may be pregnant.  Ask your health care provider for any specific instructions on how to prepare for this procedure.  A CT scanner will take pictures of your heart.  You can return to your normal activities after the scan is done. This information is not intended to replace advice given to you by your health care provider. Make sure you discuss any questions you have with your health care provider. Document Revised: 05/25/2019 Document Reviewed: 05/25/2019 Elsevier Patient Education  2021 [employer].   Echocardiogram An echocardiogram is a test that uses sound waves (ultrasound) to produce images of the heart. Images from  an echocardiogram can provide important information about:  Heart size and shape.  The size and thickness and movement of your heart's walls.  Heart muscle function and strength.  Heart valve function or if you have stenosis. Stenosis is when the heart valves are too narrow.  If blood is flowing backward through the heart valves (regurgitation).  A tumor or infectious growth around the heart valves.  Areas of heart muscle that are not working well because of poor blood flow or injury from a heart attack.  Aneurysm detection. An aneurysm is a weak or damaged part of an artery wall. The wall bulges out from the normal force of blood pumping through the body. Tell a health care provider about:  Any allergies you have.  All medicines you are taking, including vitamins, herbs, eye drops, creams, and over-the-counter medicines.  Any blood disorders you have.  Any surgeries you have had.  Any medical conditions you have.  Whether you are pregnant or may be pregnant. What are the risks? Generally, this is a safe test.  However, problems may occur, including an allergic reaction to dye (contrast) that may be used during the test. What happens before the test? No specific preparation is needed. You may eat and drink normally. What happens during the test?  You will take off your clothes from the waist up and put on a hospital gown.  Electrodes or electrocardiogram (ECG)patches may be placed on your chest. The electrodes or patches are then connected to a device that monitors your heart rate and rhythm.  You will lie down on a table for an ultrasound exam. A gel will be applied to your chest to help sound waves pass through your skin.  A handheld device, called a transducer, will be pressed against your chest and moved over your heart. The transducer produces sound waves that travel to your heart and bounce back (or "echo" back) to the transducer. These sound waves will be captured in  real-time and changed into images of your heart that can be viewed on a video monitor. The images will be recorded on a computer and reviewed by your health care provider.  You may be asked to change positions or hold your breath for a short time. This makes it easier to get different views or better views of your heart.  In some cases, you may receive contrast through an IV in one of your veins. This can improve the quality of the pictures from your heart. The procedure may vary among health care providers and hospitals.   What can I expect after the test? You may return to your normal, everyday life, including diet, activities, and medicines, unless your health care provider tells you not to do that. Follow these instructions at home:  It is up to you to get the results of your test. Ask your health care provider, or the department that is doing the test, when your results will be ready.  Keep all follow-up visits. This is important. Summary  An echocardiogram is a test that uses sound waves (ultrasound) to produce images of the heart.  Images from an echocardiogram can provide important information about the size and shape of your heart, heart muscle function, heart valve function, and other possible heart problems.  You do not need to do anything to prepare before this test. You may eat and drink normally.  After the echocardiogram is completed, you may return to your normal, everyday life, unless your health care provider tells you not to do that. This information is not intended to replace advice given to you by your health care provider. Make sure you discuss any questions you have with your health care provider. Document Revised: 06/27/2020 Document Reviewed: 06/27/2020 Elsevier Patient Education  2021 [employer].

## 2021-04-19 NOTE — Progress Notes (Signed)
Cardiology Office Note:    Date:  04/19/2021   ID:  Kelly Haas, DOB 03-Jul-1971, MRN [id number]  PCP:  Cher Nakai, MD  Cardiologist:  Jenean Lindau, MD   Referring MD: Cher Nakai, MD    ASSESSMENT:    1. Essential hypertension   2. Cardiac murmur   3. Obesity (BMI 35.0-39.9 without comorbidity)    PLAN:    In order of problems listed above:  1. Primary prevention stressed with the patient.  Importance of compliance with diet medication stressed and she vocalized understanding. 2. Essential hypertension: Blood pressure stable and diet was emphasized.  Lifestyle modification and exercise stressed. 3. Cardiac murmur: Echocardiogram will be done to assess murmur heard on auscultation. 4. RISK stratification for coronary heart disease: Patient is concerned about it and we discussed calcium scoring with CT scan and she is agreeable.  I told her to walk at least half an hour a day 5 days a week and she promises to do so.  Lipids followed by primary care. 5. Patient will be seen in follow-up appointment in 6 months or earlier if the patient has any concerns    Medication Adjustments/Labs and Tests Ordered: Current medicines are reviewed at length with the patient today.  Concerns regarding medicines are outlined above.  No orders of the defined types were placed in this encounter.  No orders of the defined types were placed in this encounter.    History of Present Illness:    Kelly Haas is a 50 y.o. female who is being seen today for the evaluation of cardiac risk stratification at the request of Cher Nakai, MD.  Patient is a pleasant 50 year old female.  She has past medical history of multiple syncopal attacks in the very remote past.  She has not had this issue recently.  She mentions to me that her mother has coronary artery disease and her daughter also has diagnosed with some cardiac problem that she is more clear about and she wanted to be evaluated.  Overall she leads  a sedentary lifestyle and is overweight.  At the time of my evaluation, the patient is alert awake oriented and in no distress.  She denies any chest pain orthopnea or PND.  Past Medical History:  Diagnosis Date  . Depression with anxiety   . Fibromyalgia   . G6PD deficiency   . Hypertension   . Kidney stone   . Migraine   . Seizure ([place])   . Seizure disorder ([place]) 11/13/2017  . Thyroid disease     Past Surgical History:  Procedure Laterality Date  . ABDOMINAL HYSTERECTOMY    . APPENDECTOMY    . CHOLECYSTECTOMY    . ESOPHAGOGASTRODUODENOSCOPY  08/29/2017   Mild gastritis. [place]s post empiric esophageal dilitation.   . TOE SURGERY Left    removed toe nail  . TONSILLECTOMY AND ADENOIDECTOMY      Current Medications: Current Meds  Medication Sig  . amLODipine (NORVASC) 5 MG tablet Take 5 mg by mouth daily.   Marland Kitchen buPROPion (WELLBUTRIN XL) 300 MG 24 hr tablet Take 300 mg by mouth 2 (two) times daily.  . clonazePAM (KLONOPIN) 0.5 MG tablet Take 0.5 mg by mouth 2 (two) times daily as needed for anxiety.  . cyclobenzaprine (FLEXERIL) 10 MG tablet Take 1 tablet (10 mg total) by mouth 3 (three) times daily as needed for Muscle spasms.  Marland Kitchen dicyclomine (BENTYL) 10 MG capsule Take 1 capsule (10 mg total) by mouth 4 (four) times daily  as needed for spasms.  Marland Kitchen EPINEPHrine 0.3 mg/0.3 mL IJ SOAJ injection Inject 0.3 mg into the muscle as needed for anaphylaxis.  Marland Kitchen escitalopram (LEXAPRO) 20 MG tablet Take 20 mg by mouth daily.  . furosemide (LASIX) 20 MG tablet Take 20 mg by mouth daily.  Marland Kitchen lamoTRIgine (LAMICTAL) 100 MG tablet Take 1.5 tablets in the morning, take 2 tablets in the evening.  Marland Kitchen LYRICA 50 MG capsule Take 1 capsule by mouth 2 (two) times daily.   . nitroGLYCERIN (NITROSTAT) 0.4 MG SL tablet Place 0.4 mg under the tongue every 5 (five) minutes as needed for chest pain.  Marland Kitchen omeprazole (PRILOSEC) 40 MG capsule Take 40 mg by mouth daily.  . ondansetron (ZOFRAN) 4 MG tablet Take 1 tablet  (4 mg total) by mouth every 8 (eight) hours as needed for nausea or vomiting.  . predniSONE (DELTASONE) 5 MG tablet Take 5 mg by mouth as needed (swelling).  Arvid Right 150 MG/ML prefilled syringe Inject 150 mg into the skin every 30 (thirty) days. 2 injections  . zolpidem (AMBIEN) 10 MG tablet Take 10 mg by mouth at bedtime as needed. for sleep     Allergies:   Bee venom, Ibuprofen, Acetaminophen, Aminolevulinate derivatives, Aspirin-acetaminophen-caffeine, Dapsone, Dimercaprol, Methylcellulose, Nitrofuran derivatives, Phenazopyridine, Primaquine, Toluidine blue, Celebrex [celecoxib], Latex, Oxycodone, and Phenergan [promethazine hcl]   Social History   Socioeconomic History  . Marital status: Single    Spouse name: Not on file  . Number of children: 3  . Years of education: 49  . Highest education level: Associate degree: occupational, Hotel manager, or vocational program  Occupational History  . Occupation: Disabled  Tobacco Use  . Smoking status: Never Smoker  . Smokeless tobacco: Never Used  Vaping Use  . Vaping Use: Never used  Substance and Sexual Activity  . Alcohol use: No  . Drug use: No  . Sexual activity: Not on file  Other Topics Concern  . Not on file  Social History Narrative   Lives at home with daughter, son and grandson.   Right-handed.   No caffeine use.   Social Determinants of Health   Financial Resource Strain: Not on file  Food Insecurity: Not on file  Transportation Needs: Not on file  Physical Activity: Not on file  Stress: Not on file  Social Connections: Not on file     Family History: The patient's family history includes Heart attack in her father; Hypertension in her mother; Kidney cancer in her mother; Lung cancer in her maternal grandfather; Pancreatic cancer in her maternal grandfather; Prostate cancer in her maternal grandfather; Seizures in her father; Stroke in her maternal grandmother; Thyroid cancer in her maternal aunt and sister.  ROS:    Please see the history of present illness.    All other systems reviewed and are negative.  EKGs/Labs/Other Studies Reviewed:    The following studies were reviewed today: EKG reveals sinus rhythm and nonspecific ST-T changes.   Recent Labs: 12/07/2020: ALT 19; BUN 11; Creatinine, Ser 1.02; Hemoglobin 13.9; Platelets 363; Potassium 4.2; Sodium 143 04/10/2021: TSH 0.06  Recent Lipid Panel No results found for: CHOL, TRIG, HDL, CHOLHDL, VLDL, LDLCALC, LDLDIRECT  Physical Exam:    VS:  BP 130/84   Pulse 88   Ht 5\' 5"  (1.651 m)   Wt 229 lb 1.3 oz (103.9 kg)   SpO2 97%   BMI 38.12 kg/m     Wt Readings from Last 3 Encounters:  04/19/21 229 lb 1.3 oz (103.9 kg)  04/10/21 228 lb 3.2 oz (103.5 kg)  12/07/20 225 lb (102.1 kg)     GEN: Patient is in no acute distress HEENT: Normal NECK: No JVD; No carotid bruits LYMPHATICS: No lymphadenopathy CARDIAC: S1 S2 regular, 2/6 systolic murmur at the apex. RESPIRATORY:  Clear to auscultation without rales, wheezing or rhonchi  ABDOMEN: Soft, non-tender, non-distended MUSCULOSKELETAL:  No edema; No deformity  SKIN: Warm and dry NEUROLOGIC:  Alert and oriented x 3 PSYCHIATRIC:  Normal affect    Signed, Jenean Lindau, MD  04/19/2021 4:44 PM    [facility]

## 2021-04-20 DIAGNOSIS — E785 Hyperlipidemia, unspecified: Secondary | ICD-10-CM | POA: Diagnosis not present

## 2021-04-20 DIAGNOSIS — Z1331 Encounter for screening for depression: Secondary | ICD-10-CM | POA: Diagnosis not present

## 2021-04-20 DIAGNOSIS — Z9181 History of falling: Secondary | ICD-10-CM | POA: Diagnosis not present

## 2021-04-20 DIAGNOSIS — Z0000 Encounter for general adult medical examination without abnormal findings: Secondary | ICD-10-CM | POA: Diagnosis not present

## 2021-04-25 DIAGNOSIS — L501 Idiopathic urticaria: Secondary | ICD-10-CM | POA: Diagnosis not present

## 2021-05-07 DIAGNOSIS — E042 Nontoxic multinodular goiter: Secondary | ICD-10-CM | POA: Diagnosis not present

## 2021-05-07 DIAGNOSIS — R55 Syncope and collapse: Secondary | ICD-10-CM | POA: Diagnosis not present

## 2021-05-07 DIAGNOSIS — K219 Gastro-esophageal reflux disease without esophagitis: Secondary | ICD-10-CM | POA: Diagnosis not present

## 2021-05-07 DIAGNOSIS — R110 Nausea: Secondary | ICD-10-CM | POA: Diagnosis not present

## 2021-05-07 DIAGNOSIS — R519 Headache, unspecified: Secondary | ICD-10-CM | POA: Diagnosis not present

## 2021-05-07 DIAGNOSIS — E039 Hypothyroidism, unspecified: Secondary | ICD-10-CM | POA: Diagnosis not present

## 2021-05-07 DIAGNOSIS — G5620 Lesion of ulnar nerve, unspecified upper limb: Secondary | ICD-10-CM | POA: Diagnosis not present

## 2021-05-07 DIAGNOSIS — T7840XA Allergy, unspecified, initial encounter: Secondary | ICD-10-CM | POA: Diagnosis not present

## 2021-05-07 DIAGNOSIS — M159 Polyosteoarthritis, unspecified: Secondary | ICD-10-CM | POA: Diagnosis not present

## 2021-05-07 DIAGNOSIS — G4762 Sleep related leg cramps: Secondary | ICD-10-CM | POA: Diagnosis not present

## 2021-05-07 DIAGNOSIS — M797 Fibromyalgia: Secondary | ICD-10-CM | POA: Diagnosis not present

## 2021-05-07 DIAGNOSIS — G4700 Insomnia, unspecified: Secondary | ICD-10-CM | POA: Diagnosis not present

## 2021-05-08 ENCOUNTER — Ambulatory Visit: Payer: Medicare Other | Admitting: Endocrinology

## 2021-05-16 ENCOUNTER — Other Ambulatory Visit: Payer: Self-pay

## 2021-05-16 ENCOUNTER — Telehealth: Payer: Self-pay

## 2021-05-16 ENCOUNTER — Ambulatory Visit (HOSPITAL_BASED_OUTPATIENT_CLINIC_OR_DEPARTMENT_OTHER)
Admission: RE | Admit: 2021-05-16 | Discharge: 2021-05-16 | Disposition: A | Discharge status: Home / Self Care | Payer: Medicare Other | Source: Ambulatory Visit | Attending: Cardiology | Admitting: Cardiology

## 2021-05-16 DIAGNOSIS — R011 Cardiac murmur, unspecified: Secondary | ICD-10-CM | POA: Insufficient documentation

## 2021-05-16 LAB — ECHOCARDIOGRAM COMPLETE
AR max vel: 2.98 cm2
AV Area VTI: 2.88 cm2
AV Area mean vel: 2.7 cm2
AV Mean grad: 3 mmHg
AV Peak grad: 4.8 mmHg
Ao pk vel: 1.1 m/s
Area-P 1/2: 4.36 cm2
Calc EF: 55.4 %
S' Lateral: 3.64 cm
Single Plane A2C EF: 54.8 %
Single Plane A4C EF: 54.3 %

## 2021-05-16 NOTE — Telephone Encounter (Signed)
-----   Message from Jenean Lindau, MD sent at 05/16/2021 11:59 AM EDT ----- The results of the study is unremarkable. Please inform patient. I will discuss in detail at next appointment. Cc  primary care/referring physician Jenean Lindau, MD 05/16/2021 11:59 AM

## 2021-05-16 NOTE — Telephone Encounter (Signed)
Results reviewed with pt as per Dr. Revankar's note.  Pt verbalized understanding and had no additional questions. Routed to PCP.  

## 2021-06-01 DIAGNOSIS — F418 Other specified anxiety disorders: Secondary | ICD-10-CM | POA: Insufficient documentation

## 2021-06-01 DIAGNOSIS — M797 Fibromyalgia: Secondary | ICD-10-CM | POA: Insufficient documentation

## 2021-06-01 DIAGNOSIS — E079 Disorder of thyroid, unspecified: Secondary | ICD-10-CM | POA: Insufficient documentation

## 2021-06-01 DIAGNOSIS — G43909 Migraine, unspecified, not intractable, without status migrainosus: Secondary | ICD-10-CM | POA: Insufficient documentation

## 2021-06-01 DIAGNOSIS — L501 Idiopathic urticaria: Secondary | ICD-10-CM | POA: Diagnosis not present

## 2021-06-01 DIAGNOSIS — N200 Calculus of kidney: Secondary | ICD-10-CM | POA: Insufficient documentation

## 2021-06-04 ENCOUNTER — Encounter: Payer: Self-pay | Admitting: *Deleted

## 2021-06-05 ENCOUNTER — Ambulatory Visit (INDEPENDENT_AMBULATORY_CARE_PROVIDER_SITE_OTHER): Payer: Medicare Other | Admitting: Neurology

## 2021-06-05 ENCOUNTER — Other Ambulatory Visit: Payer: Self-pay

## 2021-06-05 ENCOUNTER — Encounter: Payer: Self-pay | Admitting: Neurology

## 2021-06-05 VITALS — BP 116/75 | HR 84 | Ht 65.0 in | Wt 230.0 lb

## 2021-06-05 DIAGNOSIS — G40909 Epilepsy, unspecified, not intractable, without status epilepticus: Secondary | ICD-10-CM | POA: Diagnosis not present

## 2021-06-05 DIAGNOSIS — Z5181 Encounter for therapeutic drug level monitoring: Secondary | ICD-10-CM

## 2021-06-05 DIAGNOSIS — G43019 Migraine without aura, intractable, without status migrainosus: Secondary | ICD-10-CM | POA: Diagnosis not present

## 2021-06-05 HISTORY — DX: Migraine without aura, intractable, without status migrainosus: G43.019

## 2021-06-05 MED ORDER — RIZATRIPTAN BENZOATE 10 MG PO TABS: 10.0000 mg | ORAL_TABLET | Freq: Three times a day (TID) | ORAL | 3 refills | Status: DC | PRN

## 2021-06-05 MED ORDER — LAMOTRIGINE 100 MG PO TABS: 200.0000 mg | ORAL_TABLET | Freq: Two times a day (BID) | ORAL | 6 refills | Status: DC

## 2021-06-05 MED ORDER — TOPIRAMATE 25 MG PO TABS: ORAL_TABLET | ORAL | 3 refills | Status: DC

## 2021-06-05 NOTE — Patient Instructions (Signed)
We will start Topamax for the headache.   Topamax (topiramate) is a seizure medication that has an FDA approval for seizures and for migraine headache. Potential side effects of this medication include weight loss, cognitive slowing, tingling in the fingers and toes, and carbonated drinks will taste bad. If any significant side effects are noted on this drug, please contact our office.  
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
## 2021-06-06 LAB — LAMOTRIGINE LEVEL: Lamotrigine Lvl: 6.9 ug/mL (ref 2.0–20.0)

## 2021-06-07 ENCOUNTER — Ambulatory Visit: Payer: Medicare Other | Admitting: Neurology

## 2021-06-08 ENCOUNTER — Inpatient Hospital Stay: Admission: RE | Admit: 2021-06-08 | Payer: Self-pay | Source: Ambulatory Visit

## 2021-06-14 ENCOUNTER — Ambulatory Visit: Payer: Medicare Other | Admitting: Cardiology

## 2021-06-15 ENCOUNTER — Ambulatory Visit: Payer: Medicare Other | Admitting: Cardiology

## 2021-06-18 DIAGNOSIS — R55 Syncope and collapse: Secondary | ICD-10-CM | POA: Diagnosis not present

## 2021-06-18 DIAGNOSIS — H6691 Otitis media, unspecified, right ear: Secondary | ICD-10-CM | POA: Diagnosis not present

## 2021-06-18 DIAGNOSIS — R110 Nausea: Secondary | ICD-10-CM | POA: Diagnosis not present

## 2021-06-18 DIAGNOSIS — R5382 Chronic fatigue, unspecified: Secondary | ICD-10-CM | POA: Diagnosis not present

## 2021-06-18 DIAGNOSIS — G5620 Lesion of ulnar nerve, unspecified upper limb: Secondary | ICD-10-CM | POA: Diagnosis not present

## 2021-06-18 DIAGNOSIS — E039 Hypothyroidism, unspecified: Secondary | ICD-10-CM | POA: Diagnosis not present

## 2021-06-18 DIAGNOSIS — M25511 Pain in right shoulder: Secondary | ICD-10-CM | POA: Diagnosis not present

## 2021-06-18 DIAGNOSIS — M159 Polyosteoarthritis, unspecified: Secondary | ICD-10-CM | POA: Diagnosis not present

## 2021-06-18 DIAGNOSIS — R519 Headache, unspecified: Secondary | ICD-10-CM | POA: Diagnosis not present

## 2021-06-18 DIAGNOSIS — E042 Nontoxic multinodular goiter: Secondary | ICD-10-CM | POA: Diagnosis not present

## 2021-06-18 DIAGNOSIS — G4762 Sleep related leg cramps: Secondary | ICD-10-CM | POA: Diagnosis not present

## 2021-06-18 DIAGNOSIS — K219 Gastro-esophageal reflux disease without esophagitis: Secondary | ICD-10-CM | POA: Diagnosis not present

## 2021-06-18 DIAGNOSIS — M797 Fibromyalgia: Secondary | ICD-10-CM | POA: Diagnosis not present

## 2021-06-25 ENCOUNTER — Ambulatory Visit: Payer: Medicare Other | Admitting: Endocrinology

## 2021-06-26 ENCOUNTER — Other Ambulatory Visit: Payer: Self-pay

## 2021-06-26 DIAGNOSIS — E785 Hyperlipidemia, unspecified: Secondary | ICD-10-CM | POA: Insufficient documentation

## 2021-06-26 DIAGNOSIS — F419 Anxiety disorder, unspecified: Secondary | ICD-10-CM | POA: Insufficient documentation

## 2021-06-26 DIAGNOSIS — K219 Gastro-esophageal reflux disease without esophagitis: Secondary | ICD-10-CM | POA: Insufficient documentation

## 2021-06-26 DIAGNOSIS — I10 Essential (primary) hypertension: Secondary | ICD-10-CM | POA: Insufficient documentation

## 2021-06-27 ENCOUNTER — Ambulatory Visit: Payer: Medicare Other | Admitting: Cardiology

## 2021-06-28 DIAGNOSIS — M797 Fibromyalgia: Secondary | ICD-10-CM | POA: Diagnosis not present

## 2021-06-28 DIAGNOSIS — K219 Gastro-esophageal reflux disease without esophagitis: Secondary | ICD-10-CM | POA: Diagnosis not present

## 2021-06-28 DIAGNOSIS — M159 Polyosteoarthritis, unspecified: Secondary | ICD-10-CM | POA: Diagnosis not present

## 2021-06-28 DIAGNOSIS — E042 Nontoxic multinodular goiter: Secondary | ICD-10-CM | POA: Diagnosis not present

## 2021-06-28 DIAGNOSIS — R519 Headache, unspecified: Secondary | ICD-10-CM | POA: Diagnosis not present

## 2021-06-28 DIAGNOSIS — E039 Hypothyroidism, unspecified: Secondary | ICD-10-CM | POA: Diagnosis not present

## 2021-06-28 DIAGNOSIS — R079 Chest pain, unspecified: Secondary | ICD-10-CM | POA: Diagnosis not present

## 2021-06-28 DIAGNOSIS — R55 Syncope and collapse: Secondary | ICD-10-CM | POA: Diagnosis not present

## 2021-06-28 DIAGNOSIS — G4762 Sleep related leg cramps: Secondary | ICD-10-CM | POA: Diagnosis not present

## 2021-06-28 DIAGNOSIS — G4700 Insomnia, unspecified: Secondary | ICD-10-CM | POA: Diagnosis not present

## 2021-06-28 DIAGNOSIS — R110 Nausea: Secondary | ICD-10-CM | POA: Diagnosis not present

## 2021-06-28 DIAGNOSIS — G5620 Lesion of ulnar nerve, unspecified upper limb: Secondary | ICD-10-CM | POA: Diagnosis not present

## 2021-07-02 ENCOUNTER — Ambulatory Visit: Payer: Medicare Other | Admitting: Neurology

## 2021-07-03 ENCOUNTER — Encounter: Payer: Self-pay | Admitting: Endocrinology

## 2021-07-03 ENCOUNTER — Other Ambulatory Visit: Payer: Self-pay

## 2021-07-03 ENCOUNTER — Ambulatory Visit (INDEPENDENT_AMBULATORY_CARE_PROVIDER_SITE_OTHER): Payer: Medicare Other | Admitting: Endocrinology

## 2021-07-03 VITALS — BP 138/86 | HR 88 | Ht 65.0 in | Wt 229.6 lb

## 2021-07-03 DIAGNOSIS — R55 Syncope and collapse: Secondary | ICD-10-CM | POA: Diagnosis not present

## 2021-07-03 DIAGNOSIS — M542 Cervicalgia: Secondary | ICD-10-CM

## 2021-07-03 DIAGNOSIS — E039 Hypothyroidism, unspecified: Secondary | ICD-10-CM | POA: Diagnosis not present

## 2021-07-03 DIAGNOSIS — M159 Polyosteoarthritis, unspecified: Secondary | ICD-10-CM | POA: Diagnosis not present

## 2021-07-03 DIAGNOSIS — M797 Fibromyalgia: Secondary | ICD-10-CM | POA: Diagnosis not present

## 2021-07-03 DIAGNOSIS — G5620 Lesion of ulnar nerve, unspecified upper limb: Secondary | ICD-10-CM | POA: Diagnosis not present

## 2021-07-03 DIAGNOSIS — K219 Gastro-esophageal reflux disease without esophagitis: Secondary | ICD-10-CM | POA: Diagnosis not present

## 2021-07-03 DIAGNOSIS — R110 Nausea: Secondary | ICD-10-CM | POA: Diagnosis not present

## 2021-07-03 DIAGNOSIS — E042 Nontoxic multinodular goiter: Secondary | ICD-10-CM | POA: Diagnosis not present

## 2021-07-03 DIAGNOSIS — R768 Other specified abnormal immunological findings in serum: Secondary | ICD-10-CM | POA: Diagnosis not present

## 2021-07-03 DIAGNOSIS — R079 Chest pain, unspecified: Secondary | ICD-10-CM | POA: Diagnosis not present

## 2021-07-03 DIAGNOSIS — R519 Headache, unspecified: Secondary | ICD-10-CM | POA: Diagnosis not present

## 2021-07-03 DIAGNOSIS — G4762 Sleep related leg cramps: Secondary | ICD-10-CM | POA: Diagnosis not present
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
## 2021-07-04 DIAGNOSIS — L501 Idiopathic urticaria: Secondary | ICD-10-CM | POA: Diagnosis not present

## 2021-07-05 ENCOUNTER — Ambulatory Visit
Admission: RE | Admit: 2021-07-05 | Discharge: 2021-07-05 | Disposition: A | Discharge status: Home / Self Care | Payer: Medicare Other | Source: Ambulatory Visit | Attending: Endocrinology | Admitting: Endocrinology

## 2021-07-05 ENCOUNTER — Other Ambulatory Visit: Payer: Self-pay

## 2021-07-05 DIAGNOSIS — E041 Nontoxic single thyroid nodule: Secondary | ICD-10-CM | POA: Diagnosis not present

## 2021-07-05 DIAGNOSIS — E042 Nontoxic multinodular goiter: Secondary | ICD-10-CM

## 2021-07-05 DIAGNOSIS — M542 Cervicalgia: Secondary | ICD-10-CM

## 2021-07-05 IMAGING — US US THYROID
1 series · 12 of 25 positions shown · non-contrast
Comparison: [DATE]

CLINICAL DATA: Prior ultrasound follow-up. Follow-up multinodular
goiter.

EXAM:
THYROID ULTRASOUND
TECHNIQUE: Ultrasound examination of the thyroid gland and adjacent soft
tissues was performed.

[Series 1: us thyroid · 0.06mm/px · 12 of 62 slices shown]
[im 3/62]
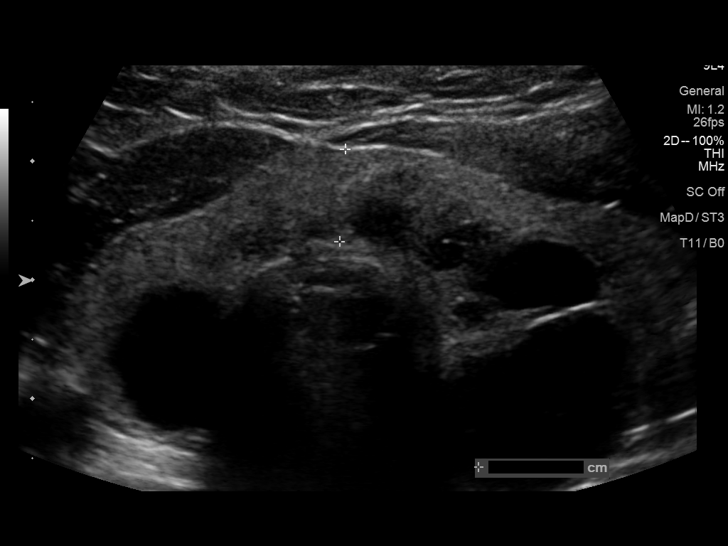
[im 8/62]
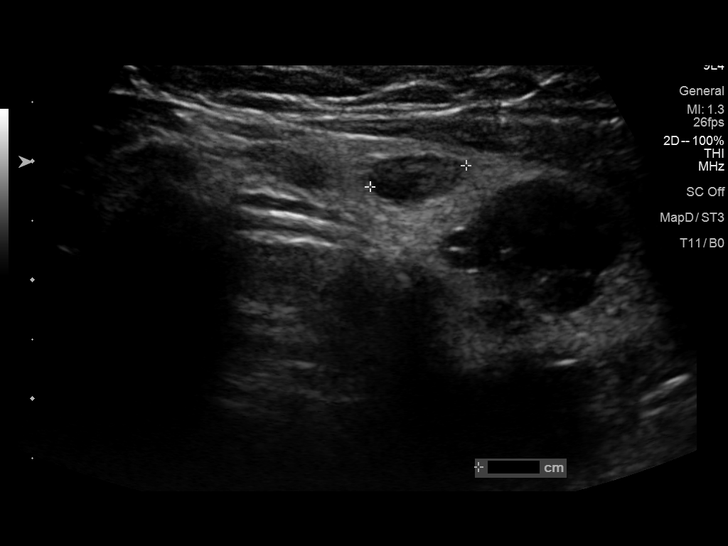
[im 13/62]
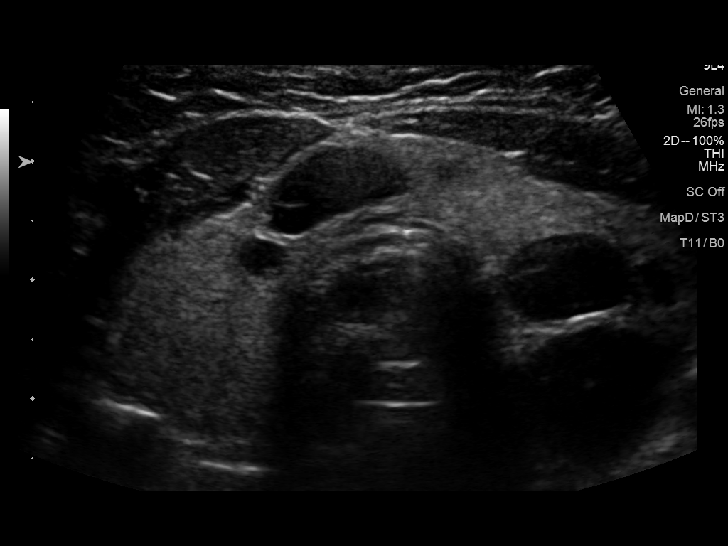
[im 18/62]
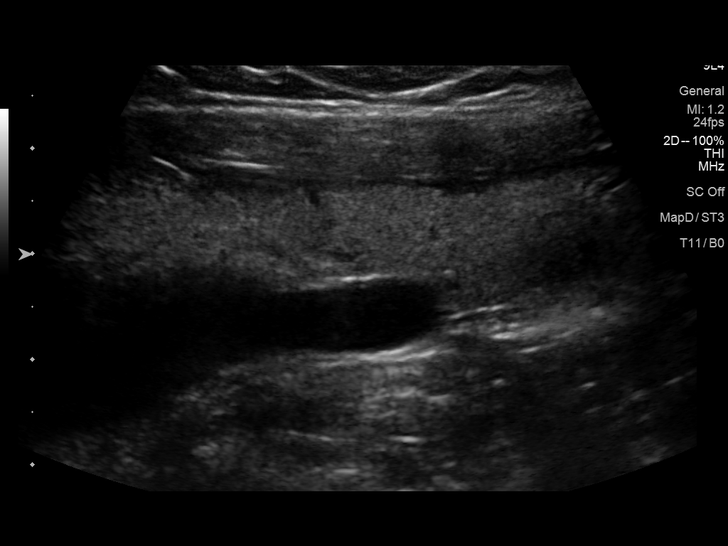
[im 23/62]
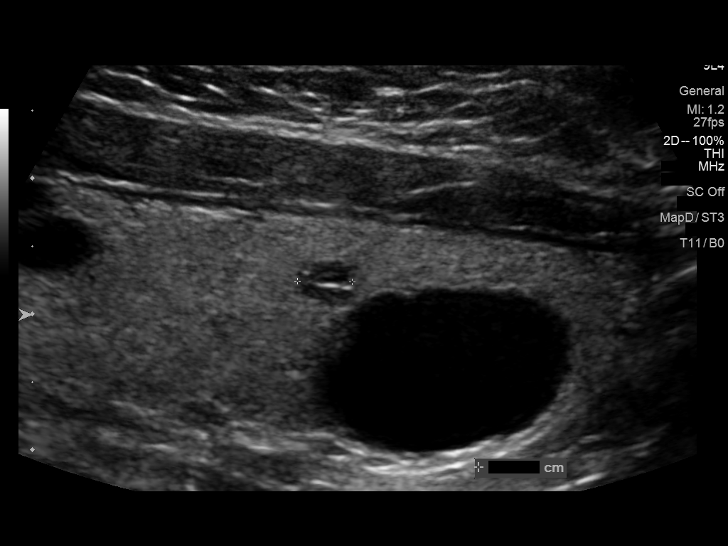
[im 28/62]
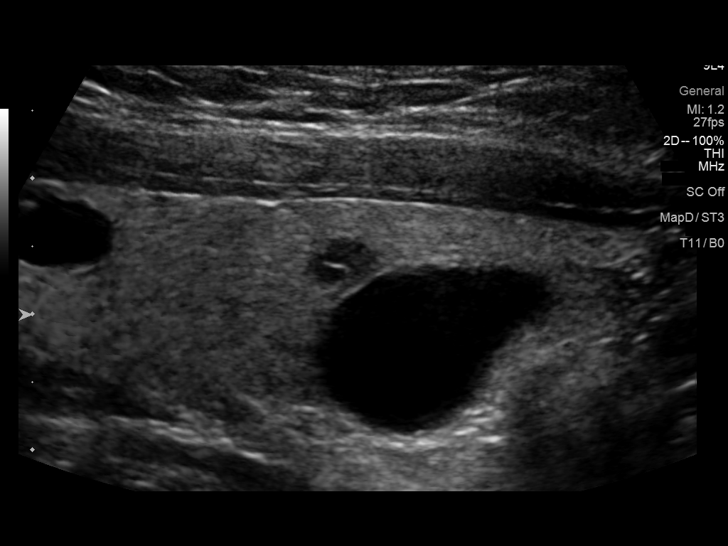
[im 34/62]
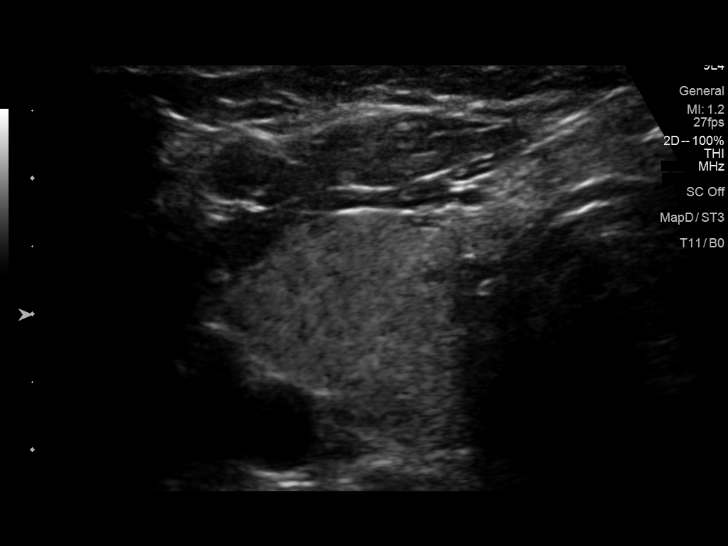
[im 39/62]
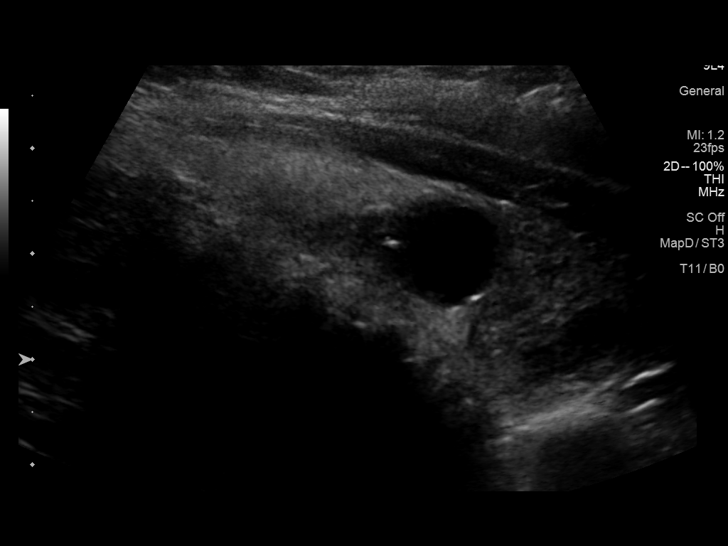
[im 44/62]
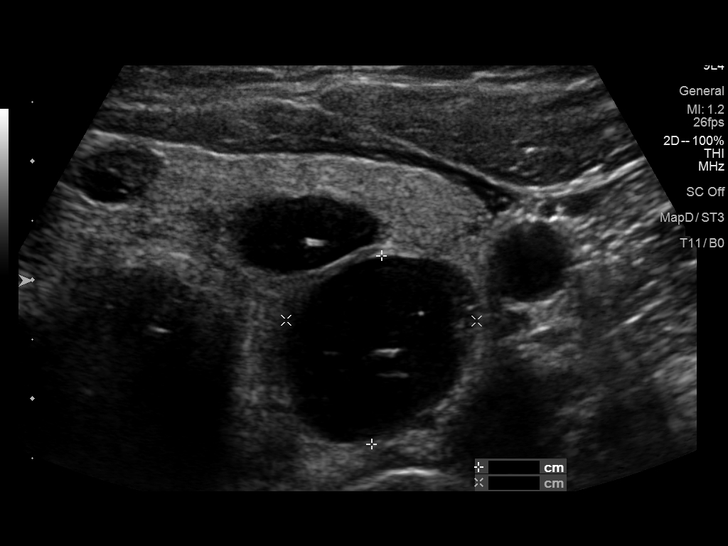
[im 49/62]
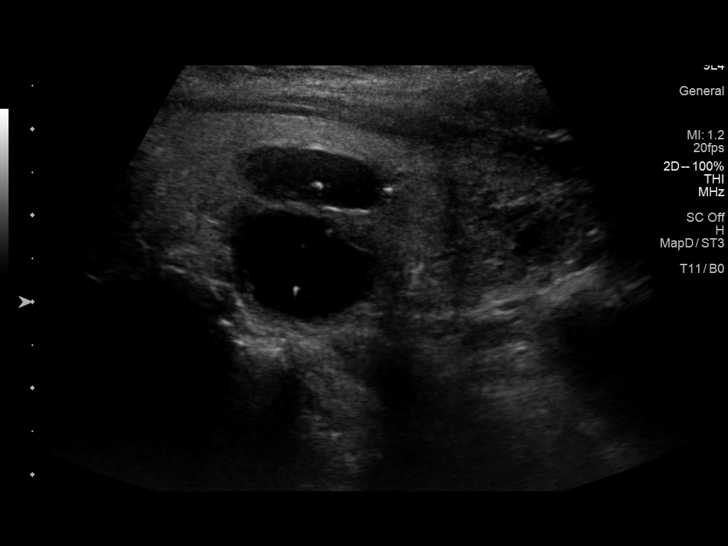
[im 54/62]
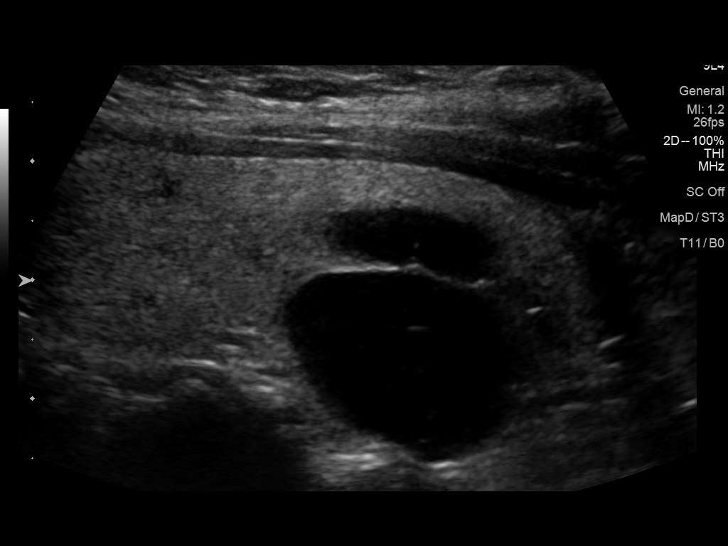
[im 59/62]
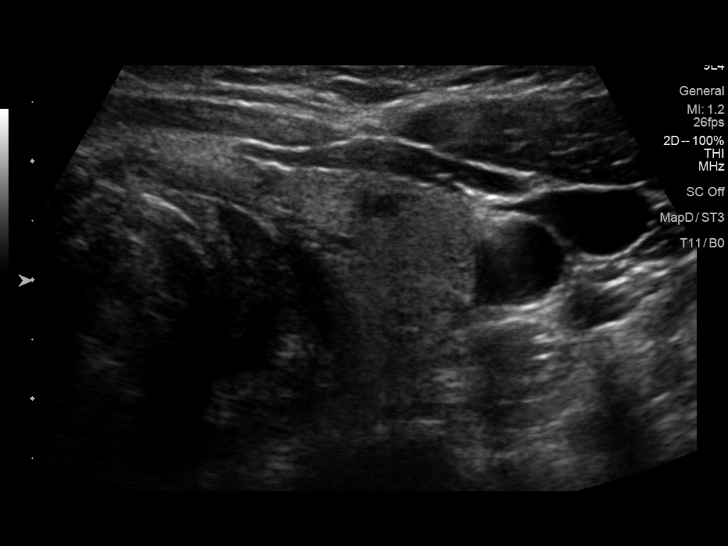

[12 of 25 positions shown; findings below may reference images not displayed]

FINDINGS: Parenchymal Echotexture: Mildly heterogenous

Isthmus: Borderline enlarged measures 0.8 cm in diameter, unchanged

Right lobe: Borderline enlarged measuring 6.0 x 1.9 x 1.7 cm,
previously, 6.5 x 1.7 x 2.4 cm

Left lobe: Borderline enlarged measuring 5.4 x 2.8 x 2.5 cm,
previously, 6.6 x 2.4 x 2.5 cm

_________________________________________________________

Estimated total number of nodules >/= 1 cm: 6-10

Number of spongiform nodules >/=  2 cm not described below (TR1): 0

Number of mixed cystic and solid nodules >/= 1.5 cm not described
below (TR2): 0

_________________________________________________________

There is an approximately 1.2 cm anechoic cyst in the right-sided
the thyroid isthmus (labeled 1), which is unchanged compared to the
[DATE] examination again does not meet criteria to recommend
percutaneous sampling or continued dedicated follow-up.

There is an approximately 0.8 cm hypoechoic nodule in the left-sided
thyroid isthmus (labeled 2), which is unchanged compared to the
[DATE] examination again does not meet criteria to recommend
percutaneous sampling or continued dedicated follow-up.

_________________________________________________________

There is an approximately 0.7 cm anechoic cyst within the superior
pole the right lobe of the thyroid (labeled 3), not definitively
seen on the [DATE] 20 examination though does not meet criteria to
recommend percutaneous sampling or continued dedicated follow-up.

There is approximately 1.0 cm anechoic cyst within the mid aspect
the right lobe of the thyroid (labeled 4), which is unchanged
compared to the [DATE] examination and again does not meet criteria
to recommend percutaneous sampling or continued dedicated follow-up.

There is an approximately 1.9 x 1.5 x 1.2 cm anechoic cyst within
the inferior pole of the right lobe of the thyroid (labeled 5) which
has minimally increased in size compared to the [DATE] examination,
previously, 1.6 x 1.2 x 0.9 cm however size differences attributable
to continued cystic degeneration, a typically benign finding and
again this nodule does not meet criteria to recommend percutaneous
sampling or continued dedicated follow-up.

_________________________________________________________

There is an approximately 1.7 x 1.3 x 0.7 cm anechoic cyst within
the superior pole the left lobe of the thyroid (labeled 6) which has
minimally increased in size compared to the [DATE] examination,
previously, 1.3 cm in maximal diameter however size differences
attributable to cystic degeneration diverticula benign finding.
Additionally, this nodule is again noted to contain internal
echogenic foci ring artifact compatible with benign colloid. This
benign colloid containing cyst does not meet criteria to recommend
percutaneous sampling or continued dedicated follow-up.

There is an approximately 2.1 x 1.6 x 1.6 cm caliber containing cyst
within mid aspect the left lobe of the thyroid (labeled 7) which is
grossly unchanged compared to the [DATE] examination again does not
meet criteria to recommend percutaneous sampling or continued
dedicated follow-up.

There is an approximately 1.7 x 1.5 x 1.1 cm partially solid though
predominantly cystic nodule within the inferior pole the left lobe
of the thyroid (labeled 8), which is unchanged to decreased in size
compared to the [DATE] examination, previously, 1.9 x 1.9 x 1.2 cm,
and again does not meet criteria to recommend percutaneous sampling
or continued dedicated follow-up

The approximately 2.2 x 2.1 x 1.7 cm spongiform/benign-appearing
nodule within the inferior pole the left lobe of the thyroid
(labeled 9) is grossly unchanged compared to the [DATE]
examination, previously, 1.9 x 1.6 x 1.3 cm with size differences
attributable to interval partial cystic degeneration, a typically
benign finding.
IMPRESSION: 1. Similar findings of borderline thyromegaly and multinodular
goiter. No new or enlarging thyroid nodules.
2. None of the discretely measured thyroid nodules/cysts meet
imaging criteria to recommend percutaneous sampling or continued
dedicated follow-up.

The above is in keeping with the ACR TI-RADS recommendations - [HOSPITAL] [RT];[DATE].

## 2021-07-06 NOTE — Progress Notes (Signed)
Please call to let patient know that the thyroid ultrasound shows stable goiter and no new findings; no further action needed.  She will follow-up with her PCP regarding neck pain

## 2021-07-09 ENCOUNTER — Ambulatory Visit: Payer: Medicare Other | Admitting: Neurology

## 2021-07-10 ENCOUNTER — Telehealth: Payer: Self-pay

## 2021-07-10 NOTE — Telephone Encounter (Addendum)
Called and left a detailed message for pt with results.  ----- Message from Elayne Snare, MD sent at 07/06/2021  3:05 PM EDT ----- Please call to let patient know that the thyroid ultrasound shows stable goiter and no new findings; no further action needed.  She will follow-up with her PCP regarding neck pain

## 2021-07-24 DIAGNOSIS — R519 Headache, unspecified: Secondary | ICD-10-CM | POA: Diagnosis not present

## 2021-07-24 DIAGNOSIS — F419 Anxiety disorder, unspecified: Secondary | ICD-10-CM | POA: Diagnosis not present

## 2021-07-24 DIAGNOSIS — M159 Polyosteoarthritis, unspecified: Secondary | ICD-10-CM | POA: Diagnosis not present

## 2021-07-24 DIAGNOSIS — K219 Gastro-esophageal reflux disease without esophagitis: Secondary | ICD-10-CM | POA: Diagnosis not present

## 2021-07-24 DIAGNOSIS — E042 Nontoxic multinodular goiter: Secondary | ICD-10-CM | POA: Diagnosis not present

## 2021-07-24 DIAGNOSIS — G4700 Insomnia, unspecified: Secondary | ICD-10-CM | POA: Diagnosis not present

## 2021-07-24 DIAGNOSIS — R55 Syncope and collapse: Secondary | ICD-10-CM | POA: Diagnosis not present

## 2021-07-24 DIAGNOSIS — R110 Nausea: Secondary | ICD-10-CM | POA: Diagnosis not present

## 2021-07-24 DIAGNOSIS — E039 Hypothyroidism, unspecified: Secondary | ICD-10-CM | POA: Diagnosis not present

## 2021-07-24 DIAGNOSIS — M797 Fibromyalgia: Secondary | ICD-10-CM | POA: Diagnosis not present

## 2021-07-24 DIAGNOSIS — G5620 Lesion of ulnar nerve, unspecified upper limb: Secondary | ICD-10-CM | POA: Diagnosis not present

## 2021-07-24 DIAGNOSIS — G4762 Sleep related leg cramps: Secondary | ICD-10-CM | POA: Diagnosis not present

## 2021-08-04 DIAGNOSIS — R112 Nausea with vomiting, unspecified: Secondary | ICD-10-CM | POA: Diagnosis not present

## 2021-08-04 DIAGNOSIS — Z20822 Contact with and (suspected) exposure to covid-19: Secondary | ICD-10-CM | POA: Diagnosis not present

## 2021-08-07 DIAGNOSIS — G4762 Sleep related leg cramps: Secondary | ICD-10-CM | POA: Diagnosis not present

## 2021-08-07 DIAGNOSIS — G5620 Lesion of ulnar nerve, unspecified upper limb: Secondary | ICD-10-CM | POA: Diagnosis not present

## 2021-08-07 DIAGNOSIS — L501 Idiopathic urticaria: Secondary | ICD-10-CM | POA: Diagnosis not present

## 2021-08-07 DIAGNOSIS — R768 Other specified abnormal immunological findings in serum: Secondary | ICD-10-CM | POA: Diagnosis not present

## 2021-08-07 DIAGNOSIS — E039 Hypothyroidism, unspecified: Secondary | ICD-10-CM | POA: Diagnosis not present

## 2021-08-07 DIAGNOSIS — R079 Chest pain, unspecified: Secondary | ICD-10-CM | POA: Diagnosis not present

## 2021-08-07 DIAGNOSIS — R110 Nausea: Secondary | ICD-10-CM | POA: Diagnosis not present

## 2021-08-07 DIAGNOSIS — E042 Nontoxic multinodular goiter: Secondary | ICD-10-CM | POA: Diagnosis not present

## 2021-08-07 DIAGNOSIS — R55 Syncope and collapse: Secondary | ICD-10-CM | POA: Diagnosis not present

## 2021-08-07 DIAGNOSIS — K219 Gastro-esophageal reflux disease without esophagitis: Secondary | ICD-10-CM | POA: Diagnosis not present

## 2021-08-07 DIAGNOSIS — R519 Headache, unspecified: Secondary | ICD-10-CM | POA: Diagnosis not present

## 2021-08-07 DIAGNOSIS — M159 Polyosteoarthritis, unspecified: Secondary | ICD-10-CM | POA: Diagnosis not present

## 2021-08-07 DIAGNOSIS — M797 Fibromyalgia: Secondary | ICD-10-CM | POA: Diagnosis not present

## 2021-08-20 DIAGNOSIS — M797 Fibromyalgia: Secondary | ICD-10-CM | POA: Diagnosis not present

## 2021-08-20 DIAGNOSIS — E039 Hypothyroidism, unspecified: Secondary | ICD-10-CM | POA: Diagnosis not present

## 2021-08-20 DIAGNOSIS — G4700 Insomnia, unspecified: Secondary | ICD-10-CM | POA: Diagnosis not present

## 2021-08-20 DIAGNOSIS — F419 Anxiety disorder, unspecified: Secondary | ICD-10-CM | POA: Diagnosis not present

## 2021-08-20 DIAGNOSIS — G5620 Lesion of ulnar nerve, unspecified upper limb: Secondary | ICD-10-CM | POA: Diagnosis not present

## 2021-08-20 DIAGNOSIS — M159 Polyosteoarthritis, unspecified: Secondary | ICD-10-CM | POA: Diagnosis not present

## 2021-08-20 DIAGNOSIS — R55 Syncope and collapse: Secondary | ICD-10-CM | POA: Diagnosis not present

## 2021-08-20 DIAGNOSIS — R519 Headache, unspecified: Secondary | ICD-10-CM | POA: Diagnosis not present

## 2021-08-20 DIAGNOSIS — G4762 Sleep related leg cramps: Secondary | ICD-10-CM | POA: Diagnosis not present

## 2021-08-20 DIAGNOSIS — R110 Nausea: Secondary | ICD-10-CM | POA: Diagnosis not present

## 2021-08-20 DIAGNOSIS — K219 Gastro-esophageal reflux disease without esophagitis: Secondary | ICD-10-CM | POA: Diagnosis not present

## 2021-08-20 DIAGNOSIS — E042 Nontoxic multinodular goiter: Secondary | ICD-10-CM | POA: Diagnosis not present

## 2021-09-03 DIAGNOSIS — L501 Idiopathic urticaria: Secondary | ICD-10-CM | POA: Diagnosis not present

## 2021-09-18 NOTE — Telephone Encounter (Signed)
Pt states that she has had nausea and vomiting off and on for several months. States that it happens when she smells different things. Requesting office visit. Pt scheduled for 10/18/2021 @ 2:10  with [name]  Pt made aware Pt verbalized understanding with all questions answered.

## 2021-09-21 ENCOUNTER — Other Ambulatory Visit: Payer: Self-pay | Admitting: Gastroenterology

## 2021-09-26 DIAGNOSIS — R1013 Epigastric pain: Secondary | ICD-10-CM | POA: Diagnosis not present

## 2021-09-26 DIAGNOSIS — R079 Chest pain, unspecified: Secondary | ICD-10-CM | POA: Diagnosis not present

## 2021-09-26 DIAGNOSIS — I517 Cardiomegaly: Secondary | ICD-10-CM | POA: Diagnosis not present

## 2021-09-26 DIAGNOSIS — R112 Nausea with vomiting, unspecified: Secondary | ICD-10-CM | POA: Diagnosis not present

## 2021-09-27 DIAGNOSIS — R079 Chest pain, unspecified: Secondary | ICD-10-CM | POA: Diagnosis not present

## 2021-10-01 DIAGNOSIS — L501 Idiopathic urticaria: Secondary | ICD-10-CM | POA: Diagnosis not present

## 2021-10-03 DIAGNOSIS — H43393 Other vitreous opacities, bilateral: Secondary | ICD-10-CM | POA: Diagnosis not present

## 2021-10-03 DIAGNOSIS — H524 Presbyopia: Secondary | ICD-10-CM | POA: Diagnosis not present

## 2021-10-03 DIAGNOSIS — H5203 Hypermetropia, bilateral: Secondary | ICD-10-CM | POA: Diagnosis not present

## 2021-10-03 DIAGNOSIS — H11133 Conjunctival pigmentations, bilateral: Secondary | ICD-10-CM | POA: Diagnosis not present

## 2021-10-03 DIAGNOSIS — H43823 Vitreomacular adhesion, bilateral: Secondary | ICD-10-CM | POA: Diagnosis not present

## 2021-10-04 NOTE — Progress Notes (Deleted)
No chief complaint on file.    HISTORY OF PRESENT ILLNESS:  10/04/21 ALL:  Kelly Haas is a 50 y.o. female here today for follow up for seizures.    12/07/20 SS: Kelly Haas is a 50 year old female with history of seizures.  She stopped Depakote due to reported hair loss she is on Lamictal.  Last seizure was in September 2021 while taking Lamictal 100 mg twice a day, was supposed to be taking 150 mg twice a day. Currently taking Lamictal 150 mg twice a day.  She continues to be living in her car, sleeps in her car most nights, or stays with friends. 1 night woke up the next morning in he car had chewed her cheek, unclear if seizure but was compliant with medication.  Sleep habits have been disrupted, parks in the [place] parking lot at night.  Is close to getting an apartment working with her Medicaid case worker.  Under a lot of stress in general.  Here today for evaluation unaccompanied.   REVIEW OF SYSTEMS: Out of a complete 14 system review of symptoms, the patient complains only of the following symptoms, and all other reviewed systems are negative.   ALLERGIES: Allergies  Allergen Reactions   Bee Venom Anaphylaxis   Ibuprofen Hives, Nausea And Vomiting and Rash    Other reaction(s): Vomiting (intolerance)   Acetaminophen    Aminolevulinate Derivatives    Aspirin-Acetaminophen-Caffeine    Dapsone     Other reaction(s): Other (See Comments)   Dimercaprol     Other reaction(s): Other (See Comments)   Methylcellulose    Nitrofuran Derivatives    Phenazopyridine    Primaquine    Toluidine Blue    Celebrex [Celecoxib] Hives and Rash   Latex Rash   Oxycodone Rash   Phenergan [Promethazine Hcl] Rash     HOME MEDICATIONS: Outpatient Medications Prior to Visit  Medication Sig Dispense Refill   amLODipine (NORVASC) 5 MG tablet Take 5 mg by mouth daily.      buPROPion (WELLBUTRIN XL) 300 MG 24 hr tablet Take 300 mg by mouth 2 (two) times daily.     clonazePAM  (KLONOPIN) 0.5 MG tablet Take 0.5 mg by mouth 2 (two) times daily as needed for anxiety.     cyclobenzaprine (FLEXERIL) 10 MG tablet Take 1 tablet (10 mg total) by mouth 3 (three) times daily as needed for Muscle spasms.  3   dicyclomine (BENTYL) 10 MG capsule Take 1 capsule (10 mg total) by mouth 4 (four) times daily as needed for spasms. 120 capsule 3   EPINEPHrine 0.3 mg/0.3 mL IJ SOAJ injection Inject 0.3 mg into the muscle as needed for anaphylaxis.     escitalopram (LEXAPRO) 20 MG tablet Take 20 mg by mouth daily.     furosemide (LASIX) 20 MG tablet Take 20 mg by mouth daily.     lamoTRIgine (LAMICTAL) 100 MG tablet Take 2 tablets (200 mg total) by mouth 2 (two) times daily. 360 tablet 6   LYRICA 50 MG capsule Take 1 capsule by mouth 2 (two) times daily.   5   nitroGLYCERIN (NITROSTAT) 0.4 MG SL tablet Place 0.4 mg under the tongue every 5 (five) minutes as needed for chest pain.     omeprazole (PRILOSEC) 40 MG capsule Take 40 mg by mouth daily.  5   ondansetron (ZOFRAN) 4 MG tablet Take 1 tablet (4 mg total) by mouth every 8 (eight) hours as needed for nausea or vomiting. 20 tablet 3  predniSONE (DELTASONE) 5 MG tablet Take 5 mg by mouth as needed for edema (swelling).     Progesterone Micronized (PROGESTERONE COMPOUNDING KIT) 20 % CREA Place 1 application onto the skin once.     rizatriptan (MAXALT) 10 MG tablet Take 1 tablet (10 mg total) by mouth 3 (three) times daily as needed for migraine. 10 tablet 3   topiramate (TOPAMAX) 25 MG tablet Take one tablet at night for one week, then take 2 tablets at night for one week, then take 3 tablets at night. 90 tablet 3   XOLAIR 150 MG/ML prefilled syringe Inject 150 mg into the skin every 30 (thirty) days. 2 injections     zolpidem (AMBIEN) 10 MG tablet Take 10 mg by mouth at bedtime as needed for sleep.  0   No facility-administered medications prior to visit.     PAST MEDICAL HISTORY: Past Medical History:  Diagnosis Date   Anxiety  disorder    Atypical squamous cell changes of undetermined significance (ASCUS) on vaginal cytology with positive high risk human papilloma virus (HPV) 09/11/2020   Formatting of this note might be different from the original. Many years ago before hyst cryo for abnormal pap 2021 ASCUS pap with pos HR HPV. Colpo-possible lesion- appears excoriated at the R cuff. Brushing shows inflammation with slightly positive  Ki67 index.10 days of metrogel. Recheck 3 weeks. Repeat pap in 6 mos. 03/2021 Nl pap with pos HR HPV   Bursitis, trochanteric 04/15/2013   Cardiac murmur 04/19/2021   Common migraine with intractable migraine 06/05/2021   Contracture of tendon sheath 11/26/2018   Depression with anxiety    Drug therapy 02/02/2018   Epistaxis 03/13/2016   Essential hypertension 04/19/2021   Fibromyalgia    G-6-PD deficiency 03/13/2016   GERD (gastroesophageal reflux disease)    Hyperemesis gravidarum 03/13/2016   Hyperlipidemia    Hypertension    Insomnia    Kidney stone    Laryngeal trauma 07/23/2012   Long term current use of opiate analgesic 03/12/2013   Migraine    Multiple joint pain 03/13/2016   Obesity (BMI 35.0-39.9 without comorbidity) 04/19/2021   Onychomycosis due to dermatophyte 11/03/2018   Osteoarthritis of left hip 03/12/2013   Ovarian cyst 03/13/2016   Pain due to onychomycosis of toenail of left foot 02/19/2018   Pain syndrome, chronic 03/12/2013   Peripheral polyneuropathy 02/02/2018   Pharyngitis 03/13/2016   Positive ANA (antinuclear antibody) 03/13/2016   Sacroiliac pain 03/12/2013   Seizure disorder ([place]) 11/13/2017   Syncope and collapse 01/01/2016   Thoracic spondylosis 03/12/2013   Thyroid disease    Vaginal bleeding 05/20/2019   Formatting of this note might be different from the original.  Prior hyst ~2005 for benign indications Bleeding 05/2019 Postcoital bleeding reported 08/2020   Voice hoarseness 02/02/2018     PAST SURGICAL HISTORY: Past Surgical History:  Procedure Laterality Date    ABDOMINAL HYSTERECTOMY     APPENDECTOMY     CHOLECYSTECTOMY     ESOPHAGOGASTRODUODENOSCOPY  08/29/2017   Mild gastritis. Stauts post empiric esophageal dilitation.    TOE SURGERY Left    removed toe nail   TONSILLECTOMY AND ADENOIDECTOMY       FAMILY HISTORY: Family History  Problem Relation Age of Onset   Cancer Mother    Diabetes Mother    Hypertension Mother    Kidney cancer Mother    Heart attack Father        19   Seizures Father    Thyroid cancer  Sister        Patient reported   Hypertension Maternal Grandmother    Lung cancer Maternal Grandmother    Stroke Maternal Grandmother    Heart attack Maternal Grandmother    Hypertension Maternal Grandfather    Colon cancer Maternal Grandfather    Pancreatic cancer Maternal Grandfather    Prostate cancer Maternal Grandfather    Lung cancer Maternal Grandfather    Hypertension Paternal Grandmother    Hypertension Paternal Grandfather    Thyroid cancer Maternal Aunt      SOCIAL HISTORY: Social History   Socioeconomic History   Marital status: Single    Spouse name: Not on file   Number of children: 3   Years of education: 14   Highest education level: Associate degree: occupational, Hotel manager, or vocational program  Occupational History   Occupation: Disabled  Tobacco Use   Smoking status: Never   Smokeless tobacco: Never  Vaping Use   Vaping Use: Never used  Substance and Sexual Activity   Alcohol use: No   Drug use: No   Sexual activity: Not on file  Other Topics Concern   Not on file  Social History Narrative   Lives at home with daughter, son and grandson.   Right-handed.   No caffeine use.   Social Determinants of Health   Financial Resource Strain: Not on file  Food Insecurity: Not on file  Transportation Needs: Not on file  Physical Activity: Not on file  Stress: Not on file  Social Connections: Not on file  Intimate Partner Violence: Not on file     PHYSICAL EXAM  There were no  vitals filed for this visit. There is no height or weight on file to calculate BMI.  Generalized: Well developed, in no acute distress  Cardiology: normal rate and rhythm, no murmur auscultated  Respiratory: clear to auscultation bilaterally    Neurological examination  Mentation: Alert oriented to time, place, history taking. Follows all commands speech and language fluent Cranial nerve II-XII: Pupils were equal round reactive to light. Extraocular movements were full, visual field were full on confrontational test. Facial sensation and strength were normal. Uvula tongue midline. Head turning and shoulder shrug  were normal and symmetric. Motor: The motor testing reveals 5 over 5 strength of all 4 extremities. Good symmetric motor tone is noted throughout.  Sensory: Sensory testing is intact to soft touch on all 4 extremities. No evidence of extinction is noted.  Coordination: Cerebellar testing reveals good finger-nose-finger and heel-to-shin bilaterally.  Gait and station: Gait is normal. Tandem gait is normal. Romberg is negative. No drift is seen.  Reflexes: Deep tendon reflexes are symmetric and normal bilaterally.    DIAGNOSTIC DATA (LABS, IMAGING, TESTING) - I reviewed patient records, labs, notes, testing and imaging myself where available.  Lab Results  Component Value Date   WBC 7.0 12/07/2020   HGB 13.9 12/07/2020   HCT 41.6 12/07/2020   MCV 92 12/07/2020   PLT 363 12/07/2020      Component Value Date/Time   NA 143 12/07/2020 1315   K 4.2 12/07/2020 1315   CL 106 12/07/2020 1315   CO2 23 12/07/2020 1315   GLUCOSE 91 12/07/2020 1315   GLUCOSE 100 (H) 08/03/2020 0046   BUN 11 12/07/2020 1315   CREATININE 1.02 (H) 12/07/2020 1315   CALCIUM 9.6 12/07/2020 1315   PROT 6.8 12/07/2020 1315   ALBUMIN 4.0 12/07/2020 1315   AST 20 12/07/2020 1315   ALT 19 12/07/2020 1315  ALKPHOS 86 12/07/2020 1315   BILITOT 0.3 12/07/2020 1315   GFRNONAA 65 12/07/2020 1315   GFRAA  75 12/07/2020 1315   No results found for: CHOL, HDL, LDLCALC, LDLDIRECT, TRIG, CHOLHDL No results found for: HGBA1C No results found for: VITAMINB12 Lab Results  Component Value Date   TSH 0.06 (L) 04/10/2021    No flowsheet data found.   No flowsheet data found.   ASSESSMENT AND PLAN  50 y.o. year old female  has a past medical history of Anxiety disorder, Atypical squamous cell changes of undetermined significance (ASCUS) on vaginal cytology with positive high risk human papilloma virus (HPV) (09/11/2020), Bursitis, trochanteric (04/15/2013), Cardiac murmur (04/19/2021), Common migraine with intractable migraine (06/05/2021), Contracture of tendon sheath (11/26/2018), Depression with anxiety, Drug therapy (02/02/2018), Epistaxis (03/13/2016), Essential hypertension (04/19/2021), Fibromyalgia, G-6-PD deficiency (03/13/2016), GERD (gastroesophageal reflux disease), Hyperemesis gravidarum (03/13/2016), Hyperlipidemia, Hypertension, Insomnia, Kidney stone, Laryngeal trauma (07/23/2012), Long term current use of opiate analgesic (03/12/2013), Migraine, Multiple joint pain (03/13/2016), Obesity (BMI 35.0-39.9 without comorbidity) (04/19/2021), Onychomycosis due to dermatophyte (11/03/2018), Osteoarthritis of left hip (03/12/2013), Ovarian cyst (03/13/2016), Pain due to onychomycosis of toenail of left foot (02/19/2018), Pain syndrome, chronic (03/12/2013), Peripheral polyneuropathy (02/02/2018), Pharyngitis (03/13/2016), Positive ANA (antinuclear antibody) (03/13/2016), Sacroiliac pain (03/12/2013), Seizure disorder ([place]) (11/13/2017), Syncope and collapse (01/01/2016), Thoracic spondylosis (03/12/2013), Thyroid disease, Vaginal bleeding (05/20/2019), and Voice hoarseness (02/02/2018). here with    No diagnosis found.   No orders of the defined types were placed in this encounter.    No orders of the defined types were placed in this encounter.     Debbora Presto, MSN, FNP-C 10/04/2021, 3:38 PM  [facility] [address] [place], [place] [address] [phone]

## 2021-10-08 ENCOUNTER — Encounter: Payer: Self-pay | Admitting: Family Medicine

## 2021-10-08 ENCOUNTER — Ambulatory Visit: Payer: Medicare Other | Admitting: Family Medicine

## 2021-10-15 ENCOUNTER — Ambulatory Visit (INDEPENDENT_AMBULATORY_CARE_PROVIDER_SITE_OTHER): Payer: Medicare Other | Admitting: Endocrinology

## 2021-10-15 ENCOUNTER — Other Ambulatory Visit: Payer: Self-pay

## 2021-10-15 ENCOUNTER — Encounter: Payer: Self-pay | Admitting: Endocrinology

## 2021-10-15 VITALS — BP 122/84 | HR 100 | Ht 65.0 in | Wt 241.0 lb

## 2021-10-15 DIAGNOSIS — E042 Nontoxic multinodular goiter: Secondary | ICD-10-CM

## 2021-10-15 DIAGNOSIS — R7989 Other specified abnormal findings of blood chemistry: Secondary | ICD-10-CM | POA: Diagnosis not present

## 2021-10-15 NOTE — Progress Notes (Signed)
Patient ID: Kelly Haas, female   DOB: 01/02/1971, 50 y.o.   MRN: [id number]           Chief complaint: Follow-up of thyroid    History of Present Illness:   Baseline history on initial consultation as follows: The patient's thyroid dysfunction was first discovered in 2002 after her pregnancy At that time not clear what her symptoms were but she was told that she needed to be on thyroid medication by an endocrinologist who saw her She did not think she felt any different with taking the thyroid supplement She was being followed for some time and her dose was adjusted finally to taking levothyroxine 75 alternating with 50 mcg  However at some point she stopped taking the supplement because she did not feel any different She will subsequently told by her PCP to start back on the supplements about 7 or 8 years ago because of abnormal blood test but she was not having any symptoms.  Again she did not feel any different with taking the thyroid supplement  RECENT HISTORY:  She has been off levothyroxine supplements for a few years because of tendency to low TSH Last TSH in December 21 was normal  GOITER:   She is complaining of persistent discomfort in the left neck and tenderness in that area The symptoms are present most of the day but not exacerbated with swallowing  She did not follow-up with her PCP as recommended in August  Previous TSH done recently by her PCP is normal at 0.47, prior to that was 0.06 No palpitations and this year has been having some hot flashes  No new symptoms of choking or difficulty swallowing   Last ultrasound of the thyroid in 8/22 showed the following report:  1. Similar findings of borderline thyromegaly and multinodular goiter. No new or enlarging thyroid nodules. 2. None of the discretely measured thyroid nodules/cysts meet imaging criteria to recommend percutaneous sampling or continued follow-up.   1. Enlarged multinodular goiter  2.  Bilateral thyroid nodules, the majority of which are cystic and do not require FNA or follow-up. 3. 1.9 cm spongiform thyroid nodule in the left inferior thyroid gland. This does not meet criteria for follow-up or FNA. 4. Solid 0.9 cm nodule in the isthmus that does not meet criteria for follow-up or FNA. 5.  Thyroid enlargement present with size of each lobe of about 6.5 cm  Wt Readings from Last 3 Encounters:  10/15/21 241 lb (109.3 kg)  07/03/21 229 lb 9.6 oz (104.1 kg)  06/05/21 230 lb (104.3 kg)    Thyroid function studies:  Lab Results  Component Value Date   FREET4 0.91 04/10/2021   FREET4 0.76 11/07/2020   FREET4 1.30 05/08/2020   TSH 0.06 (L) 04/10/2021   TSH 0.65 11/07/2020   TSH 0.32 (L) 05/08/2020   Lab Results  Component Value Date   T3FREE 2.9 04/10/2021   T3FREE 3.0 11/07/2020   T3FREE 3.8 05/08/2020   T3FREE 3.1 11/08/2019    TSH on 08/23/2019 was 0.31 with free T4 1.22 and free T3 3.96    Allergies as of 10/15/2021       Reactions   Bee Venom Anaphylaxis   Ibuprofen Hives, Nausea And Vomiting, Rash   Other reaction(s): Vomiting (intolerance)   Acetaminophen    Aminolevulinate Derivatives    Aspirin-acetaminophen-caffeine    Dapsone    Other reaction(s): Other (See Comments)   Dimercaprol    Other reaction(s): Other (See Comments)   Methylcellulose  Nitrofuran Derivatives    Phenazopyridine    Primaquine    Toluidine Blue    Celebrex [celecoxib] Hives, Rash   Latex Rash   Oxycodone Rash   Phenergan [promethazine Hcl] Rash        Medication List        Accurate as of October 15, 2021  4:09 PM. If you have any questions, ask your nurse or doctor.          amLODipine 5 MG tablet Commonly known as: NORVASC Take 5 mg by mouth daily.   buPROPion 300 MG 24 hr tablet Commonly known as: WELLBUTRIN XL Take 300 mg by mouth 2 (two) times daily.   clonazePAM 0.5 MG tablet Commonly known as: KLONOPIN Take 0.5 mg by mouth 2 (two)  times daily as needed for anxiety.   cyclobenzaprine 10 MG tablet Commonly known as: FLEXERIL Take 1 tablet (10 mg total) by mouth 3 (three) times daily as needed for Muscle spasms.   dicyclomine 10 MG capsule Commonly known as: BENTYL Take 1 capsule (10 mg total) by mouth 4 (four) times daily as needed for spasms.   EPINEPHrine 0.3 mg/0.3 mL Soaj injection Commonly known as: EPI-PEN Inject 0.3 mg into the muscle as needed for anaphylaxis.   escitalopram 20 MG tablet Commonly known as: LEXAPRO Take 20 mg by mouth daily.   furosemide 20 MG tablet Commonly known as: LASIX Take 20 mg by mouth daily.   lamoTRIgine 100 MG tablet Commonly known as: LaMICtal Take 2 tablets (200 mg total) by mouth 2 (two) times daily.   Lyrica 50 MG capsule Generic drug: pregabalin Take 1 capsule by mouth 2 (two) times daily.   nitroGLYCERIN 0.4 MG SL tablet Commonly known as: NITROSTAT Place 0.4 mg under the tongue every 5 (five) minutes as needed for chest pain.   omeprazole 40 MG capsule Commonly known as: PRILOSEC Take 40 mg by mouth daily.   ondansetron 4 MG tablet Commonly known as: Zofran Take 1 tablet (4 mg total) by mouth every 8 (eight) hours as needed for nausea or vomiting.   predniSONE 5 MG tablet Commonly known as: DELTASONE Take 5 mg by mouth as needed for edema (swelling).   Progesterone Compounding Kit 20 % Crea Place 1 application onto the skin once.   rizatriptan 10 MG tablet Commonly known as: Maxalt Take 1 tablet (10 mg total) by mouth 3 (three) times daily as needed for migraine.   topiramate 25 MG tablet Commonly known as: TOPAMAX Take one tablet at night for one week, then take 2 tablets at night for one week, then take 3 tablets at night.   Xolair 150 MG/ML prefilled syringe Generic drug: omalizumab Inject 150 mg into the skin every 30 (thirty) days. 2 injections   zolpidem 10 MG tablet Commonly known as: AMBIEN Take 10 mg by mouth at bedtime as needed  for sleep.        Allergies:  Allergies  Allergen Reactions   Bee Venom Anaphylaxis   Ibuprofen Hives, Nausea And Vomiting and Rash    Other reaction(s): Vomiting (intolerance)   Acetaminophen    Aminolevulinate Derivatives    Aspirin-Acetaminophen-Caffeine    Dapsone     Other reaction(s): Other (See Comments)   Dimercaprol     Other reaction(s): Other (See Comments)   Methylcellulose    Nitrofuran Derivatives    Phenazopyridine    Primaquine    Toluidine Blue    Celebrex [Celecoxib] Hives and Rash   Latex Rash  Oxycodone Rash   Phenergan [Promethazine Hcl] Rash    Past Medical History:  Diagnosis Date   Anxiety disorder    Atypical squamous cell changes of undetermined significance (ASCUS) on vaginal cytology with positive high risk human papilloma virus (HPV) 09/11/2020   Formatting of this note might be different from the original. Many years ago before hyst cryo for abnormal pap 2021 ASCUS pap with pos HR HPV. Colpo-possible lesion- appears excoriated at the R cuff. Brushing shows inflammation with slightly positive  Ki67 index.10 days of metrogel. Recheck 3 weeks. Repeat pap in 6 mos. 03/2021 Nl pap with pos HR HPV   Bursitis, trochanteric 04/15/2013   Cardiac murmur 04/19/2021   Common migraine with intractable migraine 06/05/2021   Contracture of tendon sheath 11/26/2018   Depression with anxiety    Drug therapy 02/02/2018   Epistaxis 03/13/2016   Essential hypertension 04/19/2021   Fibromyalgia    G-6-PD deficiency 03/13/2016   GERD (gastroesophageal reflux disease)    Hyperemesis gravidarum 03/13/2016   Hyperlipidemia    Hypertension    Insomnia    Kidney stone    Laryngeal trauma 07/23/2012   Long term current use of opiate analgesic 03/12/2013   Migraine    Multiple joint pain 03/13/2016   Obesity (BMI 35.0-39.9 without comorbidity) 04/19/2021   Onychomycosis due to dermatophyte 11/03/2018   Osteoarthritis of left hip 03/12/2013   Ovarian cyst 03/13/2016   Pain due  to onychomycosis of toenail of left foot 02/19/2018   Pain syndrome, chronic 03/12/2013   Peripheral polyneuropathy 02/02/2018   Pharyngitis 03/13/2016   Positive ANA (antinuclear antibody) 03/13/2016   Sacroiliac pain 03/12/2013   Seizure disorder ([place]) 11/13/2017   Syncope and collapse 01/01/2016   Thoracic spondylosis 03/12/2013   Thyroid disease    Vaginal bleeding 05/20/2019   Formatting of this note might be different from the original.  Prior hyst ~2005 for benign indications Bleeding 05/2019 Postcoital bleeding reported 08/2020   Voice hoarseness 02/02/2018    Past Surgical History:  Procedure Laterality Date   ABDOMINAL HYSTERECTOMY     APPENDECTOMY     CHOLECYSTECTOMY     ESOPHAGOGASTRODUODENOSCOPY  08/29/2017   Mild gastritis. Stauts post empiric esophageal dilitation.    TOE SURGERY Left    removed toe nail   TONSILLECTOMY AND ADENOIDECTOMY      Family History  Problem Relation Age of Onset   Cancer Mother    Diabetes Mother    Hypertension Mother    Kidney cancer Mother    Heart attack Father        6   Seizures Father    Thyroid cancer Sister        Patient reported   Hypertension Maternal Grandmother    Lung cancer Maternal Grandmother    Stroke Maternal Grandmother    Heart attack Maternal Grandmother    Hypertension Maternal Grandfather    Colon cancer Maternal Grandfather    Pancreatic cancer Maternal Grandfather    Prostate cancer Maternal Grandfather    Lung cancer Maternal Grandfather    Hypertension Paternal Grandmother    Hypertension Paternal Grandfather    Thyroid cancer Maternal Aunt     Social History:  reports that she has never smoked. She has never used smokeless tobacco. She reports that she does not drink alcohol and does not use drugs.   Review of Systems     Examination:   BP 122/84   Pulse 100   Ht '5\' 5"'  (1.651 m)   Wt  241 lb (109.3 kg)   SpO2 95%   BMI 40.10 kg/m      THYROID exam:  Right lobe is diffusely enlarged, 1.5-2  times normal, smooth surface Left lobe just palpable and about 1-1/2 times normal smooth and relatively soft without any nodules Mild tenderness over the right lobe but not on the left No lymphadenopathy in the neck No tenderness over the sternomastoid muscles   Assessment/Plan:   Multinodular goiter with mostly small or cystic nodules These were unchanged when she had an ultrasound done in August Overall thyroid enlargement is stable and relatively small from external palpation Previously had some tenderness on the left side but now is slightly tender on the right lobe  However her symptoms of neck discomfort and tenderness in the left side are not explained by her thyroid enlargement or any enlargement of her cysts  Recommend follow-up with PCP Thyroid levels will be rechecked today If labs are normal she will follow-up in 1 year   Elayne Snare 10/15/2021

## 2021-10-16 LAB — T3, FREE: T3, Free: 3.2 pg/mL (ref 2.3–4.2)

## 2021-10-16 LAB — T4, FREE: Free T4: 0.9 ng/dL (ref 0.60–1.60)

## 2021-10-16 LAB — TSH: TSH: 0.5 u[IU]/mL (ref 0.35–5.50)

## 2021-10-17 NOTE — Progress Notes (Signed)
Please call to let patient know that the lab results are normal and she can come back annually now

## 2021-10-18 ENCOUNTER — Telehealth: Payer: Self-pay

## 2021-10-18 ENCOUNTER — Other Ambulatory Visit: Payer: Self-pay

## 2021-10-18 ENCOUNTER — Encounter: Payer: Self-pay | Admitting: Gastroenterology

## 2021-10-18 ENCOUNTER — Ambulatory Visit (INDEPENDENT_AMBULATORY_CARE_PROVIDER_SITE_OTHER): Payer: Medicare Other | Admitting: Gastroenterology

## 2021-10-18 VITALS — BP 158/92 | HR 86 | Ht 65.0 in | Wt 237.2 lb

## 2021-10-18 DIAGNOSIS — K581 Irritable bowel syndrome with constipation: Secondary | ICD-10-CM

## 2021-10-18 DIAGNOSIS — K219 Gastro-esophageal reflux disease without esophagitis: Secondary | ICD-10-CM

## 2021-10-18 MED ORDER — OMEPRAZOLE 40 MG PO CPDR: 40.0000 mg | DELAYED_RELEASE_CAPSULE | Freq: Two times a day (BID) | ORAL | 11 refills | Status: DC

## 2021-10-18 MED ORDER — DICYCLOMINE HCL 10 MG PO CAPS
10.0000 mg | ORAL_CAPSULE | Freq: Four times a day (QID) | ORAL | 6 refills | Status: DC | PRN
Start: 2021-10-18 — End: 2022-08-23

## 2021-10-18 MED ORDER — HYOSCYAMINE SULFATE 0.125 MG SL SUBL: 0.1250 mg | SUBLINGUAL_TABLET | Freq: Four times a day (QID) | SUBLINGUAL | 1 refills | Status: DC | PRN

## 2021-10-18 NOTE — Patient Instructions (Addendum)
If you are age 49 or older, your body mass index should be between 23-30. Your Body mass index is 39.48 kg/m. If this is out of the aforementioned range listed, please consider follow up with your Primary Care Provider.  If you are age 16 or younger, your body mass index should be between 19-25. Your Body mass index is 39.48 kg/m. If this is out of the aformentioned range listed, please consider follow up with your Primary Care Provider.   __________________________________________________________  The [facility] providers would like to encourage you to use [facility] to communicate with providers for non-urgent requests or questions.  Due to long hold times on the telephone, sending your provider a message by [facility] may be a faster and more efficient way to get a response.  Please allow 48 business hours for a response.  Please remember that this is for non-urgent requests.     - Continue omeprazole 40 mg p.o. BID  We have sent the following medications to your pharmacy for you to pick up at your convenience:  - Continue Bentyl 10mg  po qid  prn. #120, 6 refills.  This may worsen your constiptation. - Add Levsin 0.125mg  S/L Q6hrs prn for severe pain. #60.  You have been scheduled for an endoscopy. Please follow written instructions given to you at your visit today. If you use inhalers (even only as needed), please bring them with you on the day of your procedure.  Please call with any questions or concerns.  Thank you,  Dr. Jackquline Denmark

## 2021-10-18 NOTE — Progress Notes (Signed)
Chief Complaint: Abdominal pain  Referring Provider:  Cher Nakai, MD      ASSESSMENT AND PLAN;   #1. GERD with epi pain- neg EGD 08/2017 with neg eso bx for EoE. S/p lap chole in the past. Neg solid-phase GES 08/2016, neg CT abdo/pel 05/2018, neg CTA 03/12/2019.  Likely functional dyspepsia/abdominal wall pain.   #2.  IBS-C, neg colon 07/04/2017 except for small posterior anal fissure.  Plan: - EGD with dil - Continue omeprazole 40 mg p.o. BID (may have to switch her to Protonix depending upon EGD results) - Continue Bentyl 40m po qid  prn. #120, 6 refills.  She understands that it may cause worsening constipation. - Add Levsin 0.1226mS/L Q6hrs prn for severe pain. #60. - Check CBC, CMP and lipase at the time of EGD. - Continue Zofran prn as prescribed by Dr. LeTruman Hayward HPI:    Kelly Haas a [age].o. female  With H/O goiter For follow-up visit  Nausea even when she smells something. Having N/V, somewhat better with zofran. Postprandial epi pain. Having dysphagia now with both solids and liquids. No heartburn. No particular food intolerence. No NSAIDs.  No constipation. Has been using 2 Gummies per day.  Denies having any significant constipation.  Having BMs 1/every other day.  No melena or hematochezia.  Negative CTA Abdo/pelvis 03/12/2019  Would like to have medication refill-Bentyl.  Wt Readings from Last 3 Encounters:  10/18/21 237 lb 4 oz (107.6 kg)  10/15/21 241 lb (109.3 kg)  07/03/21 229 lb 9.6 oz (104.1 kg)   Stress eater.  Has gained weight   Wt Readings from Last 3 Encounters:  10/18/21 237 lb 4 oz (107.6 kg)  10/15/21 241 lb (109.3 kg)  07/03/21 229 lb 9.6 oz (104.1 kg)     Past Medical History:  Diagnosis Date   Anxiety disorder    Atypical squamous cell changes of undetermined significance (ASCUS) on vaginal cytology with positive high risk human papilloma virus (HPV) 09/11/2020   Formatting of this note might be different from the original.  Many years ago before hyst cryo for abnormal pap 2021 ASCUS pap with pos HR HPV. Colpo-possible lesion- appears excoriated at the R cuff. Brushing shows inflammation with slightly positive  Ki67 index.10 days of metrogel. Recheck 3 weeks. Repeat pap in 6 mos. 03/2021 Nl pap with pos HR HPV   Bursitis, trochanteric 04/15/2013   Cardiac murmur 04/19/2021   Common migraine with intractable migraine 06/05/2021   Contracture of tendon sheath 11/26/2018   Depression with anxiety    Drug therapy 02/02/2018   Epistaxis 03/13/2016   Essential hypertension 04/19/2021   Fibromyalgia    G-6-PD deficiency 03/13/2016   GERD (gastroesophageal reflux disease)    Hyperemesis gravidarum 03/13/2016   Hyperlipidemia    Hypertension    Insomnia    Kidney stone    Laryngeal trauma 07/23/2012   Long term current use of opiate analgesic 03/12/2013   Migraine    Multiple joint pain 03/13/2016   Obesity (BMI 35.0-39.9 without comorbidity) 04/19/2021   Onychomycosis due to dermatophyte 11/03/2018   Osteoarthritis of left hip 03/12/2013   Ovarian cyst 03/13/2016   Pain due to onychomycosis of toenail of left foot 02/19/2018   Pain syndrome, chronic 03/12/2013   Peripheral polyneuropathy 02/02/2018   Pharyngitis 03/13/2016   Positive ANA (antinuclear antibody) 03/13/2016   Sacroiliac pain 03/12/2013   Seizure disorder ([facility]/27/2018   Syncope and collapse 01/01/2016   Thoracic spondylosis 03/12/2013  Thyroid disease    Vaginal bleeding 05/20/2019   Formatting of this note might be different from the original.  Prior hyst ~2005 for benign indications Bleeding 05/2019 Postcoital bleeding reported 08/2020   Voice hoarseness 02/02/2018    Past Surgical History:  Procedure Laterality Date   ABDOMINAL HYSTERECTOMY     APPENDECTOMY     CHOLECYSTECTOMY     ESOPHAGOGASTRODUODENOSCOPY  08/29/2017   Mild gastritis. Stauts post empiric esophageal dilitation.    TOE SURGERY Left    removed toe nail   TONSILLECTOMY AND ADENOIDECTOMY       Family History  Problem Relation Age of Onset   Cancer Mother    Diabetes Mother    Hypertension Mother    Kidney cancer Mother    Heart attack Father        70   Seizures Father    Thyroid cancer Sister        Patient reported   Hypertension Maternal Grandmother    Lung cancer Maternal Grandmother    Stroke Maternal Grandmother    Heart attack Maternal Grandmother    Hypertension Maternal Grandfather    Colon cancer Maternal Grandfather    Pancreatic cancer Maternal Grandfather    Prostate cancer Maternal Grandfather    Lung cancer Maternal Grandfather    Hypertension Paternal Grandmother    Hypertension Paternal Grandfather    Thyroid cancer Maternal Aunt     Social History   Tobacco Use   Smoking status: Never   Smokeless tobacco: Never  Vaping Use   Vaping Use: Never used  Substance Use Topics   Alcohol use: No   Drug use: No    Current Outpatient Medications  Medication Sig Dispense Refill   amLODipine (NORVASC) 5 MG tablet Take 5 mg by mouth daily.      buPROPion (WELLBUTRIN XL) 300 MG 24 hr tablet Take 300 mg by mouth 2 (two) times daily.     clonazePAM (KLONOPIN) 0.5 MG tablet Take 0.5 mg by mouth 2 (two) times daily as needed for anxiety.     cyclobenzaprine (FLEXERIL) 10 MG tablet Take 1 tablet (10 mg total) by mouth 3 (three) times daily as needed for Muscle spasms.  3   dicyclomine (BENTYL) 10 MG capsule Take 1 capsule (10 mg total) by mouth 4 (four) times daily as needed for spasms. 120 capsule 3   EPINEPHrine 0.3 mg/0.3 mL IJ SOAJ injection Inject 0.3 mg into the muscle as needed for anaphylaxis.     escitalopram (LEXAPRO) 20 MG tablet Take 20 mg by mouth daily.     furosemide (LASIX) 20 MG tablet Take 20 mg by mouth daily.     lamoTRIgine (LAMICTAL) 100 MG tablet Take 2 tablets (200 mg total) by mouth 2 (two) times daily. 360 tablet 6   LYRICA 50 MG capsule Take 1 capsule by mouth 2 (two) times daily.   5   nitroGLYCERIN (NITROSTAT) 0.4 MG SL  tablet Place 0.4 mg under the tongue every 5 (five) minutes as needed for chest pain.     omeprazole (PRILOSEC) 40 MG capsule Take 40 mg by mouth daily.  5   ondansetron (ZOFRAN) 4 MG tablet Take 1 tablet (4 mg total) by mouth every 8 (eight) hours as needed for nausea or vomiting. 20 tablet 3   predniSONE (DELTASONE) 5 MG tablet Take 5 mg by mouth as needed for edema (swelling).     rizatriptan (MAXALT) 10 MG tablet Take 1 tablet (10 mg total) by mouth 3 (  three) times daily as needed for migraine. 10 tablet 3   XOLAIR 150 MG/ML prefilled syringe Inject 150 mg into the skin every 30 (thirty) days. 2 injections     zolpidem (AMBIEN) 10 MG tablet Take 10 mg by mouth at bedtime as needed for sleep.  0   No current facility-administered medications for this visit.    Allergies  Allergen Reactions   Bee Venom Anaphylaxis   Ibuprofen Hives, Nausea And Vomiting and Rash    Other reaction(s): Vomiting (intolerance)   Acetaminophen    Aminolevulinate Derivatives    Aspirin-Acetaminophen-Caffeine    Dapsone     Other reaction(s): Other (See Comments)   Dimercaprol     Other reaction(s): Other (See Comments)   Methylcellulose    Nitrofuran Derivatives    Phenazopyridine    Primaquine    Toluidine Blue    Celebrex [Celecoxib] Hives and Rash   Latex Rash   Oxycodone Rash   Phenergan [Promethazine Hcl] Rash    Review of Systems:  Reviewed     Physical Exam:    BP (!) 158/92 (BP Location: Right Arm, Patient Position: Sitting, Cuff Size: Normal)   Pulse 86   Ht _0  (1.651 m)   Wt 237 lb 4 oz (107.6 kg)   SpO2 99%   BMI 39.48 kg/m    Gen: awake, alert, NAD HEENT: anicteric, no pallor CV: RRR, no mrg Pulm: CTA b/l Abd: soft, NT/ND, +BS throughout Ext: no c/c/e Neuro: nonfocal   Data Reviewed: I have personally reviewed following labs and imaging studies  CBC: CBC Latest Ref Rng & Units 12/07/2020 08/03/2020 06/20/2020  WBC 3.4 - 10.8 x10E3/uL 7.0 11.6(H) 7.4  Hemoglobin  11.1 - 15.9 g/dL 13.9 12.7 13.0  Hematocrit 34.0 - 46.6 % 41.6 39.3 38.7  Platelets 150 - 450 x10E3/uL 363 365 356    CMP: CMP Latest Ref Rng & Units 12/07/2020 08/03/2020 06/20/2020  Glucose 65 - 99 mg/dL 91 100(H) 97  BUN 6 - 24 mg/dL _1 Creatinine 0.57 - 1.00 mg/dL 1.02(H) 1.03(H) 1.10(H)  Sodium 134 - 144 mmol/L 143 140 141  Potassium 3.5 - 5.2 mmol/L 4.2 3.8 4.4  Chloride 96 - 106 mmol/L 106 106 104  CO2 20 - 29 mmol/L _2 Calcium 8.7 - 10.2 mg/dL 9.6 9.4 9.4  Total Protein 6.0 - 8.5 g/dL 6.8 7.1 6.5  Total Bilirubin 0.0 - 1.2 mg/dL 0.3 0.5 0.2  Alkaline Phos 44 - 121 IU/L 86 67 96  AST 0 - 40 IU/L _3 ALT 0 - 32 IU/L _4 Carmell Austria, MD 10/18/2021, 2:30 PM  Cc: Cher Nakai, MD

## 2021-10-18 NOTE — Telephone Encounter (Signed)
-----   Message from Elayne Snare, MD sent at 10/17/2021 11:39 AM EST ----- Please call to let patient know that the lab results are normal and she can come back annually now

## 2021-10-18 NOTE — Telephone Encounter (Signed)
Patient has now been informed of her lab results.

## 2021-10-23 DIAGNOSIS — R110 Nausea: Secondary | ICD-10-CM | POA: Diagnosis not present

## 2021-10-23 DIAGNOSIS — F419 Anxiety disorder, unspecified: Secondary | ICD-10-CM | POA: Diagnosis not present

## 2021-10-23 DIAGNOSIS — R519 Headache, unspecified: Secondary | ICD-10-CM | POA: Diagnosis not present

## 2021-10-23 DIAGNOSIS — G4762 Sleep related leg cramps: Secondary | ICD-10-CM | POA: Diagnosis not present

## 2021-10-23 DIAGNOSIS — E039 Hypothyroidism, unspecified: Secondary | ICD-10-CM | POA: Diagnosis not present

## 2021-10-23 DIAGNOSIS — G5620 Lesion of ulnar nerve, unspecified upper limb: Secondary | ICD-10-CM | POA: Diagnosis not present

## 2021-10-23 DIAGNOSIS — R55 Syncope and collapse: Secondary | ICD-10-CM | POA: Diagnosis not present

## 2021-10-23 DIAGNOSIS — R5382 Chronic fatigue, unspecified: Secondary | ICD-10-CM | POA: Diagnosis not present

## 2021-10-23 DIAGNOSIS — G4700 Insomnia, unspecified: Secondary | ICD-10-CM | POA: Diagnosis not present

## 2021-10-23 DIAGNOSIS — M159 Polyosteoarthritis, unspecified: Secondary | ICD-10-CM | POA: Diagnosis not present

## 2021-10-23 DIAGNOSIS — M797 Fibromyalgia: Secondary | ICD-10-CM | POA: Diagnosis not present

## 2021-10-23 DIAGNOSIS — E042 Nontoxic multinodular goiter: Secondary | ICD-10-CM | POA: Diagnosis not present

## 2021-10-25 ENCOUNTER — Other Ambulatory Visit: Payer: Self-pay

## 2021-10-25 ENCOUNTER — Other Ambulatory Visit (INDEPENDENT_AMBULATORY_CARE_PROVIDER_SITE_OTHER): Payer: Medicare Other

## 2021-10-25 ENCOUNTER — Ambulatory Visit (AMBULATORY_SURGERY_CENTER): Payer: Medicare Other | Admitting: Gastroenterology

## 2021-10-25 ENCOUNTER — Encounter: Payer: Self-pay | Admitting: Gastroenterology

## 2021-10-25 VITALS — BP 123/92 | HR 83 | Temp 98.4°F | Resp 24 | Ht 65.0 in | Wt 237.0 lb

## 2021-10-25 DIAGNOSIS — R1013 Epigastric pain: Secondary | ICD-10-CM

## 2021-10-25 DIAGNOSIS — R112 Nausea with vomiting, unspecified: Secondary | ICD-10-CM | POA: Diagnosis not present

## 2021-10-25 DIAGNOSIS — K219 Gastro-esophageal reflux disease without esophagitis: Secondary | ICD-10-CM

## 2021-10-25 DIAGNOSIS — R1310 Dysphagia, unspecified: Secondary | ICD-10-CM

## 2021-10-25 DIAGNOSIS — K2970 Gastritis, unspecified, without bleeding: Secondary | ICD-10-CM | POA: Diagnosis not present

## 2021-10-25 LAB — LIPASE: Lipase: 6 U/L — ABNORMAL LOW (ref 11.0–59.0)

## 2021-10-25 LAB — COMPREHENSIVE METABOLIC PANEL
ALT: 10 U/L (ref 0–35)
AST: 15 U/L (ref 0–37)
Albumin: 4 g/dL (ref 3.5–5.2)
Alkaline Phosphatase: 101 U/L (ref 39–117)
BUN: 7 mg/dL (ref 6–23)
CO2: 31 mEq/L (ref 19–32)
Calcium: 9.4 mg/dL (ref 8.4–10.5)
Chloride: 105 mEq/L (ref 96–112)
Creatinine, Ser: 0.99 mg/dL (ref 0.40–1.20)
GFR: 66.77 mL/min (ref >60.00)
Glucose, Bld: 99 mg/dL (ref 70–99)
Potassium: 3.9 mEq/L (ref 3.5–5.1)
Sodium: 142 mEq/L (ref 135–145)
Total Bilirubin: 0.5 mg/dL (ref 0.2–1.2)
Total Protein: 7 g/dL (ref 6.0–8.3)

## 2021-10-25 MED ORDER — ONDANSETRON HCL 40 MG/20ML IJ SOLN
4.0000 mg | Freq: Once | INTRAMUSCULAR | Status: AC
Start: 2021-10-25 — End: 2021-10-25
  Administered 2021-10-25: 4 mg via INTRAVENOUS

## 2021-10-25 MED ORDER — SODIUM CHLORIDE 0.9 % IV SOLN: 500.0000 mL | Freq: Once | INTRAVENOUS | Status: DC

## 2021-10-25 MED ORDER — PANTOPRAZOLE SODIUM 40 MG PO TBEC
40.0000 mg | DELAYED_RELEASE_TABLET | Freq: Every day | ORAL | 6 refills | Status: DC
Start: 2021-10-25 — End: 2022-05-22

## 2021-10-25 NOTE — Progress Notes (Signed)
Chief Complaint: Abdominal pain  Referring Provider:  Cher Nakai, MD      ASSESSMENT AND PLAN;   #1. GERD with epi pain- neg EGD 08/2017 with neg eso bx for EoE. S/p lap chole in the past. Neg solid-phase GES 08/2016, neg CT abdo/pel 05/2018, neg CTA 03/12/2019.  Likely functional dyspepsia/abdominal wall pain.   #2.  IBS-C, neg colon 07/04/2017 except for small posterior anal fissure.  Plan: - EGD with dil - Continue omeprazole 40 mg p.o. BID (may have to switch her to Protonix depending upon EGD results) - Continue Bentyl 74m po qid  prn. #120, 6 refills.  She understands that it may cause worsening constipation. - Add Levsin 0.123mS/L Q6hrs prn for severe pain. #60. - Check CBC, CMP and lipase at the time of EGD. - Continue Zofran prn as prescribed by Dr. LeTruman Hayward HPI:    Kelly HIGLEYs a [age].o. female  With H/O goiter For follow-up visit  Nausea even when she smells something. Having N/V, somewhat better with zofran. Postprandial epi pain. Having dysphagia now with both solids and liquids. No heartburn. No particular food intolerence. No NSAIDs.  No constipation. Has been using 2 Gummies per day.  Denies having any significant constipation.  Having BMs 1/every other day.  No melena or hematochezia.  Negative CTA Abdo/pelvis 03/12/2019  Would like to have medication refill-Bentyl.  Wt Readings from Last 3 Encounters:  10/25/21 237 lb (107.5 kg)  10/18/21 237 lb 4 oz (107.6 kg)  10/15/21 241 lb (109.3 kg)   Stress eater.  Has gained weight   Wt Readings from Last 3 Encounters:  10/25/21 237 lb (107.5 kg)  10/18/21 237 lb 4 oz (107.6 kg)  10/15/21 241 lb (109.3 kg)     Past Medical History:  Diagnosis Date   Anxiety disorder    Atypical squamous cell changes of undetermined significance (ASCUS) on vaginal cytology with positive high risk human papilloma virus (HPV) 09/11/2020   Formatting of this note might be different from the original. Many years ago  before hyst cryo for abnormal pap 2021 ASCUS pap with pos HR HPV. Colpo-possible lesion- appears excoriated at the R cuff. Brushing shows inflammation with slightly positive  Ki67 index.10 days of metrogel. Recheck 3 weeks. Repeat pap in 6 mos. 03/2021 Nl pap with pos HR HPV   Bursitis, trochanteric 04/15/2013   Cardiac murmur 04/19/2021   Common migraine with intractable migraine 06/05/2021   Contracture of tendon sheath 11/26/2018   Depression with anxiety    Drug therapy 02/02/2018   Epistaxis 03/13/2016   Essential hypertension 04/19/2021   Fibromyalgia    G-6-PD deficiency 03/13/2016   GERD (gastroesophageal reflux disease)    Hyperemesis gravidarum 03/13/2016   Hyperlipidemia    Hypertension    Insomnia    Kidney stone    Laryngeal trauma 07/23/2012   Long term current use of opiate analgesic 03/12/2013   Migraine    Multiple joint pain 03/13/2016   Obesity (BMI 35.0-39.9 without comorbidity) 04/19/2021   Onychomycosis due to dermatophyte 11/03/2018   Osteoarthritis of left hip 03/12/2013   Ovarian cyst 03/13/2016   Pain due to onychomycosis of toenail of left foot 02/19/2018   Pain syndrome, chronic 03/12/2013   Peripheral polyneuropathy 02/02/2018   Pharyngitis 03/13/2016   Positive ANA (antinuclear antibody) 03/13/2016   Sacroiliac pain 03/12/2013   Seizure disorder ([facility]27[place]2018   Syncope and collapse 01/01/2016   Thoracic spondylosis 03/12/2013   Thyroid disease  Vaginal bleeding 05/20/2019   Formatting of this note might be different from the original.  Prior hyst ~2005 for benign indications Bleeding 05/2019 Postcoital bleeding reported 08/2020   Voice hoarseness 02/02/2018    Past Surgical History:  Procedure Laterality Date   ABDOMINAL HYSTERECTOMY     APPENDECTOMY     CHOLECYSTECTOMY     ESOPHAGOGASTRODUODENOSCOPY  08/29/2017   Mild gastritis. Stauts post empiric esophageal dilitation.    TOE SURGERY Left    removed toe nail   TONSILLECTOMY AND  ADENOIDECTOMY      Family History  Problem Relation Age of Onset   Cancer Mother    Diabetes Mother    Hypertension Mother    Kidney cancer Mother    Heart attack Father        59   Seizures Father    Thyroid cancer Sister        Patient reported   Thyroid cancer Maternal Aunt    Hypertension Maternal Grandmother    Lung cancer Maternal Grandmother    Stroke Maternal Grandmother    Heart attack Maternal Grandmother    Hypertension Maternal Grandfather    Colon cancer Maternal Grandfather    Pancreatic cancer Maternal Grandfather    Prostate cancer Maternal Grandfather    Lung cancer Maternal Grandfather    Hypertension Paternal Grandmother    Hypertension Paternal Grandfather    Esophageal cancer Neg Hx    Stomach cancer Neg Hx    Rectal cancer Neg Hx     Social History   Tobacco Use   Smoking status: Never   Smokeless tobacco: Never  Vaping Use   Vaping Use: Never used  Substance Use Topics   Alcohol use: No   Drug use: No    Current Outpatient Medications  Medication Sig Dispense Refill   amLODipine (NORVASC) 5 MG tablet Take 5 mg by mouth daily.      buPROPion (WELLBUTRIN XL) 300 MG 24 hr tablet Take 300 mg by mouth 2 (two) times daily.     clonazePAM (KLONOPIN) 0.5 MG tablet Take 0.5 mg by mouth 2 (two) times daily as needed for anxiety.     cyclobenzaprine (FLEXERIL) 10 MG tablet Take 1 tablet (10 mg total) by mouth 3 (three) times daily as needed for Muscle spasms.  3   dicyclomine (BENTYL) 10 MG capsule Take 1 capsule (10 mg total) by mouth 4 (four) times daily as needed for spasms. 120 capsule 6   escitalopram (LEXAPRO) 20 MG tablet Take 20 mg by mouth daily.     furosemide (LASIX) 20 MG tablet Take 20 mg by mouth daily.     hyoscyamine (LEVSIN SL) 0.125 MG SL tablet Place 1 tablet (0.125 mg total) under the tongue every 6 (six) hours as needed. 60 tablet 1   lamoTRIgine (LAMICTAL) 100 MG tablet Take 2 tablets (200 mg total) by mouth 2 (two) times daily.  360 tablet 6   LYRICA 50 MG capsule Take 1 capsule by mouth 2 (two) times daily.   5   omeprazole (PRILOSEC) 40 MG capsule Take 1 capsule (40 mg total) by mouth in the morning and at bedtime. 60 capsule 11   ondansetron (ZOFRAN) 4 MG tablet Take 1 tablet (4 mg total) by mouth every 8 (eight) hours as needed for nausea or vomiting. 20 tablet 3   rizatriptan (MAXALT) 10 MG tablet Take 1 tablet (10 mg total) by mouth 3 (three) times daily as needed for migraine. 10 tablet 3   zolpidem (  AMBIEN) 10 MG tablet Take 10 mg by mouth at bedtime as needed for sleep.  0   EPINEPHrine 0.3 mg/0.3 mL IJ SOAJ injection Inject 0.3 mg into the muscle as needed for anaphylaxis.     nitroGLYCERIN (NITROSTAT) 0.4 MG SL tablet Place 0.4 mg under the tongue every 5 (five) minutes as needed for chest pain.     predniSONE (DELTASONE) 5 MG tablet Take 5 mg by mouth as needed for edema (swelling).     XOLAIR 150 MG/ML prefilled syringe Inject 150 mg into the skin every 30 (thirty) days. 2 injections     Current Facility-Administered Medications  Medication Dose Route Frequency Provider Last Rate Last Admin   0.9 %  sodium chloride infusion  500 mL Intravenous Once Jackquline Denmark, MD        Allergies  Allergen Reactions   Bee Venom Anaphylaxis   Ibuprofen Hives, Nausea And Vomiting and Rash    Other reaction(s): Vomiting (intolerance)   Acetaminophen    Aminolevulinate Derivatives    Aspirin-Acetaminophen-Caffeine    Dapsone     Other reaction(s): Other (See Comments)   Dimercaprol     Other reaction(s): Other (See Comments)   Methylcellulose    Nitrofuran Derivatives    Phenazopyridine    Primaquine    Toluidine Blue    Celebrex [Celecoxib] Hives and Rash   Latex Rash   Oxycodone Rash   Phenergan [Promethazine Hcl] Rash    Review of Systems:  Reviewed     Physical Exam:    BP 113/75   Pulse 84   Temp 98.4 F (36.9 C)   Ht _0  (1.651 m)   Wt 237 lb (107.5 kg)   SpO2 97%   BMI 39.44 kg/m     Gen: awake, alert, NAD HEENT: anicteric, no pallor CV: RRR, no mrg Pulm: CTA b/l Abd: soft, NT/ND, +BS throughout Ext: no c/c/e Neuro: nonfocal   Data Reviewed: I have personally reviewed following labs and imaging studies  CBC: CBC Latest Ref Rng & Units 12/07/2020 08/03/2020 06/20/2020  WBC 3.4 - 10.8 x10E3/uL 7.0 11.6(H) 7.4  Hemoglobin 11.1 - 15.9 g/dL 13.9 12.7 13.0  Hematocrit 34.0 - 46.6 % 41.6 39.3 38.7  Platelets 150 - 450 x10E3/uL 363 365 356    CMP: CMP Latest Ref Rng & Units 12/07/2020 08/03/2020 06/20/2020  Glucose 65 - 99 mg/dL 91 100(H) 97  BUN 6 - 24 mg/dL _1 Creatinine 0.57 - 1.00 mg/dL 1.02(H) 1.03(H) 1.10(H)  Sodium 134 - 144 mmol/L 143 140 141  Potassium 3.5 - 5.2 mmol/L 4.2 3.8 4.4  Chloride 96 - 106 mmol/L 106 106 104  CO2 20 - 29 mmol/L _2 Calcium 8.7 - 10.2 mg/dL 9.6 9.4 9.4  Total Protein 6.0 - 8.5 g/dL 6.8 7.1 6.5  Total Bilirubin 0.0 - 1.2 mg/dL 0.3 0.5 0.2  Alkaline Phos 44 - 121 IU/L 86 67 96  AST 0 - 40 IU/L _3 ALT 0 - 32 IU/L _4 Carmell Austria, MD 10/25/2021, 9:58 AM  Cc: Cher Nakai, MD

## 2021-10-25 NOTE — Progress Notes (Signed)
Sedate, gd SR, tolerated procedure well, VSS, report to RN 

## 2021-10-25 NOTE — Progress Notes (Signed)
Pt had nausea after waking up. She had heaving and spit up phlegm. Orders received for IV Zofran. Pt stable and nausea relieved at time of discharge.

## 2021-10-25 NOTE — Patient Instructions (Addendum)
Handout provided on gastritis and post-dilation diet.   Switch omeprazole to Protonix 40mg  by mouth once daily. Prescription sent to your pharmacy.   Avoid aspirin, ibuprofen, naproxen, or other non-steriodal anti-inflammatory drugs (NSAIDs) for. You may use Tylenol if needed for mild pain or fever.    YOU HAD AN ENDOSCOPIC PROCEDURE TODAY AT [facility]:   Refer to the procedure report that was given to you for any specific questions about what was found during the examination.  If the procedure report does not answer your questions, please call your gastroenterologist to clarify.  If you requested that your care partner not be given the details of your procedure findings, then the procedure report has been included in a sealed envelope for you to review at your convenience later.  YOU SHOULD EXPECT: Some feelings of bloating in the abdomen. Passage of more gas than usual.  Walking can help get rid of the air that was put into your GI tract during the procedure and reduce the bloating. If you had a lower endoscopy (such as a colonoscopy or flexible sigmoidoscopy) you may notice spotting of blood in your stool or on the toilet paper. If you underwent a bowel prep for your procedure, you may not have a normal bowel movement for a few days.  Please Note:  You might notice some irritation and congestion in your nose or some drainage.  This is from the oxygen used during your procedure.  There is no need for concern and it should clear up in a day or so.  SYMPTOMS TO REPORT IMMEDIATELY:  Following upper endoscopy (EGD)  Vomiting of blood or coffee ground material  New chest pain or pain under the shoulder blades  Painful or persistently difficult swallowing  New shortness of breath  Fever of 100F or higher  Black, tarry-looking stools  For urgent or emergent issues, a gastroenterologist can be reached at any hour by calling [phone]. Do not use MyChart messaging for urgent  concerns.    DIET:  Post-dilation diet: Clear liquids for 2 hours (until 12:20pm). Then, a Soft diet (see handout) for the rest of today. You may resume your regular diet tomorrow. Drink plenty of fluids but you should avoid alcoholic beverages for 24 hours.  ACTIVITY:  You should plan to take it easy for the rest of today and you should NOT DRIVE or use heavy machinery until tomorrow (because of the sedation medicines used during the test).    FOLLOW UP: Our staff will call the number listed on your records 48-72 hours following your procedure to check on you and address any questions or concerns that you may have regarding the information given to you following your procedure. If we do not reach you, we will leave a message.  We will attempt to reach you two times.  During this call, we will ask if you have developed any symptoms of COVID 19. If you develop any symptoms (ie: fever, flu-like symptoms, shortness of breath, cough etc.) before then, please call [phone].  If you test positive for Covid 19 in the 2 weeks post procedure, please call and report this information to [place].    If any biopsies were taken you will be contacted by phone or by letter within the next 1-3 weeks.  Please call us at [phone] if you have not heard about the biopsies in 3 weeks.    SIGNATURES/CONFIDENTIALITY: You and/or your care partner have signed paperwork which will be entered  into your electronic medical record.  These signatures attest to the fact that that the information above on your After Visit Summary has been reviewed and is understood.  Full responsibility of the confidentiality of this discharge information lies with you and/or your care-partner.

## 2021-10-25 NOTE — Op Note (Signed)
[facility] Patient Name: Kelly Haas Procedure Date: 10/25/2021 10:00 AM MRN: [id number] Endoscopist: Jackquline Denmark , MD Age: 50 Referring MD:  Date of Birth: Nov 24, 1970 Gender: Female Account #: [id number] Procedure:                Upper GI endoscopy Indications:              Dysphagia. H/O N/V Medicines:                Monitored Anesthesia Care Procedure:                Pre-Anesthesia Assessment:                           - Prior to the procedure, a History and Physical                            was performed, and patient medications and                            allergies were reviewed. The patient's tolerance of                            previous anesthesia was also reviewed. The risks                            and benefits of the procedure and the sedation                            options and risks were discussed with the patient.                            All questions were answered, and informed consent                            was obtained. Prior Anticoagulants: The patient has                            taken no previous anticoagulant or antiplatelet                            agents. ASA Grade Assessment: II - A patient with                            mild systemic disease. After reviewing the risks                            and benefits, the patient was deemed in                            satisfactory condition to undergo the procedure.                           After obtaining informed consent, the endoscope was  passed under direct vision. Throughout the                            procedure, the patient's blood pressure, pulse, and                            oxygen saturations were monitored continuously. The                            Endoscope was introduced through the mouth, and                            advanced to the second part of duodenum. The upper                            GI endoscopy was accomplished without  difficulty.                            The patient tolerated the procedure well. Scope In: Scope Out: Findings:                 The distal esophagus was mildly tortuous. Biopsies                            were obtained from the proximal and distal                            esophagus with cold forceps for histology to r/o                            eosinophilic esophagitis. The scope was withdrawn.                            Dilation was performed with a Maloney dilator with                            mild resistance at 50 Fr and 52 Fr.                           The Z-line was regular and was found 36 cm from the                            incisors, examined by NBI.                           Localized minimal inflammation characterized by                            erosions was found in the gastric antrum. Biopsies                            were taken with a cold forceps for histology.  The examined duodenum was normal. Biopsies for                            histology were taken with a cold forceps for                            evaluation of celiac disease. Complications:            No immediate complications. Estimated Blood Loss:     Estimated blood loss: none. Impression:               - Mild Gastritis.                           - S/P empiric esophageal dilatation. Recommendation:           - Patient has a contact number available for                            emergencies. The signs and symptoms of potential                            delayed complications were discussed with the                            patient. Return to normal activities tomorrow.                            Written discharge instructions were provided to the                            patient.                           - Post dilatation diet.                           - Switch omeprazole to Protonix 40 mg p.o. once a                            day #30, 6 refills./                            - Await pathology results.                           - Avoid ibuprofen, naproxen, or other non-steroidal                            anti-inflammatory drugs.                           - The findings and recommendations were discussed                            with the patient's family. Jackquline Denmark, MD 10/25/2021 10:23:39 AM This report has been signed electronically.

## 2021-10-25 NOTE — Progress Notes (Signed)
Called to room to assist during endoscopic procedure.  Patient ID and intended procedure confirmed with present staff. Received instructions for my participation in the procedure from the performing physician.  

## 2021-10-25 NOTE — Progress Notes (Signed)
VS taken by C.W. 

## 2021-10-29 ENCOUNTER — Telehealth: Payer: Self-pay

## 2021-10-29 NOTE — Telephone Encounter (Signed)
  Follow up Call-  Call back number 10/25/2021  Post procedure Call Back phone  # [phone]  Permission to leave phone message Yes  Some recent data might be hidden     Patient questions:  Do you have a fever, pain , or abdominal swelling? No. Pain Score  0 *  Have you tolerated food without any problems? Yes.    Have you been able to return to your normal activities? Yes.    Do you have any questions about your discharge instructions: Diet   No. Medications  No. Follow up visit  No.  Do you have questions or concerns about your Care? No.  Actions: * If pain score is 4 or above: No action needed, pain <4.  Have you developed a fever since your procedure? no  2.   Have you had an respiratory symptoms (SOB or cough) since your procedure? no  3.   Have you tested positive for COVID 19 since your procedure no  4.   Have you had any family members/close contacts diagnosed with the COVID 19 since your procedure?  no   If yes to any of these questions please route to Joylene John, RN and Joella Prince, RN

## 2021-11-01 ENCOUNTER — Other Ambulatory Visit: Payer: Self-pay

## 2021-11-01 ENCOUNTER — Encounter (HOSPITAL_COMMUNITY): Payer: Self-pay

## 2021-11-01 ENCOUNTER — Emergency Department (HOSPITAL_COMMUNITY)
Admission: EM | Admit: 2021-11-01 | Discharge: 2021-11-01 | Disposition: A | Discharge status: Home / Self Care | Payer: Medicare Other | Attending: Emergency Medicine | Admitting: Emergency Medicine

## 2021-11-01 DIAGNOSIS — R42 Dizziness and giddiness: Secondary | ICD-10-CM | POA: Diagnosis not present

## 2021-11-01 DIAGNOSIS — Z79899 Other long term (current) drug therapy: Secondary | ICD-10-CM | POA: Diagnosis not present

## 2021-11-01 DIAGNOSIS — R55 Syncope and collapse: Secondary | ICD-10-CM | POA: Diagnosis not present

## 2021-11-01 DIAGNOSIS — Z91040 Latex allergy status: Secondary | ICD-10-CM | POA: Diagnosis not present

## 2021-11-01 DIAGNOSIS — R000 Tachycardia, unspecified: Secondary | ICD-10-CM | POA: Insufficient documentation

## 2021-11-01 DIAGNOSIS — G4489 Other headache syndrome: Secondary | ICD-10-CM | POA: Diagnosis not present

## 2021-11-01 DIAGNOSIS — I10 Essential (primary) hypertension: Secondary | ICD-10-CM | POA: Insufficient documentation

## 2021-11-01 LAB — BASIC METABOLIC PANEL
Anion gap: 7 (ref 5–15)
BUN: 8 mg/dL (ref 6–20)
CO2: 27 mmol/L (ref 22–32)
Calcium: 9.4 mg/dL (ref 8.9–10.3)
Chloride: 105 mmol/L (ref 98–111)
Creatinine, Ser: 1.1 mg/dL — ABNORMAL HIGH (ref 0.44–1.00)
GFR, Estimated: >60 mL/min (ref >60)
Glucose, Bld: 116 mg/dL — ABNORMAL HIGH (ref 70–99)
Potassium: 3.9 mmol/L (ref 3.5–5.1)
Sodium: 139 mmol/L (ref 135–145)

## 2021-11-01 LAB — CBC WITH DIFFERENTIAL/PLATELET
Abs Immature Granulocytes: 0.01 10*3/uL (ref 0.00–0.07)
Basophils Absolute: 0 10*3/uL (ref 0.0–0.1)
Basophils Relative: 1 %
Eosinophils Absolute: 0.1 10*3/uL (ref 0.0–0.5)
Eosinophils Relative: 2 %
HCT: 43 % (ref 36.0–46.0)
Hemoglobin: 14 g/dL (ref 12.0–15.0)
Immature Granulocytes: 0 %
Lymphocytes Relative: 32 %
Lymphs Abs: 2.2 10*3/uL (ref 0.7–4.0)
MCH: 30.1 pg (ref 26.0–34.0)
MCHC: 32.6 g/dL (ref 30.0–36.0)
MCV: 92.5 fL (ref 80.0–100.0)
Monocytes Absolute: 0.4 10*3/uL (ref 0.1–1.0)
Monocytes Relative: 6 %
Neutro Abs: 4.1 10*3/uL (ref 1.7–7.7)
Neutrophils Relative %: 59 %
Platelets: 354 10*3/uL (ref 150–400)
RBC: 4.65 MIL/uL (ref 3.87–5.11)
RDW: 12.1 % (ref 11.5–15.5)
WBC: 6.8 10*3/uL (ref 4.0–10.5)
nRBC: 0 % (ref 0.0–0.2)

## 2021-11-01 LAB — TROPONIN I (HIGH SENSITIVITY): Troponin I (High Sensitivity): <2 ng/L (ref <18)

## 2021-11-01 MED ORDER — SODIUM CHLORIDE 0.9 % IV BOLUS
1000.0000 mL | Freq: Once | INTRAVENOUS | Status: AC
  Administered 2021-11-01: 1000 mL via INTRAVENOUS

## 2021-11-01 MED ORDER — DIPHENHYDRAMINE HCL 50 MG/ML IJ SOLN
12.5000 mg | Freq: Once | INTRAMUSCULAR | Status: AC
  Administered 2021-11-01: 12.5 mg via INTRAVENOUS
  Filled 2021-11-01: qty 1

## 2021-11-01 MED ORDER — DIPHENHYDRAMINE HCL 50 MG/ML IJ SOLN
37.5000 mg | Freq: Once | INTRAMUSCULAR | Status: AC
  Administered 2021-11-01: 37.5 mg via INTRAVENOUS
  Filled 2021-11-01: qty 1

## 2021-11-01 MED ORDER — PROCHLORPERAZINE EDISYLATE 10 MG/2ML IJ SOLN
10.0000 mg | Freq: Once | INTRAMUSCULAR | Status: AC
  Administered 2021-11-01: 10 mg via INTRAVENOUS
  Filled 2021-11-01: qty 2

## 2021-11-01 NOTE — ED Provider Notes (Signed)
[facility] EMERGENCY DEPARTMENT Provider Note   CSN: [id number] Arrival date & time: 11/01/21  1517     History Chief Complaint  Patient presents with   Near Syncope    Kelly Haas is a 50 y.o. female with a past medical history of seizures, anxiety, depression and hypertension presenting today after a syncopal episode.  Patient reports that she was bringing her grandson to work and speaking with a Pharmacist, hospital and the next thing she remembers there were people around her trying to wake her up.  Did not feel dizzy or lightheaded prior to this event.  Has had syncopal episodes in the past, most recent in October.  Has a history of seizures reports is well controlled with Lamictal.  Follows with neurology.  Last seizure in September.  Does not believe this was a seizure because she did not have a prodrome nor a postictal period as usual.  Reports she has not been eating as much however stays hydrated.  No recent illness.  No reported head trauma although complaining of a mild headache currently.    Past Medical History:  Diagnosis Date   Anxiety disorder    Atypical squamous cell changes of undetermined significance (ASCUS) on vaginal cytology with positive high risk human papilloma virus (HPV) 09/11/2020   Formatting of this note might be different from the original. Many years ago before hyst cryo for abnormal pap 2021 ASCUS pap with pos HR HPV. Colpo-possible lesion- appears excoriated at the R cuff. Brushing shows inflammation with slightly positive  Ki67 index.10 days of metrogel. Recheck 3 weeks. Repeat pap in 6 mos. 03/2021 Nl pap with pos HR HPV   Bursitis, trochanteric 04/15/2013   Cardiac murmur 04/19/2021   Common migraine with intractable migraine 06/05/2021   Contracture of tendon sheath 11/26/2018   Depression with anxiety    Drug therapy 02/02/2018   Epistaxis 03/13/2016   Essential hypertension 04/19/2021   Fibromyalgia    G-6-PD deficiency 03/13/2016    GERD (gastroesophageal reflux disease)    Hyperemesis gravidarum 03/13/2016   Hyperlipidemia    Hypertension    Insomnia    Kidney stone    Laryngeal trauma 07/23/2012   Long term current use of opiate analgesic 03/12/2013   Migraine    Multiple joint pain 03/13/2016   Obesity (BMI 35.0-39.9 without comorbidity) 04/19/2021   Onychomycosis due to dermatophyte 11/03/2018   Osteoarthritis of left hip 03/12/2013   Ovarian cyst 03/13/2016   Pain due to onychomycosis of toenail of left foot 02/19/2018   Pain syndrome, chronic 03/12/2013   Peripheral polyneuropathy 02/02/2018   Pharyngitis 03/13/2016   Positive ANA (antinuclear antibody) 03/13/2016   Sacroiliac pain 03/12/2013   Seizure disorder ([place]) 11/13/2017   Syncope and collapse 01/01/2016   Thoracic spondylosis 03/12/2013   Thyroid disease    Vaginal bleeding 05/20/2019   Formatting of this note might be different from the original.  Prior hyst ~2005 for benign indications Bleeding 05/2019 Postcoital bleeding reported 08/2020   Voice hoarseness 02/02/2018    Patient Active Problem List   Diagnosis Date Noted   Anxiety disorder 06/26/2021   GERD (gastroesophageal reflux disease) 06/26/2021   Hyperlipidemia 06/26/2021   Hypertension 06/26/2021   Common migraine with intractable migraine 06/05/2021   Depression with anxiety 06/01/2021   Fibromyalgia 06/01/2021   Kidney stone 06/01/2021   Migraine 06/01/2021   Thyroid disease 06/01/2021   Essential hypertension 04/19/2021   Cardiac murmur 04/19/2021   Obesity (BMI 35.0-39.9 without comorbidity) 04/19/2021  Atypical squamous cell changes of undetermined significance (ASCUS) on vaginal cytology with positive high risk human papilloma virus (HPV) 09/11/2020   Vaginal bleeding 05/20/2019   Contracture of tendon sheath 11/26/2018   Onychomycosis due to dermatophyte 11/03/2018   Pain due to onychomycosis of toenail of left foot 02/19/2018   Drug therapy 02/02/2018   Peripheral  polyneuropathy 02/02/2018   Voice hoarseness 02/02/2018   Seizure disorder ([place]) 11/13/2017   Epistaxis 03/13/2016   G-6-PD deficiency 03/13/2016   Hyperemesis gravidarum 03/13/2016   Insomnia 03/13/2016   Multiple joint pain 03/13/2016   Ovarian cyst 03/13/2016   Pharyngitis 03/13/2016   Positive ANA (antinuclear antibody) 03/13/2016   Syncope and collapse 01/01/2016   Bursitis, trochanteric 04/15/2013   Long term current use of opiate analgesic 03/12/2013   Osteoarthritis of left hip 03/12/2013   Pain syndrome, chronic 03/12/2013   Sacroiliac pain 03/12/2013   Thoracic spondylosis 03/12/2013   Laryngeal trauma 07/23/2012    Past Surgical History:  Procedure Laterality Date   ABDOMINAL HYSTERECTOMY     APPENDECTOMY     CHOLECYSTECTOMY     ESOPHAGOGASTRODUODENOSCOPY  08/29/2017   Mild gastritis. Stauts post empiric esophageal dilitation.    TOE SURGERY Left    removed toe nail   TONSILLECTOMY AND ADENOIDECTOMY       OB History   No obstetric history on file.     Family History  Problem Relation Age of Onset   Cancer Mother    Diabetes Mother    Hypertension Mother    Kidney cancer Mother    Heart attack Father        2   Seizures Father    Thyroid cancer Sister        Patient reported   Thyroid cancer Maternal Aunt    Hypertension Maternal Grandmother    Lung cancer Maternal Grandmother    Stroke Maternal Grandmother    Heart attack Maternal Grandmother    Hypertension Maternal Grandfather    Colon cancer Maternal Grandfather    Pancreatic cancer Maternal Grandfather    Prostate cancer Maternal Grandfather    Lung cancer Maternal Grandfather    Hypertension Paternal Grandmother    Hypertension Paternal Grandfather    Esophageal cancer Neg Hx    Stomach cancer Neg Hx    Rectal cancer Neg Hx     Social History   Tobacco Use   Smoking status: Never   Smokeless tobacco: Never  Vaping Use   Vaping Use: Never used  Substance Use Topics   Alcohol  use: No   Drug use: No    Home Medications Prior to Admission medications   Medication Sig Start Date End Date Taking? Authorizing Provider  amLODipine (NORVASC) 5 MG tablet Take 5 mg by mouth daily.     [provider]  buPROPion (WELLBUTRIN XL) 300 MG 24 hr tablet Take 300 mg by mouth 2 (two) times daily.    [provider]  clonazePAM (KLONOPIN) 0.5 MG tablet Take 0.5 mg by mouth 2 (two) times daily as needed for anxiety.    [provider]  cyclobenzaprine (FLEXERIL) 10 MG tablet Take 1 tablet (10 mg total) by mouth 3 (three) times daily as needed for Muscle spasms. 10/20/17   [provider]  dicyclomine (BENTYL) 10 MG capsule Take 1 capsule (10 mg total) by mouth 4 (four) times daily as needed for spasms. 10/18/21   Jackquline Denmark, MD  EPINEPHrine 0.3 mg/0.3 mL IJ SOAJ injection Inject 0.3 mg into the  muscle as needed for anaphylaxis.    [provider]  escitalopram (LEXAPRO) 20 MG tablet Take 20 mg by mouth daily. 11/02/20   [provider]  furosemide (LASIX) 20 MG tablet Take 20 mg by mouth daily. 01/09/21   [provider]  hyoscyamine (LEVSIN SL) 0.125 MG SL tablet Place 1 tablet (0.125 mg total) under the tongue every 6 (six) hours as needed. 10/18/21   Jackquline Denmark, MD  lamoTRIgine (LAMICTAL) 100 MG tablet Take 2 tablets (200 mg total) by mouth 2 (two) times daily. 06/05/21   Kathrynn Ducking, MD  LYRICA 50 MG capsule Take 1 capsule by mouth 2 (two) times daily.  01/21/18   [provider]  nitroGLYCERIN (NITROSTAT) 0.4 MG SL tablet Place 0.4 mg under the tongue every 5 (five) minutes as needed for chest pain.    [provider]  ondansetron (ZOFRAN) 4 MG tablet Take 1 tablet (4 mg total) by mouth every 8 (eight) hours as needed for nausea or vomiting. 08/07/20   Suzzanne Cloud, NP  pantoprazole (PROTONIX) 40 MG tablet Take 1 tablet (40 mg total) by mouth daily. 10/25/21   Jackquline Denmark, MD  predniSONE  (DELTASONE) 5 MG tablet Take 5 mg by mouth as needed for edema (swelling).    [provider]  rizatriptan (MAXALT) 10 MG tablet Take 1 tablet (10 mg total) by mouth 3 (three) times daily as needed for migraine. 06/05/21   Kathrynn Ducking, MD  Arvid Right 150 MG/ML prefilled syringe Inject 150 mg into the skin every 30 (thirty) days. 2 injections 08/02/19   [provider]  zolpidem (AMBIEN) 10 MG tablet Take 10 mg by mouth at bedtime as needed for sleep. 10/20/17   [provider]    Allergies    Bee venom, Ibuprofen, Acetaminophen, Aminolevulinate derivatives, Aspirin-acetaminophen-caffeine, Dapsone, Dimercaprol, Methylcellulose, Nitrofuran derivatives, Phenazopyridine, Primaquine, Toluidine blue, Celebrex [celecoxib], Latex, Oxycodone, and Phenergan [promethazine hcl]  Review of Systems   Review of Systems  Constitutional:  Negative for fatigue.  Respiratory:  Negative for shortness of breath.   Cardiovascular:  Negative for chest pain and palpitations.  Gastrointestinal:  Negative for nausea.  Neurological:  Positive for syncope and headaches. Negative for dizziness.  All other systems reviewed and are negative.  Physical Exam Updated Vital Signs BP (!) 121/91 (BP Location: Left Arm)    Pulse 93    Temp 98.5 F (36.9 C) (Oral)    Resp 16    Ht _0  (1.651 m)    Wt 108.9 kg    SpO2 96%    BMI 39.94 kg/m   Physical Exam Vitals and nursing note reviewed.  Constitutional:      General: She is not in acute distress.    Appearance: Normal appearance. She is not ill-appearing.  HENT:     Head: Normocephalic and atraumatic.  Eyes:     General: No scleral icterus.    Conjunctiva/sclera: Conjunctivae normal.     Pupils: Pupils are equal, round, and reactive to light.  Cardiovascular:     Rate and Rhythm: Regular rhythm. Tachycardia present.  Pulmonary:     Effort: Pulmonary effort is normal. No respiratory distress.     Breath sounds: No wheezing or rales.   Skin:    Findings: No rash.  Neurological:     General: No focal deficit present.     Mental Status: She is alert.     Motor: No weakness.     Coordination: Coordination normal.  Comments: 5/5 in bilat upper and lower extremities.  Ambulatory.  Cerebellar tests negative.  Psychiatric:        Mood and Affect: Mood normal.        Behavior: Behavior normal.    ED Results / Procedures / Treatments   Labs (all labs ordered are listed, but only abnormal results are displayed) Labs Reviewed  BASIC METABOLIC PANEL - Abnormal; Notable for the following components:      Result Value   Glucose, Bld 116 (*)    Creatinine, Ser 1.10 (*)    All other components within normal limits  CBC WITH DIFFERENTIAL/PLATELET    EKG None  Radiology No results found.  Procedures Procedures   Medications Ordered in ED Medications  sodium chloride 0.9 % bolus 1,000 mL (has no administration in time range)  diphenhydrAMINE (BENADRYL) injection 12.5 mg (has no administration in time range)  prochlorperazine (COMPAZINE) injection 10 mg (has no administration in time range)    ED Course  I have reviewed the triage vital signs and the nursing notes.  Pertinent labs & imaging results that were available during my care of the patient were reviewed by me and considered in my medical decision making (see chart for details).    MDM Rules/Calculators/A&P Patient fully evaluated by me in the hallway.  No acute distress.  Complaining of a mild headache however she is allergic to NSAIDs.  Given Benadryl and Compazine via IV as well as fluids.  She reports that she has "had a positive tilt test in the past."  I do not believe the patient had a syncopal episode secondary to arrhythmia.  Also low suspicion for seizure at this time.  Oxygen saturations and work of breathing normal, low suspicion for pulmonary embolus.  Neurologic exam negative and headache mild, low suspicion for intracranial hemorrhage.  No  imaging necessary.  Her syncope may be secondary to decreased oral intake or overexertion.  Fluids and medications are running at this time.  Nursing will check orthostatic vital signs afterwards, and if this is okay she is safe for discharge home with return precautions.  Signed out to [employer] at this time.  Final Clinical Impression(s) / ED Diagnoses Final diagnoses:  Syncope, unspecified syncope type    Rx / DC Orders Signed out to oncoming team.    Rhae Hammock, PA-C 11/01/21 1536    Fredia Sorrow, MD 11/07/21 1310

## 2021-11-01 NOTE — ED Provider Notes (Signed)
Care assumed from [facility], [place]. See her note for full H&P.   Per her note, "Kelly Haas is a 50 y.o. female with a past medical history of seizures, anxiety, depression and hypertension presenting today after a syncopal episode.  Patient reports that she was bringing her grandson to work and speaking with a Pharmacist, hospital and the next thing she remembers there were people around her trying to wake her up.  Did not feel dizzy or lightheaded prior to this event.  Has had syncopal episodes in the past, most recent in October.  Has a history of seizures reports is well controlled with Lamictal.  Follows with neurology.  Last seizure in September.  Does not believe this was a seizure because she did not have a prodrome nor a postictal period as usual.  Reports she has not been eating as much however stays hydrated.  No recent illness.  No reported head trauma although complaining of a mild headache currently."   Physical Exam  BP 116/75 (BP Location: Left Arm)    Pulse 84    Temp 97.7 F (36.5 C) (Oral)    Resp 15    Ht 5\' 5"  (1.651 m)    Wt 108.9 kg    SpO2 100%    BMI 39.94 kg/m   Physical Exam Vitals and nursing note reviewed.  Constitutional:      General: She is not in acute distress.    Appearance: She is well-developed.  HENT:     Head: Normocephalic and atraumatic.     Mouth/Throat:     Mouth: Mucous membranes are dry.  Eyes:     Extraocular Movements: Extraocular movements intact.     Conjunctiva/sclera: Conjunctivae normal.     Pupils: Pupils are equal, round, and reactive to light.  Cardiovascular:     Rate and Rhythm: Normal rate.  Pulmonary:     Effort: Pulmonary effort is normal.  Musculoskeletal:        General: Normal range of motion.     Cervical back: Neck supple.  Skin:    General: Skin is warm and dry.  Neurological:     Mental Status: She is alert.     Comments: Mental Status:  Alert, thought content appropriate, able to give a coherent history. Speech fluent  without evidence of aphasia. Able to follow 2 step commands without difficulty.  Cranial Nerves: II-XII intact Motor:  Normal tone. 5/5 strength of BUE and BLE major muscle groups including strong and equal grip strength and dorsiflexion/plantar flexion Sensory: light touch normal in all extremities.      ED Course/Procedures     Procedures Results for orders placed or performed during the hospital encounter of 56/38/93  Basic metabolic panel  Result Value Ref Range   Sodium 139 135 - 145 mmol/L   Potassium 3.9 3.5 - 5.1 mmol/L   Chloride 105 98 - 111 mmol/L   CO2 27 22 - 32 mmol/L   Glucose, Bld 116 (H) 70 - 99 mg/dL   BUN 8 6 - 20 mg/dL   Creatinine, Ser 1.10 (H) 0.44 - 1.00 mg/dL   Calcium 9.4 8.9 - 10.3 mg/dL   GFR, Estimated >60 >60 mL/min   Anion gap 7 5 - 15  CBC with Differential  Result Value Ref Range   WBC 6.8 4.0 - 10.5 K/uL   RBC 4.65 3.87 - 5.11 MIL/uL   Hemoglobin 14.0 12.0 - 15.0 g/dL   HCT 43.0 36.0 - 46.0 %   MCV  92.5 80.0 - 100.0 fL   MCH 30.1 26.0 - 34.0 pg   MCHC 32.6 30.0 - 36.0 g/dL   RDW 12.1 11.5 - 15.5 %   Platelets 354 150 - 400 K/uL   nRBC 0.0 0.0 - 0.2 %   Neutrophils Relative % 59 %   Neutro Abs 4.1 1.7 - 7.7 K/uL   Lymphocytes Relative 32 %   Lymphs Abs 2.2 0.7 - 4.0 K/uL   Monocytes Relative 6 %   Monocytes Absolute 0.4 0.1 - 1.0 K/uL   Eosinophils Relative 2 %   Eosinophils Absolute 0.1 0.0 - 0.5 K/uL   Basophils Relative 1 %   Basophils Absolute 0.0 0.0 - 0.1 K/uL   Immature Granulocytes 0 %   Abs Immature Granulocytes 0.01 0.00 - 0.07 K/uL  Troponin I (High Sensitivity)  Result Value Ref Range   Troponin I (High Sensitivity) <2 <18 ng/L   No results found.   MDM   50 year old female presents presents to the emergency department today for evaluation of syncopal episode.  States she was standing, conversation when the next thing she knew she woke up on the ground and had reportedly had a syncopal episode.  She notes that she  had no preceding lightheadedness dizziness, nausea, hearing or vision changes.  She did not have any seizure-like activity after this occurred.  She did not have any chest pain or shortness of breath.  She states she has had syncopal episodes in the past and has followed with her neurologist about this.  She further notes that she has had a headache since waking up this morning.  She does have a known history of migraines.  She denies any associated neurologic complaints with this.  She does report she has had decreased p.o. intake for the last few days due to decreased appetite.  Reviewed/interpreted labs which are unremarkable.  Trop - neg  EKG - w/o signs of ischemic or arrhythmia   I did recommend obtaining a CT head given the headache and syncopal episode however the patient declines at this time.  On chart review it is noted that patient has been evaluated multiple times for recurrent syncope with out any etiology identified.  She has been seen by neurology as well as cardiology and her PCP for this.  We did have a discussion about admission versus discharge home with close cardiology follow-up.  She is comfortable to plan for discharge home.  I have low suspicion of any emergent process that could have caused her syncope today given the duration of her symptoms over the course of several years.  Have advised that she return to the emergency department for any new or worsening symptoms the meantime.  She voiced understanding the plan and is in agreement, her son is at bedside and is also in agreement.  All questions answered.  Patient stable for discharge.     Bishop Dublin 11/01/21 1825    Davonna Belling, MD 11/02/21 0001

## 2021-11-01 NOTE — Discharge Instructions (Addendum)
Your work-up is negative today.  Please follow-up with your primary care provider and/or cardiologist about this episode and return to the emergency department if you experience any further loss of consciousness.  Be sure to eat regularly and stay hydrated as best you can.

## 2021-11-01 NOTE — ED Triage Notes (Signed)
Patient arrives via EMS due to having a near syncopal episode. Patient was at a daycare when she had a near syncopal episode and had to be lowered to the ground. Patient does not remember what happened, she only remembers waking up vomiting

## 2021-11-03 ENCOUNTER — Encounter: Payer: Self-pay | Admitting: Gastroenterology

## 2021-11-08 DIAGNOSIS — L501 Idiopathic urticaria: Secondary | ICD-10-CM | POA: Diagnosis not present

## 2021-11-20 DIAGNOSIS — Z0271 Encounter for disability determination: Secondary | ICD-10-CM

## 2021-12-06 DIAGNOSIS — L501 Idiopathic urticaria: Secondary | ICD-10-CM | POA: Diagnosis not present

## 2021-12-08 DIAGNOSIS — M797 Fibromyalgia: Secondary | ICD-10-CM | POA: Diagnosis not present

## 2021-12-08 DIAGNOSIS — R110 Nausea: Secondary | ICD-10-CM | POA: Diagnosis not present

## 2021-12-08 DIAGNOSIS — R519 Headache, unspecified: Secondary | ICD-10-CM | POA: Diagnosis not present

## 2021-12-08 DIAGNOSIS — M159 Polyosteoarthritis, unspecified: Secondary | ICD-10-CM | POA: Diagnosis not present

## 2021-12-08 DIAGNOSIS — R040 Epistaxis: Secondary | ICD-10-CM | POA: Diagnosis not present

## 2021-12-08 DIAGNOSIS — R55 Syncope and collapse: Secondary | ICD-10-CM | POA: Diagnosis not present

## 2021-12-08 DIAGNOSIS — G5620 Lesion of ulnar nerve, unspecified upper limb: Secondary | ICD-10-CM | POA: Diagnosis not present

## 2021-12-08 DIAGNOSIS — G4700 Insomnia, unspecified: Secondary | ICD-10-CM | POA: Diagnosis not present

## 2021-12-08 DIAGNOSIS — M79672 Pain in left foot: Secondary | ICD-10-CM | POA: Diagnosis not present

## 2021-12-08 DIAGNOSIS — G4762 Sleep related leg cramps: Secondary | ICD-10-CM | POA: Diagnosis not present

## 2021-12-08 DIAGNOSIS — E039 Hypothyroidism, unspecified: Secondary | ICD-10-CM | POA: Diagnosis not present

## 2021-12-08 DIAGNOSIS — F419 Anxiety disorder, unspecified: Secondary | ICD-10-CM | POA: Diagnosis not present

## 2021-12-12 ENCOUNTER — Other Ambulatory Visit: Payer: Self-pay

## 2021-12-12 ENCOUNTER — Telehealth: Payer: Self-pay | Admitting: Gastroenterology

## 2021-12-12 ENCOUNTER — Emergency Department (HOSPITAL_BASED_OUTPATIENT_CLINIC_OR_DEPARTMENT_OTHER)
Admission: EM | Admit: 2021-12-12 | Discharge: 2021-12-12 | Disposition: A | Discharge status: Home / Self Care | Payer: Medicare Other | Attending: Emergency Medicine | Admitting: Emergency Medicine

## 2021-12-12 ENCOUNTER — Encounter (HOSPITAL_BASED_OUTPATIENT_CLINIC_OR_DEPARTMENT_OTHER): Payer: Self-pay

## 2021-12-12 ENCOUNTER — Emergency Department (HOSPITAL_BASED_OUTPATIENT_CLINIC_OR_DEPARTMENT_OTHER): Payer: Medicare Other

## 2021-12-12 ENCOUNTER — Other Ambulatory Visit (HOSPITAL_BASED_OUTPATIENT_CLINIC_OR_DEPARTMENT_OTHER): Payer: Self-pay

## 2021-12-12 DIAGNOSIS — R059 Cough, unspecified: Secondary | ICD-10-CM | POA: Diagnosis not present

## 2021-12-12 DIAGNOSIS — R0902 Hypoxemia: Secondary | ICD-10-CM | POA: Diagnosis not present

## 2021-12-12 DIAGNOSIS — I10 Essential (primary) hypertension: Secondary | ICD-10-CM | POA: Diagnosis not present

## 2021-12-12 DIAGNOSIS — Z743 Need for continuous supervision: Secondary | ICD-10-CM | POA: Diagnosis not present

## 2021-12-12 DIAGNOSIS — S0990XA Unspecified injury of head, initial encounter: Secondary | ICD-10-CM | POA: Diagnosis not present

## 2021-12-12 DIAGNOSIS — R55 Syncope and collapse: Secondary | ICD-10-CM | POA: Insufficient documentation

## 2021-12-12 DIAGNOSIS — Z91040 Latex allergy status: Secondary | ICD-10-CM | POA: Insufficient documentation

## 2021-12-12 DIAGNOSIS — R52 Pain, unspecified: Secondary | ICD-10-CM | POA: Diagnosis not present

## 2021-12-12 DIAGNOSIS — Z20822 Contact with and (suspected) exposure to covid-19: Secondary | ICD-10-CM | POA: Insufficient documentation

## 2021-12-12 DIAGNOSIS — Z79899 Other long term (current) drug therapy: Secondary | ICD-10-CM | POA: Insufficient documentation

## 2021-12-12 LAB — BASIC METABOLIC PANEL
Anion gap: 7 (ref 5–15)
BUN: 5 mg/dL — ABNORMAL LOW (ref 6–20)
CO2: 24 mmol/L (ref 22–32)
Calcium: 8.5 mg/dL — ABNORMAL LOW (ref 8.9–10.3)
Chloride: 109 mmol/L (ref 98–111)
Creatinine, Ser: 1 mg/dL (ref 0.44–1.00)
GFR, Estimated: >60 mL/min (ref >60)
Glucose, Bld: 105 mg/dL — ABNORMAL HIGH (ref 70–99)
Potassium: 3.7 mmol/L (ref 3.5–5.1)
Sodium: 140 mmol/L (ref 135–145)

## 2021-12-12 LAB — CBC
HCT: 37.3 % (ref 36.0–46.0)
Hemoglobin: 12.3 g/dL (ref 12.0–15.0)
MCH: 29.8 pg (ref 26.0–34.0)
MCHC: 33 g/dL (ref 30.0–36.0)
MCV: 90.3 fL (ref 80.0–100.0)
Platelets: 283 10*3/uL (ref 150–400)
RBC: 4.13 MIL/uL (ref 3.87–5.11)
RDW: 12.3 % (ref 11.5–15.5)
WBC: 6.5 10*3/uL (ref 4.0–10.5)
nRBC: 0 % (ref 0.0–0.2)

## 2021-12-12 LAB — RESP PANEL BY RT-PCR (FLU A&B, COVID) ARPGX2
Influenza A by PCR: NEGATIVE
Influenza B by PCR: NEGATIVE
SARS Coronavirus 2 by RT PCR: NEGATIVE

## 2021-12-12 LAB — CBG MONITORING, ED: Glucose-Capillary: 97 mg/dL (ref 70–99)

## 2021-12-12 IMAGING — DX DG CHEST 1V PORT
1 series · 1 of 1 positions shown · non-contrast
Comparison: [DATE]

CLINICAL DATA: Cough

EXAM:
PORTABLE CHEST 1 VIEW

[chest ap]
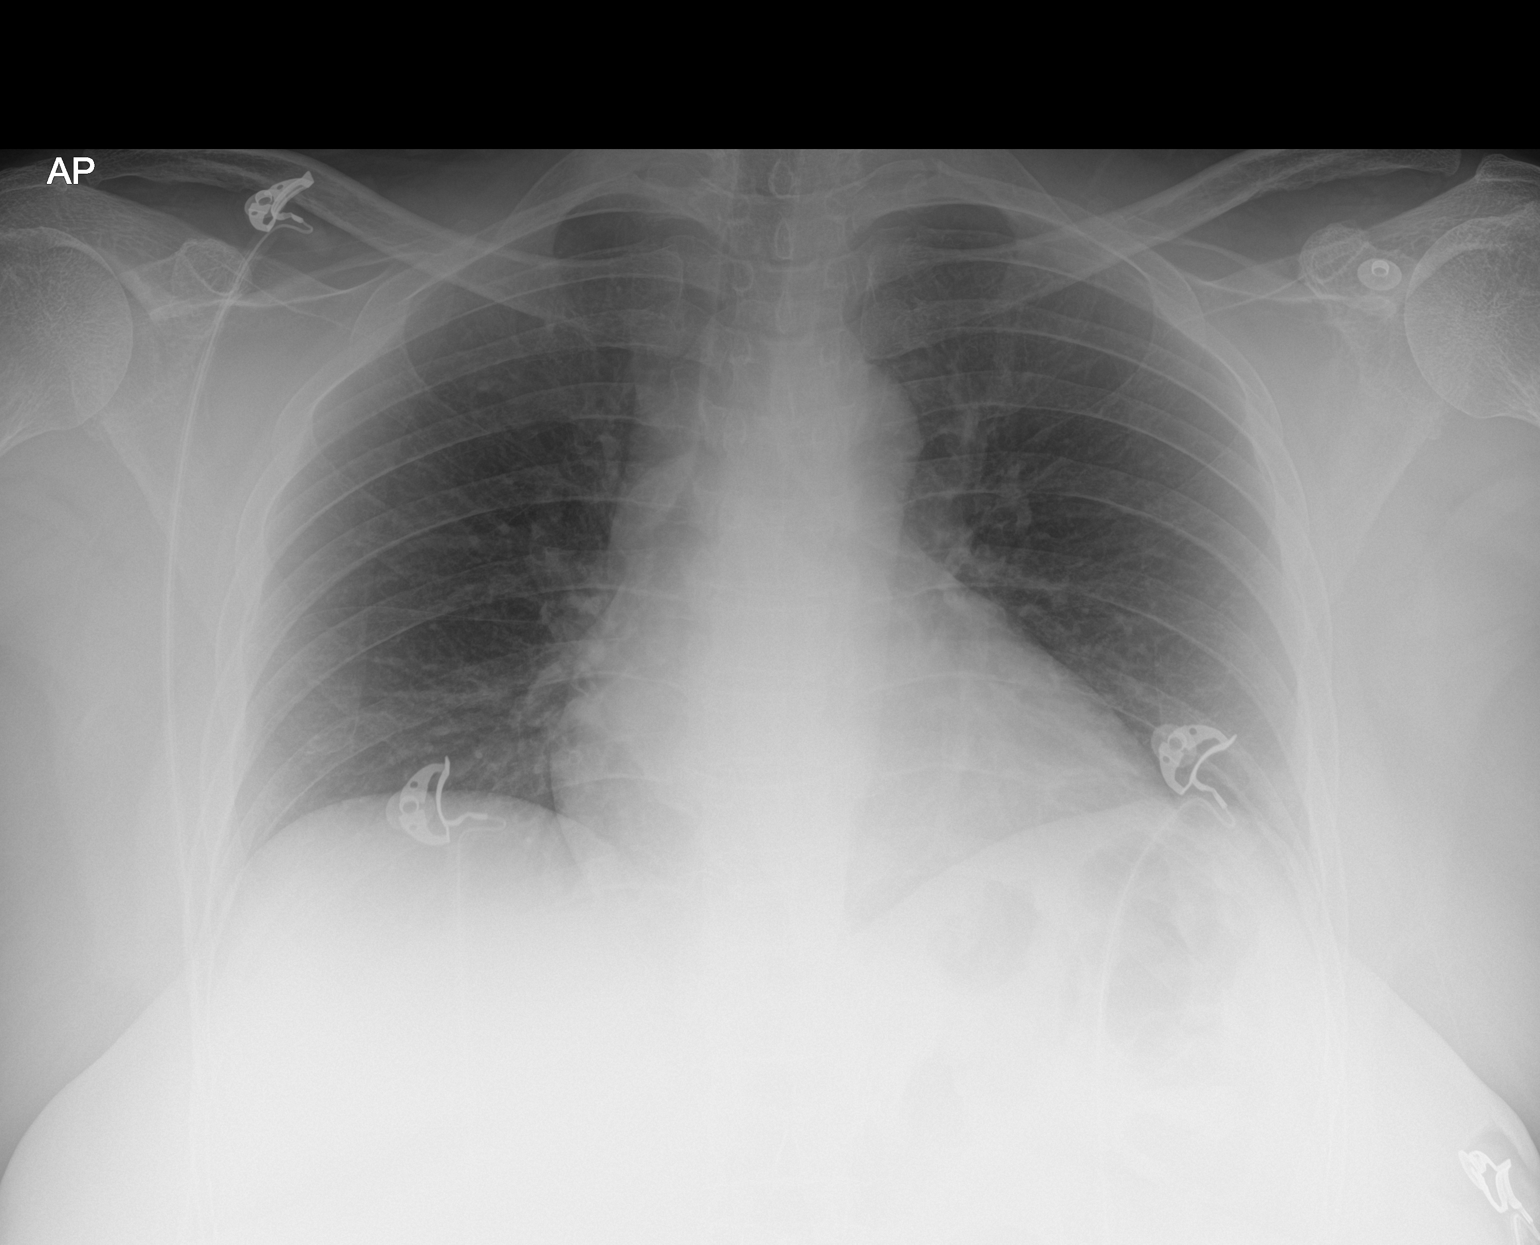

[1 of 1 positions shown; findings below may reference images not displayed]

FINDINGS: The heart size and mediastinal contours are within normal limits.
Both lungs are clear. The visualized skeletal structures are
unremarkable.
IMPRESSION: No active disease.

## 2021-12-12 MED ORDER — SODIUM CHLORIDE 0.9 % IV BOLUS
1000.0000 mL | Freq: Once | INTRAVENOUS | Status: AC
  Administered 2021-12-12: 11:00:00 1000 mL via INTRAVENOUS

## 2021-12-12 MED ORDER — ONDANSETRON 8 MG PO TBDP
8.0000 mg | ORAL_TABLET | Freq: Three times a day (TID) | ORAL | 0 refills | Status: AC | PRN
Start: 2021-12-12 — End: ?
  Filled 2021-12-12: qty 20, 7d supply, fill #0

## 2021-12-12 NOTE — ED Triage Notes (Signed)
Pt arrives via [facility] for syncopal episode at school today. Pt states she's also been sick with URI symptoms including cough, chills, N/V/D since Friday. Pt A+Ox4 on arrival   #22 L wrist with 4 mg zofran and 250 mL NS BOLUS by EMS  EMS last VS - CBG 110, 146/84, HR 86, RR 20, 98% on RA

## 2021-12-12 NOTE — Discharge Instructions (Addendum)
Please continue to drink plenty of fluids at home.  You can take the Zofran as needed for nausea and vomiting.  Follow-up with your primary symptoms continue.

## 2021-12-12 NOTE — Telephone Encounter (Signed)
Inbound call from patient requesting a call from a nurse please.  States she has been belching and wants to know if there is something otc she can take or will she need to setup an appt.  Please advise.

## 2021-12-12 NOTE — ED Provider Notes (Signed)
[facility] EMERGENCY DEPARTMENT Provider Note   CSN: [id number] Arrival date & time: 12/12/21  1006     History  Chief Complaint  Patient presents with   Loss of Consciousness   Dizziness   URI    Kelly Haas is a 51 y.o. female.   Loss of Consciousness Associated symptoms: dizziness   Dizziness Associated symptoms: syncope   URI  Patient with history of seizure disorder (controlled, lamictal), hyperlipidemia, hypertension, long-term opiate analgesic use, presents with syncope.  Reports the last 6 days she has been having diarrhea and emesis.  Denies any mucus or blood in the stool.  States that while she was at school earlier today she was getting up from the desk and felt headed and lost consciousness briefly.  Denies hitting her head or falling, denies any prodromal symptoms such as chest pain.  Patient was given Zofran by EMS which alleviated the nausea.  She is medically compliant with her seizure prophylactic medicine and denies any seizure-like activity or postictal period  No sick contacts, no new restaurants, no recent antibiotic use, no recent travel.  Home Medications Prior to Admission medications   Medication Sig Start Date End Date Taking? Authorizing Provider  ondansetron (ZOFRAN-ODT) 8 MG disintegrating tablet Take 1 tablet (8 mg total) by mouth every 8 (eight) hours as needed for nausea or vomiting. 12/12/21  Yes Sherrill Raring, PA-C  amLODipine (NORVASC) 5 MG tablet Take 5 mg by mouth daily.     [provider]  buPROPion (WELLBUTRIN XL) 300 MG 24 hr tablet Take 300 mg by mouth 2 (two) times daily.    [provider]  clonazePAM (KLONOPIN) 0.5 MG tablet Take 0.5 mg by mouth 2 (two) times daily as needed for anxiety.    [provider]  cyclobenzaprine (FLEXERIL) 10 MG tablet Take 1 tablet (10 mg total) by mouth 3 (three) times daily as needed for Muscle spasms. 10/20/17   [provider]  dicyclomine (BENTYL) 10 MG  capsule Take 1 capsule (10 mg total) by mouth 4 (four) times daily as needed for spasms. 10/18/21   Jackquline Denmark, MD  EPINEPHrine 0.3 mg/0.3 mL IJ SOAJ injection Inject 0.3 mg into the muscle as needed for anaphylaxis.    [provider]  furosemide (LASIX) 20 MG tablet Take 20 mg by mouth daily. 01/09/21   [provider]  hyoscyamine (LEVSIN SL) 0.125 MG SL tablet Place 1 tablet (0.125 mg total) under the tongue every 6 (six) hours as needed. Patient taking differently: Place 0.125 mg under the tongue every 6 (six) hours as needed for cramping. 10/18/21   Jackquline Denmark, MD  lamoTRIgine (LAMICTAL) 100 MG tablet Take 2 tablets (200 mg total) by mouth 2 (two) times daily. 06/05/21   Kathrynn Ducking, MD  LYRICA 50 MG capsule Take 1 capsule by mouth 2 (two) times daily.  01/21/18   [provider]  nitroGLYCERIN (NITROSTAT) 0.4 MG SL tablet Place 0.4 mg under the tongue every 5 (five) minutes as needed for chest pain.    [provider]  ondansetron (ZOFRAN) 4 MG tablet Take 1 tablet (4 mg total) by mouth every 8 (eight) hours as needed for nausea or vomiting. 08/07/20   Suzzanne Cloud, NP  pantoprazole (PROTONIX) 40 MG tablet Take 1 tablet (40 mg total) by mouth daily. 10/25/21   Jackquline Denmark, MD  predniSONE (DELTASONE) 5 MG tablet Take 5 mg by mouth as needed for edema (swelling). Patient not taking: Reported  on 11/01/2021    [provider]  rizatriptan (MAXALT) 10 MG tablet Take 1 tablet (10 mg total) by mouth 3 (three) times daily as needed for migraine. 06/05/21   Kathrynn Ducking, MD  Arvid Right 150 MG/ML prefilled syringe Inject 150 mg into the skin every 30 (thirty) days. 2 injections 08/02/19   [provider]  zolpidem (AMBIEN) 10 MG tablet Take 10 mg by mouth at bedtime as needed for sleep. 10/20/17   [provider]      Allergies    Bee venom, Ibuprofen, Acetaminophen, Aminolevulinate derivatives, Aspirin-acetaminophen-caffeine,  Dapsone, Dimercaprol, Methylcellulose, Nitrofuran derivatives, Phenazopyridine, Primaquine, Toluidine blue, Celebrex [celecoxib], Latex, Oxycodone, and Phenergan [promethazine hcl]    Review of Systems   Review of Systems  Cardiovascular:  Positive for syncope.  Neurological:  Positive for dizziness.   Physical Exam Updated Vital Signs BP 115/76    Pulse 75    Temp 98.6 F (37 C) (Oral)    Resp (!) 21    Ht 5\' 5"  (1.651 m)    Wt 108.9 kg    SpO2 100%    BMI 39.94 kg/m  Physical Exam Vitals and nursing note reviewed. Exam conducted with a chaperone present.  Constitutional:      Appearance: Normal appearance. She is obese.     Comments: BMI 39.94  HENT:     Head: Normocephalic and atraumatic.  Eyes:     General: No scleral icterus.       Right eye: No discharge.        Left eye: No discharge.     Extraocular Movements: Extraocular movements intact.     Pupils: Pupils are equal, round, and reactive to light.  Cardiovascular:     Rate and Rhythm: Normal rate and regular rhythm.     Pulses: Normal pulses.     Heart sounds:    No friction rub. No gallop.  Pulmonary:     Effort: Pulmonary effort is normal. No respiratory distress.     Breath sounds: Normal breath sounds.  Abdominal:     General: Abdomen is flat. Bowel sounds are normal. There is no distension.     Palpations: Abdomen is soft.     Tenderness: There is no abdominal tenderness.     Comments: Abdomen is soft, no focal tenderness  Skin:    General: Skin is warm and dry.     Coloration: Skin is not jaundiced.  Neurological:     Mental Status: She is alert. Mental status is at baseline.     Coordination: Coordination normal.     Comments: Cranial nerves III through XII are grossly intact.    ED Results / Procedures / Treatments   Labs (all labs ordered are listed, but only abnormal results are displayed) Labs Reviewed  BASIC METABOLIC PANEL - Abnormal; Notable for the following components:      Result Value    Glucose, Bld 105 (*)    BUN 5 (*)    Calcium 8.5 (*)    All other components within normal limits  RESP PANEL BY RT-PCR (FLU A&B, COVID) [id number]  CBC  CBG MONITORING, ED  CBG MONITORING, ED    EKG None  Radiology DG Chest Portable 1 View  Result Date: 12/12/2021 CLINICAL DATA:  Cough EXAM: PORTABLE CHEST 1 VIEW COMPARISON:  05/23/2019 FINDINGS: The heart size and mediastinal contours are within normal limits. Both lungs are clear. The visualized skeletal structures are unremarkable. IMPRESSION: No active disease. Electronically Signed  By: Franchot Gallo M.D.   On: 12/12/2021 10:35    Procedures Procedures    Medications Ordered in ED Medications  sodium chloride 0.9 % bolus 1,000 mL (0 mLs Intravenous Stopped 12/12/21 1210)    ED Course/ Medical Decision Making/ A&P                            This patient presents to the ED for concern of syncope and N/V/D/URI Sx, this involves an extensive number of treatment options, and is a complaint that carries with it a high risk of complications and morbidity.  The differential diagnosis includes orthostatic changes/volume depletion, arrhythmia, MI, PE, situational, gastroenteritis, seizure, hypoglycemia, viral etiology vs other   Additional history obtained: -Additional history obtained from patient's son who is at bedside. -External records from outside source obtained and reviewed including: Chart review including previous notes, labs, imaging, consultation notes   Lab Tests: -I ordered, reviewed, and interpreted labs.  The pertinent results include: COVID and flu negative.  No leukocytosis or anemia.  No gross electrolyte derangement, no AKI.  Patient is not hypoglycemic.   EKG -Normal sinus rhythm with slightly abnormal R wave progression.  No ischemic findings when compared to prior EKGs.  No bradycardia or heart block, no QT prolongation or WPW.   Imaging Studies ordered: -I ordered imaging studies including chest  x-ray -I independently visualized and interpreted imaging which showed normal cardiac silhouette without any evidence of cardiomegaly, pneumonia, widened mediastinum. -I agree with the radiologist interpretation   Medicines ordered and prescription drug management: -I ordered medication including 1L NS  for dehydration  -Reevaluation of the patient after these medicines showed that the patient improved -I have reviewed the patients home medicines and have made adjustments as needed   ED Course: Patient vitals are stable, orthostatic notable for 19 drop from standing to sitting systolic.  Her work-up does not indicate any evidence of electrolyte loss requiring replenishment here.  Presentation not consistent with a seizure, doubt ACS, PE or acute intra cardiac or pulmonary process.    Given her vitals are stable, lab work-up unrevealing, chest x-ray unrevealing, improvement with fluids and lack of recurrent symptoms or prodromal symptoms I do not feel she needs additional work-up or admission for observation at this time.  Patient is in agreement.  That she has been having a week of what sounds a gastroenteritis symptoms there is no evidence of infections that would require antibiotics.  Return precautions given, discharged in stable condition.   Cardiac Monitoring: The patient was maintained on a cardiac monitor.  I personally viewed and interpreted the cardiac monitored which showed an underlying rhythm of: NSR   Reevaluation: After the interventions noted above, I reevaluated the patient and found that they have :improved   Dispostion: Drink plenty of fluids.  Zofran for nausea, follow-up with PCP as needed.         Final Clinical Impression(s) / ED Diagnoses Final diagnoses:  Syncope and collapse    Rx / DC Orders ED Discharge Orders          Ordered    ondansetron (ZOFRAN-ODT) 8 MG disintegrating tablet  Every 8 hours PRN        12/12/21 1223               Sherrill Raring, PA-C 12/12/21 [place], Harvey, DO 12/15/21 [phone]

## 2021-12-13 NOTE — Telephone Encounter (Signed)
Pt states that she has had Diarrhea and belching for the last couple weeks. Pt states that the diarrhea is easing up although she is still having belching. Pt was just in ED yesterday for syncope episode.  Given IV fluids for dehydration. Notes stated that:  she has been having a week of what sounds a gastroenteritis symptoms there is no evidence of infections that would require antibiotics: Pt requesting something that she could take OTC to try and decrease the belching:Pt is already taking Protonix daily as prescribed by Dr. . Abbott Pao was recommended to try Gas X that she can purchase OTC that can help with the Belching. Pt verbalized understanding with all questions answered.

## 2021-12-15 DIAGNOSIS — M797 Fibromyalgia: Secondary | ICD-10-CM | POA: Diagnosis not present

## 2021-12-15 DIAGNOSIS — G4700 Insomnia, unspecified: Secondary | ICD-10-CM | POA: Diagnosis not present

## 2021-12-15 DIAGNOSIS — R519 Headache, unspecified: Secondary | ICD-10-CM | POA: Diagnosis not present

## 2021-12-15 DIAGNOSIS — E042 Nontoxic multinodular goiter: Secondary | ICD-10-CM | POA: Diagnosis not present

## 2021-12-15 DIAGNOSIS — M159 Polyosteoarthritis, unspecified: Secondary | ICD-10-CM | POA: Diagnosis not present

## 2021-12-15 DIAGNOSIS — F419 Anxiety disorder, unspecified: Secondary | ICD-10-CM | POA: Diagnosis not present

## 2021-12-15 DIAGNOSIS — R110 Nausea: Secondary | ICD-10-CM | POA: Diagnosis not present

## 2021-12-15 DIAGNOSIS — G5620 Lesion of ulnar nerve, unspecified upper limb: Secondary | ICD-10-CM | POA: Diagnosis not present

## 2021-12-15 DIAGNOSIS — R55 Syncope and collapse: Secondary | ICD-10-CM | POA: Diagnosis not present

## 2021-12-15 DIAGNOSIS — G4762 Sleep related leg cramps: Secondary | ICD-10-CM | POA: Diagnosis not present

## 2021-12-15 DIAGNOSIS — E039 Hypothyroidism, unspecified: Secondary | ICD-10-CM | POA: Diagnosis not present

## 2021-12-15 DIAGNOSIS — M79672 Pain in left foot: Secondary | ICD-10-CM | POA: Diagnosis not present

## 2022-01-01 DIAGNOSIS — L501 Idiopathic urticaria: Secondary | ICD-10-CM | POA: Diagnosis not present

## 2022-01-03 ENCOUNTER — Other Ambulatory Visit: Payer: Self-pay | Admitting: Neurology

## 2022-01-03 DIAGNOSIS — Z0271 Encounter for disability determination: Secondary | ICD-10-CM
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
## 2022-01-24 NOTE — Patient Instructions (Signed)
Below is our plan: ? ?We will continue lamotrigine '150mg'$  in am and '200mg'$  in evenings. Please verify with home medication list that you have continued topiramate '75mg'$  at bedtime.  ? ?Please make sure you are consistent with timing of seizure medication. I recommend annual visit with primary care provider (PCP) for complete physical and routine blood work. We will monitor vitamin D level. I recommend daily intake of vitamin D (400-800iu) and calcium (800-'1000mg'$ ) for bone health. Discuss Dexa screening with PCP.  ? ?According to [place] law, you can not drive unless you are seizure / syncope free for at least 6 months and under physician's care. ? ?Please maintain precautions. Do not participate in activities where a loss of awareness could harm you or someone else. No swimming alone, no tub bathing, no hot tubs, no driving, no operating motorized vehicles (cars, ATVs, motocycles, etc), lawnmowers, power tools or firearms. No standing at heights, such as rooftops, ladders or stairs. Avoid hot objects such as stoves, heaters, open fires. Wear a helmet when riding a bicycle, scooter, skateboard, etc. and avoid areas of traffic. Set your water heater to 120 degrees or less.  ? ? ?Please make sure you are staying well hydrated. I recommend 50-60 ounces daily. Well balanced diet and regular exercise encouraged. Consistent sleep schedule with 6-8 hours recommended.  ? ?Please continue follow up with care team as directed.  ? ?Follow up with Judson Roch in 1 year  ? ?You may receive a survey regarding today's visit. I encourage you to leave honest feed back as I do use this information to improve patient care. Thank you for seeing me today!  ? ? ?

## 2022-01-28 ENCOUNTER — Ambulatory Visit (INDEPENDENT_AMBULATORY_CARE_PROVIDER_SITE_OTHER): Payer: Medicare Other | Admitting: Family Medicine

## 2022-01-28 ENCOUNTER — Encounter: Payer: Self-pay | Admitting: Family Medicine

## 2022-01-28 VITALS — BP 131/78 | HR 83 | Ht 65.0 in | Wt 237.0 lb

## 2022-01-28 DIAGNOSIS — J028 Acute pharyngitis due to other specified organisms: Secondary | ICD-10-CM | POA: Diagnosis not present

## 2022-01-28 DIAGNOSIS — F3341 Major depressive disorder, recurrent, in partial remission: Secondary | ICD-10-CM | POA: Diagnosis not present

## 2022-01-28 DIAGNOSIS — G43019 Migraine without aura, intractable, without status migrainosus: Secondary | ICD-10-CM

## 2022-01-28 DIAGNOSIS — G40909 Epilepsy, unspecified, not intractable, without status epilepticus: Secondary | ICD-10-CM

## 2022-01-28 DIAGNOSIS — Z6838 Body mass index (BMI) 38.0-38.9, adult: Secondary | ICD-10-CM | POA: Diagnosis not present

## 2022-01-28 DIAGNOSIS — F419 Anxiety disorder, unspecified: Secondary | ICD-10-CM | POA: Diagnosis not present

## 2022-01-28 DIAGNOSIS — L501 Idiopathic urticaria: Secondary | ICD-10-CM | POA: Diagnosis not present

## 2022-01-28 MED ORDER — LAMOTRIGINE 100 MG PO TABS: ORAL_TABLET | ORAL | 11 refills | Status: DC

## 2022-01-29 ENCOUNTER — Encounter: Payer: Self-pay | Admitting: Family Medicine

## 2022-02-04 ENCOUNTER — Emergency Department (HOSPITAL_BASED_OUTPATIENT_CLINIC_OR_DEPARTMENT_OTHER)
Admission: EM | Admit: 2022-02-04 | Discharge: 2022-02-04 | Disposition: A | Discharge status: Home / Self Care | Payer: Medicare Other | Attending: Emergency Medicine | Admitting: Emergency Medicine

## 2022-02-04 ENCOUNTER — Encounter (HOSPITAL_BASED_OUTPATIENT_CLINIC_OR_DEPARTMENT_OTHER): Payer: Self-pay | Admitting: Emergency Medicine

## 2022-02-04 ENCOUNTER — Other Ambulatory Visit: Payer: Self-pay

## 2022-02-04 DIAGNOSIS — I10 Essential (primary) hypertension: Secondary | ICD-10-CM | POA: Diagnosis not present

## 2022-02-04 DIAGNOSIS — R21 Rash and other nonspecific skin eruption: Secondary | ICD-10-CM | POA: Insufficient documentation

## 2022-02-04 DIAGNOSIS — Z20822 Contact with and (suspected) exposure to covid-19: Secondary | ICD-10-CM | POA: Diagnosis not present

## 2022-02-04 DIAGNOSIS — Z91040 Latex allergy status: Secondary | ICD-10-CM | POA: Insufficient documentation

## 2022-02-04 DIAGNOSIS — M791 Myalgia, unspecified site: Secondary | ICD-10-CM | POA: Insufficient documentation

## 2022-02-04 DIAGNOSIS — J029 Acute pharyngitis, unspecified: Secondary | ICD-10-CM | POA: Diagnosis not present

## 2022-02-04 DIAGNOSIS — Z79899 Other long term (current) drug therapy: Secondary | ICD-10-CM | POA: Insufficient documentation

## 2022-02-04 DIAGNOSIS — R55 Syncope and collapse: Secondary | ICD-10-CM | POA: Diagnosis not present

## 2022-02-04 LAB — CBC WITH DIFFERENTIAL/PLATELET
Abs Immature Granulocytes: 0.02 10*3/uL (ref 0.00–0.07)
Basophils Absolute: 0 10*3/uL (ref 0.0–0.1)
Basophils Relative: 1 %
Eosinophils Absolute: 0.1 10*3/uL (ref 0.0–0.5)
Eosinophils Relative: 2 %
HCT: 40.3 % (ref 36.0–46.0)
Hemoglobin: 13.1 g/dL (ref 12.0–15.0)
Immature Granulocytes: 0 %
Lymphocytes Relative: 42 %
Lymphs Abs: 2.8 10*3/uL (ref 0.7–4.0)
MCH: 29.8 pg (ref 26.0–34.0)
MCHC: 32.5 g/dL (ref 30.0–36.0)
MCV: 91.6 fL (ref 80.0–100.0)
Monocytes Absolute: 0.4 10*3/uL (ref 0.1–1.0)
Monocytes Relative: 6 %
Neutro Abs: 3.4 10*3/uL (ref 1.7–7.7)
Neutrophils Relative %: 49 %
Platelets: 335 10*3/uL (ref 150–400)
RBC: 4.4 MIL/uL (ref 3.87–5.11)
RDW: 12.4 % (ref 11.5–15.5)
WBC: 6.7 10*3/uL (ref 4.0–10.5)
nRBC: 0 % (ref 0.0–0.2)

## 2022-02-04 LAB — BASIC METABOLIC PANEL
Anion gap: 7 (ref 5–15)
BUN: 8 mg/dL (ref 6–20)
CO2: 27 mmol/L (ref 22–32)
Calcium: 9.3 mg/dL (ref 8.9–10.3)
Chloride: 106 mmol/L (ref 98–111)
Creatinine, Ser: 1.05 mg/dL — ABNORMAL HIGH (ref 0.44–1.00)
GFR, Estimated: >60 mL/min (ref >60)
Glucose, Bld: 104 mg/dL — ABNORMAL HIGH (ref 70–99)
Potassium: 4.2 mmol/L (ref 3.5–5.1)
Sodium: 140 mmol/L (ref 135–145)

## 2022-02-04 LAB — GROUP A STREP BY PCR: Group A Strep by PCR: NOT DETECTED

## 2022-02-04 LAB — RESP PANEL BY RT-PCR (FLU A&B, COVID) ARPGX2
Influenza A by PCR: NEGATIVE
Influenza B by PCR: NEGATIVE
SARS Coronavirus 2 by RT PCR: NEGATIVE

## 2022-02-04 MED ORDER — DEXAMETHASONE SODIUM PHOSPHATE 10 MG/ML IJ SOLN
10.0000 mg | Freq: Once | INTRAMUSCULAR | Status: AC
  Administered 2022-02-04: 10 mg via INTRAMUSCULAR
  Filled 2022-02-04: qty 1

## 2022-02-04 NOTE — Discharge Instructions (Signed)
Given dose of steroids in the ER today to help with sore throat and body aches. ?Your labs are unrevealing, CBC and metabolic panel without significant findings.  Negative for strep, COVID, flu. ?Recommend home to rest, schedule recheck with your primary care provider. ?

## 2022-02-04 NOTE — ED Provider Notes (Signed)
?[facility] EMERGENCY DEPARTMENT ?Provider Note ? ? ?CSN: [id number] ?Arrival date & time: 02/04/22  7829 ? ?  ? ?History ? ?Chief Complaint  ?Patient presents with  ? Sore Throat  ? ? ?Kelly Haas is a 51 y.o. female. ? ?51 year old female with past medical history of thyroid disease, hypertension, migraines, fibromyalgia, seizure disorder, GERD, hyperlipidemia presents with complaint of almost 1 week of sore throat described as sandpaper feeling with generalized body aches and nausea, reportedly throwing up phlegm.  Patient has been taking her pain medications as prescribed without improvement in her symptoms.  Denies fevers or chills, sick contacts, cough or other URI symptoms.  States that she did have a red rash across her chest and upper arms at onset of symptoms which has since resolved.  Denies any known bites, falls, injuries, painful/red/swollen joints. ? ? ?  ? ?Home Medications ?Prior to Admission medications   ?Medication Sig Start Date End Date Taking? Authorizing Provider  ?amLODipine (NORVASC) 5 MG tablet Take 5 mg by mouth daily.     [provider]  ?buPROPion (WELLBUTRIN XL) 300 MG 24 hr tablet Take 300 mg by mouth 2 (two) times daily.    [provider]  ?clonazePAM (KLONOPIN) 0.5 MG tablet Take 0.5 mg by mouth 2 (two) times daily as needed for anxiety.    [provider]  ?cyclobenzaprine (FLEXERIL) 10 MG tablet Take 1 tablet (10 mg total) by mouth 3 (three) times daily as needed for Muscle spasms. 10/20/17   [provider]  ?dicyclomine (BENTYL) 10 MG capsule Take 1 capsule (10 mg total) by mouth 4 (four) times daily as needed for spasms. 10/18/21   Jackquline Denmark, MD  ?EPINEPHrine 0.3 mg/0.3 mL IJ SOAJ injection Inject 0.3 mg into the muscle as needed for anaphylaxis.    [provider]  ?furosemide (LASIX) 20 MG tablet Take 20 mg by mouth daily. 01/09/21   [provider]  ?hyoscyamine (LEVSIN SL) 0.125 MG SL tablet Place 1 tablet  (0.125 mg total) under the tongue every 6 (six) hours as needed. ?Patient taking differently: Place 0.125 mg under the tongue every 6 (six) hours as needed for cramping. 10/18/21   Jackquline Denmark, MD  ?lamoTRIgine (LAMICTAL) 100 MG tablet Take 1.5 tablets in the morning, take 2 tablets in the evening. 01/28/22   Lomax, Amy, NP  ?LYRICA 50 MG capsule Take 1 capsule by mouth 2 (two) times daily.  01/21/18   [provider]  ?nitroGLYCERIN (NITROSTAT) 0.4 MG SL tablet Place 0.4 mg under the tongue every 5 (five) minutes as needed for chest pain.    [provider]  ?ondansetron (ZOFRAN-ODT) 8 MG disintegrating tablet Take 1 tablet (8 mg total) by mouth every 8 (eight) hours as needed for nausea or vomiting. 12/12/21   Sherrill Raring, PA-C  ?pantoprazole (PROTONIX) 40 MG tablet Take 1 tablet (40 mg total) by mouth daily. 10/25/21   Jackquline Denmark, MD  ?predniSONE (DELTASONE) 5 MG tablet Take 5 mg by mouth as needed for edema (swelling).    [provider]  ?rizatriptan (MAXALT) 10 MG tablet Take 1 tablet (10 mg total) by mouth 3 (three) times daily as needed for migraine. 06/05/21   Kathrynn Ducking, MD  ?Arvid Right 150 MG/ML prefilled syringe Inject 150 mg into the skin every 30 (thirty) days. 2 injections 08/02/19   [provider]  ?zolpidem (AMBIEN) 10 MG tablet Take 10 mg by mouth at bedtime as needed for sleep. 10/20/17  [provider]  ?   ? ?Allergies    ?Bee venom, Ibuprofen, Acetaminophen, Aminolevulinate derivatives, Aspirin-acetaminophen-caffeine, Dapsone, Dimercaprol, Methylcellulose, Nitrofuran derivatives, Phenazopyridine, Primaquine, Toluidine blue, Celebrex [celecoxib], Latex, Oxycodone, and Phenergan [promethazine hcl]   ? ?Review of Systems   ?Review of Systems ?Negative except as per HPI ?Physical Exam ?Updated Vital Signs ?BP 113/64   Pulse 87   Temp 98.2 ?F (36.8 ?C) (Oral)   Resp 18   Ht '5\' 6"'$  (1.676 m)   Wt 104.8 kg   SpO2 98%   BMI 37.28 kg/m?  ?Physical  Exam ?Vitals and nursing note reviewed.  ?Constitutional:   ?   General: She is not in acute distress. ?   Appearance: She is well-developed. She is not diaphoretic.  ?HENT:  ?   Head: Normocephalic and atraumatic.  ?   Right Ear: Tympanic membrane and ear canal normal.  ?   Left Ear: Tympanic membrane and ear canal normal.  ?   Nose: No congestion.  ?   Mouth/Throat:  ?   Mouth: Mucous membranes are moist.  ?   Pharynx: Uvula midline.  ?   Tonsils: No tonsillar exudate or tonsillar abscesses.  ?Eyes:  ?   Conjunctiva/sclera: Conjunctivae normal.  ?Cardiovascular:  ?   Rate and Rhythm: Normal rate and regular rhythm.  ?   Heart sounds: Normal heart sounds.  ?Pulmonary:  ?   Effort: Pulmonary effort is normal.  ?   Breath sounds: Normal breath sounds.  ?Musculoskeletal:  ?   Cervical back: Neck supple.  ?Lymphadenopathy:  ?   Cervical: No cervical adenopathy.  ?Skin: ?   General: Skin is warm and dry.  ?   Findings: No erythema or rash.  ?Neurological:  ?   Mental Status: She is alert and oriented to person, place, and time.  ?Psychiatric:     ?   Behavior: Behavior normal.  ? ? ?ED Results / Procedures / Treatments   ?Labs ?(all labs ordered are listed, but only abnormal results are displayed) ?Labs Reviewed  ?BASIC METABOLIC PANEL - Abnormal; Notable for the following components:  ?    Result Value  ? Glucose, Bld 104 (*)   ? Creatinine, Ser 1.05 (*)   ? All other components within normal limits  ?GROUP A STREP BY PCR  ?RESP PANEL BY RT-PCR (FLU A&B, COVID) [id number]  ?CBC WITH DIFFERENTIAL/PLATELET  ? ? ?EKG ?None ? ?Radiology ?No results found. ? ?Procedures ?Procedures  ? ? ?Medications Ordered in ED ?Medications  ?dexamethasone (DECADRON) injection 10 mg (10 mg Intramuscular Given 02/04/22 1356)  ? ? ?ED Course/ Medical Decision Making/ A&P ?  ?                        ?Medical Decision Making ?Amount and/or Complexity of Data Reviewed ?Labs: ordered. ? ?Risk ?Prescription drug management. ? ? ?51 year old female  presents with complaint of scratchy throat and generalized body aches as above.  Exam is unremarkable.  She is negative for COVID/flu/strep.  Patient is given Decadron for her sore throat and body aches.  Labs reviewed including CBC and BMP without significant findings, ordered due to symptoms otherwise unexplained by her negative strep/flu/COVID.  Encouraged to follow-up with PCP for recheck and further evaluation. ? ? ? ? ? ? ? ?Final Clinical Impression(s) / ED Diagnoses ?Final diagnoses:  ?Pharyngitis, unspecified etiology  ?Myalgia  ? ? ?Rx / DC Orders ?ED Discharge Orders   ? ? None  ? ?  ? ? ?  ?  Tacy Learn, PA-C ?02/04/22 1558 ? ?  ?Gareth Morgan, MD ?02/06/22 2255 ? ?

## 2022-02-04 NOTE — ED Triage Notes (Signed)
Pt arrives pov, steady gait to triage with c/o generalized body aches and "throwing up phlegm x 1 week. Also reports sore throat. Pt endorses hoarseness  ?

## 2022-02-06 DIAGNOSIS — R519 Headache, unspecified: Secondary | ICD-10-CM | POA: Diagnosis not present

## 2022-02-06 DIAGNOSIS — G5620 Lesion of ulnar nerve, unspecified upper limb: Secondary | ICD-10-CM | POA: Diagnosis not present

## 2022-02-06 DIAGNOSIS — M79672 Pain in left foot: Secondary | ICD-10-CM | POA: Diagnosis not present

## 2022-02-06 DIAGNOSIS — G4700 Insomnia, unspecified: Secondary | ICD-10-CM | POA: Diagnosis not present

## 2022-02-06 DIAGNOSIS — Z6838 Body mass index (BMI) 38.0-38.9, adult: Secondary | ICD-10-CM | POA: Diagnosis not present

## 2022-02-06 DIAGNOSIS — R739 Hyperglycemia, unspecified: Secondary | ICD-10-CM | POA: Diagnosis not present

## 2022-02-06 DIAGNOSIS — R55 Syncope and collapse: Secondary | ICD-10-CM | POA: Diagnosis not present

## 2022-02-06 DIAGNOSIS — E039 Hypothyroidism, unspecified: Secondary | ICD-10-CM | POA: Diagnosis not present

## 2022-02-06 DIAGNOSIS — F419 Anxiety disorder, unspecified: Secondary | ICD-10-CM | POA: Diagnosis not present

## 2022-03-04 DIAGNOSIS — R251 Tremor, unspecified: Secondary | ICD-10-CM | POA: Diagnosis not present

## 2022-03-04 DIAGNOSIS — R110 Nausea: Secondary | ICD-10-CM | POA: Diagnosis not present

## 2022-03-04 DIAGNOSIS — R569 Unspecified convulsions: Secondary | ICD-10-CM | POA: Diagnosis not present

## 2022-03-04 DIAGNOSIS — G4489 Other headache syndrome: Secondary | ICD-10-CM | POA: Diagnosis not present

## 2022-03-04 DIAGNOSIS — I10 Essential (primary) hypertension: Secondary | ICD-10-CM | POA: Diagnosis not present

## 2022-03-04 DIAGNOSIS — R42 Dizziness and giddiness: Secondary | ICD-10-CM | POA: Diagnosis not present

## 2022-03-06 DIAGNOSIS — R55 Syncope and collapse: Secondary | ICD-10-CM | POA: Diagnosis not present

## 2022-03-06 DIAGNOSIS — G5620 Lesion of ulnar nerve, unspecified upper limb: Secondary | ICD-10-CM | POA: Diagnosis not present

## 2022-03-06 DIAGNOSIS — M797 Fibromyalgia: Secondary | ICD-10-CM | POA: Diagnosis not present

## 2022-03-06 DIAGNOSIS — R739 Hyperglycemia, unspecified: Secondary | ICD-10-CM | POA: Diagnosis not present

## 2022-03-06 DIAGNOSIS — I10 Essential (primary) hypertension: Secondary | ICD-10-CM | POA: Diagnosis not present

## 2022-03-06 DIAGNOSIS — G4700 Insomnia, unspecified: Secondary | ICD-10-CM | POA: Diagnosis not present

## 2022-03-06 DIAGNOSIS — E039 Hypothyroidism, unspecified: Secondary | ICD-10-CM | POA: Diagnosis not present

## 2022-03-06 DIAGNOSIS — F419 Anxiety disorder, unspecified: Secondary | ICD-10-CM | POA: Diagnosis not present

## 2022-03-06 DIAGNOSIS — M159 Polyosteoarthritis, unspecified: Secondary | ICD-10-CM | POA: Diagnosis not present

## 2022-03-06 DIAGNOSIS — G4762 Sleep related leg cramps: Secondary | ICD-10-CM | POA: Diagnosis not present

## 2022-03-06 DIAGNOSIS — M79672 Pain in left foot: Secondary | ICD-10-CM | POA: Diagnosis not present

## 2022-03-06 DIAGNOSIS — R519 Headache, unspecified: Secondary | ICD-10-CM | POA: Diagnosis not present

## 2022-03-18 DIAGNOSIS — R0789 Other chest pain: Secondary | ICD-10-CM | POA: Diagnosis not present

## 2022-03-19 DIAGNOSIS — Z79899 Other long term (current) drug therapy: Secondary | ICD-10-CM | POA: Diagnosis not present

## 2022-03-19 DIAGNOSIS — K219 Gastro-esophageal reflux disease without esophagitis: Secondary | ICD-10-CM | POA: Diagnosis not present

## 2022-03-19 DIAGNOSIS — Z556 Problems related to health literacy: Secondary | ICD-10-CM | POA: Diagnosis not present

## 2022-03-19 DIAGNOSIS — R0789 Other chest pain: Secondary | ICD-10-CM | POA: Diagnosis not present

## 2022-03-19 DIAGNOSIS — R079 Chest pain, unspecified: Secondary | ICD-10-CM | POA: Diagnosis not present

## 2022-03-19 DIAGNOSIS — I10 Essential (primary) hypertension: Secondary | ICD-10-CM | POA: Diagnosis not present

## 2022-03-21 ENCOUNTER — Other Ambulatory Visit: Payer: Self-pay | Admitting: Neurology

## 2022-03-26 ENCOUNTER — Ambulatory Visit: Payer: Medicare Other | Admitting: Family Medicine

## 2022-03-27 DIAGNOSIS — F419 Anxiety disorder, unspecified: Secondary | ICD-10-CM | POA: Diagnosis not present

## 2022-03-27 DIAGNOSIS — R739 Hyperglycemia, unspecified: Secondary | ICD-10-CM | POA: Diagnosis not present

## 2022-03-27 DIAGNOSIS — R519 Headache, unspecified: Secondary | ICD-10-CM | POA: Diagnosis not present

## 2022-03-27 DIAGNOSIS — M797 Fibromyalgia: Secondary | ICD-10-CM | POA: Diagnosis not present

## 2022-03-27 DIAGNOSIS — E039 Hypothyroidism, unspecified: Secondary | ICD-10-CM | POA: Diagnosis not present

## 2022-03-27 DIAGNOSIS — R079 Chest pain, unspecified: Secondary | ICD-10-CM | POA: Diagnosis not present

## 2022-03-27 DIAGNOSIS — I10 Essential (primary) hypertension: Secondary | ICD-10-CM | POA: Diagnosis not present

## 2022-03-27 DIAGNOSIS — M79672 Pain in left foot: Secondary | ICD-10-CM | POA: Diagnosis not present

## 2022-03-27 DIAGNOSIS — M159 Polyosteoarthritis, unspecified: Secondary | ICD-10-CM | POA: Diagnosis not present

## 2022-03-27 DIAGNOSIS — L299 Pruritus, unspecified: Secondary | ICD-10-CM | POA: Diagnosis not present

## 2022-03-27 DIAGNOSIS — G4700 Insomnia, unspecified: Secondary | ICD-10-CM | POA: Diagnosis not present

## 2022-03-27 DIAGNOSIS — G5620 Lesion of ulnar nerve, unspecified upper limb: Secondary | ICD-10-CM | POA: Diagnosis not present

## 2022-03-27 DIAGNOSIS — L501 Idiopathic urticaria: Secondary | ICD-10-CM | POA: Diagnosis not present

## 2022-03-27 DIAGNOSIS — R55 Syncope and collapse: Secondary | ICD-10-CM | POA: Diagnosis not present

## 2022-04-01 ENCOUNTER — Other Ambulatory Visit: Payer: Self-pay | Admitting: Family Medicine

## 2022-04-01 ENCOUNTER — Other Ambulatory Visit: Payer: Self-pay | Admitting: *Deleted

## 2022-04-01 ENCOUNTER — Other Ambulatory Visit (HOSPITAL_BASED_OUTPATIENT_CLINIC_OR_DEPARTMENT_OTHER): Payer: Self-pay

## 2022-04-01 MED ORDER — RIZATRIPTAN BENZOATE 10 MG PO TABS
ORAL_TABLET | ORAL | 3 refills | Status: DC
  Filled 2022-04-01: qty 10, 30d supply, fill #0
  Filled 2022-04-02: qty 10, 20d supply, fill #0

## 2022-04-02 ENCOUNTER — Other Ambulatory Visit (HOSPITAL_BASED_OUTPATIENT_CLINIC_OR_DEPARTMENT_OTHER): Payer: Self-pay

## 2022-04-03 DIAGNOSIS — E039 Hypothyroidism, unspecified: Secondary | ICD-10-CM | POA: Diagnosis not present

## 2022-04-03 DIAGNOSIS — M797 Fibromyalgia: Secondary | ICD-10-CM | POA: Diagnosis not present

## 2022-04-03 DIAGNOSIS — M79672 Pain in left foot: Secondary | ICD-10-CM | POA: Diagnosis not present

## 2022-04-03 DIAGNOSIS — G5620 Lesion of ulnar nerve, unspecified upper limb: Secondary | ICD-10-CM | POA: Diagnosis not present

## 2022-04-03 DIAGNOSIS — M159 Polyosteoarthritis, unspecified: Secondary | ICD-10-CM | POA: Diagnosis not present

## 2022-04-03 DIAGNOSIS — I10 Essential (primary) hypertension: Secondary | ICD-10-CM | POA: Diagnosis not present

## 2022-04-03 DIAGNOSIS — R739 Hyperglycemia, unspecified: Secondary | ICD-10-CM | POA: Diagnosis not present

## 2022-04-03 DIAGNOSIS — R55 Syncope and collapse: Secondary | ICD-10-CM | POA: Diagnosis not present

## 2022-04-03 DIAGNOSIS — R519 Headache, unspecified: Secondary | ICD-10-CM | POA: Diagnosis not present

## 2022-04-03 DIAGNOSIS — G4762 Sleep related leg cramps: Secondary | ICD-10-CM | POA: Diagnosis not present

## 2022-04-03 DIAGNOSIS — G4700 Insomnia, unspecified: Secondary | ICD-10-CM | POA: Diagnosis not present

## 2022-04-03 DIAGNOSIS — F419 Anxiety disorder, unspecified: Secondary | ICD-10-CM | POA: Diagnosis not present

## 2022-04-18 DIAGNOSIS — R0689 Other abnormalities of breathing: Secondary | ICD-10-CM | POA: Diagnosis not present

## 2022-04-18 DIAGNOSIS — Y999 Unspecified external cause status: Secondary | ICD-10-CM | POA: Diagnosis not present

## 2022-04-18 DIAGNOSIS — R55 Syncope and collapse: Secondary | ICD-10-CM | POA: Diagnosis not present

## 2022-04-18 DIAGNOSIS — T782XXA Anaphylactic shock, unspecified, initial encounter: Secondary | ICD-10-CM | POA: Diagnosis not present

## 2022-04-18 DIAGNOSIS — T7809XA Anaphylactic reaction due to other food products, initial encounter: Secondary | ICD-10-CM | POA: Diagnosis not present

## 2022-04-18 DIAGNOSIS — G4489 Other headache syndrome: Secondary | ICD-10-CM | POA: Diagnosis not present

## 2022-04-18 DIAGNOSIS — X58XXXA Exposure to other specified factors, initial encounter: Secondary | ICD-10-CM | POA: Diagnosis not present

## 2022-04-18 DIAGNOSIS — R1084 Generalized abdominal pain: Secondary | ICD-10-CM | POA: Diagnosis not present

## 2022-04-18 DIAGNOSIS — R112 Nausea with vomiting, unspecified: Secondary | ICD-10-CM | POA: Diagnosis not present

## 2022-04-23 DIAGNOSIS — L501 Idiopathic urticaria: Secondary | ICD-10-CM | POA: Diagnosis not present

## 2022-05-02 DIAGNOSIS — G4762 Sleep related leg cramps: Secondary | ICD-10-CM | POA: Diagnosis not present

## 2022-05-02 DIAGNOSIS — R739 Hyperglycemia, unspecified: Secondary | ICD-10-CM | POA: Diagnosis not present

## 2022-05-02 DIAGNOSIS — I10 Essential (primary) hypertension: Secondary | ICD-10-CM | POA: Diagnosis not present

## 2022-05-02 DIAGNOSIS — Z6838 Body mass index (BMI) 38.0-38.9, adult: Secondary | ICD-10-CM | POA: Diagnosis not present

## 2022-05-02 DIAGNOSIS — G4700 Insomnia, unspecified: Secondary | ICD-10-CM | POA: Diagnosis not present

## 2022-05-07 DIAGNOSIS — Z6837 Body mass index (BMI) 37.0-37.9, adult: Secondary | ICD-10-CM | POA: Diagnosis not present

## 2022-05-07 DIAGNOSIS — W57XXXA Bitten or stung by nonvenomous insect and other nonvenomous arthropods, initial encounter: Secondary | ICD-10-CM | POA: Diagnosis not present

## 2022-05-07 DIAGNOSIS — S20469S Insect bite (nonvenomous) of unspecified back wall of thorax, sequela: Secondary | ICD-10-CM | POA: Diagnosis not present

## 2022-05-13 DIAGNOSIS — R739 Hyperglycemia, unspecified: Secondary | ICD-10-CM | POA: Diagnosis not present

## 2022-05-13 DIAGNOSIS — R519 Headache, unspecified: Secondary | ICD-10-CM | POA: Diagnosis not present

## 2022-05-13 DIAGNOSIS — M159 Polyosteoarthritis, unspecified: Secondary | ICD-10-CM | POA: Diagnosis not present

## 2022-05-13 DIAGNOSIS — G5620 Lesion of ulnar nerve, unspecified upper limb: Secondary | ICD-10-CM | POA: Diagnosis not present

## 2022-05-13 DIAGNOSIS — G4700 Insomnia, unspecified: Secondary | ICD-10-CM | POA: Diagnosis not present

## 2022-05-13 DIAGNOSIS — G4762 Sleep related leg cramps: Secondary | ICD-10-CM | POA: Diagnosis not present

## 2022-05-13 DIAGNOSIS — M79672 Pain in left foot: Secondary | ICD-10-CM | POA: Diagnosis not present

## 2022-05-13 DIAGNOSIS — L299 Pruritus, unspecified: Secondary | ICD-10-CM | POA: Diagnosis not present

## 2022-05-13 DIAGNOSIS — E039 Hypothyroidism, unspecified: Secondary | ICD-10-CM | POA: Diagnosis not present

## 2022-05-13 DIAGNOSIS — I10 Essential (primary) hypertension: Secondary | ICD-10-CM | POA: Diagnosis not present

## 2022-05-13 DIAGNOSIS — R55 Syncope and collapse: Secondary | ICD-10-CM | POA: Diagnosis not present

## 2022-05-13 DIAGNOSIS — F419 Anxiety disorder, unspecified: Secondary | ICD-10-CM | POA: Diagnosis not present

## 2022-05-18 DIAGNOSIS — M7532 Calcific tendinitis of left shoulder: Secondary | ICD-10-CM | POA: Diagnosis not present

## 2022-05-18 DIAGNOSIS — R0781 Pleurodynia: Secondary | ICD-10-CM | POA: Diagnosis not present

## 2022-05-18 DIAGNOSIS — Z90710 Acquired absence of both cervix and uterus: Secondary | ICD-10-CM | POA: Diagnosis not present

## 2022-05-18 DIAGNOSIS — S161XXA Strain of muscle, fascia and tendon at neck level, initial encounter: Secondary | ICD-10-CM | POA: Diagnosis not present

## 2022-05-18 DIAGNOSIS — R1032 Left lower quadrant pain: Secondary | ICD-10-CM | POA: Diagnosis not present

## 2022-05-18 DIAGNOSIS — S299XXA Unspecified injury of thorax, initial encounter: Secondary | ICD-10-CM | POA: Diagnosis not present

## 2022-05-18 DIAGNOSIS — M25561 Pain in right knee: Secondary | ICD-10-CM | POA: Diagnosis not present

## 2022-05-18 DIAGNOSIS — S301XXA Contusion of abdominal wall, initial encounter: Secondary | ICD-10-CM | POA: Diagnosis not present

## 2022-05-18 DIAGNOSIS — N281 Cyst of kidney, acquired: Secondary | ICD-10-CM | POA: Diagnosis not present

## 2022-05-18 DIAGNOSIS — R519 Headache, unspecified: Secondary | ICD-10-CM | POA: Diagnosis not present

## 2022-05-18 DIAGNOSIS — E041 Nontoxic single thyroid nodule: Secondary | ICD-10-CM | POA: Diagnosis not present

## 2022-05-18 DIAGNOSIS — S8992XA Unspecified injury of left lower leg, initial encounter: Secondary | ICD-10-CM | POA: Diagnosis not present

## 2022-05-18 DIAGNOSIS — M542 Cervicalgia: Secondary | ICD-10-CM | POA: Diagnosis not present

## 2022-05-18 DIAGNOSIS — Z9049 Acquired absence of other specified parts of digestive tract: Secondary | ICD-10-CM | POA: Diagnosis not present

## 2022-05-18 DIAGNOSIS — M25562 Pain in left knee: Secondary | ICD-10-CM | POA: Diagnosis not present

## 2022-05-18 DIAGNOSIS — S8991XA Unspecified injury of right lower leg, initial encounter: Secondary | ICD-10-CM | POA: Diagnosis not present

## 2022-05-18 DIAGNOSIS — Z041 Encounter for examination and observation following transport accident: Secondary | ICD-10-CM | POA: Diagnosis not present

## 2022-05-18 DIAGNOSIS — M25512 Pain in left shoulder: Secondary | ICD-10-CM | POA: Diagnosis not present

## 2022-05-18 DIAGNOSIS — R1031 Right lower quadrant pain: Secondary | ICD-10-CM | POA: Diagnosis not present

## 2022-05-21 ENCOUNTER — Other Ambulatory Visit: Payer: Self-pay | Admitting: Gastroenterology

## 2022-05-21 DIAGNOSIS — K219 Gastro-esophageal reflux disease without esophagitis: Secondary | ICD-10-CM

## 2022-05-23 ENCOUNTER — Ambulatory Visit: Payer: Medicare Other | Admitting: Cardiology

## 2022-05-24 ENCOUNTER — Other Ambulatory Visit: Payer: Self-pay | Admitting: Endocrinology

## 2022-05-24 ENCOUNTER — Other Ambulatory Visit (INDEPENDENT_AMBULATORY_CARE_PROVIDER_SITE_OTHER): Payer: Medicare Other

## 2022-05-24 DIAGNOSIS — E042 Nontoxic multinodular goiter: Secondary | ICD-10-CM

## 2022-05-24 DIAGNOSIS — L501 Idiopathic urticaria: Secondary | ICD-10-CM | POA: Diagnosis not present

## 2022-05-24 LAB — TSH: TSH: 0.48 u[IU]/mL (ref 0.35–5.50)

## 2022-05-24 LAB — T3, FREE: T3, Free: 2.8 pg/mL (ref 2.3–4.2)

## 2022-05-24 LAB — T4, FREE: Free T4: 0.92 ng/dL (ref 0.60–1.60)

## 2022-05-28 ENCOUNTER — Ambulatory Visit (INDEPENDENT_AMBULATORY_CARE_PROVIDER_SITE_OTHER): Payer: Medicare Other | Admitting: Endocrinology

## 2022-05-28 ENCOUNTER — Encounter: Payer: Self-pay | Admitting: Endocrinology

## 2022-05-28 VITALS — BP 118/72 | HR 88 | Ht 66.0 in | Wt 232.0 lb

## 2022-05-28 DIAGNOSIS — E042 Nontoxic multinodular goiter: Secondary | ICD-10-CM

## 2022-05-28 NOTE — Progress Notes (Signed)
Patient ID: Kelly Haas, female   DOB: July 20, 1971, 50 y.o.   MRN: [id number]           Chief complaint: Follow-up of thyroid    History of Present Illness:   Baseline history on initial consultation as follows: The patient's thyroid dysfunction was first discovered in 2002 after her pregnancy At that time not clear what her symptoms were but she was told that she needed to be on thyroid medication by an endocrinologist who saw her She did not think she felt any different with taking the thyroid supplement She was being followed for some time and her dose was adjusted finally to taking levothyroxine 75 alternating with 50 mcg  However at some point she stopped taking the supplement because she did not feel any different She will subsequently told by her PCP to start back on the supplements about 7 or 8 years ago because of abnormal blood test but she was not having any symptoms.  Again she did not feel any different with taking the thyroid supplement  RECENT HISTORY:  She has been off levothyroxine supplements for a few years because of tendency to low TSH Last TSH in December 21 was normal  GOITER:  She is coming here because she had an incidental CT scan of her neck showing a 2 cm left thyroid nodule and was told to get this further evaluated  No new symptoms of choking or difficulty swallowing, no significant discomfort Thyroid levels are again normal  Last ultrasound of the thyroid in 8/22 showed the following report:  1. Similar findings of borderline thyromegaly and multinodular goiter. No new or enlarging thyroid nodules.  Right lobe measures 6.0 cm and left lobe 5.4 cm, smaller 2. None of the discretely measured thyroid nodules/cysts meet imaging criteria to recommend percutaneous sampling or continued follow-up. The approximately 2.2 x 2.1 x 1.7 cm spongiform/benign-appearing nodule within the inferior pole the left lobe of the thyroid is grossly unchanged   PREVIOUS  ultrasound results:  1. Enlarged multinodular goiter  2. Bilateral thyroid nodules, the majority of which are cystic and do not require FNA or follow-up. 3. 1.9 cm spongiform thyroid nodule in the left inferior thyroid gland. This does not meet criteria for follow-up or FNA. 4. Solid 0.9 cm nodule in the isthmus that does not meet criteria for follow-up or FNA. 5.  Thyroid enlargement present with size of each lobe of about 6.5 cm  Wt Readings from Last 3 Encounters:  05/28/22 232 lb (105.2 kg)  02/04/22 231 lb (104.8 kg)  01/28/22 237 lb (107.5 kg)    Thyroid function studies:  Lab Results  Component Value Date   FREET4 0.92 05/24/2022   FREET4 0.90 10/15/2021   FREET4 0.91 04/10/2021   TSH 0.48 05/24/2022   TSH 0.50 10/15/2021   TSH 0.06 (L) 04/10/2021   Lab Results  Component Value Date   T3FREE 2.8 05/24/2022   T3FREE 3.2 10/15/2021   T3FREE 2.9 04/10/2021   T3FREE 3.0 11/07/2020    TSH on 08/23/2019 was 0.31 with free T4 1.22 and free T3 3.96    Allergies as of 05/28/2022       Reactions   Bee Venom Anaphylaxis   Ibuprofen Hives, Nausea And Vomiting, Rash   Other reaction(s): Vomiting (intolerance)   Acetaminophen    Pass out   Aminolevulinate Derivatives    unknown   Aspirin-acetaminophen-caffeine    unknown   Dapsone    unknown   Dimercaprol  unknown   Methylcellulose    Pass out    Nitrofuran Derivatives    unknown   Phenazopyridine    unknown   Primaquine    unknown   Toluidine Blue    unknown   Celebrex [celecoxib] Hives, Rash   Latex Rash   Oxycodone Rash   Phenergan [promethazine Hcl] Rash        Medication List        Accurate as of May 28, 2022  1:09 PM. If you have any questions, ask your nurse or doctor.          amLODipine 5 MG tablet Commonly known as: NORVASC Take 5 mg by mouth daily.   buPROPion 300 MG 24 hr tablet Commonly known as: WELLBUTRIN XL Take 300 mg by mouth 2 (two) times daily.   clonazePAM 0.5 MG  tablet Commonly known as: KLONOPIN Take 0.5 mg by mouth 2 (two) times daily as needed for anxiety.   cyclobenzaprine 10 MG tablet Commonly known as: FLEXERIL Take 1 tablet (10 mg total) by mouth 3 (three) times daily as needed for Muscle spasms.   dicyclomine 10 MG capsule Commonly known as: BENTYL Take 1 capsule (10 mg total) by mouth 4 (four) times daily as needed for spasms.   EPINEPHrine 0.3 mg/0.3 mL Soaj injection Commonly known as: EPI-PEN Inject 0.3 mg into the muscle as needed for anaphylaxis.   furosemide 20 MG tablet Commonly known as: LASIX Take 20 mg by mouth daily.   hydrOXYzine 25 MG tablet Commonly known as: ATARAX Take 25 mg by mouth every 6 (six) hours as needed.   hyoscyamine 0.125 MG SL tablet Commonly known as: LEVSIN SL Place 1 tablet (0.125 mg total) under the tongue every 6 (six) hours as needed. What changed: reasons to take this   lamoTRIgine 100 MG tablet Commonly known as: LAMICTAL Take 1.5 tablets in the morning, take 2 tablets in the evening.   Lyrica 50 MG capsule Generic drug: pregabalin Take 1 capsule by mouth 2 (two) times daily.   nitroGLYCERIN 0.4 MG SL tablet Commonly known as: NITROSTAT Place 0.4 mg under the tongue every 5 (five) minutes as needed for chest pain.   ondansetron 8 MG disintegrating tablet Commonly known as: ZOFRAN-ODT Take 1 tablet (8 mg total) by mouth every 8 (eight) hours as needed for nausea or vomiting.   pantoprazole 40 MG tablet Commonly known as: PROTONIX Take 1 tablet (40 mg total) by mouth daily.   predniSONE 5 MG tablet Commonly known as: DELTASONE Take 5 mg by mouth as needed for edema (swelling).   rizatriptan 10 MG tablet Commonly known as: Maxalt Take 1 tablet (62m) by mouth at onset of migraine. May repeat 1 tablet in 2 hours. Do not exceed 2 doses in 2 hours or 10 per month.   Xolair 150 MG/ML prefilled syringe Generic drug: omalizumab Inject 150 mg into the skin every 30 (thirty) days.  2 injections   zolpidem 10 MG tablet Commonly known as: AMBIEN Take 10 mg by mouth at bedtime as needed for sleep.        Allergies:  Allergies  Allergen Reactions   Bee Venom Anaphylaxis   Ibuprofen Hives, Nausea And Vomiting and Rash    Other reaction(s): Vomiting (intolerance)   Acetaminophen     Pass out   Aminolevulinate Derivatives     unknown   Aspirin-Acetaminophen-Caffeine     unknown   Dapsone     unknown   Dimercaprol  unknown   Methylcellulose     Pass out    Nitrofuran Derivatives     unknown   Phenazopyridine     unknown   Primaquine     unknown   Toluidine Blue     unknown   Celebrex [Celecoxib] Hives and Rash   Latex Rash   Oxycodone Rash   Phenergan [Promethazine Hcl] Rash    Past Medical History:  Diagnosis Date   Anxiety disorder    Atypical squamous cell changes of undetermined significance (ASCUS) on vaginal cytology with positive high risk human papilloma virus (HPV) 09/11/2020   Formatting of this note might be different from the original. Many years ago before hyst cryo for abnormal pap 2021 ASCUS pap with pos HR HPV. Colpo-possible lesion- appears excoriated at the R cuff. Brushing shows inflammation with slightly positive  Ki67 index.10 days of metrogel. Recheck 3 weeks. Repeat pap in 6 mos. 03/2021 Nl pap with pos HR HPV   Bursitis, trochanteric 04/15/2013   Cardiac murmur 04/19/2021   Common migraine with intractable migraine 06/05/2021   Contracture of tendon sheath 11/26/2018   Depression with anxiety    Drug therapy 02/02/2018   Epistaxis 03/13/2016   Essential hypertension 04/19/2021   Fibromyalgia    G-6-PD deficiency 03/13/2016   GERD (gastroesophageal reflux disease)    Hyperemesis gravidarum 03/13/2016   Hyperlipidemia    Hypertension    Insomnia    Kidney stone    Laryngeal trauma 07/23/2012   Long term current use of opiate analgesic 03/12/2013   Migraine    Multiple joint pain 03/13/2016   Obesity (BMI  35.0-39.9 without comorbidity) 04/19/2021   Onychomycosis due to dermatophyte 11/03/2018   Osteoarthritis of left hip 03/12/2013   Ovarian cyst 03/13/2016   Pain due to onychomycosis of toenail of left foot 02/19/2018   Pain syndrome, chronic 03/12/2013   Peripheral polyneuropathy 02/02/2018   Pharyngitis 03/13/2016   Positive ANA (antinuclear antibody) 03/13/2016   Sacroiliac pain 03/12/2013   Seizure disorder ([place]) 11/13/2017   Syncope and collapse 01/01/2016   Thoracic spondylosis 03/12/2013   Thyroid disease    Vaginal bleeding 05/20/2019   Formatting of this note might be different from the original.  Prior hyst ~2005 for benign indications Bleeding 05/2019 Postcoital bleeding reported 08/2020   Voice hoarseness 02/02/2018    Past Surgical History:  Procedure Laterality Date   ABDOMINAL HYSTERECTOMY     APPENDECTOMY     CHOLECYSTECTOMY     ESOPHAGOGASTRODUODENOSCOPY  08/29/2017   Mild gastritis. [place] post empiric esophageal dilitation.    TOE SURGERY Left    removed toe nail   TONSILLECTOMY AND ADENOIDECTOMY      Family History  Problem Relation Age of Onset   Cancer Mother    Diabetes Mother    Hypertension Mother    Kidney cancer Mother    Heart attack Father        31   Seizures Father    Thyroid cancer Sister        Patient reported   Thyroid cancer Maternal Aunt    Hypertension Maternal Grandmother    Lung cancer Maternal Grandmother    Stroke Maternal Grandmother    Heart attack Maternal Grandmother    Hypertension Maternal Grandfather    Colon cancer Maternal Grandfather    Pancreatic cancer Maternal Grandfather    Prostate cancer Maternal Grandfather    Lung cancer Maternal Grandfather    Hypertension Paternal Grandmother    Hypertension Paternal Merchant [employer] officer  Esophageal cancer Neg Hx    Stomach cancer Neg Hx    Rectal cancer Neg Hx     Social History:  reports that she has never smoked. She has never used smokeless tobacco. She reports that  she does not drink alcohol and does not use drugs.   Review of Systems     Examination:   BP 118/72   Pulse 88   Ht '5\' 6"'  (1.676 m)   Wt 232 lb (105.2 kg)   SpO2 99%   BMI 37.45 kg/m      THYROID exam:  Right lobe is diffusely enlarged, 1.5-2 times normal, mildly nodular  Left lobe just palpable and about 1-1/2 times normal smooth and relatively soft  Indistinct nodule is felt on swallowing in the left medial lobe close to the isthmus No tenderness No lymphadenopathy in the neck  Biceps reflexes show normal relaxation  Assessment/Plan:   Multinodular goiter with mostly small or cystic or spongiform nodules These were unchanged when she had an ultrasound done in August 2022 Also size of the goiter was smaller in 2022 compared to previous ultrasound  Today's exam shows that the thyroid enlargement is stable and no new nodules palpable Thyroid functions again normal  Discussed that the CT scan recently done is showing the previously diagnosed nodule on the left side which was benign appearing and the size is not any larger than before Explained to her that since there was no increase in size of the thyroid nodules last year this indicates benign nature of her nodules  Patient is however concerned about her thyroid nodules and follow-up ultrasound will be done to compare   Elayne Snare 05/28/2022

## 2022-06-03 DIAGNOSIS — F419 Anxiety disorder, unspecified: Secondary | ICD-10-CM | POA: Diagnosis not present

## 2022-06-03 DIAGNOSIS — I10 Essential (primary) hypertension: Secondary | ICD-10-CM | POA: Diagnosis not present

## 2022-06-03 DIAGNOSIS — L309 Dermatitis, unspecified: Secondary | ICD-10-CM | POA: Diagnosis not present

## 2022-06-03 DIAGNOSIS — R55 Syncope and collapse: Secondary | ICD-10-CM | POA: Diagnosis not present

## 2022-06-03 DIAGNOSIS — G4700 Insomnia, unspecified: Secondary | ICD-10-CM | POA: Diagnosis not present

## 2022-06-03 DIAGNOSIS — R519 Headache, unspecified: Secondary | ICD-10-CM | POA: Diagnosis not present

## 2022-06-03 DIAGNOSIS — M159 Polyosteoarthritis, unspecified: Secondary | ICD-10-CM | POA: Diagnosis not present

## 2022-06-03 DIAGNOSIS — E039 Hypothyroidism, unspecified: Secondary | ICD-10-CM | POA: Diagnosis not present

## 2022-06-03 DIAGNOSIS — M79672 Pain in left foot: Secondary | ICD-10-CM | POA: Diagnosis not present

## 2022-06-03 DIAGNOSIS — M797 Fibromyalgia: Secondary | ICD-10-CM | POA: Diagnosis not present

## 2022-06-03 DIAGNOSIS — R739 Hyperglycemia, unspecified: Secondary | ICD-10-CM | POA: Diagnosis not present

## 2022-06-03 DIAGNOSIS — G5620 Lesion of ulnar nerve, unspecified upper limb: Secondary | ICD-10-CM | POA: Diagnosis not present

## 2022-06-10 ENCOUNTER — Ambulatory Visit
Admission: RE | Admit: 2022-06-10 | Discharge: 2022-06-10 | Disposition: A | Discharge status: Home / Self Care | Payer: Medicare Other | Source: Ambulatory Visit | Attending: Endocrinology | Admitting: Endocrinology

## 2022-06-10 DIAGNOSIS — E041 Nontoxic single thyroid nodule: Secondary | ICD-10-CM | POA: Diagnosis not present

## 2022-06-10 DIAGNOSIS — E010 Iodine-deficiency related diffuse (endemic) goiter: Secondary | ICD-10-CM | POA: Diagnosis not present

## 2022-06-10 DIAGNOSIS — E042 Nontoxic multinodular goiter: Secondary | ICD-10-CM

## 2022-06-11 NOTE — Progress Notes (Signed)
Please call to let patient know that the thyroid ultrasound shows no significant change and only small stable benign-appearing nodules mostly containing fluid or protein material, no intervention needed and no need for surgical consultation.  Largest nodule is only 1 inch across

## 2022-06-13 DIAGNOSIS — Z1231 Encounter for screening mammogram for malignant neoplasm of breast: Secondary | ICD-10-CM | POA: Diagnosis not present

## 2022-06-17 ENCOUNTER — Ambulatory Visit: Payer: Medicare Other | Admitting: Cardiology

## 2022-06-24 DIAGNOSIS — R519 Headache, unspecified: Secondary | ICD-10-CM | POA: Diagnosis not present

## 2022-06-24 DIAGNOSIS — R110 Nausea: Secondary | ICD-10-CM | POA: Diagnosis not present

## 2022-06-24 DIAGNOSIS — M159 Polyosteoarthritis, unspecified: Secondary | ICD-10-CM | POA: Diagnosis not present

## 2022-06-24 DIAGNOSIS — G5620 Lesion of ulnar nerve, unspecified upper limb: Secondary | ICD-10-CM | POA: Diagnosis not present

## 2022-06-24 DIAGNOSIS — M25512 Pain in left shoulder: Secondary | ICD-10-CM | POA: Diagnosis not present

## 2022-06-24 DIAGNOSIS — R55 Syncope and collapse: Secondary | ICD-10-CM | POA: Diagnosis not present

## 2022-06-24 DIAGNOSIS — M797 Fibromyalgia: Secondary | ICD-10-CM | POA: Diagnosis not present

## 2022-06-24 DIAGNOSIS — E039 Hypothyroidism, unspecified: Secondary | ICD-10-CM | POA: Diagnosis not present

## 2022-06-24 DIAGNOSIS — Z6836 Body mass index (BMI) 36.0-36.9, adult: Secondary | ICD-10-CM | POA: Diagnosis not present

## 2022-06-24 DIAGNOSIS — E042 Nontoxic multinodular goiter: Secondary | ICD-10-CM | POA: Diagnosis not present

## 2022-06-25 DIAGNOSIS — R531 Weakness: Secondary | ICD-10-CM | POA: Diagnosis not present

## 2022-06-25 DIAGNOSIS — L501 Idiopathic urticaria: Secondary | ICD-10-CM | POA: Diagnosis not present

## 2022-06-25 DIAGNOSIS — L200 Besnier's prurigo: Secondary | ICD-10-CM | POA: Diagnosis not present

## 2022-07-03 DIAGNOSIS — F419 Anxiety disorder, unspecified: Secondary | ICD-10-CM | POA: Diagnosis not present

## 2022-07-03 DIAGNOSIS — G4762 Sleep related leg cramps: Secondary | ICD-10-CM | POA: Diagnosis not present

## 2022-07-03 DIAGNOSIS — R519 Headache, unspecified: Secondary | ICD-10-CM | POA: Diagnosis not present

## 2022-07-03 DIAGNOSIS — E039 Hypothyroidism, unspecified: Secondary | ICD-10-CM | POA: Diagnosis not present

## 2022-07-03 DIAGNOSIS — R609 Edema, unspecified: Secondary | ICD-10-CM | POA: Diagnosis not present

## 2022-07-03 DIAGNOSIS — K219 Gastro-esophageal reflux disease without esophagitis: Secondary | ICD-10-CM | POA: Diagnosis not present

## 2022-07-03 DIAGNOSIS — F3341 Major depressive disorder, recurrent, in partial remission: Secondary | ICD-10-CM | POA: Diagnosis not present

## 2022-07-03 DIAGNOSIS — G4700 Insomnia, unspecified: Secondary | ICD-10-CM | POA: Diagnosis not present

## 2022-07-03 DIAGNOSIS — M25512 Pain in left shoulder: Secondary | ICD-10-CM | POA: Diagnosis not present

## 2022-07-03 DIAGNOSIS — I10 Essential (primary) hypertension: Secondary | ICD-10-CM | POA: Diagnosis not present

## 2022-07-03 DIAGNOSIS — M159 Polyosteoarthritis, unspecified: Secondary | ICD-10-CM | POA: Diagnosis not present

## 2022-07-03 DIAGNOSIS — M797 Fibromyalgia: Secondary | ICD-10-CM | POA: Diagnosis not present

## 2022-07-08 DIAGNOSIS — M25512 Pain in left shoulder: Secondary | ICD-10-CM | POA: Diagnosis not present

## 2022-07-08 DIAGNOSIS — E039 Hypothyroidism, unspecified: Secondary | ICD-10-CM | POA: Diagnosis not present

## 2022-07-08 DIAGNOSIS — R519 Headache, unspecified: Secondary | ICD-10-CM | POA: Diagnosis not present

## 2022-07-08 DIAGNOSIS — N1831 Chronic kidney disease, stage 3a: Secondary | ICD-10-CM | POA: Diagnosis not present

## 2022-07-08 DIAGNOSIS — G4700 Insomnia, unspecified: Secondary | ICD-10-CM | POA: Diagnosis not present

## 2022-07-08 DIAGNOSIS — G4762 Sleep related leg cramps: Secondary | ICD-10-CM | POA: Diagnosis not present

## 2022-07-08 DIAGNOSIS — I10 Essential (primary) hypertension: Secondary | ICD-10-CM | POA: Diagnosis not present

## 2022-07-08 DIAGNOSIS — R609 Edema, unspecified: Secondary | ICD-10-CM | POA: Diagnosis not present

## 2022-07-08 DIAGNOSIS — M159 Polyosteoarthritis, unspecified: Secondary | ICD-10-CM | POA: Diagnosis not present

## 2022-07-08 DIAGNOSIS — F419 Anxiety disorder, unspecified: Secondary | ICD-10-CM | POA: Diagnosis not present

## 2022-07-08 DIAGNOSIS — F3341 Major depressive disorder, recurrent, in partial remission: Secondary | ICD-10-CM | POA: Diagnosis not present

## 2022-07-08 DIAGNOSIS — M797 Fibromyalgia: Secondary | ICD-10-CM | POA: Diagnosis not present

## 2022-07-15 DIAGNOSIS — M7532 Calcific tendinitis of left shoulder: Secondary | ICD-10-CM | POA: Diagnosis not present

## 2022-07-16 ENCOUNTER — Other Ambulatory Visit: Payer: Self-pay

## 2022-07-17 ENCOUNTER — Ambulatory Visit: Payer: Medicare Other | Admitting: Cardiology

## 2022-07-18 ENCOUNTER — Encounter: Payer: Self-pay | Admitting: Cardiology

## 2022-07-18 ENCOUNTER — Ambulatory Visit: Payer: Medicare Other | Attending: Cardiology | Admitting: Cardiology

## 2022-07-18 VITALS — BP 118/78 | HR 94 | Ht 65.0 in | Wt 226.1 lb

## 2022-07-18 DIAGNOSIS — E669 Obesity, unspecified: Secondary | ICD-10-CM | POA: Insufficient documentation

## 2022-07-18 DIAGNOSIS — I10 Essential (primary) hypertension: Secondary | ICD-10-CM | POA: Diagnosis not present

## 2022-07-18 NOTE — Progress Notes (Signed)
Cardiology Office Note:    Date:  07/18/2022   ID:  Kelly Haas, DOB 1971/06/04, MRN [id number]  PCP:  Cher Nakai, MD  Cardiologist:  Jenean Lindau, MD   Referring MD: Cher Nakai, MD    ASSESSMENT:    1. Essential hypertension   2. Obesity (BMI 35.0-39.9 without comorbidity)    PLAN:    In order of problems listed above:  Primary prevention stressed with patient.  Importance of compliance with diet medication stressed and she vocalized understanding. Essential hypertension: Blood pressure stable and diet was emphasized.  Lifestyle modification urged.  Salt intake issues were discussed. Obesity: Weight reduction stressed.  Importance of exercise stressed she was advised to walk at least half an hour a day 5 days a week and she promises to do so.  Risks of obesity explained. Patient will be seen in follow-up appointment in 12 months or earlier if the patient has any concerns    Medication Adjustments/Labs and Tests Ordered: Current medicines are reviewed at length with the patient today.  Concerns regarding medicines are outlined above.  No orders of the defined types were placed in this encounter.  No orders of the defined types were placed in this encounter.    No chief complaint on file.    History of Present Illness:    Kelly Haas is a 51 y.o. female.  Patient has past medical history of essential hypertension.  In the remote past she would have issues with syncope.  These have resolved.  She denies any chest pain orthopnea or PND.  At the time of my evaluation, the patient is alert awake oriented and in no distress.  She overall leads a sedentary lifestyle.  Past Medical History:  Diagnosis Date   Anxiety disorder    Atypical squamous cell changes of undetermined significance (ASCUS) on vaginal cytology with positive high risk human papilloma virus (HPV) 09/11/2020   Formatting of this note might be different from the original. Many years ago before hyst  cryo for abnormal pap 2021 ASCUS pap with pos HR HPV. Colpo-possible lesion- appears excoriated at the R cuff. Brushing shows inflammation with slightly positive  Ki67 index.10 days of metrogel. Recheck 3 weeks. Repeat pap in 6 mos. 03/2021 Nl pap with pos HR HPV   Bursitis, trochanteric 04/15/2013   Cardiac murmur 04/19/2021   Common migraine with intractable migraine 06/05/2021   Contracture of tendon sheath 11/26/2018   Depression with anxiety    Drug therapy 02/02/2018   Epistaxis 03/13/2016   Essential hypertension 04/19/2021   Fibromyalgia    G-6-PD deficiency 03/13/2016   GERD (gastroesophageal reflux disease)    Hyperemesis gravidarum 03/13/2016   Hyperlipidemia    Hypertension    Insomnia    Kidney stone    Laryngeal trauma 07/23/2012   Long term current use of opiate analgesic 03/12/2013   Migraine    Multiple joint pain 03/13/2016   Obesity (BMI 35.0-39.9 without comorbidity) 04/19/2021   Onychomycosis due to dermatophyte 11/03/2018   Osteoarthritis of left hip 03/12/2013   Ovarian cyst 03/13/2016   Pain due to onychomycosis of toenail of left foot 02/19/2018   Pain syndrome, chronic 03/12/2013   Peripheral polyneuropathy 02/02/2018   Pharyngitis 03/13/2016   Positive ANA (antinuclear antibody) 03/13/2016   Sacroiliac pain 03/12/2013   Seizure disorder ([employer]) 11/13/2017   Syncope and collapse 01/01/2016   Thoracic spondylosis 03/12/2013   Thyroid disease    Vaginal bleeding 05/20/2019   Formatting of this note might  be different from the original.  Prior hyst ~2005 for benign indications Bleeding 05/2019 Postcoital bleeding reported 08/2020   Voice hoarseness 02/02/2018    Past Surgical History:  Procedure Laterality Date   ABDOMINAL HYSTERECTOMY     APPENDECTOMY     CHOLECYSTECTOMY     ESOPHAGOGASTRODUODENOSCOPY  08/29/2017   Mild gastritis. [place] post empiric esophageal dilitation.    TOE SURGERY Left    removed toe nail   TONSILLECTOMY AND ADENOIDECTOMY       Current Medications: Current Meds  Medication Sig   amLODipine (NORVASC) 5 MG tablet Take 5 mg by mouth daily.    betamethasone dipropionate 0.05 % cream Apply 1 Application topically 2 (two) times daily.   buPROPion (WELLBUTRIN XL) 300 MG 24 hr tablet Take 300 mg by mouth 2 (two) times daily.   busPIRone (BUSPAR) 30 MG tablet Take 30 mg by mouth 2 (two) times daily.   clonazePAM (KLONOPIN) 0.5 MG tablet Take 0.5 mg by mouth 2 (two) times daily as needed for anxiety.   cyclobenzaprine (FLEXERIL) 10 MG tablet Take 1 tablet (10 mg total) by mouth 3 (three) times daily as needed for Muscle spasms.   dicyclomine (BENTYL) 10 MG capsule Take 1 capsule (10 mg total) by mouth 4 (four) times daily as needed for spasms.   EPINEPHrine 0.3 mg/0.3 mL IJ SOAJ injection Inject 0.3 mg into the muscle as needed for anaphylaxis.   escitalopram (LEXAPRO) 10 MG tablet Take 10 mg by mouth daily.   fluticasone (FLONASE) 50 MCG/ACT nasal spray Place 2 sprays into both nostrils daily.   furosemide (LASIX) 20 MG tablet Take 20 mg by mouth daily.   hydrOXYzine (ATARAX) 25 MG tablet Take 25 mg by mouth every 6 (six) hours as needed for itching.   lamoTRIgine (LAMICTAL) 100 MG tablet Take 1.5 tablets in the morning, take 2 tablets in the evening.   LYRICA 50 MG capsule Take 1 capsule by mouth 2 (two) times daily.    nitroGLYCERIN (NITROSTAT) 0.4 MG SL tablet Place 0.4 mg under the tongue every 5 (five) minutes as needed for chest pain.   ondansetron (ZOFRAN-ODT) 8 MG disintegrating tablet Take 1 tablet (8 mg total) by mouth every 8 (eight) hours as needed for nausea or vomiting.   pantoprazole (PROTONIX) 40 MG tablet Take 1 tablet (40 mg total) by mouth daily.   rizatriptan (MAXALT) 10 MG tablet Take 1 tablet (14m) by mouth at onset of migraine. May repeat 1 tablet in 2 hours. Do not exceed 2 doses in 2 hours or 10 per month.   XOLAIR 150 MG/ML prefilled syringe Inject 150 mg into the skin every 30 (thirty) days. 2  injections   zolpidem (AMBIEN) 10 MG tablet Take 10 mg by mouth at bedtime as needed for sleep.     Allergies:   Bee venom, Ibuprofen, Acetaminophen, Aminolevulinate derivatives, Aspirin-acetaminophen-caffeine, Dapsone, Dimercaprol, Methylcellulose, Nitrofuran derivatives, Phenazopyridine, Primaquine, Toluidine blue, Celebrex [celecoxib], Latex, Oxycodone, and Phenergan [promethazine hcl]   Social History   Socioeconomic History   Marital status: Single    Spouse name: Not on file   Number of children: 3   Years of education: 14   Highest education level: Associate degree: occupational, tHotel manager or vocational program  Occupational History   Occupation: Disabled  Tobacco Use   Smoking status: Never   Smokeless tobacco: Never  Vaping Use   Vaping Use: Never used  Substance and Sexual Activity   Alcohol use: No   Drug use: No  Sexual activity: Not on file  Other Topics Concern   Not on file  Social History Narrative   Lives at home with daughter, son and grandson.   Right-handed.   No caffeine use.   Social Determinants of Health   Financial Resource Strain: Not on file  Food Insecurity: Not on file  Transportation Needs: Not on file  Physical Activity: Not on file  Stress: Not on file  Social Connections: Not on file     Family History: The patient's family history includes Cancer in her mother; Colon cancer in her maternal grandfather; Diabetes in her mother; Heart attack in her father and maternal grandmother; Hypertension in her maternal grandfather, maternal grandmother, mother, paternal grandfather, and paternal grandmother; Kidney cancer in her mother; Lung cancer in her maternal grandfather and maternal grandmother; Pancreatic cancer in her maternal grandfather; Prostate cancer in her maternal grandfather; Seizures in her father; Stroke in her maternal grandmother; Thyroid cancer in her maternal aunt and sister. There is no history of Esophageal cancer, Stomach  cancer, or Rectal cancer.  ROS:   Please see the history of present illness.    All other systems reviewed and are negative.  EKGs/Labs/Other Studies Reviewed:    The following studies were reviewed today: I discussed my findings with the patient at length.  EKG normal sinus rhythm nonspecific ST-T changes   Recent Labs: 10/25/2021: ALT 10 02/04/2022: BUN 8; Creatinine, Ser 1.05; Hemoglobin 13.1; Platelets 335; Potassium 4.2; Sodium 140 05/24/2022: TSH 0.48  Recent Lipid Panel No results found for: "CHOL", "TRIG", "HDL", "CHOLHDL", "VLDL", "LDLCALC", "LDLDIRECT"  Physical Exam:    VS:  BP 118/78   Pulse 94   Ht '5\' 5"'  (1.651 m)   Wt 226 lb 1.3 oz (102.5 kg)   SpO2 94%   BMI 37.62 kg/m     Wt Readings from Last 3 Encounters:  07/18/22 226 lb 1.3 oz (102.5 kg)  05/28/22 232 lb (105.2 kg)  02/04/22 231 lb (104.8 kg)     GEN: Patient is in no acute distress HEENT: Normal NECK: No JVD; No carotid bruits LYMPHATICS: No lymphadenopathy CARDIAC: Hear sounds regular, 2/6 systolic murmur at the apex. RESPIRATORY:  Clear to auscultation without rales, wheezing or rhonchi  ABDOMEN: Soft, non-tender, non-distended MUSCULOSKELETAL:  No edema; No deformity  SKIN: Warm and dry NEUROLOGIC:  Alert and oriented x 3 PSYCHIATRIC:  Normal affect   Signed, Jenean Lindau, MD  07/18/2022 10:00 AM    [facility]

## 2022-07-18 NOTE — Patient Instructions (Signed)

## 2022-07-19 DIAGNOSIS — M25512 Pain in left shoulder: Secondary | ICD-10-CM | POA: Diagnosis not present

## 2022-07-25 DIAGNOSIS — M75112 Incomplete rotator cuff tear or rupture of left shoulder, not specified as traumatic: Secondary | ICD-10-CM | POA: Diagnosis not present

## 2022-07-25 DIAGNOSIS — M67811 Other specified disorders of synovium, right shoulder: Secondary | ICD-10-CM | POA: Diagnosis not present

## 2022-07-26 DIAGNOSIS — S40012D Contusion of left shoulder, subsequent encounter: Secondary | ICD-10-CM | POA: Diagnosis not present

## 2022-07-29 DIAGNOSIS — S40012D Contusion of left shoulder, subsequent encounter: Secondary | ICD-10-CM | POA: Diagnosis not present

## 2022-07-31 DIAGNOSIS — I10 Essential (primary) hypertension: Secondary | ICD-10-CM | POA: Diagnosis not present

## 2022-07-31 DIAGNOSIS — G4762 Sleep related leg cramps: Secondary | ICD-10-CM | POA: Diagnosis not present

## 2022-07-31 DIAGNOSIS — M25512 Pain in left shoulder: Secondary | ICD-10-CM | POA: Diagnosis not present

## 2022-07-31 DIAGNOSIS — M159 Polyosteoarthritis, unspecified: Secondary | ICD-10-CM | POA: Diagnosis not present

## 2022-07-31 DIAGNOSIS — G4700 Insomnia, unspecified: Secondary | ICD-10-CM | POA: Diagnosis not present

## 2022-07-31 DIAGNOSIS — N1831 Chronic kidney disease, stage 3a: Secondary | ICD-10-CM | POA: Diagnosis not present

## 2022-07-31 DIAGNOSIS — R609 Edema, unspecified: Secondary | ICD-10-CM | POA: Diagnosis not present

## 2022-07-31 DIAGNOSIS — R519 Headache, unspecified: Secondary | ICD-10-CM | POA: Diagnosis not present

## 2022-07-31 DIAGNOSIS — F3341 Major depressive disorder, recurrent, in partial remission: Secondary | ICD-10-CM | POA: Diagnosis not present

## 2022-07-31 DIAGNOSIS — E039 Hypothyroidism, unspecified: Secondary | ICD-10-CM | POA: Diagnosis not present

## 2022-07-31 DIAGNOSIS — F419 Anxiety disorder, unspecified: Secondary | ICD-10-CM | POA: Diagnosis not present

## 2022-07-31 DIAGNOSIS — S40012D Contusion of left shoulder, subsequent encounter: Secondary | ICD-10-CM | POA: Diagnosis not present

## 2022-07-31 DIAGNOSIS — M797 Fibromyalgia: Secondary | ICD-10-CM | POA: Diagnosis not present

## 2022-08-05 DIAGNOSIS — R0602 Shortness of breath: Secondary | ICD-10-CM | POA: Diagnosis not present

## 2022-08-05 DIAGNOSIS — R9431 Abnormal electrocardiogram [ECG] [EKG]: Secondary | ICD-10-CM | POA: Diagnosis not present

## 2022-08-05 DIAGNOSIS — R0789 Other chest pain: Secondary | ICD-10-CM | POA: Diagnosis not present

## 2022-08-05 DIAGNOSIS — R079 Chest pain, unspecified: Secondary | ICD-10-CM | POA: Diagnosis not present

## 2022-08-05 DIAGNOSIS — R1111 Vomiting without nausea: Secondary | ICD-10-CM | POA: Diagnosis not present

## 2022-08-05 DIAGNOSIS — I517 Cardiomegaly: Secondary | ICD-10-CM | POA: Diagnosis not present

## 2022-08-05 DIAGNOSIS — I959 Hypotension, unspecified: Secondary | ICD-10-CM | POA: Diagnosis not present

## 2022-08-06 DIAGNOSIS — S40012D Contusion of left shoulder, subsequent encounter: Secondary | ICD-10-CM | POA: Diagnosis not present

## 2022-08-08 DIAGNOSIS — S40012D Contusion of left shoulder, subsequent encounter: Secondary | ICD-10-CM | POA: Diagnosis not present

## 2022-08-13 DIAGNOSIS — S40012D Contusion of left shoulder, subsequent encounter: Secondary | ICD-10-CM | POA: Diagnosis not present

## 2022-08-14 DIAGNOSIS — I10 Essential (primary) hypertension: Secondary | ICD-10-CM | POA: Diagnosis not present

## 2022-08-14 DIAGNOSIS — E039 Hypothyroidism, unspecified: Secondary | ICD-10-CM | POA: Diagnosis not present

## 2022-08-14 DIAGNOSIS — M25512 Pain in left shoulder: Secondary | ICD-10-CM | POA: Diagnosis not present

## 2022-08-14 DIAGNOSIS — M159 Polyosteoarthritis, unspecified: Secondary | ICD-10-CM | POA: Diagnosis not present

## 2022-08-14 DIAGNOSIS — M797 Fibromyalgia: Secondary | ICD-10-CM | POA: Diagnosis not present

## 2022-08-14 DIAGNOSIS — N1831 Chronic kidney disease, stage 3a: Secondary | ICD-10-CM | POA: Diagnosis not present

## 2022-08-14 DIAGNOSIS — G4762 Sleep related leg cramps: Secondary | ICD-10-CM | POA: Diagnosis not present

## 2022-08-14 DIAGNOSIS — F419 Anxiety disorder, unspecified: Secondary | ICD-10-CM | POA: Diagnosis not present

## 2022-08-14 DIAGNOSIS — R519 Headache, unspecified: Secondary | ICD-10-CM | POA: Diagnosis not present

## 2022-08-14 DIAGNOSIS — G4700 Insomnia, unspecified: Secondary | ICD-10-CM | POA: Diagnosis not present

## 2022-08-14 DIAGNOSIS — R609 Edema, unspecified: Secondary | ICD-10-CM | POA: Diagnosis not present

## 2022-08-14 DIAGNOSIS — F3341 Major depressive disorder, recurrent, in partial remission: Secondary | ICD-10-CM | POA: Diagnosis not present

## 2022-08-19 DIAGNOSIS — S40012D Contusion of left shoulder, subsequent encounter: Secondary | ICD-10-CM | POA: Diagnosis not present

## 2022-08-21 DIAGNOSIS — M75112 Incomplete rotator cuff tear or rupture of left shoulder, not specified as traumatic: Secondary | ICD-10-CM | POA: Diagnosis not present

## 2022-08-22 ENCOUNTER — Other Ambulatory Visit: Payer: Self-pay | Admitting: Gastroenterology

## 2022-08-22 DIAGNOSIS — R519 Headache, unspecified: Secondary | ICD-10-CM | POA: Diagnosis not present

## 2022-08-22 DIAGNOSIS — I10 Essential (primary) hypertension: Secondary | ICD-10-CM | POA: Diagnosis not present

## 2022-08-22 DIAGNOSIS — G4762 Sleep related leg cramps: Secondary | ICD-10-CM | POA: Diagnosis not present

## 2022-08-22 DIAGNOSIS — R5383 Other fatigue: Secondary | ICD-10-CM | POA: Diagnosis not present

## 2022-08-22 DIAGNOSIS — F419 Anxiety disorder, unspecified: Secondary | ICD-10-CM | POA: Diagnosis not present

## 2022-08-22 DIAGNOSIS — N1831 Chronic kidney disease, stage 3a: Secondary | ICD-10-CM | POA: Diagnosis not present

## 2022-08-22 DIAGNOSIS — M797 Fibromyalgia: Secondary | ICD-10-CM | POA: Diagnosis not present

## 2022-08-22 DIAGNOSIS — E039 Hypothyroidism, unspecified: Secondary | ICD-10-CM | POA: Diagnosis not present

## 2022-08-22 DIAGNOSIS — M159 Polyosteoarthritis, unspecified: Secondary | ICD-10-CM | POA: Diagnosis not present

## 2022-08-22 DIAGNOSIS — G4700 Insomnia, unspecified: Secondary | ICD-10-CM | POA: Diagnosis not present

## 2022-08-22 DIAGNOSIS — R609 Edema, unspecified: Secondary | ICD-10-CM | POA: Diagnosis not present

## 2022-08-22 DIAGNOSIS — M25512 Pain in left shoulder: Secondary | ICD-10-CM | POA: Diagnosis not present

## 2022-08-22 DIAGNOSIS — E785 Hyperlipidemia, unspecified: Secondary | ICD-10-CM | POA: Diagnosis not present

## 2022-08-27 DIAGNOSIS — E039 Hypothyroidism, unspecified: Secondary | ICD-10-CM | POA: Diagnosis not present

## 2022-08-27 DIAGNOSIS — Z808 Family history of malignant neoplasm of other organs or systems: Secondary | ICD-10-CM | POA: Diagnosis not present

## 2022-08-27 DIAGNOSIS — E042 Nontoxic multinodular goiter: Secondary | ICD-10-CM

## 2022-08-27 DIAGNOSIS — Z8639 Personal history of other endocrine, nutritional and metabolic disease: Secondary | ICD-10-CM

## 2022-08-27 HISTORY — DX: Nontoxic multinodular goiter: E04.2

## 2022-08-27 HISTORY — DX: Personal history of other endocrine, nutritional and metabolic disease: Z86.39

## 2022-08-29 DIAGNOSIS — N1831 Chronic kidney disease, stage 3a: Secondary | ICD-10-CM | POA: Diagnosis not present

## 2022-08-29 DIAGNOSIS — H6692 Otitis media, unspecified, left ear: Secondary | ICD-10-CM | POA: Diagnosis not present

## 2022-08-29 DIAGNOSIS — R7989 Other specified abnormal findings of blood chemistry: Secondary | ICD-10-CM | POA: Diagnosis not present

## 2022-08-29 DIAGNOSIS — R519 Headache, unspecified: Secondary | ICD-10-CM | POA: Diagnosis not present

## 2022-08-29 DIAGNOSIS — M159 Polyosteoarthritis, unspecified: Secondary | ICD-10-CM | POA: Diagnosis not present

## 2022-08-29 DIAGNOSIS — F419 Anxiety disorder, unspecified: Secondary | ICD-10-CM | POA: Diagnosis not present

## 2022-08-29 DIAGNOSIS — I10 Essential (primary) hypertension: Secondary | ICD-10-CM | POA: Diagnosis not present

## 2022-08-29 DIAGNOSIS — G4700 Insomnia, unspecified: Secondary | ICD-10-CM | POA: Diagnosis not present

## 2022-08-29 DIAGNOSIS — E039 Hypothyroidism, unspecified: Secondary | ICD-10-CM | POA: Diagnosis not present

## 2022-08-29 DIAGNOSIS — H6092 Unspecified otitis externa, left ear: Secondary | ICD-10-CM | POA: Diagnosis not present

## 2022-08-29 DIAGNOSIS — G4762 Sleep related leg cramps: Secondary | ICD-10-CM | POA: Diagnosis not present

## 2022-08-29 DIAGNOSIS — M25512 Pain in left shoulder: Secondary | ICD-10-CM | POA: Diagnosis not present

## 2022-09-02 ENCOUNTER — Other Ambulatory Visit: Payer: Self-pay

## 2022-09-02 DIAGNOSIS — L501 Idiopathic urticaria: Secondary | ICD-10-CM | POA: Diagnosis not present

## 2022-09-06 ENCOUNTER — Ambulatory Visit: Payer: Self-pay | Admitting: Licensed Clinical Social Worker

## 2022-09-06 NOTE — Patient Outreach (Signed)
  Care Coordination   Initial Visit Note   09/06/2022 Name: Kelly Haas MRN: [id number] DOB: 08/06/1971  Kelly Haas is a 51 y.o. year old female who sees Cher Nakai, MD for primary care. I spoke with  Kelly Haas by phone today.  What matters to the patients health and wellness today?  Food Resources    Goals Addressed               This Visit's Progress     Care coordination Activities- Food Resources (pt-stated)        Patient requesting assistance with food resources. Patient receives FNS benefits , but needs additional resources. SW submitted [place] referral for local food banks and pantry's. SW educated patient on food banks and pantries.   Patient discussed housing increase and awaiting final decision from landlord.  Patient discussed upcoming shoulder surgery.         SDOH assessments and interventions completed:  Yes  SDOH Interventions Today    Flowsheet Row Most Recent Value  SDOH Interventions   Food Insecurity Interventions [id number] Referral        Care Coordination Interventions Activated:  Yes  Care Coordination Interventions:  Yes, provided   Follow up plan: No further intervention required.   Encounter Outcome:  Pt. Visit Completed

## 2022-09-06 NOTE — Patient Instructions (Signed)
Visit Information  Thank you for taking time to visit with me today. Please don't hesitate to contact me if I can be of assistance to you.   Following are the goals we discussed today:   Goals Addressed               This Visit's Progress     Care coordination Activities- Food Resources (pt-stated)        Patient requesting assistance with food resources. Patient receives FNS benefits , but needs additional resources. SW submitted [place] referral for local food banks and pantry's. SW educated patient on food banks and pantries.   Patient discussed housing increase and awaiting final decision from landlord.  Patient discussed upcoming shoulder surgery.          Patient verbalizes understanding of instructions and care plan provided today and agrees to view in [place]. Active MyChart status and patient understanding of how to access instructions and care plan via MyChart confirmed with patient.     No further follow up required: .  Milus Height, Arita Miss , MSW, [place] Social Worker [facility]  [phone]

## 2022-09-07 DIAGNOSIS — G4700 Insomnia, unspecified: Secondary | ICD-10-CM | POA: Diagnosis not present

## 2022-09-07 DIAGNOSIS — N1831 Chronic kidney disease, stage 3a: Secondary | ICD-10-CM | POA: Diagnosis not present

## 2022-09-07 DIAGNOSIS — F419 Anxiety disorder, unspecified: Secondary | ICD-10-CM | POA: Diagnosis not present

## 2022-09-07 DIAGNOSIS — G4762 Sleep related leg cramps: Secondary | ICD-10-CM | POA: Diagnosis not present

## 2022-09-07 DIAGNOSIS — R7989 Other specified abnormal findings of blood chemistry: Secondary | ICD-10-CM | POA: Diagnosis not present

## 2022-09-07 DIAGNOSIS — M159 Polyosteoarthritis, unspecified: Secondary | ICD-10-CM | POA: Diagnosis not present

## 2022-09-07 DIAGNOSIS — M25512 Pain in left shoulder: Secondary | ICD-10-CM | POA: Diagnosis not present

## 2022-09-07 DIAGNOSIS — M797 Fibromyalgia: Secondary | ICD-10-CM | POA: Diagnosis not present

## 2022-09-07 DIAGNOSIS — R519 Headache, unspecified: Secondary | ICD-10-CM | POA: Diagnosis not present

## 2022-09-07 DIAGNOSIS — E039 Hypothyroidism, unspecified: Secondary | ICD-10-CM | POA: Diagnosis not present

## 2022-09-07 DIAGNOSIS — R609 Edema, unspecified: Secondary | ICD-10-CM | POA: Diagnosis not present

## 2022-09-07 DIAGNOSIS — I10 Essential (primary) hypertension: Secondary | ICD-10-CM | POA: Diagnosis not present

## 2022-09-09 DIAGNOSIS — E042 Nontoxic multinodular goiter: Secondary | ICD-10-CM | POA: Diagnosis not present

## 2022-09-10 DIAGNOSIS — S43432A Superior glenoid labrum lesion of left shoulder, initial encounter: Secondary | ICD-10-CM | POA: Diagnosis not present

## 2022-09-10 DIAGNOSIS — S43432S Superior glenoid labrum lesion of left shoulder, sequela: Secondary | ICD-10-CM | POA: Diagnosis not present

## 2022-09-10 DIAGNOSIS — M7552 Bursitis of left shoulder: Secondary | ICD-10-CM | POA: Diagnosis not present

## 2022-09-10 DIAGNOSIS — M7542 Impingement syndrome of left shoulder: Secondary | ICD-10-CM | POA: Diagnosis not present

## 2022-09-10 DIAGNOSIS — G8918 Other acute postprocedural pain: Secondary | ICD-10-CM | POA: Diagnosis not present

## 2022-09-10 DIAGNOSIS — M19012 Primary osteoarthritis, left shoulder: Secondary | ICD-10-CM | POA: Diagnosis not present

## 2022-09-20 DIAGNOSIS — M19012 Primary osteoarthritis, left shoulder: Secondary | ICD-10-CM | POA: Diagnosis not present

## 2022-09-23 DIAGNOSIS — M25612 Stiffness of left shoulder, not elsewhere classified: Secondary | ICD-10-CM | POA: Diagnosis not present

## 2022-09-25 DIAGNOSIS — M25612 Stiffness of left shoulder, not elsewhere classified: Secondary | ICD-10-CM | POA: Diagnosis not present

## 2022-09-30 DIAGNOSIS — L501 Idiopathic urticaria: Secondary | ICD-10-CM | POA: Diagnosis not present

## 2022-10-01 DIAGNOSIS — M25612 Stiffness of left shoulder, not elsewhere classified: Secondary | ICD-10-CM | POA: Diagnosis not present

## 2022-10-03 DIAGNOSIS — M25612 Stiffness of left shoulder, not elsewhere classified: Secondary | ICD-10-CM | POA: Diagnosis not present

## 2022-10-08 DIAGNOSIS — I10 Essential (primary) hypertension: Secondary | ICD-10-CM | POA: Diagnosis not present

## 2022-10-08 DIAGNOSIS — E2740 Unspecified adrenocortical insufficiency: Secondary | ICD-10-CM | POA: Diagnosis not present

## 2022-10-08 DIAGNOSIS — M25512 Pain in left shoulder: Secondary | ICD-10-CM | POA: Diagnosis not present

## 2022-10-08 DIAGNOSIS — M797 Fibromyalgia: Secondary | ICD-10-CM | POA: Diagnosis not present

## 2022-10-08 DIAGNOSIS — E039 Hypothyroidism, unspecified: Secondary | ICD-10-CM | POA: Diagnosis not present

## 2022-10-08 DIAGNOSIS — R519 Headache, unspecified: Secondary | ICD-10-CM | POA: Diagnosis not present

## 2022-10-08 DIAGNOSIS — N1831 Chronic kidney disease, stage 3a: Secondary | ICD-10-CM | POA: Diagnosis not present

## 2022-10-08 DIAGNOSIS — M159 Polyosteoarthritis, unspecified: Secondary | ICD-10-CM | POA: Diagnosis not present

## 2022-10-08 DIAGNOSIS — R609 Edema, unspecified: Secondary | ICD-10-CM | POA: Diagnosis not present

## 2022-10-08 DIAGNOSIS — G4700 Insomnia, unspecified: Secondary | ICD-10-CM | POA: Diagnosis not present

## 2022-10-08 DIAGNOSIS — F419 Anxiety disorder, unspecified: Secondary | ICD-10-CM | POA: Diagnosis not present

## 2022-10-08 DIAGNOSIS — G4762 Sleep related leg cramps: Secondary | ICD-10-CM | POA: Diagnosis not present

## 2022-10-09 DIAGNOSIS — H11133 Conjunctival pigmentations, bilateral: Secondary | ICD-10-CM | POA: Diagnosis not present

## 2022-10-09 DIAGNOSIS — H524 Presbyopia: Secondary | ICD-10-CM | POA: Diagnosis not present

## 2022-10-09 DIAGNOSIS — H43393 Other vitreous opacities, bilateral: Secondary | ICD-10-CM | POA: Diagnosis not present

## 2022-10-09 DIAGNOSIS — H5203 Hypermetropia, bilateral: Secondary | ICD-10-CM | POA: Diagnosis not present

## 2022-10-09 DIAGNOSIS — M25612 Stiffness of left shoulder, not elsewhere classified: Secondary | ICD-10-CM | POA: Diagnosis not present

## 2022-10-16 DIAGNOSIS — M25612 Stiffness of left shoulder, not elsewhere classified: Secondary | ICD-10-CM | POA: Diagnosis not present

## 2022-10-21 DIAGNOSIS — J029 Acute pharyngitis, unspecified: Secondary | ICD-10-CM | POA: Diagnosis not present

## 2022-10-21 DIAGNOSIS — R058 Other specified cough: Secondary | ICD-10-CM | POA: Diagnosis not present

## 2022-10-21 DIAGNOSIS — R110 Nausea: Secondary | ICD-10-CM | POA: Diagnosis not present

## 2022-10-21 DIAGNOSIS — B349 Viral infection, unspecified: Secondary | ICD-10-CM | POA: Diagnosis not present

## 2022-10-21 DIAGNOSIS — R6883 Chills (without fever): Secondary | ICD-10-CM | POA: Diagnosis not present

## 2022-10-21 DIAGNOSIS — R197 Diarrhea, unspecified: Secondary | ICD-10-CM | POA: Diagnosis not present

## 2022-10-24 DIAGNOSIS — R768 Other specified abnormal immunological findings in serum: Secondary | ICD-10-CM | POA: Diagnosis not present

## 2022-10-24 DIAGNOSIS — D75A Glucose-6-phosphate dehydrogenase (G6PD) deficiency without anemia: Secondary | ICD-10-CM | POA: Diagnosis not present

## 2022-10-24 DIAGNOSIS — M2550 Pain in unspecified joint: Secondary | ICD-10-CM | POA: Diagnosis not present

## 2022-10-28 DIAGNOSIS — L501 Idiopathic urticaria: Secondary | ICD-10-CM | POA: Diagnosis not present

## 2022-11-05 DIAGNOSIS — Z91030 Bee allergy status: Secondary | ICD-10-CM | POA: Diagnosis not present

## 2022-11-05 DIAGNOSIS — E669 Obesity, unspecified: Secondary | ICD-10-CM | POA: Diagnosis not present

## 2022-11-05 DIAGNOSIS — Z6838 Body mass index (BMI) 38.0-38.9, adult: Secondary | ICD-10-CM | POA: Diagnosis not present

## 2022-11-05 DIAGNOSIS — K219 Gastro-esophageal reflux disease without esophagitis: Secondary | ICD-10-CM | POA: Diagnosis not present

## 2022-11-05 DIAGNOSIS — Z90722 Acquired absence of ovaries, bilateral: Secondary | ICD-10-CM | POA: Diagnosis not present

## 2022-11-05 DIAGNOSIS — Z9049 Acquired absence of other specified parts of digestive tract: Secondary | ICD-10-CM | POA: Diagnosis not present

## 2022-11-05 DIAGNOSIS — M797 Fibromyalgia: Secondary | ICD-10-CM | POA: Diagnosis not present

## 2022-11-05 DIAGNOSIS — Z885 Allergy status to narcotic agent status: Secondary | ICD-10-CM | POA: Diagnosis not present

## 2022-11-05 DIAGNOSIS — Z888 Allergy status to other drugs, medicaments and biological substances status: Secondary | ICD-10-CM | POA: Diagnosis not present

## 2022-11-05 DIAGNOSIS — Z79890 Hormone replacement therapy (postmenopausal): Secondary | ICD-10-CM | POA: Diagnosis not present

## 2022-11-05 DIAGNOSIS — Z90710 Acquired absence of both cervix and uterus: Secondary | ICD-10-CM | POA: Diagnosis not present

## 2022-11-05 DIAGNOSIS — Z79899 Other long term (current) drug therapy: Secondary | ICD-10-CM | POA: Diagnosis not present

## 2022-11-05 DIAGNOSIS — R569 Unspecified convulsions: Secondary | ICD-10-CM | POA: Diagnosis not present

## 2022-11-05 DIAGNOSIS — Z886 Allergy status to analgesic agent status: Secondary | ICD-10-CM | POA: Diagnosis not present

## 2022-11-05 DIAGNOSIS — R000 Tachycardia, unspecified: Secondary | ICD-10-CM | POA: Diagnosis not present

## 2022-11-05 DIAGNOSIS — F411 Generalized anxiety disorder: Secondary | ICD-10-CM | POA: Diagnosis not present

## 2022-11-05 DIAGNOSIS — Z91040 Latex allergy status: Secondary | ICD-10-CM | POA: Diagnosis not present

## 2022-11-05 DIAGNOSIS — E039 Hypothyroidism, unspecified: Secondary | ICD-10-CM | POA: Diagnosis not present

## 2022-11-05 DIAGNOSIS — Z87442 Personal history of urinary calculi: Secondary | ICD-10-CM | POA: Diagnosis not present

## 2022-11-05 DIAGNOSIS — U071 COVID-19: Secondary | ICD-10-CM | POA: Diagnosis not present

## 2022-11-05 DIAGNOSIS — F32A Depression, unspecified: Secondary | ICD-10-CM | POA: Diagnosis not present

## 2022-11-07 DIAGNOSIS — E2740 Unspecified adrenocortical insufficiency: Secondary | ICD-10-CM | POA: Diagnosis not present

## 2022-11-07 DIAGNOSIS — M25512 Pain in left shoulder: Secondary | ICD-10-CM | POA: Diagnosis not present

## 2022-11-07 DIAGNOSIS — G4700 Insomnia, unspecified: Secondary | ICD-10-CM | POA: Diagnosis not present

## 2022-11-07 DIAGNOSIS — U071 COVID-19: Secondary | ICD-10-CM | POA: Diagnosis not present

## 2022-11-07 DIAGNOSIS — G4762 Sleep related leg cramps: Secondary | ICD-10-CM | POA: Diagnosis not present

## 2022-11-07 DIAGNOSIS — F3341 Major depressive disorder, recurrent, in partial remission: Secondary | ICD-10-CM | POA: Diagnosis not present

## 2022-11-07 DIAGNOSIS — N1831 Chronic kidney disease, stage 3a: Secondary | ICD-10-CM | POA: Diagnosis not present

## 2022-11-07 DIAGNOSIS — M159 Polyosteoarthritis, unspecified: Secondary | ICD-10-CM | POA: Diagnosis not present

## 2022-11-07 DIAGNOSIS — I10 Essential (primary) hypertension: Secondary | ICD-10-CM | POA: Diagnosis not present

## 2022-11-07 DIAGNOSIS — R519 Headache, unspecified: Secondary | ICD-10-CM | POA: Diagnosis not present

## 2022-11-07 DIAGNOSIS — F419 Anxiety disorder, unspecified: Secondary | ICD-10-CM | POA: Diagnosis not present

## 2022-11-07 DIAGNOSIS — E039 Hypothyroidism, unspecified: Secondary | ICD-10-CM | POA: Diagnosis not present

## 2022-11-14 DIAGNOSIS — F419 Anxiety disorder, unspecified: Secondary | ICD-10-CM | POA: Diagnosis not present

## 2022-11-14 DIAGNOSIS — I10 Essential (primary) hypertension: Secondary | ICD-10-CM | POA: Diagnosis not present

## 2022-11-14 DIAGNOSIS — M25512 Pain in left shoulder: Secondary | ICD-10-CM | POA: Diagnosis not present

## 2022-11-14 DIAGNOSIS — E2740 Unspecified adrenocortical insufficiency: Secondary | ICD-10-CM | POA: Diagnosis not present

## 2022-11-14 DIAGNOSIS — G4762 Sleep related leg cramps: Secondary | ICD-10-CM | POA: Diagnosis not present

## 2022-11-14 DIAGNOSIS — M159 Polyosteoarthritis, unspecified: Secondary | ICD-10-CM | POA: Diagnosis not present

## 2022-11-14 DIAGNOSIS — N1831 Chronic kidney disease, stage 3a: Secondary | ICD-10-CM | POA: Diagnosis not present

## 2022-11-14 DIAGNOSIS — R519 Headache, unspecified: Secondary | ICD-10-CM | POA: Diagnosis not present

## 2022-11-14 DIAGNOSIS — J069 Acute upper respiratory infection, unspecified: Secondary | ICD-10-CM | POA: Diagnosis not present

## 2022-11-14 DIAGNOSIS — E039 Hypothyroidism, unspecified: Secondary | ICD-10-CM | POA: Diagnosis not present

## 2022-11-14 DIAGNOSIS — F3341 Major depressive disorder, recurrent, in partial remission: Secondary | ICD-10-CM | POA: Diagnosis not present

## 2022-11-14 DIAGNOSIS — U071 COVID-19: Secondary | ICD-10-CM | POA: Diagnosis not present

## 2022-11-27 ENCOUNTER — Telehealth: Payer: Self-pay | Admitting: Family Medicine

## 2022-11-27 MED ORDER — RIZATRIPTAN BENZOATE 10 MG PO TABS: ORAL_TABLET | ORAL | 3 refills | Status: DC

## 2022-11-27 NOTE — Addendum Note (Signed)
Addended by: Verlin Grills on: 11/27/2022 04:08 PM   Modules accepted: Orders

## 2022-11-27 NOTE — Telephone Encounter (Signed)
Pt is requesting a refill for rizatriptan (MAXALT) 10 MG tablet & Imitrex .  Pharmacy: [facility]  Pt confirmed no changes to insurance with [place]

## 2022-11-27 NOTE — Telephone Encounter (Signed)
Refill sent as requested. 

## 2022-12-30 ENCOUNTER — Encounter: Payer: Self-pay | Admitting: Neurology

## 2023-01-01 ENCOUNTER — Ambulatory Visit: Payer: Medicare Other | Admitting: Neurology

## 2023-01-06 ENCOUNTER — Encounter: Payer: Self-pay | Admitting: Neurology

## 2023-01-06 ENCOUNTER — Ambulatory Visit (INDEPENDENT_AMBULATORY_CARE_PROVIDER_SITE_OTHER): Payer: Medicare Other | Admitting: Neurology

## 2023-01-06 VITALS — BP 141/93 | HR 90 | Ht 65.0 in | Wt 230.0 lb

## 2023-01-06 DIAGNOSIS — Z5181 Encounter for therapeutic drug level monitoring: Secondary | ICD-10-CM

## 2023-01-06 DIAGNOSIS — F32A Depression, unspecified: Secondary | ICD-10-CM

## 2023-01-06 DIAGNOSIS — E559 Vitamin D deficiency, unspecified: Secondary | ICD-10-CM

## 2023-01-06 DIAGNOSIS — F419 Anxiety disorder, unspecified: Secondary | ICD-10-CM

## 2023-01-06 DIAGNOSIS — G43709 Chronic migraine without aura, not intractable, without status migrainosus: Secondary | ICD-10-CM | POA: Diagnosis not present

## 2023-01-06 DIAGNOSIS — G40909 Epilepsy, unspecified, not intractable, without status epilepticus: Secondary | ICD-10-CM | POA: Diagnosis not present

## 2023-01-06 MED ORDER — TOPIRAMATE 50 MG PO TABS: 50.0000 mg | ORAL_TABLET | Freq: Every evening | ORAL | 6 refills | Status: DC

## 2023-01-06 MED ORDER — RIZATRIPTAN BENZOATE 10 MG PO TABS: ORAL_TABLET | ORAL | 3 refills | Status: DC

## 2023-01-06 MED ORDER — LAMOTRIGINE ER 200 MG PO TB24: 400.0000 mg | ORAL_TABLET | Freq: Every day | ORAL | 3 refills | Status: DC

## 2023-01-06 NOTE — Patient Instructions (Addendum)
Increase Lamotrigine to 200 mg twice daily  Will switch you to Lamotrigine XR 400 mg daily  Will check Lamotrigine level with Vit D  Restart Topiramate and Maxalt for the headaches  Referral to psychiatry for management on ongoing depression and insomnia  Return in 3 months or sooner if worse
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
## 2023-01-07 ENCOUNTER — Telehealth: Payer: Self-pay | Admitting: Neurology

## 2023-01-07 LAB — VITAMIN D 25 HYDROXY (VIT D DEFICIENCY, FRACTURES): Vit D, 25-Hydroxy: 28.4 ng/mL — ABNORMAL LOW (ref 30.0–100.0)

## 2023-01-07 LAB — LAMOTRIGINE LEVEL: Lamotrigine Lvl: 10.3 ug/mL (ref 2.0–20.0)

## 2023-01-07 NOTE — Telephone Encounter (Signed)
Referral for psychiatry fax to [facility]. Phone:[phone], Fax: [phone]

## 2023-01-21 ENCOUNTER — Telehealth: Payer: Self-pay | Admitting: *Deleted

## 2023-01-21 NOTE — Telephone Encounter (Signed)
Received form from [facility] disbility access services.  To be completed for pt.

## 2023-01-22 NOTE — Telephone Encounter (Signed)
Form completed.

## 2023-01-27 ENCOUNTER — Telehealth: Payer: Self-pay | Admitting: *Deleted

## 2023-01-27 NOTE — Telephone Encounter (Signed)
I faxed pt [facility] form on 01/27/2023 [phone]

## 2023-01-27 NOTE — Telephone Encounter (Signed)
Pt called and stated she call [facility] and they informed her they haven't received a fax. Pt is requesting form be re-faxed to [phone].

## 2023-01-28 NOTE — Telephone Encounter (Signed)
Pt [place] form re faxed to [phone] on 01/28/2023

## 2023-02-04 ENCOUNTER — Ambulatory Visit: Payer: Medicare Other | Admitting: Neurology

## 2023-02-05 ENCOUNTER — Encounter: Payer: Self-pay | Admitting: Neurology

## 2023-02-19 ENCOUNTER — Telehealth: Payer: Self-pay | Admitting: Neurology

## 2023-02-19 ENCOUNTER — Encounter: Payer: Self-pay | Admitting: Neurology

## 2023-02-19 NOTE — Telephone Encounter (Signed)
Pt states when she was contacted by [place] [facility]  what they were mentioning re: her ADHD was not what she thought Dr April Manson was sending her there to be seen about, please call to discuss the reason for the referral

## 2023-02-19 NOTE — Telephone Encounter (Signed)
Called pt and informed her of message nurse Katie sent,  Office note says depression and insomnia are reason for referral, she should reach out to them to review the referral note  Pt said she is still confused about the referral and just needs someone to explain the reason of the referral to her.

## 2023-02-19 NOTE — Telephone Encounter (Signed)
Called and spoke to patient please see other phone note

## 2023-03-20 ENCOUNTER — Telehealth: Payer: Self-pay | Admitting: Gastroenterology

## 2023-03-20 ENCOUNTER — Other Ambulatory Visit: Payer: Self-pay

## 2023-03-20 DIAGNOSIS — R109 Unspecified abdominal pain: Secondary | ICD-10-CM

## 2023-03-20 MED ORDER — LIDOCAINE VISCOUS HCL 2 % MT SOLN: 15.0000 mL | Freq: Three times a day (TID) | OROMUCOSAL | 0 refills | Status: DC | PRN

## 2023-03-20 NOTE — Telephone Encounter (Signed)
Patient called in with complaints of burning in her upper abdomen that is worse after meals that started 1 week ago. Pain 6/10. She was seen in the ED last week after an allergic reaction (unknown cause) and was given breathing treatments, prednisone, and zofran. Since then she has started having this burning sensation. Previously on protonix 40 mg QD, but she has increased it to BID & maalox TID prn with no relief. She was last seen for EGD with Dr. Chales Abrahams on 10/25/2021. Will route to MD for further recommendations.

## 2023-03-20 NOTE — Telephone Encounter (Signed)
Patient called stating that she has been having a burning sensation in her stomach when she eat. It has been going on for a few weeks now. She states that she has been taking her Protonix medication but she states they are not helping. She is requesting a call back to discuss if there is an alternative medication she could try. Please advise, thank you.

## 2023-03-20 NOTE — Telephone Encounter (Signed)
Lets try GI cocktail 15 cc p.o. every 8 hours as needed #129ml Once she is off steroids, she will get better RG

## 2023-03-20 NOTE — Telephone Encounter (Signed)
Spoke with patient regarding MD recommendations. GI cocktail order has been faxed to [facility]. Patient advised that pharmacy should contact her when order is ready, however if she has any issues then to call back and let us know. Pt verbalized all understanding.

## 2023-04-07 ENCOUNTER — Ambulatory Visit (INDEPENDENT_AMBULATORY_CARE_PROVIDER_SITE_OTHER): Payer: Medicare Other | Admitting: Allergy and Immunology

## 2023-04-07 ENCOUNTER — Encounter: Payer: Self-pay | Admitting: Allergy and Immunology

## 2023-04-07 VITALS — BP 124/72 | HR 91 | Resp 16 | Ht 65.0 in | Wt 234.4 lb

## 2023-04-07 DIAGNOSIS — L501 Idiopathic urticaria: Secondary | ICD-10-CM

## 2023-04-07 DIAGNOSIS — T782XXA Anaphylactic shock, unspecified, initial encounter: Secondary | ICD-10-CM

## 2023-04-07 MED ORDER — FAMOTIDINE 40 MG PO TABS: 40.0000 mg | ORAL_TABLET | Freq: Every day | ORAL | 5 refills | Status: DC

## 2023-04-07 NOTE — Patient Instructions (Signed)
  1.  Allergen avoidance measures  2.  Prevent recurrent allergic reactions:   A. Loratadine 10 mg - 1 tablet 1 time per day  B. Famotidine 40 mg - 1 tablet 1 time per day  3. Epi-pen, benadryl, MD/ER evaluation for allergic reaction  4. Blood - tryptase, alpha-gal panel, thyroid peroxidase ab  5. Return to clinic in 4 weeks or earlier if problem

## 2023-04-07 NOTE — Progress Notes (Unsigned)
[place] - [place] - [place]   Dear [name],  Thank you for referring Kelly Haas to the [facility] [place] on 04/07/2023.   Below is a summation of this patient's evaluation and recommendations.  Thank you for your referral. I will keep you informed about this patient's response to treatment.   If you have any questions please do not hesitate to contact me.   Sincerely,  Jessica Priest, MD Allergy / Immunology [facility]   ______________________________________________________________________    NEW PATIENT NOTE  Referring Provider: Simone Curia, MD Primary Provider: Simone Curia, MD Date of office visit: 04/07/2023    Subjective:   Chief Complaint:  Kelly Haas (DOB: Apr 05, 1971) is a 52 y.o. female who presents to the clinic on 04/07/2023 with a chief complaint of Allergic Reaction .     HPI: Kelly Haas presents to this clinic in evaluation of recurrent allergic reactions.  She has been receiving omalizumab for many years for her chronic urticaria with very good control not requiring any additional H1 or H2 receptor blockers.  In February 2024 she had an allergic reaction followed by 2 additional allergic reactions with her last 1 occurring in April 2024.  Each 1 of these reactions was very similar.  She had acute onset of coughing and then she had an unrelenting vomiting and then she felt like her throat was swelling and her air cut off.  Epinephrine did not appear to help this issue.  She has been to the emergency room on 2 occasions and was hospitalized overnight with 1 of these episodes.  She does not have any associated diarrhea and she does not have any itchy skin or hives and she does not have any other respiratory tract symptoms.  Her last injection of omalizumab occurred early March 2024 so 1 of these episodes occurred while using omalizumab into them occurred  without the use of omalizumab.  There is not an obvious provoking factor giving rise to this issue.  She has not really had a significant environmental change or change in her diet or change in her medications or the use of supplements or over-the-counter agents.  Past Medical History:  Diagnosis Date   Anxiety disorder    Atypical squamous cell changes of undetermined significance (ASCUS) on vaginal cytology with positive high risk human papilloma virus (HPV) 09/11/2020   Formatting of this note might be different from the original. Many years ago before hyst cryo for abnormal pap 2021 ASCUS pap with pos HR HPV. Colpo-possible lesion- appears excoriated at the R cuff. Brushing shows inflammation with slightly positive  Ki67 index.10 days of metrogel. Recheck 3 weeks. Repeat pap in 6 mos. 03/2021 Nl pap with pos HR HPV   Bursitis, trochanteric 04/15/2013   Cardiac murmur 04/19/2021   Common migraine with intractable migraine 06/05/2021   Contracture of tendon sheath 11/26/2018   Depression with anxiety    Drug therapy 02/02/2018   Epistaxis 03/13/2016   Essential hypertension 04/19/2021   Fibromyalgia    G-6-PD deficiency 03/13/2016   GERD (gastroesophageal reflux disease)    Hyperemesis gravidarum 03/13/2016   Hyperlipidemia    Hypertension    Insomnia    Kidney stone    Laryngeal trauma 07/23/2012   Long term current use of opiate analgesic 03/12/2013   Migraine    Multiple joint pain 03/13/2016   Obesity (BMI 35.0-39.9 without comorbidity) 04/19/2021  Onychomycosis due to dermatophyte 11/03/2018   Osteoarthritis of left hip 03/12/2013   Ovarian cyst 03/13/2016   Pain due to onychomycosis of toenail of left foot 02/19/2018   Pain syndrome, chronic 03/12/2013   Peripheral polyneuropathy 02/02/2018   Pharyngitis 03/13/2016   Positive ANA (antinuclear antibody) 03/13/2016   Sacroiliac pain 03/12/2013   Seizure disorder (HCC) 11/13/2017   Syncope and collapse 01/01/2016    Thoracic spondylosis 03/12/2013   Thyroid disease    Urticaria    Vaginal bleeding 05/20/2019   Formatting of this note might be different from the original.  Prior hyst ~2005 for benign indications Bleeding 05/2019 Postcoital bleeding reported 08/2020   Voice hoarseness 02/02/2018    Past Surgical History:  Procedure Laterality Date   ABDOMINAL HYSTERECTOMY     APPENDECTOMY     CHOLECYSTECTOMY     ESOPHAGOGASTRODUODENOSCOPY  08/29/2017   Mild gastritis. Stauts post empiric esophageal dilitation.    TOE SURGERY Left    removed toe nail   TONSILLECTOMY AND ADENOIDECTOMY      Allergies as of 04/07/2023       Reactions   Bee Venom Anaphylaxis   Ibuprofen Hives, Nausea And Vomiting, Rash   Other reaction(s): Vomiting (intolerance)   Acetaminophen    Pass out   Aminolevulinate Derivatives    unknown   Aspirin Nausea And Vomiting   Aspirin-acetaminophen-caffeine    unknown   Dapsone    unknown   Dimercaprol    unknown   Methylcellulose    Pass out    Nitrofuran Derivatives    unknown   Phenazopyridine    unknown   Primaquine    unknown   Toluidine Blue    unknown   Celebrex [celecoxib] Hives, Rash   Latex Rash   Other Rash, Other (See Comments)   Artichoke- rash around lips, ticklish throat   Oxycodone Rash   Phenergan [promethazine Hcl] Rash        Medication List    amLODipine 5 MG tablet Commonly known as: NORVASC Take 5 mg by mouth daily.   buPROPion 300 MG 24 hr tablet Commonly known as: WELLBUTRIN XL Take 300 mg by mouth 2 (two) times daily.   busPIRone 30 MG tablet Commonly known as: BUSPAR Take 30 mg by mouth 2 (two) times daily.   clonazePAM 0.5 MG tablet Commonly known as: KLONOPIN Take 0.5 mg by mouth 2 (two) times daily as needed for anxiety.   cyclobenzaprine 10 MG tablet Commonly known as: FLEXERIL Take 1 tablet (10 mg total) by mouth 3 (three) times daily as needed for Muscle spasms.   dicyclomine 10 MG capsule Commonly known as:  BENTYL Take 1 capsule (10 mg total) by mouth 4 (four) times daily as needed for spasms.   EPINEPHrine 0.3 mg/0.3 mL Soaj injection Commonly known as: EPI-PEN Inject 0.3 mg into the muscle as needed for anaphylaxis.   escitalopram 10 MG tablet Commonly known as: LEXAPRO Take 10 mg by mouth daily.   furosemide 20 MG tablet Commonly known as: LASIX Take 20 mg by mouth daily.   LamoTRIgine 200 MG Tb24 24 hour tablet Take 2 tablets (400 mg total) by mouth daily.   Lyrica 50 MG capsule Generic drug: pregabalin Take 1 capsule by mouth 2 (two) times daily.   meclizine 25 MG tablet Commonly known as: ANTIVERT Take 25 mg by mouth.   nitroGLYCERIN 0.4 MG SL tablet Commonly known as: NITROSTAT Place 0.4 mg under the tongue every 5 (five) minutes as needed for  chest pain.   ondansetron 8 MG disintegrating tablet Commonly known as: ZOFRAN-ODT Take 1 tablet (8 mg total) by mouth every 8 (eight) hours as needed for nausea or vomiting.   pantoprazole 40 MG tablet Commonly known as: PROTONIX Take 1 tablet (40 mg total) by mouth daily.   topiramate 50 MG tablet Commonly known as: Topamax Take 1 tablet (50 mg total) by mouth at bedtime.   Xolair 150 MG/ML prefilled syringe Generic drug: omalizumab Inject 150 mg into the skin every 30 (thirty) days. 2 injections   zolpidem 10 MG tablet Commonly known as: AMBIEN Take 10 mg by mouth at bedtime as needed for sleep.    Review of systems negative except as noted in HPI / PMHx or noted below:  Review of Systems  Constitutional: Negative.   HENT: Negative.    Eyes: Negative.   Respiratory: Negative.    Cardiovascular: Negative.   Gastrointestinal: Negative.   Genitourinary: Negative.   Musculoskeletal: Negative.   Skin: Negative.   Neurological: Negative.   Endo/Heme/Allergies: Negative.   Psychiatric/Behavioral: Negative.      Family History  Problem Relation Age of Onset   Cancer Mother    Diabetes Mother    Hypertension  Mother    Kidney cancer Mother    Heart attack Father        4   Seizures Father    Thyroid cancer Sister        Patient reported   Thyroid cancer Maternal Aunt    Hypertension Maternal Grandmother    Lung cancer Maternal Grandmother    Stroke Maternal Grandmother    Heart attack Maternal Grandmother    Hypertension Maternal Grandfather    Colon cancer Maternal Grandfather    Pancreatic cancer Maternal Grandfather    Prostate cancer Maternal Grandfather    Lung cancer Maternal Grandfather    Hypertension Paternal Grandmother    Hypertension Paternal Grandfather    Esophageal cancer Neg Hx    Stomach cancer Neg Hx    Rectal cancer Neg Hx     Social History   Socioeconomic History   Marital status: Single    Spouse name: Not on file   Number of children: 3   Years of education: 14   Highest education level: Associate degree: occupational, Scientist, product/process development, or vocational program  Occupational History   Occupation: Disabled  Tobacco Use   Smoking status: Never   Smokeless tobacco: Never  Vaping Use   Vaping Use: Never used  Substance and Sexual Activity   Alcohol use: No   Drug use: No   Sexual activity: Not on file  Other Topics Concern   Not on file  Social History Narrative   Lives at home with daughter, son and grandson.   Right-handed.   No caffeine use.   Social Determinants of Health   Financial Resource Strain: Not on file  Food Insecurity: Food Insecurity Present (09/06/2022)   Hunger Vital Sign    Worried About Running Out of Food in the Last Year: Sometimes true    Ran Out of Food in the Last Year: Never true  Transportation Needs: No Transportation Needs (09/06/2022)   PRAPARE - Administrator, Civil Service (Medical): No    Lack of Transportation (Non-Medical): No  Physical Activity: Not on file  Stress: Not on file  Social Connections: Not on file  Intimate Partner Violence: Not on file    Environmental and Social history  Lives in a  house with a dry  environment, no animals located inside the household, no carpet in the bedroom, plastic on the bed, plastic on the pillow, and no smoking ongoing with inside the household.  Objective:   Vitals:   04/07/23 0933  BP: 124/72  Pulse: 91  Resp: 16  SpO2: 97%   Height: 5\' 5"  (165.1 cm) Weight: 234 lb 6.4 oz (106.3 kg)  Physical Exam Constitutional:      Appearance: She is not diaphoretic.  HENT:     Head: Normocephalic.     Right Ear: Tympanic membrane, ear canal and external ear normal.     Left Ear: Tympanic membrane, ear canal and external ear normal.     Nose: Nose normal. No mucosal edema or rhinorrhea.     Mouth/Throat:     Pharynx: Uvula midline. No oropharyngeal exudate.  Eyes:     Conjunctiva/sclera: Conjunctivae normal.  Neck:     Thyroid: No thyromegaly.     Trachea: Trachea normal. No tracheal tenderness or tracheal deviation.  Cardiovascular:     Rate and Rhythm: Normal rate and regular rhythm.     Heart sounds: Normal heart sounds, S1 normal and S2 normal. No murmur heard. Pulmonary:     Effort: No respiratory distress.     Breath sounds: Normal breath sounds. No stridor. No wheezing or rales.  Lymphadenopathy:     Head:     Right side of head: No tonsillar adenopathy.     Left side of head: No tonsillar adenopathy.     Cervical: No cervical adenopathy.  Skin:    Findings: No erythema or rash.     Nails: There is no clubbing.  Neurological:     Mental Status: She is alert.     Diagnostics: Allergy skin tests were performed.   Results of blood tests obtained 11 March 2023 identified WBC 9.67, absolute eosinophil 0, absolute basophil 100, absolute lymphocyte 4400, hemoglobin 13.5, platelet 359, Creatinine 0.98 MGs/DL, AST 16 U/L, ALT 11 U/L   Assessment and Plan:    1. Anaphylaxis, initial encounter   2. Idiopathic urticaria     Patient Instructions   1.  Allergen avoidance measures  2.  Prevent recurrent allergic  reactions:   A. Loratadine 10 mg - 1 tablet 1 time per day  B. Famotidine 40 mg - 1 tablet 1 time per day  3. Epi-pen, benadryl, MD/ER evaluation for allergic reaction  4. Blood - tryptase, alpha-gal panel, thyroid peroxidase ab  5. Return to clinic in 4 weeks or earlier if problem   Jessica Priest, MD Allergy / Immunology [facility] [place]

## 2023-04-08 ENCOUNTER — Encounter: Payer: Self-pay | Admitting: Allergy and Immunology

## 2023-04-08 LAB — ALPHA-GAL PANEL

## 2023-04-09 LAB — TRYPTASE: Tryptase: 7.4 ug/L (ref 2.2–13.2)

## 2023-04-09 LAB — ALPHA-GAL PANEL

## 2023-04-10 LAB — ALPHA-GAL PANEL
Beef IgE: <0.1 kU/L
IgE (Immunoglobulin E), Serum: 161 IU/mL (ref 6–495)
O215-IgE Alpha-Gal: <0.1 kU/L
Pork IgE: <0.1 kU/L

## 2023-04-10 LAB — THYROID PEROXIDASE ANTIBODY: Thyroperoxidase Ab SerPl-aCnc: 12 IU/mL (ref 0–34)

## 2023-04-16 NOTE — Telephone Encounter (Signed)
Patient needing to be advised on next steps after cocktail. Pease advise.

## 2023-04-17 NOTE — Telephone Encounter (Signed)
Patient called in with complaints of ongoing burning in her upper abdomen. She takes pantoprazole 40 mg QD & completed the GI cocktail that was prescribed to her on 03/20/23. States the cocktail only provided some relief, but the pain was still there. No longer on steroids. She would like to know what else she can do to help with the pain. Will route to MD.

## 2023-04-23 NOTE — Addendum Note (Signed)
Addended by: Richardson Chiquito on: 04/23/2023 11:33 AM   Modules accepted: Orders

## 2023-04-23 NOTE — Telephone Encounter (Signed)
Proceed with solid-phase GES to rule out gastroparesis FU in APP clinic or mine RG

## 2023-04-23 NOTE — Telephone Encounter (Signed)
Patient has been scheduled for follow up with Dr Chales Abrahams at his next available date, 07/15/23 at 10:40 am. Patient has also been scheduled for gastric emptying scan on 05/15/23 at 730 am, 700 am arrival at [facility]. Patient to remain NPO after midnight the night before procedure; no stomach meds. I have spoken to patient regarding Dr Urban Gibson recommendations as well as times/date/location/prep for upcoming appointments. Patient verbalizes understanding and denies any further questions at this time.

## 2023-04-24 ENCOUNTER — Telehealth: Payer: Self-pay | Admitting: Neurology

## 2023-04-24 ENCOUNTER — Telehealth (INDEPENDENT_AMBULATORY_CARE_PROVIDER_SITE_OTHER): Payer: Medicare Other | Admitting: Neurology

## 2023-04-24 ENCOUNTER — Encounter: Payer: Self-pay | Admitting: Neurology

## 2023-04-24 ENCOUNTER — Ambulatory Visit: Payer: Medicare Other | Admitting: Allergy and Immunology

## 2023-04-24 DIAGNOSIS — G40909 Epilepsy, unspecified, not intractable, without status epilepticus: Secondary | ICD-10-CM

## 2023-04-24 DIAGNOSIS — G43709 Chronic migraine without aura, not intractable, without status migrainosus: Secondary | ICD-10-CM | POA: Diagnosis not present

## 2023-04-24 MED ORDER — TOPIRAMATE 100 MG PO TABS: 100.0000 mg | ORAL_TABLET | Freq: Every evening | ORAL | 3 refills | Status: DC

## 2023-04-24 NOTE — Telephone Encounter (Signed)
..   Pt understands that although there may be some limitations with this type of visit, we will take all precautions to reduce any security or privacy concerns.  Pt understands that this will be treated like an in office visit and we will file with pt's insurance, and there may be a patient responsible charge related to this service. ? ?

## 2023-04-24 NOTE — Telephone Encounter (Signed)
Of course!

## 2023-04-24 NOTE — Telephone Encounter (Signed)
Pt wants to know if appointment for today can be mychart appointment? Requesting a call back.
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
## 2023-04-24 NOTE — Patient Instructions (Signed)
Continue with Lamotrigine 400 mg daily  Increase Topiramate  to 100 mg nightly  Continue your other medications  Follow up with your doctors as scheduled  Return in 3 months, video visit

## 2023-05-01 DIAGNOSIS — E559 Vitamin D deficiency, unspecified: Secondary | ICD-10-CM

## 2023-05-01 DIAGNOSIS — R946 Abnormal results of thyroid function studies: Secondary | ICD-10-CM

## 2023-05-01 DIAGNOSIS — E2749 Other adrenocortical insufficiency: Secondary | ICD-10-CM

## 2023-05-01 DIAGNOSIS — R7989 Other specified abnormal findings of blood chemistry: Secondary | ICD-10-CM | POA: Insufficient documentation

## 2023-05-01 HISTORY — DX: Vitamin D deficiency, unspecified: E55.9

## 2023-05-01 HISTORY — DX: Other specified abnormal findings of blood chemistry: R79.89

## 2023-05-01 HISTORY — DX: Abnormal results of thyroid function studies: R94.6

## 2023-05-01 HISTORY — DX: Other adrenocortical insufficiency: E27.49

## 2023-05-14 ENCOUNTER — Ambulatory Visit (INDEPENDENT_AMBULATORY_CARE_PROVIDER_SITE_OTHER): Payer: Medicare Other | Admitting: Allergy and Immunology

## 2023-05-14 ENCOUNTER — Encounter: Payer: Self-pay | Admitting: Allergy and Immunology

## 2023-05-14 VITALS — BP 128/82 | HR 88 | Resp 18

## 2023-05-14 DIAGNOSIS — T782XXD Anaphylactic shock, unspecified, subsequent encounter: Secondary | ICD-10-CM | POA: Diagnosis not present

## 2023-05-14 DIAGNOSIS — L501 Idiopathic urticaria: Secondary | ICD-10-CM

## 2023-05-14 NOTE — Progress Notes (Signed)
[place] - [place] - [place] - [place] - [place]   Follow-up Note  Referring Provider: Simone Curia, MD Primary Provider: Simone Curia, MD Date of Office Visit: 05/14/2023  Subjective:   Kelly Haas (DOB: 1971-10-13) is a 52 y.o. female who [facility] in re-evaluation of the following:  HPI: Kelly Haas returns to this clinic in evaluation of idiopathic urticaria and recent recurrent episodes of anaphylaxis.  I last saw her in this clinic 07 Apr 2023.  In review, she had several anaphylactic reactions with unknown etiologic factor and we placed her on an H1 and H2 receptor blocker during her last evaluation and evaluated her for possible systemic disease contributing to that issue.  She had 1 reaction last week.  She had outdoor exposure around 1:30 PM.  She found her heart was racing fast and she developed shortness of breath and she had a choking sensation as though there is something in her throat and her throat was closing and she used injectable epinephrine and lay down and then she developed a splotchy red rash across her face and chest.  She felt much better after few minutes although she still had to clear her throat for a few hours and she still was splotchy for a the majority of the day.  Once again there was no obvious provoking factor giving rise to this issue.  Her breakfast was 5 hours removed from that event and she did not take any over-the-counter medications around the time of that event.  This occurred while using famotidine and loratadine 1 time per day.  Allergies as of 05/14/2023       Reactions   Bee Venom Anaphylaxis   Ibuprofen Hives, Nausea And Vomiting, Rash   Other reaction(s): Vomiting (intolerance)   Acetaminophen    Pass out   Aminolevulinate Derivatives    unknown   Aspirin Nausea And Vomiting   Aspirin-acetaminophen-caffeine    unknown   Dapsone    unknown   Dimercaprol    unknown   Methylcellulose     Pass out    Nitrofuran Derivatives    unknown   Phenazopyridine    unknown   Primaquine    unknown   Toluidine Blue    unknown   Celebrex [celecoxib] Hives, Rash   Latex Rash   Other Rash, Other (See Comments)   Artichoke- rash around lips, ticklish throat   Oxycodone Rash   Phenergan [promethazine Hcl] Rash        Medication List    amLODipine 5 MG tablet Commonly known as: NORVASC Take 5 mg by mouth daily.   buPROPion 300 MG 24 hr tablet Commonly known as: WELLBUTRIN XL Take 300 mg by mouth 2 (two) times daily.   busPIRone 30 MG tablet Commonly known as: BUSPAR Take 30 mg by mouth 2 (two) times daily.   clonazePAM 0.5 MG tablet Commonly known as: KLONOPIN Take 0.5 mg by mouth 2 (two) times daily as needed for anxiety.   cyclobenzaprine 10 MG tablet Commonly known as: FLEXERIL Take 1 tablet (10 mg total) by mouth 3 (three) times daily as needed for Muscle spasms.   dicyclomine 10 MG capsule Commonly known as: BENTYL Take 1 capsule (10 mg total) by mouth 4 (four) times daily as needed for spasms.   EPINEPHrine 0.3 mg/0.3 mL Soaj injection Commonly known as: EPI-PEN Inject 0.3 mg into the muscle as needed for anaphylaxis.   escitalopram 10 MG tablet Commonly known  asJudye Bos Take 10 mg by mouth daily.   famotidine 40 MG tablet Commonly known as: PEPCID Take 1 tablet (40 mg total) by mouth daily.   furosemide 20 MG tablet Commonly known as: LASIX Take 20 mg by mouth daily.   LamoTRIgine 200 MG Tb24 24 hour tablet Take 2 tablets (400 mg total) by mouth daily.   loratadine 10 MG tablet Commonly known as: CLARITIN Take 10 mg by mouth daily.   Lyrica 50 MG capsule Generic drug: pregabalin Take 1 capsule by mouth 2 (two) times daily.   meclizine 25 MG tablet Commonly known as: ANTIVERT Take 25 mg by mouth.   nitroGLYCERIN 0.4 MG SL tablet Commonly known as: NITROSTAT Place 0.4 mg under the tongue every 5 (five) minutes as needed for chest  pain.   ondansetron 8 MG disintegrating tablet Commonly known as: ZOFRAN-ODT Take 1 tablet (8 mg total) by mouth every 8 (eight) hours as needed for nausea or vomiting.   pantoprazole 40 MG tablet Commonly known as: PROTONIX Take 1 tablet (40 mg total) by mouth daily.   topiramate 100 MG tablet Commonly known as: Topamax Take 1 tablet (100 mg total) by mouth at bedtime.   Xolair 150 MG/ML prefilled syringe Generic drug: omalizumab Inject 150 mg into the skin every 30 (thirty) days. 2 injections   zolpidem 10 MG tablet Commonly known as: AMBIEN Take 10 mg by mouth at bedtime as needed for sleep.    Past Medical History:  Diagnosis Date   Anxiety disorder    Atypical squamous cell changes of undetermined significance (ASCUS) on vaginal cytology with positive high risk human papilloma virus (HPV) 09/11/2020   Formatting of this note might be different from the original. Many years ago before hyst cryo for abnormal pap 2021 ASCUS pap with pos HR HPV. Colpo-possible lesion- appears excoriated at the R cuff. Brushing shows inflammation with slightly positive  Ki67 index.10 days of metrogel. Recheck 3 weeks. Repeat pap in 6 mos. 03/2021 Nl pap with pos HR HPV   Bursitis, trochanteric 04/15/2013   Cardiac murmur 04/19/2021   Common migraine with intractable migraine 06/05/2021   Contracture of tendon sheath 11/26/2018   Depression with anxiety    Drug therapy 02/02/2018   Epistaxis 03/13/2016   Essential hypertension 04/19/2021   Fibromyalgia    G-6-PD deficiency 03/13/2016   GERD (gastroesophageal reflux disease)    Hyperemesis gravidarum 03/13/2016   Hyperlipidemia    Hypertension    Insomnia    Kidney stone    Laryngeal trauma 07/23/2012   Long term current use of opiate analgesic 03/12/2013   Migraine    Multiple joint pain 03/13/2016   Obesity (BMI 35.0-39.9 without comorbidity) 04/19/2021   Onychomycosis due to dermatophyte 11/03/2018   Osteoarthritis of left hip  03/12/2013   Ovarian cyst 03/13/2016   Pain due to onychomycosis of toenail of left foot 02/19/2018   Pain syndrome, chronic 03/12/2013   Peripheral polyneuropathy 02/02/2018   Pharyngitis 03/13/2016   Positive ANA (antinuclear antibody) 03/13/2016   Sacroiliac pain 03/12/2013   Seizure disorder (HCC) 11/13/2017   Syncope and collapse 01/01/2016   Thoracic spondylosis 03/12/2013   Thyroid disease    Urticaria    Vaginal bleeding 05/20/2019   Formatting of this note might be different from the original.  Prior hyst ~2005 for benign indications Bleeding 05/2019 Postcoital bleeding reported 08/2020   Voice hoarseness 02/02/2018    Past Surgical History:  Procedure Laterality Date   ABDOMINAL HYSTERECTOMY  APPENDECTOMY     CHOLECYSTECTOMY     ESOPHAGOGASTRODUODENOSCOPY  08/29/2017   Mild gastritis. Stauts post empiric esophageal dilitation.    TOE SURGERY Left    removed toe nail   TONSILLECTOMY AND ADENOIDECTOMY      Review of systems negative except as noted in HPI / PMHx or noted below:  Review of Systems  Constitutional: Negative.   HENT: Negative.    Eyes: Negative.   Respiratory: Negative.    Cardiovascular: Negative.   Gastrointestinal: Negative.   Genitourinary: Negative.   Musculoskeletal: Negative.   Skin: Negative.   Neurological: Negative.   Endo/Heme/Allergies: Negative.   Psychiatric/Behavioral: Negative.       Objective:   Vitals:   05/14/23 0837  BP: 128/82  Pulse: 88  Resp: 18  SpO2: 96%          Physical Exam Constitutional:      Appearance: She is not diaphoretic.  HENT:     Head: Normocephalic.     Right Ear: Tympanic membrane, ear canal and external ear normal.     Left Ear: Tympanic membrane, ear canal and external ear normal.     Nose: Nose normal. No mucosal edema or rhinorrhea.     Mouth/Throat:     Pharynx: Uvula midline. No oropharyngeal exudate.  Eyes:     Conjunctiva/sclera: Conjunctivae normal.  Neck:     Thyroid: No  thyromegaly.     Trachea: Trachea normal. No tracheal tenderness or tracheal deviation.  Cardiovascular:     Rate and Rhythm: Normal rate and regular rhythm.     Heart sounds: Normal heart sounds, S1 normal and S2 normal. No murmur heard. Pulmonary:     Effort: No respiratory distress.     Breath sounds: Normal breath sounds. No stridor. No wheezing or rales.  Lymphadenopathy:     Head:     Right side of head: No tonsillar adenopathy.     Left side of head: No tonsillar adenopathy.     Cervical: No cervical adenopathy.  Skin:    Findings: No erythema or rash.     Nails: There is no clubbing.  Neurological:     Mental Status: She is alert.     Diagnostics: Results of blood tests obtained 07 Apr 2023 identified tryptase 7.4 UG/L, negative alpha gal panel, thyroid peroxidase antibody 12 U/mL,  Assessment and Plan:   1. Anaphylaxis, subsequent encounter   2. Idiopathic urticaria     1.  Allergen avoidance measures - dust mite, tree pollen  2.  Prevent recurrent allergic reactions:   A. INCREASE Loratadine 10 mg - 1 tablet 2 times per day  B. INCREASE Famotidine 40 mg - 1 tablet 2 times per day  3. Epi-pen, benadryl, MD/ER evaluation for allergic reaction  4. Return to clinic in 12 weeks or earlier if problem  Alexxa still has some immunological hyperreactivity and we will increase her H1 and H2 receptor dose as noted above.  Will see what happens over the course of the next 12 weeks.  Laurette Schimke, MD Allergy / Immunology [facility]

## 2023-05-14 NOTE — Patient Instructions (Addendum)
  1.  Allergen avoidance measures - dust mite, tree pollen  2.  Prevent recurrent allergic reactions:   A. INCREASE Loratadine 10 mg - 1 tablet 2 times per day  B. INCREASE Famotidine 40 mg - 1 tablet 2 times per day  3. Epi-pen, benadryl, MD/ER evaluation for allergic reaction  4. Return to clinic in 12 weeks or earlier if problem

## 2023-05-15 ENCOUNTER — Ambulatory Visit (HOSPITAL_COMMUNITY): Payer: Medicare Other

## 2023-05-15 ENCOUNTER — Encounter: Payer: Self-pay | Admitting: Allergy and Immunology

## 2023-05-15 ENCOUNTER — Ambulatory Visit (HOSPITAL_COMMUNITY)
Admission: RE | Admit: 2023-05-15 | Discharge: 2023-05-15 | Disposition: A | Discharge status: Home / Self Care | Payer: Medicare Other | Source: Ambulatory Visit | Attending: Gastroenterology | Admitting: Gastroenterology

## 2023-05-15 DIAGNOSIS — R109 Unspecified abdominal pain: Secondary | ICD-10-CM | POA: Diagnosis present

## 2023-05-15 MED ORDER — TECHNETIUM TC 99M SULFUR COLLOID
2.2000 | Freq: Once | INTRAVENOUS | Status: AC
  Administered 2023-05-15: 2.2 via ORAL

## 2023-05-19 ENCOUNTER — Other Ambulatory Visit: Payer: Self-pay

## 2023-05-19 DIAGNOSIS — K3184 Gastroparesis: Secondary | ICD-10-CM

## 2023-05-19 MED ORDER — METOCLOPRAMIDE HCL 10 MG PO TABS: 10.0000 mg | ORAL_TABLET | Freq: Two times a day (BID) | ORAL | 0 refills | Status: DC

## 2023-05-26 ENCOUNTER — Telehealth: Payer: Self-pay | Admitting: Gastroenterology

## 2023-05-26 NOTE — Telephone Encounter (Signed)
Patient is calling states she is experiencing itchiness and it has developed into water blisters and she is concerned it is a reaction to her new medication. Please advise

## 2023-05-26 NOTE — Telephone Encounter (Signed)
Pt stated that she started experiencing itching the same day that she started taking the Reglan and several days later she has started getting Blisters. Pt stated that nothing has changed; no new meds, no laundry detergents, only the Reglan is new. Pt stated that the itching and blisters have not gotten any better only worse. Pt was notified to stop taking the medication and will see what Dr. Chales Abrahams recommends for next steps: Please advise

## 2023-05-30 NOTE — Telephone Encounter (Signed)
Doubt if it is because of Reglan.  Still, good idea to stop it. Did she ever try Motegrity 2 mg p.o. daily.  If not, get her some samples RG

## 2023-06-02 NOTE — Telephone Encounter (Signed)
Pt made aware of Dr. Chales Abrahams recommendations: Samples were gathered for pt Motegrity 2 mg p.o. daily. and placed on 2nd floor to be picked up: Pt made aware.  Pt notified to call our office with a symptom update after completing 2 weeks of samples. Pt verbalized understanding with all questions answered.

## 2023-06-02 NOTE — Telephone Encounter (Signed)
 Left message for pt to call back  °

## 2023-07-14 DIAGNOSIS — Z8639 Personal history of other endocrine, nutritional and metabolic disease: Secondary | ICD-10-CM | POA: Insufficient documentation

## 2023-07-14 DIAGNOSIS — Z889 Allergy status to unspecified drugs, medicaments and biological substances status: Secondary | ICD-10-CM | POA: Insufficient documentation

## 2023-07-14 HISTORY — DX: Personal history of other endocrine, nutritional and metabolic disease: Z86.39

## 2023-07-14 HISTORY — DX: Allergy status to unspecified drugs, medicaments and biological substances: Z88.9

## 2023-07-15 ENCOUNTER — Telehealth: Payer: Self-pay

## 2023-07-15 ENCOUNTER — Ambulatory Visit: Payer: Medicare Other | Admitting: Gastroenterology

## 2023-07-15 DIAGNOSIS — Z7409 Other reduced mobility: Secondary | ICD-10-CM | POA: Insufficient documentation

## 2023-07-15 HISTORY — DX: Other reduced mobility: Z74.09

## 2023-07-15 NOTE — Telephone Encounter (Signed)
Good Morning Dr. Chales Abrahams,   Patients daughter called stating that patient will not be able to make her appointment for today at 10:40 due to being in the hospital.  Patient was rescheduled for 11/13 at 10:40

## 2023-07-16 ENCOUNTER — Other Ambulatory Visit: Payer: Self-pay

## 2023-07-16 DIAGNOSIS — L509 Urticaria, unspecified: Secondary | ICD-10-CM | POA: Insufficient documentation

## 2023-07-18 ENCOUNTER — Encounter: Payer: Self-pay | Admitting: Cardiology

## 2023-07-18 ENCOUNTER — Ambulatory Visit: Payer: Medicare Other | Attending: Cardiology | Admitting: Cardiology

## 2023-07-18 VITALS — BP 110/70 | HR 75 | Ht 65.0 in | Wt 230.0 lb

## 2023-07-18 DIAGNOSIS — I10 Essential (primary) hypertension: Secondary | ICD-10-CM | POA: Insufficient documentation

## 2023-07-18 NOTE — Progress Notes (Signed)
Cardiology Office Note:    Date:  07/18/2023   ID:  Kelly Haas, DOB 1971-09-12, MRN [id number]  PCP:  Simone Curia, MD  Cardiologist:  Garwin Brothers, MD   Referring MD: Simone Curia, MD    ASSESSMENT:    1. Essential hypertension    PLAN:    In order of problems listed above:  Primary prevention stressed with the patient.  Importance of compliance with diet medication stressed and patient verbalized standing. Essential hypertension: Blood pressure stable and diet was emphasized. Recent seizure-like activity with weakness on left side of the body: This is followed by neurology.  Patient is in close monitor.  Neurology and also is getting therapy for the same.  She and her daughter had multiple questions which were answered to their satisfaction. Patient will be seen in follow-up appointment in 12 months or earlier if the patient has any concerns.    Medication Adjustments/Labs and Tests Ordered: Current medicines are reviewed at length with the patient today.  Concerns regarding medicines are outlined above.  Orders Placed This Encounter  Procedures   EKG 12-Lead   No orders of the defined types were placed in this encounter.    Chief Complaint  Patient presents with   Annual Exam     History of Present Illness:    Kelly Haas is a 52 y.o. female.  Patient has past medical history of essential hypertension.  Recently she had a seizure and was evaluated and discharged from the hospital.  I reviewed those records.  Neurology has been following her.  Since then she has developed left-sided weakness.  There is no documented stroke.  She is brought in by her daughter in a wheelchair.  Past Medical History:  Diagnosis Date   Abnormal cortisol level 05/01/2023   Abnormal thyroid function test 05/01/2023   Anxiety disorder    Atypical squamous cell changes of undetermined significance (ASCUS) on vaginal cytology with positive high risk human papilloma virus (HPV)  09/11/2020   Formatting of this note might be different from the original. Many years ago before hyst cryo for abnormal pap 2021 ASCUS pap with pos HR HPV. Colpo-possible lesion- appears excoriated at the R cuff. Brushing shows inflammation with slightly positive  Ki67 index.10 days of metrogel. Recheck 3 weeks. Repeat pap in 6 mos. 03/2021 Nl pap with pos HR HPV   Bursitis, trochanteric 04/15/2013   Cardiac murmur 04/19/2021   Common migraine with intractable migraine 06/05/2021   Contracture of tendon sheath 11/26/2018   Depression with anxiety    Drug therapy 02/02/2018   Epistaxis 03/13/2016   Essential hypertension 04/19/2021   Fibromyalgia    G-6-PD deficiency 03/13/2016   GERD (gastroesophageal reflux disease)    History of adrenal insufficiency 07/14/2023   History of underactive thyroid 08/27/2022   Hyperemesis gravidarum 03/13/2016   Hyperlipidemia    Hypertension    Insomnia    Kidney stone    Laryngeal trauma 07/23/2012   Long term current use of opiate analgesic 03/12/2013   Migraine    Multiple allergies 07/14/2023   Multiple joint pain 03/13/2016   Nontoxic multinodular goiter 08/27/2022   Obesity (BMI 35.0-39.9 without comorbidity) 04/19/2021   Onychomycosis due to dermatophyte 11/03/2018   Osteoarthritis of left hip 03/12/2013   Other reduced mobility 07/15/2023   Ovarian cyst 03/13/2016   Pain due to onychomycosis of toenail of left foot 02/19/2018   Pain syndrome, chronic 03/12/2013   Peripheral polyneuropathy 02/02/2018   Pharyngitis 03/13/2016  Positive ANA (antinuclear antibody) 03/13/2016   Sacroiliac pain 03/12/2013   Secondary adrenal insufficiency (HCC) 05/01/2023   Seizure disorder (HCC) 11/13/2017   Syncope and collapse 01/01/2016   Thoracic spondylosis 03/12/2013   Thyroid disease    Urticaria    Vaginal bleeding 05/20/2019   Formatting of this note might be different from the original.  Prior hyst ~2005 for benign indications Bleeding 05/2019  Postcoital bleeding reported 08/2020   Vitamin D insufficiency 05/01/2023   Voice hoarseness 02/02/2018    Past Surgical History:  Procedure Laterality Date   ABDOMINAL HYSTERECTOMY     APPENDECTOMY     CHOLECYSTECTOMY     ESOPHAGOGASTRODUODENOSCOPY  08/29/2017   Mild gastritis. Stauts post empiric esophageal dilitation.    TOE SURGERY Left    removed toe nail   TONSILLECTOMY AND ADENOIDECTOMY      Current Medications: Current Meds  Medication Sig   amLODipine (NORVASC) 5 MG tablet Take 5 mg by mouth daily.    busPIRone (BUSPAR) 30 MG tablet Take 30 mg by mouth 2 (two) times daily.   clonazePAM (KLONOPIN) 0.5 MG tablet Take 0.5 mg by mouth 2 (two) times daily as needed for anxiety.   cyclobenzaprine (FLEXERIL) 10 MG tablet Take 1 tablet (10 mg total) by mouth 3 (three) times daily as needed for Muscle spasms.   dicyclomine (BENTYL) 10 MG capsule Take 1 capsule (10 mg total) by mouth 4 (four) times daily as needed for spasms.   EPINEPHrine 0.3 mg/0.3 mL IJ SOAJ injection Inject 0.3 mg into the muscle as needed for anaphylaxis.   escitalopram (LEXAPRO) 10 MG tablet Take 10 mg by mouth daily.   famotidine (PEPCID) 40 MG tablet Take 1 tablet (40 mg total) by mouth daily.   furosemide (LASIX) 20 MG tablet Take 20 mg by mouth daily.   LamoTRIgine 200 MG TB24 24 hour tablet Take 2 tablets (400 mg total) by mouth daily.   loratadine (CLARITIN) 10 MG tablet Take 10 mg by mouth daily.   LYRICA 50 MG capsule Take 1 capsule by mouth 2 (two) times daily.    meclizine (ANTIVERT) 25 MG tablet Take 25 mg by mouth as needed for dizziness or nausea.   metoCLOPramide (REGLAN) 10 MG tablet Take 1 tablet (10 mg total) by mouth 2 (two) times daily.   nitroGLYCERIN (NITROSTAT) 0.4 MG SL tablet Place 0.4 mg under the tongue every 5 (five) minutes as needed for chest pain.   ondansetron (ZOFRAN-ODT) 8 MG disintegrating tablet Take 1 tablet (8 mg total) by mouth every 8 (eight) hours as needed for nausea or  vomiting.   pantoprazole (PROTONIX) 40 MG tablet Take 1 tablet (40 mg total) by mouth daily.   topiramate (TOPAMAX) 100 MG tablet Take 100 mg by mouth daily.   XOLAIR 150 MG/ML prefilled syringe Inject 150 mg into the skin every 30 (thirty) days. 2 injections   zolpidem (AMBIEN) 10 MG tablet Take 10 mg by mouth at bedtime as needed for sleep.     Allergies:   Bee venom, Iodinated contrast media, Ibuprofen, Acetaminophen, Aminolevulinate derivatives, Aspirin, Aspirin-acetaminophen-caffeine, Dapsone, Dimercaprol, Methylcellulose, Nitrofuran derivatives, Phenazopyridine, Primaquine, Toluidine blue, Celebrex [celecoxib], Latex, Other, Oxycodone, and Phenergan [promethazine hcl]   Social History   Socioeconomic History   Marital status: Single    Spouse name: Not on file   Number of children: 3   Years of education: 14   Highest education level: Associate degree: occupational, Scientist, product/process development, or vocational program  Occupational History   Occupation: Disabled  Tobacco Use   Smoking status: Never   Smokeless tobacco: Never  Vaping Use   Vaping status: Never Used  Substance and Sexual Activity   Alcohol use: No   Drug use: No   Sexual activity: Not on file  Other Topics Concern   Not on file  Social History Narrative   Lives at home with daughter, son and grandson.   Right-handed.   No caffeine use.   Social Determinants of Health   Financial Resource Strain: Not on file  Food Insecurity: Low Risk  (07/15/2023)   Received from [facility]   Food vital sign    Within the past 12 months, you worried that your food would run out before you got money to buy more: Never true    Within the past 12 months, the food you bought just didn't last and you didn't have money to get more. : Never true  Transportation Needs: No Transportation Needs (07/15/2023)   Received from [facility]    In the past 12 months, has lack of reliable transportation kept you from medical  appointments, meetings, work or from getting things needed for daily living? : No  Physical Activity: Not on file  Stress: Not on file  Social Connections: Unknown (03/29/2022)   Received from [facility]   Social Network    Social Network: Not on file     Family History: The patient's family history includes Cancer in her mother; Colon cancer in her maternal grandfather; Diabetes in her mother; Heart attack in her father and maternal grandmother; Hypertension in her maternal grandfather, maternal grandmother, mother, paternal grandfather, and paternal grandmother; Kidney cancer in her mother; Lung cancer in her maternal grandfather and maternal grandmother; Pancreatic cancer in her maternal grandfather; Prostate cancer in her maternal grandfather; Seizures in her father; Stroke in her maternal grandmother; Thyroid cancer in her maternal aunt and sister. There is no history of Esophageal cancer, Stomach cancer, or Rectal cancer.  ROS:   Please see the history of present illness.    All other systems reviewed and are negative.  EKGs/Labs/Other Studies Reviewed:    The following studies were reviewed today: I discussed my findings with the patient at length.   Recent Labs: No results found for requested labs within last 365 days.  Recent Lipid Panel No results found for: "CHOL", "TRIG", "HDL", "CHOLHDL", "VLDL", "LDLCALC", "LDLDIRECT"  Physical Exam:    VS:  BP 110/70 (BP Location: Right Arm, Patient Position: Sitting, Cuff Size: Normal)   Pulse 75   Ht 5\' 5"  (1.651 m)   Wt 230 lb (104.3 kg) Comment: Verbal  SpO2 92%   BMI 38.27 kg/m     Wt Readings from Last 3 Encounters:  07/18/23 230 lb (104.3 kg)  04/07/23 234 lb 6.4 oz (106.3 kg)  01/06/23 230 lb (104.3 kg)     GEN: Patient is in no acute distress HEENT: Normal NECK: No JVD; No carotid bruits LYMPHATICS: No lymphadenopathy CARDIAC: Hear sounds regular, 2/6 systolic murmur at the apex. RESPIRATORY:   Clear to auscultation without rales, wheezing or rhonchi  ABDOMEN: Soft, non-tender, non-distended MUSCULOSKELETAL:  No edema; No deformity  SKIN: Warm and dry NEUROLOGIC:  Alert and oriented x 3 PSYCHIATRIC:  Normal affect   Signed, Garwin Brothers, MD  07/18/2023 10:42 AM    [facility]

## 2023-07-18 NOTE — Patient Instructions (Signed)

## 2023-07-23 ENCOUNTER — Ambulatory Visit: Payer: Medicare Other | Admitting: Neurology

## 2023-07-27 ENCOUNTER — Other Ambulatory Visit: Payer: Self-pay | Admitting: Gastroenterology

## 2023-07-28 ENCOUNTER — Other Ambulatory Visit: Payer: Self-pay | Admitting: Gastroenterology

## 2023-07-28 DIAGNOSIS — K219 Gastro-esophageal reflux disease without esophagitis: Secondary | ICD-10-CM

## 2023-08-06 ENCOUNTER — Ambulatory Visit: Payer: Medicare Other | Admitting: Allergy and Immunology

## 2023-09-03 ENCOUNTER — Ambulatory Visit (INDEPENDENT_AMBULATORY_CARE_PROVIDER_SITE_OTHER): Payer: Medicare Other | Admitting: Neurology

## 2023-09-03 ENCOUNTER — Encounter: Payer: Self-pay | Admitting: Neurology

## 2023-09-03 VITALS — BP 134/83 | HR 84 | Ht 65.0 in | Wt 224.0 lb

## 2023-09-03 DIAGNOSIS — G40909 Epilepsy, unspecified, not intractable, without status epilepticus: Secondary | ICD-10-CM | POA: Diagnosis not present

## 2023-09-03 DIAGNOSIS — F419 Anxiety disorder, unspecified: Secondary | ICD-10-CM

## 2023-09-03 DIAGNOSIS — G43709 Chronic migraine without aura, not intractable, without status migrainosus: Secondary | ICD-10-CM | POA: Diagnosis not present

## 2023-09-03 DIAGNOSIS — F32A Depression, unspecified: Secondary | ICD-10-CM | POA: Diagnosis not present

## 2023-09-03 MED ORDER — TOPIRAMATE 100 MG PO TABS: 150.0000 mg | ORAL_TABLET | Freq: Every evening | ORAL | 3 refills | Status: AC
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
## 2023-09-03 NOTE — Patient Instructions (Signed)
Continue current medications Continue to follow-up with your doctor including psychiatrist and therapist Follow-up in 6 months or sooner if worse.

## 2023-10-01 ENCOUNTER — Encounter: Payer: Self-pay | Admitting: Gastroenterology

## 2023-10-01 ENCOUNTER — Ambulatory Visit (INDEPENDENT_AMBULATORY_CARE_PROVIDER_SITE_OTHER): Payer: Medicare Other | Admitting: Gastroenterology

## 2023-10-01 VITALS — BP 110/78 | HR 100 | Ht 65.0 in | Wt 226.2 lb

## 2023-10-01 DIAGNOSIS — R1013 Epigastric pain: Secondary | ICD-10-CM

## 2023-10-01 DIAGNOSIS — K219 Gastro-esophageal reflux disease without esophagitis: Secondary | ICD-10-CM | POA: Diagnosis not present

## 2023-10-01 DIAGNOSIS — K581 Irritable bowel syndrome with constipation: Secondary | ICD-10-CM | POA: Diagnosis not present

## 2023-10-01 DIAGNOSIS — K3184 Gastroparesis: Secondary | ICD-10-CM | POA: Diagnosis not present

## 2023-10-01 NOTE — Patient Instructions (Signed)
_______________________________________________________  If your blood pressure at your visit was 140/90 or greater, please contact your primary care physician to follow up on this.  _______________________________________________________  If you are age 51 or older, your body mass index should be between 23-30. Your Body mass index is 37.65 kg/m. If this is out of the aforementioned range listed, please consider follow up with your Primary Care Provider.  If you are age 27 or younger, your body mass index should be between 19-25. Your Body mass index is 37.65 kg/m. If this is out of the aformentioned range listed, please consider follow up with your Primary Care Provider.   ________________________________________________________  The [facility] providers would like to encourage you to use [facility] to communicate with providers for non-urgent requests or questions.  Due to long hold times on the telephone, sending your provider a message by [facility] may be a faster and more efficient way to get a response.  Please allow 48 business hours for a response.  Please remember that this is for non-urgent requests.  _______________________________________________________  We have given you samples of the following medication to take: Linzess daily  Drink plenty of water and start walking  Please follow up in 6 months. Give [place] a call at [phone] to schedule an appointment.  Thank you,  Dr. Lynann Bologna

## 2023-10-01 NOTE — Progress Notes (Signed)
Chief Complaint: Abdominal pain  Referring Provider:  Simone Curia, MD      ASSESSMENT AND PLAN;   #1. GERD with epi pain- neg EGD 10/2021. S/p lap chole in the past. Neg solid-phase GES 08/2016, neg CT abdo/pel 05/2018, neg CTA 03/12/2019.  Likely functional dyspepsia/abdominal wall pain.   #2.  IBS-C, neg colon 07/04/2017 except for small posterior anal fissure. Now with occ diarrhea.  #3. Mild gastroparesis (No DM).   Plan: - Protonix 40mg  po every day - Linzess 72 mcg po every day (samples) - Increase water intake. - Bentyl 10mg  po qid  prn. #120, 6 refills.  - Continue Zofran prn as prescribed by Dr. [name] - FU 6 months   HPI:    Kelly Haas is a 52 y.o. female  With history of migraine headaches, seizure disorder, G6PD deficiency, multinodular goiter, polyneuropathy, secondary adrenal insufficiency, multiple allergies, recurrent episodes of anaphylaxis, idiopathic urticaria   FU kidney stones  For follow-up visit [facility] Aug 2024  Nausea even when she smells something. Having N/V, somewhat better with zofran. Postprandial epi pain. Having dysphagia now with both solids and liquids. No heartburn. No particular food intolerence. No NSAIDs.  No constipation. Has been using 2 Gummies per day.  Denies having any significant constipation.  Having BMs 1/every other day.  No melena or hematochezia.  Negative CTA Abdo/pelvis 03/12/2019  Would like to have medication refill-Bentyl.  Wt Readings from Last 3 Encounters:  09/03/23 224 lb (101.6 kg)  07/18/23 230 lb (104.3 kg)  04/07/23 234 lb 6.4 oz (106.3 kg)   Stress eater.  Has gained weight   Past GI WU: EGD 10/25/2021 - Mild Gastritis.  - S/ P empiric esophageal dilatation 52Fr  Colon: Neg colon 07/04/2017 except for small posterior anal fissure. Rpt 10 yrs  NCCT 09/13/2023 There is a 3 mm ureterolithiasis at the distal LEFT ureter in the region of the LEFT UVJ with mild upstream LEFT hydroureteronephrosis.    GES 04/25/2023 15.1% emptied at 1 hr ( normal >= 10%) 39.1% emptied at 2 hr ( normal >= 40%) 49.3% emptied at 3 hr ( normal >= 70%) 80.3% emptied at 4 hr ( normal >= 90%) IMPRESSION: Delayed gastric emptying study.  Past Medical History:  Diagnosis Date   Abnormal cortisol level 05/01/2023   Abnormal thyroid function test 05/01/2023   Anxiety disorder    Atypical squamous cell changes of undetermined significance (ASCUS) on vaginal cytology with positive high risk human papilloma virus (HPV) 09/11/2020   Formatting of this note might be different from the original. Many years ago before hyst cryo for abnormal pap 2021 ASCUS pap with pos HR HPV. Colpo-possible lesion- appears excoriated at the R cuff. Brushing shows inflammation with slightly positive  Ki67 index.10 days of metrogel. Recheck 3 weeks. Repeat pap in 6 mos. 03/2021 Nl pap with pos HR HPV   Bursitis, trochanteric 04/15/2013   Cardiac murmur 04/19/2021   Common migraine with intractable migraine 06/05/2021   Contracture of tendon sheath 11/26/2018   Depression with anxiety    Drug therapy 02/02/2018   Epistaxis 03/13/2016   Essential hypertension 04/19/2021   Fibromyalgia    G-6-PD deficiency 03/13/2016   GERD (gastroesophageal reflux disease)    History of adrenal insufficiency 07/14/2023   History of underactive thyroid 08/27/2022   Hyperemesis gravidarum 03/13/2016   Hyperlipidemia    Hypertension    Insomnia    Kidney stone    Laryngeal trauma 07/23/2012   Long term current  use of opiate analgesic 03/12/2013   Migraine    Multiple allergies 07/14/2023   Multiple joint pain 03/13/2016   Nontoxic multinodular goiter 08/27/2022   Obesity (BMI 35.0-39.9 without comorbidity) 04/19/2021   Onychomycosis due to dermatophyte 11/03/2018   Osteoarthritis of left hip 03/12/2013   Other reduced mobility 07/15/2023   Ovarian cyst 03/13/2016   Pain due to onychomycosis of toenail of left foot 02/19/2018   Pain syndrome,  chronic 03/12/2013   Peripheral polyneuropathy 02/02/2018   Pharyngitis 03/13/2016   Positive ANA (antinuclear antibody) 03/13/2016   Sacroiliac pain 03/12/2013   Secondary adrenal insufficiency (HCC) 05/01/2023   Seizure disorder (HCC) 11/13/2017   Syncope and collapse 01/01/2016   Thoracic spondylosis 03/12/2013   Thyroid disease    Urticaria    Vaginal bleeding 05/20/2019   Formatting of this note might be different from the original.  Prior hyst ~2005 for benign indications Bleeding 05/2019 Postcoital bleeding reported 08/2020   Vitamin D insufficiency 05/01/2023   Voice hoarseness 02/02/2018    Past Surgical History:  Procedure Laterality Date   ABDOMINAL HYSTERECTOMY     APPENDECTOMY     CHOLECYSTECTOMY     ESOPHAGOGASTRODUODENOSCOPY  08/29/2017   Mild gastritis. Stauts post empiric esophageal dilitation.    TOE SURGERY Left    removed toe nail   TONSILLECTOMY AND ADENOIDECTOMY      Family History  Problem Relation Age of Onset   Cancer Mother    Diabetes Mother    Hypertension Mother    Kidney cancer Mother    Heart attack Father        60   Seizures Father    Thyroid cancer Sister        Patient reported   Thyroid cancer Maternal Aunt    Hypertension Maternal Grandmother    Lung cancer Maternal Grandmother    Stroke Maternal Grandmother    Heart attack Maternal Grandmother    Hypertension Maternal Grandfather    Colon cancer Maternal Grandfather    Pancreatic cancer Maternal Grandfather    Prostate cancer Maternal Grandfather    Lung cancer Maternal Grandfather    Hypertension Paternal Grandmother    Hypertension Paternal Grandfather    Esophageal cancer Neg Hx    Stomach cancer Neg Hx    Rectal cancer Neg Hx     Social History   Tobacco Use   Smoking status: Never   Smokeless tobacco: Never  Vaping Use   Vaping status: Never Used  Substance Use Topics   Alcohol use: No   Drug use: No    Current Outpatient Medications  Medication Sig  Dispense Refill   BELSOMRA 20 MG TABS Take 1 tablet by mouth at bedtime as needed.     busPIRone (BUSPAR) 30 MG tablet Take 30 mg by mouth 2 (two) times daily.     clonazePAM (KLONOPIN) 0.5 MG tablet Take 0.5 mg by mouth 2 (two) times daily as needed for anxiety.     cyclobenzaprine (FLEXERIL) 10 MG tablet Take 1 tablet (10 mg total) by mouth 3 (three) times daily as needed for Muscle spasms.  3   dicyclomine (BENTYL) 10 MG capsule Take 1 capsule (10 mg total) by mouth 4 times daily as needed for spasms. 120 capsule 2   EPINEPHrine 0.3 mg/0.3 mL IJ SOAJ injection Inject 0.3 mg into the muscle as needed for anaphylaxis.     famotidine (PEPCID) 40 MG tablet Take 1 tablet (40 mg total) by mouth daily. 30 tablet 5  FLUoxetine (PROZAC) 20 MG capsule Take 20 mg by mouth daily.     LamoTRIgine 200 MG TB24 24 hour tablet Take 2 tablets (400 mg total) by mouth daily. 180 tablet 3   loratadine (CLARITIN) 10 MG tablet Take 10 mg by mouth daily.     LYRICA 50 MG capsule Take 1 capsule by mouth 2 (two) times daily.   5   meclizine (ANTIVERT) 25 MG tablet Take 25 mg by mouth as needed for dizziness or nausea.     metoCLOPramide (REGLAN) 10 MG tablet Take 1 tablet (10 mg total) by mouth 2 (two) times daily. 28 tablet 0   ondansetron (ZOFRAN-ODT) 8 MG disintegrating tablet Take 1 tablet (8 mg total) by mouth every 8 (eight) hours as needed for nausea or vomiting. 20 tablet 0   pantoprazole (PROTONIX) 40 MG tablet TAKE ONE TABLET BY MOUTH DAILY 30 tablet 2   pregabalin (LYRICA) 75 MG capsule Take 75 mg by mouth 2 (two) times daily.     rizatriptan (MAXALT) 10 MG tablet Take 5 mg by mouth as needed.     tamsulosin (FLOMAX) 0.4 MG CAPS capsule Take 0.4 mg by mouth as needed.     topiramate (TOPAMAX) 100 MG tablet Take 1.5 tablets (150 mg total) by mouth at bedtime. 135 tablet 3   XOLAIR 150 MG/ML prefilled syringe Inject 150 mg into the skin every 30 (thirty) days. 2 injections     amLODipine (NORVASC) 5 MG  tablet Take 5 mg by mouth daily.  (Patient not taking: Reported on 09/03/2023)     furosemide (LASIX) 20 MG tablet Take 20 mg by mouth daily. (Patient not taking: Reported on 09/03/2023)     nitroGLYCERIN (NITROSTAT) 0.4 MG SL tablet Place 0.4 mg under the tongue every 5 (five) minutes as needed for chest pain. (Patient not taking: Reported on 10/01/2023)     No current facility-administered medications for this visit.    Allergies  Allergen Reactions   Bee Venom Anaphylaxis   Iodinated Contrast Media Anaphylaxis   Ibuprofen Hives, Nausea And Vomiting and Rash    Other reaction(s): Vomiting (intolerance)   Acetaminophen     Pass out   Aminolevulinate Derivatives     unknown   Aspirin Nausea And Vomiting   Aspirin-Acetaminophen-Caffeine     unknown   Dapsone     unknown   Dimercaprol     unknown   Methylcellulose     Pass out    Naproxen    Nitrofuran Derivatives     unknown   Phenazopyridine     unknown   Primaquine     unknown   Sulfa Antibiotics    Toluidine Blue     unknown   Celebrex [Celecoxib] Hives and Rash   Latex Rash   Other Rash and Other (See Comments)    Artichoke- rash around lips, ticklish throat   Oxycodone Rash   Phenergan [Promethazine Hcl] Rash    Review of Systems:  Reviewed     Physical Exam:    Today's Vitals   10/01/23 1118  BP: 110/78  Pulse: 100  Weight: 226 lb 4 oz (102.6 kg)  Height: 5\' 5"  (1.651 m)   Body mass index is 37.65 kg/m.; Gen: awake, alert, NAD HEENT: anicteric, no pallor CV: RRR, no mrg Pulm: CTA b/l Abd: soft, NT/ND, +BS throughout Ext: no c/c/e Neuro: nonfocal   Data Reviewed: I have personally reviewed following labs and imaging studies  CBC:    Latest Ref Rng &  Units 02/04/2022    1:04 PM 12/12/2021   10:22 AM 11/01/2021   10:11 AM  CBC  WBC 4.0 - 10.5 K/uL 6.7  6.5  6.8   Hemoglobin 12.0 - 15.0 g/dL 30.8  65.7  84.6   Hematocrit 36.0 - 46.0 % 40.3  37.3  43.0   Platelets 150 - 400 K/uL 335  283   354     CMP:    Latest Ref Rng & Units 02/04/2022    1:04 PM 12/12/2021   10:22 AM 11/01/2021   10:11 AM  CMP  Glucose 70 - 99 mg/dL 962  952  841   BUN 6 - 20 mg/dL 8  5  8    Creatinine 0.44 - 1.00 mg/dL 3.24  4.01  0.27   Sodium 135 - 145 mmol/L 140  140  139   Potassium 3.5 - 5.1 mmol/L 4.2  3.7  3.9   Chloride 98 - 111 mmol/L 106  109  105   CO2 22 - 32 mmol/L 27  24  27    Calcium 8.9 - 10.3 mg/dL 9.3  8.5  9.4         Edman Circle, MD 10/01/2023, 11:17 AM  Cc: Simone Curia, MD

## 2023-10-07 ENCOUNTER — Other Ambulatory Visit: Payer: Self-pay

## 2023-10-07 DIAGNOSIS — K219 Gastro-esophageal reflux disease without esophagitis: Secondary | ICD-10-CM

## 2023-10-07 MED ORDER — DICYCLOMINE HCL 10 MG PO CAPS: 10.0000 mg | ORAL_CAPSULE | Freq: Four times a day (QID) | ORAL | 1 refills | Status: DC | PRN

## 2023-10-07 MED ORDER — PANTOPRAZOLE SODIUM 40 MG PO TBEC: 40.0000 mg | DELAYED_RELEASE_TABLET | Freq: Every day | ORAL | 0 refills | Status: DC

## 2023-10-07 NOTE — Addendum Note (Signed)
Addended by: Alberteen Sam E on: 10/07/2023 02:08 PM   Modules accepted: Orders

## 2023-10-29 ENCOUNTER — Other Ambulatory Visit: Payer: Self-pay | Admitting: Neurology

## 2023-12-22 ENCOUNTER — Other Ambulatory Visit: Payer: Self-pay | Admitting: Gastroenterology

## 2023-12-22 DIAGNOSIS — K219 Gastro-esophageal reflux disease without esophagitis: Secondary | ICD-10-CM

## 2024-01-14 ENCOUNTER — Encounter: Payer: Self-pay | Admitting: Neurology

## 2024-01-14 NOTE — Telephone Encounter (Signed)
 Her last event was 8/26. I will send her a letter

## 2024-03-16 ENCOUNTER — Encounter: Payer: Self-pay | Admitting: Neurology

## 2024-03-16 ENCOUNTER — Ambulatory Visit (INDEPENDENT_AMBULATORY_CARE_PROVIDER_SITE_OTHER): Payer: Medicare Other | Admitting: Neurology

## 2024-03-16 VITALS — BP 104/72 | HR 78 | Ht 65.0 in | Wt 238.0 lb

## 2024-03-16 DIAGNOSIS — G43709 Chronic migraine without aura, not intractable, without status migrainosus: Secondary | ICD-10-CM

## 2024-03-16 DIAGNOSIS — G40909 Epilepsy, unspecified, not intractable, without status epilepticus: Secondary | ICD-10-CM | POA: Diagnosis not present

## 2024-03-16 DIAGNOSIS — F32A Depression, unspecified: Secondary | ICD-10-CM | POA: Diagnosis not present

## 2024-03-16 NOTE — Patient Instructions (Signed)
 Continue lamotrigine  XR 400 mg daily Continue with topiramate  150 mg daily Continue to follow-up with your doctors Return in 6 months or sooner if worse.
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
## 2024-05-31 ENCOUNTER — Other Ambulatory Visit: Payer: Self-pay | Admitting: Gastroenterology

## 2024-06-18 ENCOUNTER — Telehealth: Payer: Self-pay | Admitting: Gastroenterology

## 2024-06-18 NOTE — Telephone Encounter (Signed)
 Patient called and stated that she is currently taking Dicyclomine  and was feeling like the medication is not working for her. Patient stated that she is still have abdominal pain. Patient is requesting a call back.Please advise.

## 2024-06-18 NOTE — Telephone Encounter (Signed)
 Pt stated that the has been having generalized  abdominal pain for 2 days. ( Stabbing)  Last BM yesterday. Normal consistency.  No other GI symptoms.  Chart reviewed: No recent office visit.  Pt to be scheduled to see Alan Coombs on 06/28/2024 at 1:30 PM. (7 day hold) Pt made aware. Pt verbalized understanding with all questions answered.

## 2024-06-28 ENCOUNTER — Ambulatory Visit (INDEPENDENT_AMBULATORY_CARE_PROVIDER_SITE_OTHER): Admitting: Physician Assistant

## 2024-06-28 ENCOUNTER — Encounter: Payer: Self-pay | Admitting: Physician Assistant

## 2024-06-28 ENCOUNTER — Ambulatory Visit (INDEPENDENT_AMBULATORY_CARE_PROVIDER_SITE_OTHER)
Admission: RE | Admit: 2024-06-28 | Discharge: 2024-06-28 | Disposition: A | Discharge status: Home / Self Care | Source: Ambulatory Visit | Attending: Physician Assistant | Admitting: Physician Assistant

## 2024-06-28 ENCOUNTER — Ambulatory Visit: Payer: Self-pay | Admitting: Physician Assistant

## 2024-06-28 ENCOUNTER — Other Ambulatory Visit (INDEPENDENT_AMBULATORY_CARE_PROVIDER_SITE_OTHER)

## 2024-06-28 VITALS — BP 104/64 | HR 75 | Ht 65.0 in | Wt 230.0 lb

## 2024-06-28 DIAGNOSIS — R1084 Generalized abdominal pain: Secondary | ICD-10-CM

## 2024-06-28 DIAGNOSIS — R935 Abnormal findings on diagnostic imaging of other abdominal regions, including retroperitoneum: Secondary | ICD-10-CM

## 2024-06-28 DIAGNOSIS — R1033 Periumbilical pain: Secondary | ICD-10-CM

## 2024-06-28 DIAGNOSIS — K581 Irritable bowel syndrome with constipation: Secondary | ICD-10-CM

## 2024-06-28 DIAGNOSIS — K3184 Gastroparesis: Secondary | ICD-10-CM | POA: Diagnosis not present

## 2024-06-28 DIAGNOSIS — K582 Mixed irritable bowel syndrome: Secondary | ICD-10-CM | POA: Diagnosis not present

## 2024-06-28 DIAGNOSIS — K219 Gastro-esophageal reflux disease without esophagitis: Secondary | ICD-10-CM

## 2024-06-28 DIAGNOSIS — Z9049 Acquired absence of other specified parts of digestive tract: Secondary | ICD-10-CM

## 2024-06-28 LAB — COMPREHENSIVE METABOLIC PANEL WITH GFR
ALT: 9 U/L (ref 0–35)
AST: 10 U/L (ref 0–37)
Albumin: 4.1 g/dL (ref 3.5–5.2)
Alkaline Phosphatase: 81 U/L (ref 39–117)
BUN: 9 mg/dL (ref 6–23)
CO2: 26 meq/L (ref 19–32)
Calcium: 9 mg/dL (ref 8.4–10.5)
Chloride: 111 meq/L (ref 96–112)
Creatinine, Ser: 0.96 mg/dL (ref 0.40–1.20)
GFR: 67.99 mL/min (ref >60.00)
Glucose, Bld: 106 mg/dL — ABNORMAL HIGH (ref 70–99)
Potassium: 3.9 meq/L (ref 3.5–5.1)
Sodium: 143 meq/L (ref 135–145)
Total Bilirubin: 0.4 mg/dL (ref 0.2–1.2)
Total Protein: 6.7 g/dL (ref 6.0–8.3)

## 2024-06-28 LAB — CBC WITH DIFFERENTIAL/PLATELET
Basophils Absolute: 0 K/uL (ref 0.0–0.1)
Basophils Relative: 0.5 % (ref 0.0–3.0)
Eosinophils Absolute: 0.1 K/uL (ref 0.0–0.7)
Eosinophils Relative: 1.8 % (ref 0.0–5.0)
HCT: 38.3 % (ref 36.0–46.0)
Hemoglobin: 12.6 g/dL (ref 12.0–15.0)
Lymphocytes Relative: 40.4 % (ref 12.0–46.0)
Lymphs Abs: 2.6 K/uL (ref 0.7–4.0)
MCHC: 32.8 g/dL (ref 30.0–36.0)
MCV: 93 fl (ref 78.0–100.0)
Monocytes Absolute: 0.3 K/uL (ref 0.1–1.0)
Monocytes Relative: 5.2 % (ref 3.0–12.0)
Neutro Abs: 3.4 K/uL (ref 1.4–7.7)
Neutrophils Relative %: 52.1 % (ref 43.0–77.0)
Platelets: 309 K/uL (ref 150.0–400.0)
RBC: 4.12 Mil/uL (ref 3.87–5.11)
RDW: 13 % (ref 11.5–15.5)
WBC: 6.5 K/uL (ref 4.0–10.5)

## 2024-06-28 LAB — C-REACTIVE PROTEIN: CRP: <1 mg/dL (ref 0.5–20.0)

## 2024-06-28 LAB — SEDIMENTATION RATE: Sed Rate: 4 mm/h (ref 0–30)

## 2024-06-28 MED ORDER — DICYCLOMINE HCL 20 MG PO TABS: 20.0000 mg | ORAL_TABLET | Freq: Three times a day (TID) | ORAL | 2 refills | Status: AC

## 2024-06-28 NOTE — Patient Instructions (Addendum)
 Your provider has requested that you go to the basement level for lab work before leaving today. Press B on the elevator. The lab is located at the first door on the left as you exit the elevator.  Your provider has requested that you have an abdominal x ray before leaving today. Please go to the basement floor to our Radiology department for the test.  You have been scheduled for an appointment with Alan Coombs PA-C on 08-02-24 at 3pm . Please arrive 10 minutes early for your appointment.   First do a trial off milk/lactose products if you use them.  Add fiber like benefiber or citracel once a day Increase activity Can do trial of IBGard which is over the counter for AB pain- Take 1-2 capsules once a day for maintence or twice a day during a flare Can send in an anti spasm medication, Bentyl , to take as needed    FODMAP stands for fermentable oligo-, di-, mono-saccharides and polyols (1). These are the scientific terms used to classify groups of carbs that are difficult for our body to digest and that are notorious for triggering digestive symptoms like bloating, gas, loose stools and stomach pain.   You can try low FODMAP diet  - start with eliminating just one column at a time that you feel may be a trigger for you. - the table at the very bottom contains foods that are low in FODMAPs   Sometimes trying to eliminate the FODMAP's from your diet is difficult or tricky, if you are stuggling with trying to do the elimination diet you can try an enzyme.  There is a food enzymes that you sprinkle in or on your food that helps break down the FODMAP. You can read more about the enzyme by going to this site: https://fodzyme.com/    Gastroparesis Gastroparesis is a condition in which food takes longer than normal to empty from the stomach.  This condition is also known as delayed gastric emptying. It is usually a long-term (chronic) condition.  What are the signs or symptoms? Symptoms  of this condition include: Feeling full after eating very little or a loss of appetite. Nausea, vomiting, or heartburn. Bloating of your abdomen. Inconsistent blood sugar (glucose) levels on blood tests. Unexplained weight loss. Acid from the stomach coming up into the esophagus (gastroesophageal reflux). Sudden tightening (spasm) of the stomach, which can be painful. Symptoms may come and go. Some people may not notice any symptoms.  What increases the risk? You are more likely to develop this condition if: You have certain disorders or diseases. These may include: An endocrine disorder. An eating disorder. Amyloidosis. Scleroderma. Parkinson's disease. Multiple sclerosis. Cancer or infection of the stomach or the vagus nerve. You have had surgery on your stomach or vagus nerve. You take certain medicines. You are female.  Things you can do: Please do small frequent meals like 4-6 meals a day.  Eat and drink liquids at separate times.  Avoid high fiber foods, cook your vegetables, avoid high fat food.  Suggest spreading protein throughout the day (greek yogurt, glucerna, soft meat, milk, eggs) Choose soft foods that you can mash with a fork When you are more symptomatic, change to pureed foods foods and liquids.  Consider reading Living well with Gastroparesis by [facility] Check out this link to a diet online https://my.GroupJournal.fr

## 2024-06-28 NOTE — Progress Notes (Signed)
 06/28/2024 Kelly Haas [id number] 1971-04-26  Referring provider: Jama Chow, MD Primary GI doctor: Dr. Charlanne  ASSESSMENT AND PLAN:  AB pain periumbilical/lower AB 2 episodes of constant periumblical pain several weeks around, AB pain, no radiation, had fever one of the episodes, no nausea, vomiting, change in bowel habits 07/04/2017 colonoscopy neg except for small posterior anal fissure.  Bentyl  did not help QID, stool has been normal per patient but she did trial of linzess  72 mcg that did not help  06/18/2024 CT APW Thick-walled versus poorly distended distal rectosigmoid colon. Correlate clinically.  -Get Sed rate, CRP, fecal calprotectin - KUB to evaluate stool burden, consider trying linzess  72 or trulance -Can do trial of IBGARD daily, continue dicyclomine  but will increase to 20 mg daily -FODMAP,  and lifestyle changes discussed - salon pas patches for possible MSK, consider trigger point injection -Pending labs and response to medication, can consider colonoscopy evaluation - consider pelvic floor PT, information given  GERD  Status post cholecystectomy  EGD 10/2021 unremarkable.  Negative CT 04/22/2019, neg CTA 03/12/2019 05/15/2023 GES delayed gastric emptying Likely functional dyspepsia/abdominal wall pain/gastroparesis.  On Protonix  40 mg daily   IBS-C, recently with occasional diarrhea 07/04/2017 colonoscopy neg except for small posterior anal fissure.  Not on Linzess  72 mcg and Bentyl  10 mg    Mild gastroparesis 05/15/2023 GES delayed gastric emptying Patient is not diabetic  Patient Care Team: Jama Chow, MD as PCP - General (Internal Medicine) Ziolkowska, Aldona, MD as Consulting Physician (Internal Medicine)  HISTORY OF PRESENT ILLNESS: 53 y.o. female with a past medical history of  of migraine headaches, seizure disorder, G6PD deficiency, multinodular goiter, polyneuropathy, secondary adrenal insufficiency, multiple allergies, recurrent episodes of  anaphylaxis, idiopathic urticaria  and others listed below presents for evaluation of AB pain.   Patient was last seen in the office 10/01/2023 by Dr. Charlanne for abdominal pain, GERD, IBS-D mild gastroparesis.  Discussed the use of AI scribe software for clinical note transcription with the patient, who gave verbal consent to proceed.  History of Present Illness   Kelly Haas is a 53 year old female with delayed gastric emptying who presents with severe abdominal pain.  She has been experiencing severe abdominal pain for the past few weeks, with two significant episodes occurring recently. The pain is constant and located periumbilically, and does not improve with medications. She attempted to manage the pain with dicyclomine  taken four times a day, but found it ineffective during these episodes. During the most recent episode, she also experienced a fever and sought care at [facility], where she received pain medication and a CT scan.  Her past medical history includes a normal EGD in 2022, a gastric emptying study showing delayed gastric emptying about a year ago, and a colonoscopy in 2018 that was normal except for a small rectal fissure. No nausea, vomiting, or changes in bowel habits associated with the pain. Her bowel movements occur every other day or every two days and are soft. No urinary issues.  She has a history of blisters on her arms, legs, and chest, for which a biopsy was performed by her dermatologist. There is no known family history of Crohn's disease, ulcerative colitis, or autoimmune diseases. She has been seen by a rheumatologist for monitoring due to levels suggestive of autoimmune activity, but no treatment has been initiated.  Her current medications include dicyclomine , which she takes four times a day, and she has tried Linzess  for bowel issues  without relief. She reports eating small amounts of food quickly and notes that her weight fluctuates slightly.      She  reports that she has never smoked. She has never used smokeless tobacco. She reports that she does not drink alcohol and does not use drugs.  RELEVANT GI HISTORY, IMAGING AND LABS: Results   LABS CBC: No anemia, no infection (06/18/2024) Lipase: Normal (06/18/2024)  RADIOLOGY CT abdomen: Poorly distended or thickened bowel segment (06/18/2024)  DIAGNOSTIC Gastric emptying study: Delayed gastric emptying (2024) Colonoscopy: Rectal mucosa fissure (2018)      CBC    Component Value Date/Time   WBC 6.7 02/04/2022 1304   RBC 4.40 02/04/2022 1304   HGB 13.1 02/04/2022 1304   HGB 13.9 12/07/2020 1315   HCT 40.3 02/04/2022 1304   HCT 41.6 12/07/2020 1315   PLT 335 02/04/2022 1304   PLT 363 12/07/2020 1315   MCV 91.6 02/04/2022 1304   MCV 92 12/07/2020 1315   MCH 29.8 02/04/2022 1304   MCHC 32.5 02/04/2022 1304   RDW 12.4 02/04/2022 1304   RDW 11.6 (L) 12/07/2020 1315   LYMPHSABS 2.8 02/04/2022 1304   LYMPHSABS 2.3 12/07/2020 1315   MONOABS 0.4 02/04/2022 1304   EOSABS 0.1 02/04/2022 1304   EOSABS 0.0 12/07/2020 1315   BASOSABS 0.0 02/04/2022 1304   BASOSABS 0.0 12/07/2020 1315   No results for input(s): HGB in the last 8760 hours.  CMP     Component Value Date/Time   NA 140 02/04/2022 1304   NA 143 12/07/2020 1315   K 4.2 02/04/2022 1304   CL 106 02/04/2022 1304   CO2 27 02/04/2022 1304   GLUCOSE 104 (H) 02/04/2022 1304   BUN 8 02/04/2022 1304   BUN 11 12/07/2020 1315   CREATININE 1.05 (H) 02/04/2022 1304   CALCIUM 9.3 02/04/2022 1304   PROT 7.0 10/25/2021 1111   PROT 6.8 12/07/2020 1315   ALBUMIN 4.0 10/25/2021 1111   ALBUMIN 4.0 12/07/2020 1315   AST 15 10/25/2021 1111   ALT 10 10/25/2021 1111   ALKPHOS 101 10/25/2021 1111   BILITOT 0.5 10/25/2021 1111   BILITOT 0.3 12/07/2020 1315   GFRNONAA >60 02/04/2022 1304   GFRAA 75 12/07/2020 1315      Latest Ref Rng & Units 10/25/2021   11:11 AM 12/07/2020    1:15 PM 08/03/2020   12:46 AM  Hepatic  Function  Total Protein 6.0 - 8.3 g/dL 7.0  6.8  7.1   Albumin 3.5 - 5.2 g/dL 4.0  4.0  3.8   AST 0 - 37 U/L 15  20  18    ALT 0 - 35 U/L 10  19  16    Alk Phosphatase 39 - 117 U/L 101  86  67   Total Bilirubin 0.2 - 1.2 mg/dL 0.5  0.3  0.5       Current Medications:    Current Outpatient Medications (Cardiovascular):    amLODipine (NORVASC) 5 MG tablet, Take 5 mg by mouth daily.   EPINEPHrine 0.3 mg/0.3 mL IJ SOAJ injection, Inject 0.3 mg into the muscle as needed for anaphylaxis.   nitroGLYCERIN  (NITROSTAT ) 0.4 MG SL tablet, Place 0.4 mg under the tongue every 5 (five) minutes as needed for chest pain.  Current Outpatient Medications (Respiratory):    loratadine (CLARITIN) 10 MG tablet, Take 10 mg by mouth daily.   XOLAIR 150 MG/ML prefilled syringe, Inject 150 mg into the skin every 30 (thirty) days. 2 injections  Current Outpatient Medications (Analgesics):  rizatriptan  (MAXALT ) 10 MG tablet, Take 5 mg by mouth as needed.   Current Outpatient Medications (Other):    dicyclomine  (BENTYL ) 20 MG tablet, Take 1 tablet (20 mg total) by mouth 4 (four) times daily -  before meals and at bedtime.   BELSOMRA 20 MG TABS, Take 1 tablet by mouth at bedtime as needed.   busPIRone (BUSPAR) 30 MG tablet, Take 30 mg by mouth 2 (two) times daily.   clonazePAM (KLONOPIN) 0.5 MG tablet, Take 0.5 mg by mouth 2 (two) times daily as needed for anxiety.   cyclobenzaprine (FLEXERIL) 10 MG tablet, Take 1 tablet (10 mg total) by mouth 3 (three) times daily as needed for Muscle spasms.   FLUoxetine (PROZAC) 20 MG capsule, Take 20 mg by mouth daily.   LamoTRIgine  200 MG TB24 24 hour tablet, TAKE 2 TABLETS (400 MG TOTAL) BY MOUTH DAILY.   LYRICA 50 MG capsule, Take 1 capsule by mouth 2 (two) times daily.    meclizine (ANTIVERT) 25 MG tablet, Take 25 mg by mouth as needed for dizziness or nausea.   ondansetron  (ZOFRAN -ODT) 8 MG disintegrating tablet, Take 1 tablet (8 mg total) by mouth every 8 (eight)  hours as needed for nausea or vomiting.   pantoprazole  (PROTONIX ) 40 MG tablet, TAKE 1 TABLET EVERY DAY   pregabalin (LYRICA) 75 MG capsule, Take 75 mg by mouth 2 (two) times daily. (Patient not taking: Reported on 06/28/2024)   tamsulosin (FLOMAX) 0.4 MG CAPS capsule, Take 0.4 mg by mouth as needed.   topiramate  (TOPAMAX ) 100 MG tablet, Take 1.5 tablets (150 mg total) by mouth at bedtime.  Medical History:  Past Medical History:  Diagnosis Date   Abnormal cortisol level 05/01/2023   Abnormal thyroid  function test 05/01/2023   Anxiety disorder    Atypical squamous cell changes of undetermined significance (ASCUS) on vaginal cytology with positive high risk human papilloma virus (HPV) 09/11/2020   Formatting of this note might be different from the original. Many years ago before hyst cryo for abnormal pap 2021 ASCUS pap with pos HR HPV. Colpo-possible lesion- appears excoriated at the R cuff. Brushing shows inflammation with slightly positive  Ki67 index.10 days of metrogel. Recheck 3 weeks. Repeat pap in 6 mos. 03/2021 Nl pap with pos HR HPV   Bursitis, trochanteric 04/15/2013   Cardiac murmur 04/19/2021   Common migraine with intractable migraine 06/05/2021   Contracture of tendon sheath 11/26/2018   Depression with anxiety    Drug therapy 02/02/2018   Epistaxis 03/13/2016   Essential hypertension 04/19/2021   Fibromyalgia    G-6-PD deficiency 03/13/2016   GERD (gastroesophageal reflux disease)    History of adrenal insufficiency 07/14/2023   History of underactive thyroid  08/27/2022   Hyperemesis gravidarum 03/13/2016   Hyperlipidemia    Hypertension    Insomnia    Kidney stone    Laryngeal trauma 07/23/2012   Long term current use of opiate analgesic 03/12/2013   Migraine    Multiple allergies 07/14/2023   Multiple joint pain 03/13/2016   Multiple myeloma (HCC)    Nontoxic multinodular goiter 08/27/2022   Obesity (BMI 35.0-39.9 without comorbidity) 04/19/2021   Onychomycosis  due to dermatophyte 11/03/2018   Osteoarthritis of left hip 03/12/2013   Other reduced mobility 07/15/2023   Ovarian cyst 03/13/2016   Pain due to onychomycosis of toenail of left foot 02/19/2018   Pain syndrome, chronic 03/12/2013   Peripheral polyneuropathy 02/02/2018   Pharyngitis 03/13/2016   Positive ANA (antinuclear antibody) 03/13/2016  Renal insufficiency    Sacroiliac pain 03/12/2013   Secondary adrenal insufficiency (HCC) 05/01/2023   Seizure disorder (HCC) 11/13/2017   Syncope and collapse 01/01/2016   Thoracic spondylosis 03/12/2013   Thyroid  disease    Urticaria    Vaginal bleeding 05/20/2019   Formatting of this note might be different from the original.  Prior hyst ~2005 for benign indications Bleeding 05/2019 Postcoital bleeding reported 08/2020   Vitamin D  insufficiency 05/01/2023   Voice hoarseness 02/02/2018   Allergies:  Allergies  Allergen Reactions   Bee Venom Anaphylaxis   Iodinated Contrast Media Anaphylaxis   Ibuprofen Hives, Nausea And Vomiting and Rash    Other reaction(s): Vomiting (intolerance)   Acetaminophen      Pass out   Aminolevulinate Derivatives     unknown   Aspirin Nausea And Vomiting   Aspirin-Acetaminophen -Caffeine     unknown   Dapsone     unknown   Dimercaprol     unknown   Methylcellulose     Pass out    Naproxen    Nitrofuran Derivatives     unknown   Phenazopyridine     unknown   Primaquine     unknown   Sulfa Antibiotics    Toluidine Blue     unknown   Celebrex [Celecoxib] Hives and Rash   Latex Rash   Other Rash and Other (See Comments)    Artichoke- rash around lips, ticklish throat   Oxycodone  Rash   Phenergan  [Promethazine  Hcl] Rash     Surgical History:  She  has a past surgical history that includes Abdominal hysterectomy; Tonsillectomy and adenoidectomy; Cholecystectomy; Appendectomy; Esophagogastroduodenoscopy (08/29/2017); and Toe Surgery (Left). Family History:  Her family history includes Cancer in  her mother; Colon cancer in her maternal grandfather; Diabetes in her mother; Heart attack in her father and maternal grandmother; Hypertension in her maternal grandfather, maternal grandmother, mother, paternal grandfather, and paternal grandmother; Kidney cancer in her mother; Lung cancer in her maternal grandfather and maternal grandmother; Pancreatic cancer in her maternal grandfather; Prostate cancer in her maternal grandfather; Seizures in her father; Stroke in her maternal grandmother; Thyroid  cancer in her maternal aunt and sister.  REVIEW OF SYSTEMS  : All other systems reviewed and negative except where noted in the History of Present Illness.  PHYSICAL EXAM: BP 104/64   Pulse 75   Ht 5' 5 (1.651 m)   Wt 230 lb (104.3 kg)   BMI 38.27 kg/m  Physical Exam   GENERAL APPEARANCE: Well nourished, in no apparent distress. HEENT: No cervical lymphadenopathy, unremarkable thyroid , sclerae anicteric, conjunctiva pink. RESPIRATORY: Respiratory effort normal, breath sounds equal bilaterally without rales, rhonchi, or wheezing. CARDIO: Regular rate and rhythm with no murmurs, rubs, or gallops, peripheral pulses intact. ABDOMEN: Soft, non-distended, active bowel sounds in all four quadrants, tenderness localized to the upper abdomen with a positive Carnett's sign, no rebound, no mass appreciated. RECTAL: Declines. MUSCULOSKELETAL: Full range of motion, normal gait, without edema. SKIN: Dry, intact without rashes or lesions. No jaundice. NEURO: Alert, oriented, no focal deficits. PSYCH: Cooperative, normal mood and affect.      Alan JONELLE Coombs, PA-C 3:16 PM

## 2024-06-29 MED ORDER — LINACLOTIDE 145 MCG PO CAPS: 145.0000 ug | ORAL_CAPSULE | Freq: Every day | ORAL | 3 refills | Status: AC

## 2024-08-02 ENCOUNTER — Ambulatory Visit: Admitting: Physician Assistant

## 2024-08-02 NOTE — Progress Notes (Deleted)
 08/02/2024 Kelly Haas [id number] [birth year]  Referring provider: Jama Chow, MD Primary GI doctor: Dr. Charlanne  ASSESSMENT AND PLAN:  AB pain periumbilical/lower AB 2 episodes of constant periumblical pain several weeks around, AB pain, no radiation, had fever one of the episodes, no nausea, vomiting, change in bowel habits 07/04/2017 colonoscopy neg except for small posterior anal fissure.  Bentyl  did not help QID, stool has been normal per patient but she did trial of linzess  72 mcg that did not help  06/18/2024 CT APW Thick-walled versus poorly distended distal rectosigmoid colon. Correlate clinically.  -Get Sed rate, CRP, fecal calprotectin - KUB to evaluate stool burden, consider trying linzess  72 or trulance -Can do trial of IBGARD daily, continue dicyclomine  but will increase to 20 mg daily -FODMAP,  and lifestyle changes discussed - salon pas patches for possible MSK, consider trigger point injection -Pending labs and response to medication, can consider colonoscopy evaluation - consider pelvic floor PT, information given  GERD  Status post cholecystectomy  EGD 10/2021 unremarkable.  Negative CT 04/22/2019, neg CTA 03/12/2019 05/15/2023 GES delayed gastric emptying Likely functional dyspepsia/abdominal wall pain/gastroparesis.  On Protonix  40 mg daily   IBS-C, recently with occasional diarrhea 07/04/2017 colonoscopy neg except for small posterior anal fissure.  Not on Linzess  72 mcg and Bentyl  10 mg    Mild gastroparesis 05/15/2023 GES delayed gastric emptying Patient is not diabetic  Patient Care Team: Jama Chow, MD as PCP - General (Internal Medicine) Ziolkowska, Aldona, MD as Consulting Physician (Internal Medicine)  HISTORY OF PRESENT ILLNESS: 53 y.o. female with a past medical history of  of migraine headaches, seizure disorder, G6PD deficiency, multinodular goiter, polyneuropathy, secondary adrenal insufficiency, multiple allergies, recurrent episodes of  anaphylaxis, idiopathic urticaria  and others listed below presents for evaluation of AB pain.   Patient was last seen in the office by myself 06/28/2024.  At that visit she had unremarkable CBC, kidney liver, CRP and sed rate.  KUB did show stool throughout the colon without obstruction.  Started on Linzess  after bowel purge.  Discussed the use of AI scribe software for clinical note transcription with the patient, who gave verbal consent to proceed.  History of Present Illness         She  reports that she has never smoked. She has never used smokeless tobacco. She reports that she does not drink alcohol and does not use drugs.  RELEVANT GI HISTORY, IMAGING AND LABS: Results          CBC    Component Value Date/Time   WBC 6.5 06/28/2024 1438   RBC 4.12 06/28/2024 1438   HGB 12.6 06/28/2024 1438   HGB 13.9 12/07/2020 1315   HCT 38.3 06/28/2024 1438   HCT 41.6 12/07/2020 1315   PLT 309.0 06/28/2024 1438   PLT 363 12/07/2020 1315   MCV 93.0 06/28/2024 1438   MCV 92 12/07/2020 1315   MCH 29.8 02/04/2022 1304   MCHC 32.8 06/28/2024 1438   RDW 13.0 06/28/2024 1438   RDW 11.6 (L) 12/07/2020 1315   LYMPHSABS 2.6 06/28/2024 1438   LYMPHSABS 2.3 12/07/2020 1315   MONOABS 0.3 06/28/2024 1438   EOSABS 0.1 06/28/2024 1438   EOSABS 0.0 12/07/2020 1315   BASOSABS 0.0 06/28/2024 1438   BASOSABS 0.0 12/07/2020 1315   Recent Labs    06/28/24 1438  HGB 12.6    CMP     Component Value Date/Time   NA 143 06/28/2024 1438   NA 143 12/07/2020  1315   K 3.9 06/28/2024 1438   CL 111 06/28/2024 1438   CO2 26 06/28/2024 1438   GLUCOSE 106 (H) 06/28/2024 1438   BUN 9 06/28/2024 1438   BUN 11 12/07/2020 1315   CREATININE 0.96 06/28/2024 1438   CALCIUM 9.0 06/28/2024 1438   PROT 6.7 06/28/2024 1438   PROT 6.8 12/07/2020 1315   ALBUMIN 4.1 06/28/2024 1438   ALBUMIN 4.0 12/07/2020 1315   AST 10 06/28/2024 1438   ALT 9 06/28/2024 1438   ALKPHOS 81 06/28/2024 1438   BILITOT 0.4  06/28/2024 1438   BILITOT 0.3 12/07/2020 1315   GFRNONAA >60 02/04/2022 1304   GFRAA 75 12/07/2020 1315      Latest Ref Rng & Units 06/28/2024    2:38 PM 10/25/2021   11:11 AM 12/07/2020    1:15 PM  Hepatic Function  Total Protein 6.0 - 8.3 g/dL 6.7  7.0  6.8   Albumin 3.5 - 5.2 g/dL 4.1  4.0  4.0   AST 0 - 37 U/L 10  15  20    ALT 0 - 35 U/L 9  10  19    Alk Phosphatase 39 - 117 U/L 81  101  86   Total Bilirubin 0.2 - 1.2 mg/dL 0.4  0.5  0.3       Current Medications:    Current Outpatient Medications (Cardiovascular):    amLODipine (NORVASC) 5 MG tablet, Take 5 mg by mouth daily.   EPINEPHrine 0.3 mg/0.3 mL IJ SOAJ injection, Inject 0.3 mg into the muscle as needed for anaphylaxis.   nitroGLYCERIN  (NITROSTAT ) 0.4 MG SL tablet, Place 0.4 mg under the tongue every 5 (five) minutes as needed for chest pain.  Current Outpatient Medications (Respiratory):    loratadine (CLARITIN) 10 MG tablet, Take 10 mg by mouth daily.   XOLAIR 150 MG/ML prefilled syringe, Inject 150 mg into the skin every 30 (thirty) days. 2 injections  Current Outpatient Medications (Analgesics):    rizatriptan  (MAXALT ) 10 MG tablet, Take 5 mg by mouth as needed.   Current Outpatient Medications (Other):    BELSOMRA 20 MG TABS, Take 1 tablet by mouth at bedtime as needed.   busPIRone (BUSPAR) 30 MG tablet, Take 30 mg by mouth 2 (two) times daily.   clonazePAM (KLONOPIN) 0.5 MG tablet, Take 0.5 mg by mouth 2 (two) times daily as needed for anxiety.   cyclobenzaprine (FLEXERIL) 10 MG tablet, Take 1 tablet (10 mg total) by mouth 3 (three) times daily as needed for Muscle spasms.   dicyclomine  (BENTYL ) 20 MG tablet, Take 1 tablet (20 mg total) by mouth 4 (four) times daily -  before meals and at bedtime.   FLUoxetine (PROZAC) 20 MG capsule, Take 20 mg by mouth daily.   LamoTRIgine  200 MG TB24 24 hour tablet, TAKE 2 TABLETS (400 MG TOTAL) BY MOUTH DAILY.   linaclotide  (LINZESS ) 145 MCG CAPS capsule, Take 1 capsule  (145 mcg total) by mouth daily before breakfast.   LYRICA 50 MG capsule, Take 1 capsule by mouth 2 (two) times daily.    meclizine (ANTIVERT) 25 MG tablet, Take 25 mg by mouth as needed for dizziness or nausea.   ondansetron  (ZOFRAN -ODT) 8 MG disintegrating tablet, Take 1 tablet (8 mg total) by mouth every 8 (eight) hours as needed for nausea or vomiting.   pantoprazole  (PROTONIX ) 40 MG tablet, TAKE 1 TABLET EVERY DAY   pregabalin (LYRICA) 75 MG capsule, Take 75 mg by mouth 2 (two) times daily. (Patient not taking:  Reported on 06/28/2024)   tamsulosin (FLOMAX) 0.4 MG CAPS capsule, Take 0.4 mg by mouth as needed.   topiramate  (TOPAMAX ) 100 MG tablet, Take 1.5 tablets (150 mg total) by mouth at bedtime.  Medical History:  Past Medical History:  Diagnosis Date   Abnormal cortisol level 05/01/2023   Abnormal thyroid  function test 05/01/2023   Anxiety disorder    Atypical squamous cell changes of undetermined significance (ASCUS) on vaginal cytology with positive high risk human papilloma virus (HPV) 09/11/2020   Formatting of this note might be different from the original. Many years ago before hyst cryo for abnormal pap 2021 ASCUS pap with pos HR HPV. Colpo-possible lesion- appears excoriated at the R cuff. Brushing shows inflammation with slightly positive  Ki67 index.10 days of metrogel. Recheck 3 weeks. Repeat pap in 6 mos. 03/2021 Nl pap with pos HR HPV   Bursitis, trochanteric 04/15/2013   Cardiac murmur 04/19/2021   Common migraine with intractable migraine 06/05/2021   Contracture of tendon sheath 11/26/2018   Depression with anxiety    Drug therapy 02/02/2018   Epistaxis 03/13/2016   Essential hypertension 04/19/2021   Fibromyalgia    G-6-PD deficiency 03/13/2016   GERD (gastroesophageal reflux disease)    History of adrenal insufficiency 07/14/2023   History of underactive thyroid  08/27/2022   Hyperemesis gravidarum 03/13/2016   Hyperlipidemia    Hypertension    Insomnia     Kidney stone    Laryngeal trauma 07/23/2012   Long term current use of opiate analgesic 03/12/2013   Migraine    Multiple allergies 07/14/2023   Multiple joint pain 03/13/2016   Multiple myeloma (HCC)    Nontoxic multinodular goiter 08/27/2022   Obesity (BMI 35.0-39.9 without comorbidity) 04/19/2021   Onychomycosis due to dermatophyte 11/03/2018   Osteoarthritis of left hip 03/12/2013   Other reduced mobility 07/15/2023   Ovarian cyst 03/13/2016   Pain due to onychomycosis of toenail of left foot 02/19/2018   Pain syndrome, chronic 03/12/2013   Peripheral polyneuropathy 02/02/2018   Pharyngitis 03/13/2016   Positive ANA (antinuclear antibody) 03/13/2016   Renal insufficiency    Sacroiliac pain 03/12/2013   Secondary adrenal insufficiency (HCC) 05/01/2023   Seizure disorder (HCC) 11/13/2017   Syncope and collapse 01/01/2016   Thoracic spondylosis 03/12/2013   Thyroid  disease    Urticaria    Vaginal bleeding 05/20/2019   Formatting of this note might be different from the original.  Prior hyst ~2005 for benign indications Bleeding 05/2019 Postcoital bleeding reported 08/2020   Vitamin D  insufficiency 05/01/2023   Voice hoarseness 02/02/2018   Allergies:  Allergies  Allergen Reactions   Bee Venom Anaphylaxis   Iodinated Contrast Media Anaphylaxis   Ibuprofen Hives, Nausea And Vomiting and Rash    Other reaction(s): Vomiting (intolerance)   Acetaminophen      Pass out   Aminolevulinate Derivatives     unknown   Aspirin Nausea And Vomiting   Aspirin-Acetaminophen -Caffeine     unknown   Dapsone     unknown   Dimercaprol     unknown   Methylcellulose     Pass out    Naproxen    Nitrofuran Derivatives     unknown   Phenazopyridine     unknown   Primaquine     unknown   Sulfa Antibiotics    Toluidine Blue     unknown   Celebrex [Celecoxib] Hives and Rash   Latex Rash   Other Rash and Other (See Comments)    Artichoke- rash around  lips, ticklish throat   Oxycodone   Rash   Phenergan  [Promethazine  Hcl] Rash     Surgical History:  She  has a past surgical history that includes Abdominal hysterectomy; Tonsillectomy and adenoidectomy; Cholecystectomy; Appendectomy; Esophagogastroduodenoscopy (08/29/2017); and Toe Surgery (Left). Family History:  Her family history includes Cancer in her mother; Colon cancer in her maternal grandfather; Diabetes in her mother; Heart attack in her father and maternal grandmother; Hypertension in her maternal grandfather, maternal grandmother, mother, paternal grandfather, and paternal grandmother; Kidney cancer in her mother; Lung cancer in her maternal grandfather and maternal grandmother; Pancreatic cancer in her maternal grandfather; Prostate cancer in her maternal grandfather; Seizures in her father; Stroke in her maternal grandmother; Thyroid  cancer in her maternal aunt and sister.  REVIEW OF SYSTEMS  : All other systems reviewed and negative except where noted in the History of Present Illness.  PHYSICAL EXAM: There were no vitals taken for this visit. Physical Exam          Alan JONELLE Coombs, PA-C 8:11 AM

## 2024-08-11 ENCOUNTER — Ambulatory Visit: Attending: Internal Medicine | Admitting: Physical Therapy

## 2024-08-11 VITALS — BP 120/82 | HR 78

## 2024-08-11 DIAGNOSIS — M79605 Pain in left leg: Secondary | ICD-10-CM | POA: Diagnosis present

## 2024-08-11 DIAGNOSIS — M6281 Muscle weakness (generalized): Secondary | ICD-10-CM | POA: Diagnosis present

## 2024-08-11 DIAGNOSIS — R2689 Other abnormalities of gait and mobility: Secondary | ICD-10-CM | POA: Diagnosis present

## 2024-08-11 DIAGNOSIS — R2681 Unsteadiness on feet: Secondary | ICD-10-CM | POA: Diagnosis present

## 2024-08-11 NOTE — Therapy (Signed)
 OUTPATIENT PHYSICAL THERAPY NEURO EVALUATION   Patient Name: Kelly Haas MRN: [id number] DOB:05-20-1971, 53 y.o., female Today's Date: 08/11/2024   PCP: Jama Chow, MD REFERRING PROVIDER: Jama Chow, MD  END OF SESSION:  PT End of Session - 08/11/24 1514     Visit Number 1    Number of Visits 13   with eval   Date for Recertification  10/06/24   to allow for scheduling delays   Authorization Type Medicare    PT Start Time 1512    PT Stop Time 1603    PT Time Calculation (min) 51 min    Equipment Utilized During Treatment Gait belt    Activity Tolerance Patient tolerated treatment well    Behavior During Therapy WFL for tasks assessed/performed          Past Medical History:  Diagnosis Date   Abnormal cortisol level 05/01/2023   Abnormal thyroid  function test 05/01/2023   Anxiety disorder    Atypical squamous cell changes of undetermined significance (ASCUS) on vaginal cytology with positive high risk human papilloma virus (HPV) 09/11/2020   Formatting of this note might be different from the original. Many years ago before hyst cryo for abnormal pap 2021 ASCUS pap with pos HR HPV. Colpo-possible lesion- appears excoriated at the R cuff. Brushing shows inflammation with slightly positive  Ki67 index.10 days of metrogel. Recheck 3 weeks. Repeat pap in 6 mos. 03/2021 Nl pap with pos HR HPV   Bursitis, trochanteric 04/15/2013   Cardiac murmur 04/19/2021   Common migraine with intractable migraine 06/05/2021   Contracture of tendon sheath 11/26/2018   Depression with anxiety    Drug therapy 02/02/2018   Epistaxis 03/13/2016   Essential hypertension 04/19/2021   Fibromyalgia    G-6-PD deficiency 03/13/2016   GERD (gastroesophageal reflux disease)    History of adrenal insufficiency 07/14/2023   History of underactive thyroid  08/27/2022   Hyperemesis gravidarum 03/13/2016   Hyperlipidemia    Hypertension    Insomnia    Kidney stone    Laryngeal trauma 07/23/2012    Long term current use of opiate analgesic 03/12/2013   Migraine    Multiple allergies 07/14/2023   Multiple joint pain 03/13/2016   Multiple myeloma (HCC)    Nontoxic multinodular goiter 08/27/2022   Obesity (BMI 35.0-39.9 without comorbidity) 04/19/2021   Onychomycosis due to dermatophyte 11/03/2018   Osteoarthritis of left hip 03/12/2013   Other reduced mobility 07/15/2023   Ovarian cyst 03/13/2016   Pain due to onychomycosis of toenail of left foot 02/19/2018   Pain syndrome, chronic 03/12/2013   Peripheral polyneuropathy 02/02/2018   Pharyngitis 03/13/2016   Positive ANA (antinuclear antibody) 03/13/2016   Renal insufficiency    Sacroiliac pain 03/12/2013   Secondary adrenal insufficiency 05/01/2023   Seizure disorder (HCC) 11/13/2017   Syncope and collapse 01/01/2016   Thoracic spondylosis 03/12/2013   Thyroid  disease    Urticaria    Vaginal bleeding 05/20/2019   Formatting of this note might be different from the original.  Prior hyst ~2005 for benign indications Bleeding 05/2019 Postcoital bleeding reported 08/2020   Vitamin D  insufficiency 05/01/2023   Voice hoarseness 02/02/2018   Past Surgical History:  Procedure Laterality Date   ABDOMINAL HYSTERECTOMY     APPENDECTOMY     CHOLECYSTECTOMY     ESOPHAGOGASTRODUODENOSCOPY  08/29/2017   Mild gastritis. Stauts post empiric esophageal dilitation.    TOE SURGERY Left    removed toe nail   TONSILLECTOMY AND ADENOIDECTOMY  Patient Active Problem List   Diagnosis Date Noted   Urticaria    Other reduced mobility 07/15/2023   History of adrenal insufficiency 07/14/2023   Multiple allergies 07/14/2023   Abnormal cortisol level 05/01/2023   Abnormal thyroid  function test 05/01/2023   Secondary adrenal insufficiency 05/01/2023   Vitamin D  insufficiency 05/01/2023   History of underactive thyroid  08/27/2022   Nontoxic multinodular goiter 08/27/2022   Anxiety disorder 06/26/2021   GERD (gastroesophageal reflux  disease) 06/26/2021   Hyperlipidemia 06/26/2021   Hypertension 06/26/2021   Common migraine with intractable migraine 06/05/2021   Depression with anxiety 06/01/2021   Fibromyalgia 06/01/2021   Kidney stone 06/01/2021   Migraine 06/01/2021   Thyroid  disease 06/01/2021   Essential hypertension 04/19/2021   Cardiac murmur 04/19/2021   Obesity (BMI 35.0-39.9 without comorbidity) 04/19/2021   Atypical squamous cell changes of undetermined significance (ASCUS) on vaginal cytology with positive high risk human papilloma virus (HPV) 09/11/2020   Vaginal bleeding 05/20/2019   Contracture of tendon sheath 11/26/2018   Onychomycosis due to dermatophyte 11/03/2018   Pain due to onychomycosis of toenail of left foot 02/19/2018   Drug therapy 02/02/2018   Peripheral polyneuropathy 02/02/2018   Voice hoarseness 02/02/2018   Seizure disorder (HCC) 11/13/2017   Epistaxis 03/13/2016   G-6-PD deficiency 03/13/2016   Hyperemesis gravidarum 03/13/2016   Insomnia 03/13/2016   Multiple joint pain 03/13/2016   Ovarian cyst 03/13/2016   Pharyngitis 03/13/2016   Positive ANA (antinuclear antibody) 03/13/2016   Syncope and collapse 01/01/2016   Bursitis, trochanteric 04/15/2013   Long term current use of opiate analgesic 03/12/2013   Osteoarthritis of left hip 03/12/2013   Pain syndrome, chronic 03/12/2013   Sacroiliac pain 03/12/2013   Thoracic spondylosis 03/12/2013   Laryngeal trauma 07/23/2012    ONSET DATE: 07/16/2024 (referral)   REFERRING DIAG: R53.1 (ICD-10-CM) - Weakness  THERAPY DIAG:  Muscle weakness (generalized)  Other abnormalities of gait and mobility  Unsteadiness on feet  Pain in left leg  Rationale for Evaluation and Treatment: Rehabilitation  SUBJECTIVE:                                                                                                                                                                                             SUBJECTIVE  STATEMENT: Kelly Haas  Pt states, I'm trying to get off my convertible. She states, I was on my all terrain (wheelchair), then progressed to my Caddy (rollator), and now I'm on my convertible [facility]). She reports that she used the wheelchair after hospitalization last August to December, from December to July she used her rollator, and she has been using  her [facility] since July. She did HHPT after d/c home from the hospital and transitioned to outpatient at [facility] (with [facility]) until they closed in July. She has been trying to get back into outpatient PT since then.  She reports that she was hospitalized last year following several seizures and coding several times. She does have a history of epilepsy but these seizures were different than what she normally experiences. Following all this she had weakness in her L side of her body. She reports that rarely she will drop things with her L hand and that sometimes her LLE will go limp mode and give out on her. She has tried progressing away from her [facility] but feels very wobbly.  She has been seeing Dr. Gregg at [facility] and had just been cleared to return to driving a few months ago. However, she did have a seizure about 3 weeks ago.  I'm pigeon-toed   Pt accompanied by: self, drove herself today (educated pt on driving protocol after seizures)  PERTINENT HISTORY: PMH: HTN, seizures, migraines, anxiety, depression, chronic pain syndrome, fibromyalgia, GERD, hypothyroidism, peptic ulcer disease, G6PD deficiency, multinodular goiter, polyneuropathy, secondary adrenal insufficiency, multiple allergies, recurrent episodes of anaphylaxis, idiopathic urticaria   PAIN:  Are you having pain? Yes: NPRS scale: 0/10 currently, gets up to 8/10 Pain location: L thigh Pain description: sharp Aggravating factors: N/A Relieving factors: hot shower  PRECAUTIONS: Fall  RED FLAGS: None   WEIGHT BEARING RESTRICTIONS: No  FALLS: Has patient fallen in  last 6 months? Yes. Number of falls frequently, unable to give further information   SEIZURES: Most recent one was 3 weeks ago, had a headache while driving and called her daughter who came to get her and she pulled over Lack of sleep and stress throw everything off  LIVING ENVIRONMENT: Lives with: lives alone Lives in: House/apartment Stairs: Yes: External: 2 steps; none; steps at back door, level entry at front door Has following equipment at home: Quad cane small base, Environmental consultant - 4 wheeled, Wheelchair (manual), shower chair, and bed side commode - BSC sits over her toilet  PLOF: Independent with basic ADLs, Independent with gait, Independent with transfers, and Requires assistive device for independence  PATIENT GOALS: to get off my convertible [facility]), I want to run-I know I can't run, run, I want this leg (left) to be able to hop, skip, and jump  OBJECTIVE:  Note: Objective measures were completed at Evaluation unless otherwise noted.  DIAGNOSTIC FINDINGS: None updated or relevant to this POC  COGNITION: Overall cognitive status: her family says she will repeat stuff, possible short term memory impairments   SENSATION: Gets numbness in LLE at times, no tingling Decreased light touch sensation in LLE, tingling in distal LLE  COORDINATION: Impaired LLE  POSTURE: forward head and hunched shoulders  LOWER EXTREMITY ROM:   WFL  Active  Right Eval Left Eval  Hip flexion    Hip extension    Hip abduction    Hip adduction    Hip internal rotation    Hip external rotation    Knee flexion    Knee extension    Ankle dorsiflexion    Ankle plantarflexion    Ankle inversion    Ankle eversion     (Blank rows = not tested)  LOWER EXTREMITY MMT:    MMT Right Eval Left Eval  Hip flexion 5 4+  Hip extension    Hip abduction    Hip adduction    Hip internal  rotation    Hip external rotation    Knee flexion 5 4  Knee extension 5 4  Ankle dorsiflexion 5 4  Ankle  plantarflexion    Ankle inversion    Ankle eversion    (Blank rows = not tested)  BED MOBILITY:  Mod I per pt report  TRANSFERS: Sit to stand: Modified independence  Assistive device utilized: None     Stand to sit: Modified independence  Assistive device utilized: None     Chair to chair: Modified independence  Assistive device utilized: None       RAMP:  Not tested  CURB:  Not tested  STAIRS: STAIRS:  Level of Assistance: CGA  Stair Negotiation Technique: Alternating Pattern  with Bilateral Rails  Number of Stairs: 4   Height of Stairs: 6  Comments: WFL   GAIT: Findings:  Gait pattern: decreased hip/knee flexion- Left and genu recurvatum- Left Distance walked: various clinic distances Assistive device utilized: Quad cane small base Level of assistance: Modified independence   FUNCTIONAL TESTS:    [facility] PT Assessment - 08/11/24 1545       Ambulation/Gait   Gait velocity 32.8 ft over 14.35 sec = 2.29 ft/sec   with [facility]     Standardized Balance Assessment   Standardized Balance Assessment Timed Up and Go Test;Five Times Sit to Stand    Five times sit to stand comments  19.81 sec, no UE      Timed Up and Go Test   TUG Normal TUG    Normal TUG (seconds) 12.2   with SBQC     Functional Gait  Assessment   Gait assessed  Yes    Gait Level Surface Walks 20 ft, slow speed, abnormal gait pattern, evidence for imbalance or deviates 10-15 in outside of the 12 in walkway width. Requires more than 7 sec to ambulate 20 ft.    Change in Gait Speed Able to change speed, demonstrates mild gait deviations, deviates 6-10 in outside of the 12 in walkway width, or no gait deviations, unable to achieve a major change in velocity, or uses a change in velocity, or uses an assistive device.    Gait with Horizontal Head Turns Performs head turns smoothly with slight change in gait velocity (eg, minor disruption to smooth gait path), deviates 6-10 in outside 12 in walkway width, or uses  an assistive device.    Gait with Vertical Head Turns Performs task with slight change in gait velocity (eg, minor disruption to smooth gait path), deviates 6 - 10 in outside 12 in walkway width or uses assistive device    Gait and Pivot Turn Turns slowly, requires verbal cueing, or requires several small steps to catch balance following turn and stop    Step Over Obstacle Is able to step over one shoe box (4.5 in total height) but must slow down and adjust steps to clear box safely. May require verbal cueing.    Gait with Narrow Base of Support Is able to ambulate for 10 steps heel to toe with no staggering.   with [facility]   Gait with Eyes Closed Walks 20 ft, uses assistive device, slower speed, mild gait deviations, deviates 6-10 in outside 12 in walkway width. Ambulates 20 ft in less than 9 sec but greater than 7 sec.    Ambulating Backwards Walks 20 ft, uses assistive device, slower speed, mild gait deviations, deviates 6-10 in outside 12 in walkway width.    Steps Alternating feet, must use rail.  Total Score 18    FGA comment: 18/30, high fall risk           VITALS: Vitals:   08/11/24 1542  BP: 120/82  Pulse: 78                                                                                                                                 TREATMENT DATE: PT Evaluation  Self-Care/Home Management BP assessed in LUE at rest, [facility] Educated pt on driving protocol following seizures and that typically she would be unable to drive following a seizure for 6 months until cleared by her physician. Will continue to monitor this and reach out to her neurologist if needed.    PATIENT EDUCATION: Education details: Eval findings, PT POC, results of OM and functional implications, see above regarding driving Person educated: Patient Education method: Explanation and Demonstration Education comprehension: verbalized understanding, returned demonstration, and needs further education  HOME  EXERCISE PROGRAM: To be initiated, what HEP does she have from [facility]?  GOALS: Goals reviewed with patient? No  SHORT TERM GOALS: Target date: 09/01/2024   Pt will be independent with initial HEP for improved strength, balance, transfers and gait. Baseline: Goal status: INITIAL  2.  Pt will improve gait velocity to at least 2.5 ft/sec for improved gait efficiency and performance at mod I level  Baseline: 2.29 ft/sec mod I with SBQC (9/24) Goal status: INITIAL  3.  Pt will improve 5 x STS to less than or equal to 16 seconds to demonstrate improved functional strength and transfer efficiency.  Baseline: 19.8 sec no UE (9/24) Goal status: INITIAL  4.  Pt will improve FGA to 22/30 for decreased fall risk  Baseline: 18/30 (9/24) Goal status: INITIAL     LONG TERM GOALS: Target date: 09/22/2024   Pt will be independent with final HEP for improved strength, balance, transfers and gait. Baseline:  Goal status: INITIAL  2.  Pt will improve gait velocity to at least 2.75 ft/sec for improved gait efficiency and performance at mod I level  Baseline: 2.29 ft/sec mod I with SBQC (9/24) Goal status: INITIAL  3.  Pt will improve 5 x STS to less than or equal to 13 seconds to demonstrate improved functional strength and transfer efficiency.  Baseline: 19.8 sec no UE (9/24) Goal status: INITIAL  4.  Pt will improve FGA to 26/30 for decreased fall risk  Baseline: 18/30 (9/24) Goal status: INITIAL  5.  Pt will trial gait with no AD to determine if she would be safe to progress away from her [facility] Baseline:  Goal status: INITIAL    ASSESSMENT:  CLINICAL IMPRESSION: Patient is a 53 year old female referred to Neuro OPPT for weakness. Pt's PMH is significant for: HTN, seizures, migraines, anxiety, depression, chronic pain syndrome, fibromyalgia, GERD, hypothyroidism, peptic ulcer disease, G6PD deficiency, multinodular goiter, polyneuropathy, secondary adrenal  insufficiency, multiple allergies, recurrent episodes of anaphylaxis, idiopathic  urticaria . The following deficits were present during the exam: decreased LLE strength, sensory impairments, impaired balance, and decreased insight into deficits and functional implications. Based on her 5xSTS score of 19.8 sec, gait speed of 2.29 ft/sec, FGA score of 18/30, sensory and strength impairments, and fall history, pt is an increased risk for falls. Pt would benefit from skilled PT to address these impairments and functional limitations to maximize functional mobility independence   OBJECTIVE IMPAIRMENTS: Abnormal gait, decreased balance, decreased cognition, decreased coordination, decreased mobility, difficulty walking, decreased strength, impaired perceived functional ability, impaired sensation, postural dysfunction, and pain.   ACTIVITY LIMITATIONS: carrying, lifting, bending, squatting, and stairs  PARTICIPATION LIMITATIONS: driving and community activity  PERSONAL FACTORS: Time since onset of injury/illness/exacerbation and 3+ comorbidities:   HTN, seizures, migraines, anxiety, depression, chronic pain syndrome, fibromyalgia, GERD, hypothyroidism, peptic ulcer disease, G6PD deficiency, multinodular goiter, polyneuropathy, secondary adrenal insufficiency, multiple allergies, recurrent episodes of anaphylaxis, idiopathic urticaria are also affecting patient's functional outcome.   REHAB POTENTIAL: Good  CLINICAL DECISION MAKING: Evolving/moderate complexity  EVALUATION COMPLEXITY: Moderate  PLAN:  PT FREQUENCY: 2x/week  PT DURATION: 6 weeks  PLANNED INTERVENTIONS: 97164- PT Re-evaluation, 97750- Physical Performance Testing, 97110-Therapeutic exercises, 97530- Therapeutic activity, [id number]- Neuromuscular re-education, 97535- Self Care, [id number]- Manual therapy, [id number]- Gait training, [phone]- Orthotic Initial, [phone]- Orthotic/Prosthetic subsequent, [phone]- Aquatic Therapy, [phone]- Electrical stimulation  (manual), [phone] (1-2 muscles), [id number] (3+ muscles)- Dry Needling, Patient/Family education, Balance training, Stair training, Taping, Joint mobilization, Spinal manipulation, Cognitive remediation, DME instructions, Cryotherapy, and Moist heat  PLAN FOR NEXT SESSION: continue education about not driving following seizures, trial gait with no AD, work on LLE NMR and strengthening (hip and knee flexion, address genu recurvatum), what HEP does she have from [facility]?, estim or [place]?   Teague Goynes, PT W[place]ell [place], PT, DPT, CSRS  08/11/2024, 4:03 PM

## 2024-08-11 NOTE — Therapy (Incomplete)
 OUTPATIENT PHYSICAL THERAPY NEURO EVALUATION   Patient Name: Kelly Haas MRN: [id number] DOB:31-Jul-1971, 53 y.o., female Today's Date: 08/11/2024   PCP: Jama Chow, MD REFERRING PROVIDER: Jama Chow, MD  END OF SESSION:   Past Medical History:  Diagnosis Date   Abnormal cortisol level 05/01/2023   Abnormal thyroid  function test 05/01/2023   Anxiety disorder    Atypical squamous cell changes of undetermined significance (ASCUS) on vaginal cytology with positive high risk human papilloma virus (HPV) 09/11/2020   Formatting of this note might be different from the original. Many years ago before hyst cryo for abnormal pap 2021 ASCUS pap with pos HR HPV. Colpo-possible lesion- appears excoriated at the R cuff. Brushing shows inflammation with slightly positive  Ki67 index.10 days of metrogel. Recheck 3 weeks. Repeat pap in 6 mos. 03/2021 Nl pap with pos HR HPV   Bursitis, trochanteric 04/15/2013   Cardiac murmur 04/19/2021   Common migraine with intractable migraine 06/05/2021   Contracture of tendon sheath 11/26/2018   Depression with anxiety    Drug therapy 02/02/2018   Epistaxis 03/13/2016   Essential hypertension 04/19/2021   Fibromyalgia    G-6-PD deficiency 03/13/2016   GERD (gastroesophageal reflux disease)    History of adrenal insufficiency 07/14/2023   History of underactive thyroid  08/27/2022   Hyperemesis gravidarum 03/13/2016   Hyperlipidemia    Hypertension    Insomnia    Kidney stone    Laryngeal trauma 07/23/2012   Long term current use of opiate analgesic 03/12/2013   Migraine    Multiple allergies 07/14/2023   Multiple joint pain 03/13/2016   Multiple myeloma (HCC)    Nontoxic multinodular goiter 08/27/2022   Obesity (BMI 35.0-39.9 without comorbidity) 04/19/2021   Onychomycosis due to dermatophyte 11/03/2018   Osteoarthritis of left hip 03/12/2013   Other reduced mobility 07/15/2023   Ovarian cyst 03/13/2016   Pain due to onychomycosis of toenail of  left foot 02/19/2018   Pain syndrome, chronic 03/12/2013   Peripheral polyneuropathy 02/02/2018   Pharyngitis 03/13/2016   Positive ANA (antinuclear antibody) 03/13/2016   Renal insufficiency    Sacroiliac pain 03/12/2013   Secondary adrenal insufficiency 05/01/2023   Seizure disorder (HCC) 11/13/2017   Syncope and collapse 01/01/2016   Thoracic spondylosis 03/12/2013   Thyroid  disease    Urticaria    Vaginal bleeding 05/20/2019   Formatting of this note might be different from the original.  Prior hyst ~2005 for benign indications Bleeding 05/2019 Postcoital bleeding reported 08/2020   Vitamin D  insufficiency 05/01/2023   Voice hoarseness 02/02/2018   Past Surgical History:  Procedure Laterality Date   ABDOMINAL HYSTERECTOMY     APPENDECTOMY     CHOLECYSTECTOMY     ESOPHAGOGASTRODUODENOSCOPY  08/29/2017   Mild gastritis. Stauts post empiric esophageal dilitation.    TOE SURGERY Left    removed toe nail   TONSILLECTOMY AND ADENOIDECTOMY     Patient Active Problem List   Diagnosis Date Noted   Urticaria    Other reduced mobility 07/15/2023   History of adrenal insufficiency 07/14/2023   Multiple allergies 07/14/2023   Abnormal cortisol level 05/01/2023   Abnormal thyroid  function test 05/01/2023   Secondary adrenal insufficiency 05/01/2023   Vitamin D  insufficiency 05/01/2023   History of underactive thyroid  08/27/2022   Nontoxic multinodular goiter 08/27/2022   Anxiety disorder 06/26/2021   GERD (gastroesophageal reflux disease) 06/26/2021   Hyperlipidemia 06/26/2021   Hypertension 06/26/2021   Common migraine with intractable migraine 06/05/2021   Depression with anxiety 06/01/2021  Fibromyalgia 06/01/2021   Kidney stone 06/01/2021   Migraine 06/01/2021   Thyroid  disease 06/01/2021   Essential hypertension 04/19/2021   Cardiac murmur 04/19/2021   Obesity (BMI 35.0-39.9 without comorbidity) 04/19/2021   Atypical squamous cell changes of undetermined significance  (ASCUS) on vaginal cytology with positive high risk human papilloma virus (HPV) 09/11/2020   Vaginal bleeding 05/20/2019   Contracture of tendon sheath 11/26/2018   Onychomycosis due to dermatophyte 11/03/2018   Pain due to onychomycosis of toenail of left foot 02/19/2018   Drug therapy 02/02/2018   Peripheral polyneuropathy 02/02/2018   Voice hoarseness 02/02/2018   Seizure disorder (HCC) 11/13/2017   Epistaxis 03/13/2016   G-6-PD deficiency 03/13/2016   Hyperemesis gravidarum 03/13/2016   Insomnia 03/13/2016   Multiple joint pain 03/13/2016   Ovarian cyst 03/13/2016   Pharyngitis 03/13/2016   Positive ANA (antinuclear antibody) 03/13/2016   Syncope and collapse 01/01/2016   Bursitis, trochanteric 04/15/2013   Long term current use of opiate analgesic 03/12/2013   Osteoarthritis of left hip 03/12/2013   Pain syndrome, chronic 03/12/2013   Sacroiliac pain 03/12/2013   Thoracic spondylosis 03/12/2013   Laryngeal trauma 07/23/2012    ONSET DATE: 07/16/2024 (referral)   REFERRING DIAG: R53.1 (ICD-10-CM) - Weakness  THERAPY DIAG:  No diagnosis found.  Rationale for Evaluation and Treatment: Rehabilitation  SUBJECTIVE:                                                                                                                                                                                             SUBJECTIVE STATEMENT: *** Pt accompanied by: {accompnied:27141}  PERTINENT HISTORY: chronic pain syndrome, depression, fibromyalgia, GERD, hypothyroidism, peptic ulcer disease  PAIN:  Are you having pain? {OPRCPAIN:27236}  PRECAUTIONS: {Therapy precautions:24002}  RED FLAGS: {PT Red Flags:29287}   WEIGHT BEARING RESTRICTIONS: {Yes ***/No:24003}  FALLS: Has patient fallen in last 6 months? {fallsyesno:27318}  LIVING ENVIRONMENT: Lives with: {OPRC lives with:25569::lives with their family} Lives in: {Lives in:25570} Stairs: {opstairs:27293} Has following  equipment at home: {Assistive devices:23999}  PLOF: {PLOF:24004}  PATIENT GOALS: ***  OBJECTIVE:  Note: Objective measures were completed at Evaluation unless otherwise noted.  DIAGNOSTIC FINDINGS: ***  COGNITION: Overall cognitive status: {cognition:24006}   SENSATION: {sensation:27233}  COORDINATION: ***  EDEMA:  {edema:24020}  MUSCLE TONE: {LE tone:25568}  MUSCLE LENGTH: Hamstrings: Right *** deg; Left *** deg Debby test: Right *** deg; Left *** deg  DTRs:  {DTR SITE:24025}  POSTURE: {posture:25561}  LOWER EXTREMITY ROM:     {AROM/PROM:27142}  Right Eval Left Eval  Hip flexion    Hip extension    Hip abduction    Hip  adduction    Hip internal rotation    Hip external rotation    Knee flexion    Knee extension    Ankle dorsiflexion    Ankle plantarflexion    Ankle inversion    Ankle eversion     (Blank rows = not tested)  LOWER EXTREMITY MMT:    MMT Right Eval Left Eval  Hip flexion    Hip extension    Hip abduction    Hip adduction    Hip internal rotation    Hip external rotation    Knee flexion    Knee extension    Ankle dorsiflexion    Ankle plantarflexion    Ankle inversion    Ankle eversion    (Blank rows = not tested)  BED MOBILITY:  {bed mobility:32615:p}  TRANSFERS: {transfers eval:32620}  RAMP:  {ramp eval:32616}  CURB:  {curb eval:32617}  STAIRS: {stairs eval:32618} GAIT: Findings: {GaitneuroPT:32644::Distance walked: ***,Comments: ***}  FUNCTIONAL TESTS:  {Functional tests:24029}  PATIENT SURVEYS:  {rehab surveys:24030}                                                                                                                              TREATMENT DATE: ***    PATIENT EDUCATION: Education details: *** Person educated: {Person educated:25204} Education method: {Education Method:25205} Education comprehension: {Education Comprehension:25206}  HOME EXERCISE PROGRAM: ***  GOALS: Goals  reviewed with patient? No  SHORT TERM GOALS: Target date: ***  *** Baseline: Goal status: INITIAL  2.  *** Baseline:  Goal status: INITIAL  3.  *** Baseline:  Goal status: INITIAL  4.  *** Baseline:  Goal status: INITIAL  5.  *** Baseline:  Goal status: INITIAL  6.  *** Baseline:  Goal status: INITIAL  LONG TERM GOALS: Target date: ***  *** Baseline:  Goal status: INITIAL  2.  *** Baseline:  Goal status: INITIAL  3.  *** Baseline:  Goal status: INITIAL  4.  *** Baseline:  Goal status: INITIAL  5.  *** Baseline:  Goal status: INITIAL  6.  *** Baseline:  Goal status: INITIAL  ASSESSMENT:  CLINICAL IMPRESSION: Patient is a 53 year old female referred to Neuro [facility] for weakness. Pt's PMH is significant for: chronic pain syndrome, depression, fibromyalgia, GERD, hypothyroidism, peptic ulcer disease. The following deficits were present during the exam: ***. Based on ***, pt is an incr risk for falls. Pt would benefit from skilled PT to address these impairments and functional limitations to maximize functional mobility independence   OBJECTIVE IMPAIRMENTS: {opptimpairments:25111}.   ACTIVITY LIMITATIONS: {activitylimitations:27494}  PARTICIPATION LIMITATIONS: {participationrestrictions:25113}  PERSONAL FACTORS: {Personal factors:25162} are also affecting patient's functional outcome.   REHAB POTENTIAL: {rehabpotential:25112}  CLINICAL DECISION MAKING: {clinical decision making:25114}  EVALUATION COMPLEXITY: {Evaluation complexity:25115}  PLAN:  PT FREQUENCY: {rehab frequency:25116}  PT DURATION: {rehab duration:25117}  PLANNED INTERVENTIONS: {rehab planned interventions:25118::97110-Therapeutic exercises,97530- Therapeutic [phone]- Neuromuscular re-education,97535- Self Rjmz,02859- Manual therapy,Patient/Family education}  PLAN FOR NEXT SESSION: ***  Shalissa Easterwood E Renley Gutman, PT, DPT 08/11/2024, 7:57 AM

## 2024-08-14 ENCOUNTER — Other Ambulatory Visit: Payer: Self-pay | Admitting: Neurology

## 2024-08-16 ENCOUNTER — Ambulatory Visit: Admitting: Physical Therapy

## 2024-08-16 VITALS — BP 107/66 | HR 77

## 2024-08-16 DIAGNOSIS — M6281 Muscle weakness (generalized): Secondary | ICD-10-CM

## 2024-08-16 DIAGNOSIS — R2681 Unsteadiness on feet: Secondary | ICD-10-CM

## 2024-08-16 DIAGNOSIS — R2689 Other abnormalities of gait and mobility: Secondary | ICD-10-CM

## 2024-08-16 DIAGNOSIS — M79605 Pain in left leg: Secondary | ICD-10-CM

## 2024-08-16 NOTE — Therapy (Signed)
 OUTPATIENT PHYSICAL THERAPY NEURO TREATMENT   Patient Name: Kelly Haas MRN: [id number] DOB:1971-07-27, 53 y.o., female Today's Date: 08/16/2024   PCP: Kelly Chow, MD REFERRING PROVIDER: Jama Chow, MD  END OF SESSION:  PT End of Session - 08/16/24 0940     Visit Number 2    Number of Visits 13   with eval   Date for Recertification  10/06/24   to allow for scheduling delays   Authorization Type Medicare    PT Start Time [phone]   Pt arrived late   PT Stop Time 1016    PT Time Calculation (min) 38 min    Equipment Utilized During Treatment Gait belt    Activity Tolerance Patient tolerated treatment well    Behavior During Therapy [facility] for tasks assessed/performed           Past Medical History:  Diagnosis Date   Abnormal cortisol level 05/01/2023   Abnormal thyroid  function test 05/01/2023   Anxiety disorder    Atypical squamous cell changes of undetermined significance (ASCUS) on vaginal cytology with positive high risk human papilloma virus (HPV) 09/11/2020   Formatting of this note might be different from the original. Many years ago before hyst cryo for abnormal pap 2021 ASCUS pap with pos HR HPV. Colpo-possible lesion- appears excoriated at the R cuff. Brushing shows inflammation with slightly positive  Ki67 index.10 days of metrogel. Recheck 3 weeks. Repeat pap in 6 mos. 03/2021 Nl pap with pos HR HPV   Bursitis, trochanteric 04/15/2013   Cardiac murmur 04/19/2021   Common migraine with intractable migraine 06/05/2021   Contracture of tendon sheath 11/26/2018   Depression with anxiety    Drug therapy 02/02/2018   Epistaxis 03/13/2016   Essential hypertension 04/19/2021   Fibromyalgia    G-6-PD deficiency 03/13/2016   GERD (gastroesophageal reflux disease)    History of adrenal insufficiency 07/14/2023   History of underactive thyroid  08/27/2022   Hyperemesis gravidarum 03/13/2016   Hyperlipidemia    Hypertension    Insomnia    Kidney stone    Laryngeal  trauma 07/23/2012   Long term current use of opiate analgesic 03/12/2013   Migraine    Multiple allergies 07/14/2023   Multiple joint pain 03/13/2016   Multiple myeloma (HCC)    Nontoxic multinodular goiter 08/27/2022   Obesity (BMI 35.0-39.9 without comorbidity) 04/19/2021   Onychomycosis due to dermatophyte 11/03/2018   Osteoarthritis of left hip 03/12/2013   Other reduced mobility 07/15/2023   Ovarian cyst 03/13/2016   Pain due to onychomycosis of toenail of left foot 02/19/2018   Pain syndrome, chronic 03/12/2013   Peripheral polyneuropathy 02/02/2018   Pharyngitis 03/13/2016   Positive ANA (antinuclear antibody) 03/13/2016   Renal insufficiency    Sacroiliac pain 03/12/2013   Secondary adrenal insufficiency 05/01/2023   Seizure disorder (HCC) 11/13/2017   Syncope and collapse 01/01/2016   Thoracic spondylosis 03/12/2013   Thyroid  disease    Urticaria    Vaginal bleeding 05/20/2019   Formatting of this note might be different from the original.  Prior hyst ~2005 for benign indications Bleeding 05/2019 Postcoital bleeding reported 08/2020   Vitamin D  insufficiency 05/01/2023   Voice hoarseness 02/02/2018   Past Surgical History:  Procedure Laterality Date   ABDOMINAL HYSTERECTOMY     APPENDECTOMY     CHOLECYSTECTOMY     ESOPHAGOGASTRODUODENOSCOPY  08/29/2017   Mild gastritis. Stauts post empiric esophageal dilitation.    TOE SURGERY Left    removed toe nail   TONSILLECTOMY AND  ADENOIDECTOMY     Patient Active Problem List   Diagnosis Date Noted   Urticaria    Other reduced mobility 07/15/2023   History of adrenal insufficiency 07/14/2023   Multiple allergies 07/14/2023   Abnormal cortisol level 05/01/2023   Abnormal thyroid  function test 05/01/2023   Secondary adrenal insufficiency 05/01/2023   Vitamin D  insufficiency 05/01/2023   History of underactive thyroid  08/27/2022   Nontoxic multinodular goiter 08/27/2022   Anxiety disorder 06/26/2021   GERD  (gastroesophageal reflux disease) 06/26/2021   Hyperlipidemia 06/26/2021   Hypertension 06/26/2021   Common migraine with intractable migraine 06/05/2021   Depression with anxiety 06/01/2021   Fibromyalgia 06/01/2021   Kidney stone 06/01/2021   Migraine 06/01/2021   Thyroid  disease 06/01/2021   Essential hypertension 04/19/2021   Cardiac murmur 04/19/2021   Obesity (BMI 35.0-39.9 without comorbidity) 04/19/2021   Atypical squamous cell changes of undetermined significance (ASCUS) on vaginal cytology with positive high risk human papilloma virus (HPV) 09/11/2020   Vaginal bleeding 05/20/2019   Contracture of tendon sheath 11/26/2018   Onychomycosis due to dermatophyte 11/03/2018   Pain due to onychomycosis of toenail of left foot 02/19/2018   Drug therapy 02/02/2018   Peripheral polyneuropathy 02/02/2018   Voice hoarseness 02/02/2018   Seizure disorder (HCC) 11/13/2017   Epistaxis 03/13/2016   G-6-PD deficiency 03/13/2016   Hyperemesis gravidarum 03/13/2016   Insomnia 03/13/2016   Multiple joint pain 03/13/2016   Ovarian cyst 03/13/2016   Pharyngitis 03/13/2016   Positive ANA (antinuclear antibody) 03/13/2016   Syncope and collapse 01/01/2016   Bursitis, trochanteric 04/15/2013   Long term current use of opiate analgesic 03/12/2013   Osteoarthritis of left hip 03/12/2013   Pain syndrome, chronic 03/12/2013   Sacroiliac pain 03/12/2013   Thoracic spondylosis 03/12/2013   Laryngeal trauma 07/23/2012    ONSET DATE: 07/16/2024 (referral)   REFERRING DIAG: R53.1 (ICD-10-CM) - Weakness  THERAPY DIAG:  Muscle weakness (generalized)  Other abnormalities of gait and mobility  Unsteadiness on feet  Pain in left leg  Rationale for Evaluation and Treatment: Rehabilitation  SUBJECTIVE:                                                                                                                                                                                              SUBJECTIVE STATEMENT: Kelly Haas  Pt presents w/SBQC. States she drove herself. Denies falls but has had plenty of wobbles.   Did not bring her HEP from [facility]. States when the clinic closed, she stopped doing them.    Pt accompanied by: self, drove herself today (educated pt on driving protocol after seizures)  PERTINENT HISTORY: PMH: HTN, seizures, migraines, anxiety, depression, chronic pain syndrome, fibromyalgia, GERD, hypothyroidism, peptic ulcer disease, G6PD deficiency, multinodular goiter, polyneuropathy, secondary adrenal insufficiency, multiple allergies, recurrent episodes of anaphylaxis, idiopathic urticaria   PAIN:  Are you having pain? Yes: NPRS scale: 0/10 currently, gets up to 8/10 Pain location: L thigh Pain description: sharp Aggravating factors: N/A Relieving factors: hot shower  PRECAUTIONS: Fall  RED FLAGS: None   WEIGHT BEARING RESTRICTIONS: No  FALLS: Has patient fallen in last 6 months? Yes. Number of falls frequently, unable to give further information   SEIZURES: Most recent one was 3 weeks ago, had a headache while driving and called her daughter who came to get her and she pulled over Lack of sleep and stress throw everything off  LIVING ENVIRONMENT: Lives with: lives alone Lives in: House/apartment Stairs: Yes: External: 2 steps; none; steps at back door, level entry at front door Has following equipment at home: Quad cane small base, Environmental consultant - 4 wheeled, Wheelchair (manual), shower chair, and bed side commode - BSC sits over her toilet  PLOF: Independent with basic ADLs, Independent with gait, Independent with transfers, and Requires assistive device for independence  PATIENT GOALS: to get off my convertible [facility]), I want to run-I know I can't run, run, I want this leg (left) to be able to hop, skip, and jump  OBJECTIVE:  Note: Objective measures were completed at Evaluation unless otherwise noted.  DIAGNOSTIC  FINDINGS: None updated or relevant to this POC  COGNITION: Overall cognitive status: her family says she will repeat stuff, possible short term memory impairments   SENSATION: Gets numbness in LLE at times, no tingling Decreased light touch sensation in LLE, tingling in distal LLE  COORDINATION: Impaired LLE  POSTURE: forward head and hunched shoulders  LOWER EXTREMITY ROM:   WFL  Active  Right Eval Left Eval  Hip flexion    Hip extension    Hip abduction    Hip adduction    Hip internal rotation    Hip external rotation    Knee flexion    Knee extension    Ankle dorsiflexion    Ankle plantarflexion    Ankle inversion    Ankle eversion     (Blank rows = not tested)  LOWER EXTREMITY MMT:    MMT Right Eval Left Eval  Hip flexion 5 4+  Hip extension    Hip abduction    Hip adduction    Hip internal rotation    Hip external rotation    Knee flexion 5 4  Knee extension 5 4  Ankle dorsiflexion 5 4  Ankle plantarflexion    Ankle inversion    Ankle eversion    (Blank rows = not tested)  BED MOBILITY:  Mod I per pt report  TRANSFERS: Sit to stand: Modified independence  Assistive device utilized: None     Stand to sit: Modified independence  Assistive device utilized: None     Chair to chair: Modified independence  Assistive device utilized: None       RAMP:  Not tested  CURB:  Not tested  STAIRS: STAIRS:  Level of Assistance: CGA  Stair Negotiation Technique: Alternating Pattern  with Bilateral Rails  Number of Stairs: 4   Height of Stairs: 6  Comments: WFL   GAIT: Findings:  Gait pattern: decreased hip/knee flexion- Left and genu recurvatum- Left Distance walked: various clinic distances Assistive device utilized: Quad cane small base Level of assistance: Modified independence   FUNCTIONAL  TESTS:       VITALS: Vitals:   08/16/24 0944  BP: 107/66  Pulse: 77                                                                                                                                   TREATMENT:   Self-Care/Home Management Assessed vitals in LUE at rest (see above) and WNL  Ther Act  SciFit multi-peaks level 8.0 for 8 minutes using BUE/BLEs for neural priming for reciprocal movement, dynamic cardiovascular warmup and increased amplitude of stepping. RPE of 6-7/10 following activity  In // bars for improved functional hip strength, step clearance and single leg stability:  Fwd/lateral/retro monster walks w/green resistance band around ankles, x3 reps down and back w/BUE support. Pt able to maintain mini squat position throughout. Min cues to avoid dragging R foot w/movement. Added to HEP (see bolded below) Fwd adv/retreat over 6 hurdle, x15 reps per side w/light BUE support and progressing to no UE support. Pt very hesitant to perform without BUE support as this is her safety blanket. Lengthy discussion regarding importance of trying exercises without UE support in PT for improved strength and confidence on LLE. Encouraged pt to focus on walking w/SBQC at home rather than furniture walking as this is safer and will enable pt to work on her ankle/hip/stepping strategy for ease of transfer to gait w/o AD. Pt verbalized understanding.    PATIENT EDUCATION: Education details: initial HEP, preference to use SBQC at home rather than furniture walk  Person educated: Patient Education method: Programmer, multimedia, Facilities manager, Verbal cues, and Handouts Education comprehension: verbalized understanding, returned demonstration, verbal cues required, and needs further education  HOME EXERCISE PROGRAM: Access Code: [email or web address] URL: [email or web address] Date: 08/16/2024 Prepared by: Marlon Gaje Tennyson  Exercises - Side Stepping with Resistance at Ankles and Counter Support  - 1 x daily - 7 x weekly - 3 sets - 10 reps - Backward Monster Walk with Resistance at Ankles and Counter Support  - 1 x daily - 7 x weekly - 3 sets -  10 reps  GOALS: Goals reviewed with patient? No  SHORT TERM GOALS: Target date: 09/01/2024   Pt will be independent with initial HEP for improved strength, balance, transfers and gait. Baseline: Goal status: INITIAL  2.  Pt will improve gait velocity to at least 2.5 ft/sec for improved gait efficiency and performance at mod I level  Baseline: 2.29 ft/sec mod I with SBQC (9/24) Goal status: INITIAL  3.  Pt will improve 5 x STS to less than or equal to 16 seconds to demonstrate improved functional strength and transfer efficiency.  Baseline: 19.8 sec no UE (9/24) Goal status: INITIAL  4.  Pt will improve FGA to 22/30 for decreased fall risk  Baseline: 18/30 (9/24) Goal status: INITIAL     LONG TERM GOALS: Target date: 09/22/2024   Pt will be independent with final HEP for improved strength, balance, transfers and  gait. Baseline:  Goal status: INITIAL  2.  Pt will improve gait velocity to at least 2.75 ft/sec for improved gait efficiency and performance at mod I level  Baseline: 2.29 ft/sec mod I with SBQC (9/24) Goal status: INITIAL  3.  Pt will improve 5 x STS to less than or equal to 13 seconds to demonstrate improved functional strength and transfer efficiency.  Baseline: 19.8 sec no UE (9/24) Goal status: INITIAL  4.  Pt will improve FGA to 26/30 for decreased fall risk  Baseline: 18/30 (9/24) Goal status: INITIAL  5.  Pt will trial gait with no AD to determine if she would be safe to progress away from her [facility] Baseline:  Goal status: INITIAL    ASSESSMENT:  CLINICAL IMPRESSION: Session limited due to pt's late arrival. Emphasis of skilled PT session on establishing initial HEP and education on use of AD at all times. Pt reports she drove herself today so will continue to benefit from education on driving following a seizure. Pt demonstrates fear-avoidance behavior when performing exercises without UE support, requiring min-mod encouraging cues to perform  tasks without support for improved strength of LLE and reactive balance strategies. Recommended pt continue to use AD at all times vs rely on furniture walking to improve reacting balance strategies. Continue POC.    OBJECTIVE IMPAIRMENTS: Abnormal gait, decreased balance, decreased cognition, decreased coordination, decreased mobility, difficulty walking, decreased strength, impaired perceived functional ability, impaired sensation, postural dysfunction, and pain.   ACTIVITY LIMITATIONS: carrying, lifting, bending, squatting, and stairs  PARTICIPATION LIMITATIONS: driving and community activity  PERSONAL FACTORS: Time since onset of injury/illness/exacerbation and 3+ comorbidities:   HTN, seizures, migraines, anxiety, depression, chronic pain syndrome, fibromyalgia, GERD, hypothyroidism, peptic ulcer disease, G6PD deficiency, multinodular goiter, polyneuropathy, secondary adrenal insufficiency, multiple allergies, recurrent episodes of anaphylaxis, idiopathic urticaria are also affecting patient's functional outcome.   REHAB POTENTIAL: Good  CLINICAL DECISION MAKING: Evolving/moderate complexity  EVALUATION COMPLEXITY: Moderate  PLAN:  PT FREQUENCY: 2x/week  PT DURATION: 6 weeks  PLANNED INTERVENTIONS: [id number]- PT Re-evaluation, 97750- Physical Performance Testing, 97110-Therapeutic exercises, 97530- Therapeutic activity, [id number]- Neuromuscular re-education, 97535- Self Care, [id number]- Manual therapy, [id number]- Gait training, [phone]- Orthotic Initial, [phone]- Orthotic/Prosthetic subsequent, [phone]- Aquatic Therapy, [phone]- Electrical stimulation (manual), [phone] (1-2 muscles), [id number] (3+ muscles)- Dry Needling, Patient/Family education, Balance training, Stair training, Taping, Joint mobilization, Spinal manipulation, Cognitive remediation, DME instructions, Cryotherapy, and Moist heat  PLAN FOR NEXT SESSION: continue education about not driving following seizures, trial gait with no AD, work on LLE NMR  and strengthening (hip and knee flexion, address genu recurvatum), what HEP does she have from [facility]?, estim or [facility]? Work on stability without UE support, ankle/hip strategy    Raeshawn Vo E Dionel Archey, PT, DPT  08/16/2024, 10:17 AM

## 2024-08-23 ENCOUNTER — Ambulatory Visit: Admitting: Physical Therapy

## 2024-08-23 NOTE — Therapy (Incomplete)
 OUTPATIENT PHYSICAL THERAPY NEURO TREATMENT   Patient Name: Kelly Haas MRN: [id number] DOB:29-May-1971, 53 y.o., female Today's Date: 08/23/2024   PCP: Kelly Chow, MD REFERRING PROVIDER: Jama Chow, MD  END OF SESSION:     Past Medical History:  Diagnosis Date   Abnormal cortisol level 05/01/2023   Abnormal thyroid  function test 05/01/2023   Anxiety disorder    Atypical squamous cell changes of undetermined significance (ASCUS) on vaginal cytology with positive high risk human papilloma virus (HPV) 09/11/2020   Formatting of this note might be different from the original. Many years ago before hyst cryo for abnormal pap 2021 ASCUS pap with pos HR HPV. Colpo-possible lesion- appears excoriated at the R cuff. Brushing shows inflammation with slightly positive  Ki67 index.10 days of metrogel. Recheck 3 weeks. Repeat pap in 6 mos. 03/2021 Nl pap with pos HR HPV   Bursitis, trochanteric 04/15/2013   Cardiac murmur 04/19/2021   Common migraine with intractable migraine 06/05/2021   Contracture of tendon sheath 11/26/2018   Depression with anxiety    Drug therapy 02/02/2018   Epistaxis 03/13/2016   Essential hypertension 04/19/2021   Fibromyalgia    G-6-PD deficiency 03/13/2016   GERD (gastroesophageal reflux disease)    History of adrenal insufficiency 07/14/2023   History of underactive thyroid  08/27/2022   Hyperemesis gravidarum 03/13/2016   Hyperlipidemia    Hypertension    Insomnia    Kidney stone    Laryngeal trauma 07/23/2012   Long term current use of opiate analgesic 03/12/2013   Migraine    Multiple allergies 07/14/2023   Multiple joint pain 03/13/2016   Multiple myeloma (HCC)    Nontoxic multinodular goiter 08/27/2022   Obesity (BMI 35.0-39.9 without comorbidity) 04/19/2021   Onychomycosis due to dermatophyte 11/03/2018   Osteoarthritis of left hip 03/12/2013   Other reduced mobility 07/15/2023   Ovarian cyst 03/13/2016   Pain due to onychomycosis of toenail  of left foot 02/19/2018   Pain syndrome, chronic 03/12/2013   Peripheral polyneuropathy 02/02/2018   Pharyngitis 03/13/2016   Positive ANA (antinuclear antibody) 03/13/2016   Renal insufficiency    Sacroiliac pain 03/12/2013   Secondary adrenal insufficiency 05/01/2023   Seizure disorder (HCC) 11/13/2017   Syncope and collapse 01/01/2016   Thoracic spondylosis 03/12/2013   Thyroid  disease    Urticaria    Vaginal bleeding 05/20/2019   Formatting of this note might be different from the original.  Prior hyst ~2005 for benign indications Bleeding 05/2019 Postcoital bleeding reported 08/2020   Vitamin D  insufficiency 05/01/2023   Voice hoarseness 02/02/2018   Past Surgical History:  Procedure Laterality Date   ABDOMINAL HYSTERECTOMY     APPENDECTOMY     CHOLECYSTECTOMY     ESOPHAGOGASTRODUODENOSCOPY  08/29/2017   Mild gastritis. Stauts post empiric esophageal dilitation.    TOE SURGERY Left    removed toe nail   TONSILLECTOMY AND ADENOIDECTOMY     Patient Active Problem List   Diagnosis Date Noted   Urticaria    Other reduced mobility 07/15/2023   History of adrenal insufficiency 07/14/2023   Multiple allergies 07/14/2023   Abnormal cortisol level 05/01/2023   Abnormal thyroid  function test 05/01/2023   Secondary adrenal insufficiency 05/01/2023   Vitamin D  insufficiency 05/01/2023   History of underactive thyroid  08/27/2022   Nontoxic multinodular goiter 08/27/2022   Anxiety disorder 06/26/2021   GERD (gastroesophageal reflux disease) 06/26/2021   Hyperlipidemia 06/26/2021   Hypertension 06/26/2021   Common migraine with intractable migraine 06/05/2021   Depression with  anxiety 06/01/2021   Fibromyalgia 06/01/2021   Kidney stone 06/01/2021   Migraine 06/01/2021   Thyroid  disease 06/01/2021   Essential hypertension 04/19/2021   Cardiac murmur 04/19/2021   Obesity (BMI 35.0-39.9 without comorbidity) 04/19/2021   Atypical squamous cell changes of undetermined  significance (ASCUS) on vaginal cytology with positive high risk human papilloma virus (HPV) 09/11/2020   Vaginal bleeding 05/20/2019   Contracture of tendon sheath 11/26/2018   Onychomycosis due to dermatophyte 11/03/2018   Pain due to onychomycosis of toenail of left foot 02/19/2018   Drug therapy 02/02/2018   Peripheral polyneuropathy 02/02/2018   Voice hoarseness 02/02/2018   Seizure disorder (HCC) 11/13/2017   Epistaxis 03/13/2016   G-6-PD deficiency 03/13/2016   Hyperemesis gravidarum 03/13/2016   Insomnia 03/13/2016   Multiple joint pain 03/13/2016   Ovarian cyst 03/13/2016   Pharyngitis 03/13/2016   Positive ANA (antinuclear antibody) 03/13/2016   Syncope and collapse 01/01/2016   Bursitis, trochanteric 04/15/2013   Long term current use of opiate analgesic 03/12/2013   Osteoarthritis of left hip 03/12/2013   Pain syndrome, chronic 03/12/2013   Sacroiliac pain 03/12/2013   Thoracic spondylosis 03/12/2013   Laryngeal trauma 07/23/2012    ONSET DATE: 07/16/2024 (referral)   REFERRING DIAG: R53.1 (ICD-10-CM) - Weakness  THERAPY DIAG:  No diagnosis found.  Rationale for Evaluation and Treatment: Rehabilitation  SUBJECTIVE:                                                                                                                                                                                             SUBJECTIVE STATEMENT: [name]  Pt presents w/SBQC. States she drove herself. Denies falls but has had plenty of wobbles.   Did not bring her HEP from [facility]. States when the clinic closed, she stopped doing them.   ***   Pt accompanied by: self, drove herself today (educated pt on driving protocol after seizures)  PERTINENT HISTORY: PMH: HTN, seizures, migraines, anxiety, depression, chronic pain syndrome, fibromyalgia, GERD, hypothyroidism, peptic ulcer disease, G6PD deficiency, multinodular goiter, polyneuropathy, secondary adrenal  insufficiency, multiple allergies, recurrent episodes of anaphylaxis, idiopathic urticaria   PAIN:  Are you having pain? Yes: NPRS scale: 0/10 currently, gets up to 8/10 Pain location: L thigh Pain description: sharp Aggravating factors: N/A Relieving factors: hot shower  PRECAUTIONS: Fall  RED FLAGS: None   WEIGHT BEARING RESTRICTIONS: No  FALLS: Has patient fallen in last 6 months? Yes. Number of falls frequently, unable to give further information   SEIZURES: Most recent one was 3 weeks ago, had a headache while driving and called her daughter who came to get  her and she pulled over Lack of sleep and stress throw everything off  LIVING ENVIRONMENT: Lives with: lives alone Lives in: House/apartment Stairs: Yes: External: 2 steps; none; steps at back door, level entry at front door Has following equipment at home: Quad cane small base, Environmental consultant - 4 wheeled, Wheelchair (manual), shower chair, and bed side commode - BSC sits over her toilet  PLOF: Independent with basic ADLs, Independent with gait, Independent with transfers, and Requires assistive device for independence  PATIENT GOALS: to get off my convertible [facility]), I want to run-I know I can't run, run, I want this leg (left) to be able to hop, skip, and jump  OBJECTIVE:  Note: Objective measures were completed at Evaluation unless otherwise noted.  DIAGNOSTIC FINDINGS: None updated or relevant to this POC  COGNITION: Overall cognitive status: her family says she will repeat stuff, possible short term memory impairments   SENSATION: Gets numbness in LLE at times, no tingling Decreased light touch sensation in LLE, tingling in distal LLE  COORDINATION: Impaired LLE  POSTURE: forward head and hunched shoulders  LOWER EXTREMITY ROM:   WFL  Active  Right Eval Left Eval  Hip flexion    Hip extension    Hip abduction    Hip adduction    Hip internal rotation    Hip external rotation    Knee flexion     Knee extension    Ankle dorsiflexion    Ankle plantarflexion    Ankle inversion    Ankle eversion     (Blank rows = not tested)  LOWER EXTREMITY MMT:    MMT Right Eval Left Eval  Hip flexion 5 4+  Hip extension    Hip abduction    Hip adduction    Hip internal rotation    Hip external rotation    Knee flexion 5 4  Knee extension 5 4  Ankle dorsiflexion 5 4  Ankle plantarflexion    Ankle inversion    Ankle eversion    (Blank rows = not tested)  BED MOBILITY:  Mod I per pt report  TRANSFERS: Sit to stand: Modified independence  Assistive device utilized: None     Stand to sit: Modified independence  Assistive device utilized: None     Chair to chair: Modified independence  Assistive device utilized: None       RAMP:  Not tested  CURB:  Not tested  STAIRS: STAIRS:  Level of Assistance: CGA  Stair Negotiation Technique: Alternating Pattern  with Bilateral Rails  Number of Stairs: 4   Height of Stairs: 6  Comments: WFL   GAIT: Findings:  Gait pattern: decreased hip/knee flexion- Left and genu recurvatum- Left Distance walked: various clinic distances Assistive device utilized: Quad cane small base Level of assistance: Modified independence   FUNCTIONAL TESTS:       VITALS: There were no vitals filed for this visit.  TREATMENT:   Self-Care/Home Management Assessed vitals in LUE at rest (see above) and WNL***  Ther Act  SciFit multi-peaks level 8.0 for 8 minutes using BUE/BLEs for neural priming for reciprocal movement, dynamic cardiovascular warmup and increased amplitude of stepping. RPE of 6-7/10 following activity  In // bars for improved functional hip strength, step clearance and single leg stability:  Fwd/lateral/retro monster walks w/green resistance band around ankles, x3 reps down and back w/BUE support. Pt  able to maintain mini squat position throughout. Min cues to avoid dragging R foot w/movement. Added to HEP (see bolded below) Fwd adv/retreat over 6 hurdle, x15 reps per side w/light BUE support and progressing to no UE support. Pt very hesitant to perform without BUE support as this is her safety blanket. Lengthy discussion regarding importance of trying exercises without UE support in PT for improved strength and confidence on LLE. Encouraged pt to focus on walking w/SBQC at home rather than furniture walking as this is safer and will enable pt to work on her ankle/hip/stepping strategy for ease of transfer to gait w/o AD. Pt verbalized understanding.  ***   PATIENT EDUCATION: Education details: initial HEP, preference to use SBQC at home rather than furniture walk***  Person educated: Patient Education method: Programmer, multimedia, Facilities manager, Verbal cues, and Handouts Education comprehension: verbalized understanding, returned demonstration, verbal cues required, and needs further education  HOME EXERCISE PROGRAM: Access Code: [email or web address] URL: [email or web address] Date: 08/16/2024 Prepared by: Marlon Plaster  Exercises - Side Stepping with Resistance at Ankles and Counter Support  - 1 x daily - 7 x weekly - 3 sets - 10 reps - Backward Monster Walk with Resistance at Ankles and Counter Support  - 1 x daily - 7 x weekly - 3 sets - 10 reps  GOALS: Goals reviewed with patient? No  SHORT TERM GOALS: Target date: 09/01/2024   Pt will be independent with initial HEP for improved strength, balance, transfers and gait. Baseline: Goal status: INITIAL  2.  Pt will improve gait velocity to at least 2.5 ft/sec for improved gait efficiency and performance at mod I level  Baseline: 2.29 ft/sec mod I with SBQC (9/24) Goal status: INITIAL  3.  Pt will improve 5 x STS to less than or equal to 16 seconds to demonstrate improved functional strength and transfer efficiency.  Baseline:  19.8 sec no UE (9/24) Goal status: INITIAL  4.  Pt will improve FGA to 22/30 for decreased fall risk  Baseline: 18/30 (9/24) Goal status: INITIAL     LONG TERM GOALS: Target date: 09/22/2024   Pt will be independent with final HEP for improved strength, balance, transfers and gait. Baseline:  Goal status: INITIAL  2.  Pt will improve gait velocity to at least 2.75 ft/sec for improved gait efficiency and performance at mod I level  Baseline: 2.29 ft/sec mod I with SBQC (9/24) Goal status: INITIAL  3.  Pt will improve 5 x STS to less than or equal to 13 seconds to demonstrate improved functional strength and transfer efficiency.  Baseline: 19.8 sec no UE (9/24) Goal status: INITIAL  4.  Pt will improve FGA to 26/30 for decreased fall risk  Baseline: 18/30 (9/24) Goal status: INITIAL  5.  Pt will trial gait with no AD to determine if she would be safe to progress away from her [facility] Baseline:  Goal status: INITIAL    ASSESSMENT:  CLINICAL IMPRESSION: Session limited due to pt's late arrival. Emphasis of skilled PT session on*** establishing initial HEP  and education on use of AD at all times. Pt reports she drove herself today so will continue to benefit from education on driving following a seizure. Pt demonstrates fear-avoidance behavior when performing exercises without UE support, requiring min-mod encouraging cues to perform tasks without support for improved strength of LLE and reactive balance strategies. Recommended pt continue to use AD at all times vs rely on furniture walking to improve reacting balance strategies. Continue POC.    OBJECTIVE IMPAIRMENTS: Abnormal gait, decreased balance, decreased cognition, decreased coordination, decreased mobility, difficulty walking, decreased strength, impaired perceived functional ability, impaired sensation, postural dysfunction, and pain.   ACTIVITY LIMITATIONS: carrying, lifting, bending, squatting, and  stairs  PARTICIPATION LIMITATIONS: driving and community activity  PERSONAL FACTORS: Time since onset of injury/illness/exacerbation and 3+ comorbidities:   HTN, seizures, migraines, anxiety, depression, chronic pain syndrome, fibromyalgia, GERD, hypothyroidism, peptic ulcer disease, G6PD deficiency, multinodular goiter, polyneuropathy, secondary adrenal insufficiency, multiple allergies, recurrent episodes of anaphylaxis, idiopathic urticaria are also affecting patient's functional outcome.   REHAB POTENTIAL: Good  CLINICAL DECISION MAKING: Evolving/moderate complexity  EVALUATION COMPLEXITY: Moderate  PLAN:  PT FREQUENCY: 2x/week  PT DURATION: 6 weeks  PLANNED INTERVENTIONS: 97164- PT Re-evaluation, 97750- Physical Performance Testing, 97110-Therapeutic exercises, 97530- Therapeutic activity, [id number]- Neuromuscular re-education, 97535- Self Care, [id number]- Manual therapy, [id number]- Gait training, [phone]- Orthotic Initial, [phone]- Orthotic/Prosthetic subsequent, [phone]- Aquatic Therapy, [phone]- Electrical stimulation (manual), [phone] (1-2 muscles), [id number] (3+ muscles)- Dry Needling, Patient/Family education, Balance training, Stair training, Taping, Joint mobilization, Spinal manipulation, Cognitive remediation, DME instructions, Cryotherapy, and Moist heat  PLAN FOR NEXT SESSION: continue education about not driving following seizures, trial gait with no AD, work on LLE NMR and strengthening (hip and knee flexion, address genu recurvatum), what HEP does she have from [facility]?, estim or [facility]? Work on stability without UE support, ankle/hip strategy***    Waddell Southgate, PT Waddell Southgate, PT, DPT, CSRS   08/23/2024, 7:45 AM

## 2024-08-25 ENCOUNTER — Ambulatory Visit: Attending: Internal Medicine | Admitting: Physical Therapy

## 2024-08-25 VITALS — BP 100/64 | HR 77

## 2024-08-25 DIAGNOSIS — R2689 Other abnormalities of gait and mobility: Secondary | ICD-10-CM | POA: Insufficient documentation

## 2024-08-25 DIAGNOSIS — R42 Dizziness and giddiness: Secondary | ICD-10-CM | POA: Diagnosis present

## 2024-08-25 DIAGNOSIS — R2681 Unsteadiness on feet: Secondary | ICD-10-CM | POA: Insufficient documentation

## 2024-08-25 DIAGNOSIS — M6281 Muscle weakness (generalized): Secondary | ICD-10-CM | POA: Diagnosis present

## 2024-08-25 NOTE — Therapy (Signed)
 OUTPATIENT PHYSICAL THERAPY NEURO TREATMENT   Patient Name: Kelly Haas MRN: [id number] DOB:[birth year], 53 y.o., female Today's Date: 08/25/2024   PCP: Kelly Chow, MD REFERRING PROVIDER: Jama Chow, MD  END OF SESSION:  PT End of Session - 08/25/24 1533     Visit Number 3    Number of Visits 13   with eval   Date for Recertification  10/06/24   to allow for scheduling delays   Authorization Type Medicare    PT Start Time 1530    PT Stop Time 1615    PT Time Calculation (min) 45 min    Equipment Utilized During Treatment Gait belt    Activity Tolerance Patient tolerated treatment well    Behavior During Therapy [facility] for tasks assessed/performed            Past Medical History:  Diagnosis Date   Abnormal cortisol level 05/01/2023   Abnormal thyroid  function test 05/01/2023   Anxiety disorder    Atypical squamous cell changes of undetermined significance (ASCUS) on vaginal cytology with positive high risk human papilloma virus (HPV) 09/11/2020   Formatting of this note might be different from the original. Many years ago before hyst cryo for abnormal pap 2021 ASCUS pap with pos HR HPV. Colpo-possible lesion- appears excoriated at the R cuff. Brushing shows inflammation with slightly positive  Ki67 index.10 days of metrogel. Recheck 3 weeks. Repeat pap in 6 mos. 03/2021 Nl pap with pos HR HPV   Bursitis, trochanteric 04/15/2013   Cardiac murmur 04/19/2021   Common migraine with intractable migraine 06/05/2021   Contracture of tendon sheath 11/26/2018   Depression with anxiety    Drug therapy 02/02/2018   Epistaxis 03/13/2016   Essential hypertension 04/19/2021   Fibromyalgia    G-6-PD deficiency 03/13/2016   GERD (gastroesophageal reflux disease)    History of adrenal insufficiency 07/14/2023   History of underactive thyroid  08/27/2022   Hyperemesis gravidarum 03/13/2016   Hyperlipidemia    Hypertension    Insomnia    Kidney stone    Laryngeal trauma 07/23/2012    Long term current use of opiate analgesic 03/12/2013   Migraine    Multiple allergies 07/14/2023   Multiple joint pain 03/13/2016   Multiple myeloma (HCC)    Nontoxic multinodular goiter 08/27/2022   Obesity (BMI 35.0-39.9 without comorbidity) 04/19/2021   Onychomycosis due to dermatophyte 11/03/2018   Osteoarthritis of left hip 03/12/2013   Other reduced mobility 07/15/2023   Ovarian cyst 03/13/2016   Pain due to onychomycosis of toenail of left foot 02/19/2018   Pain syndrome, chronic 03/12/2013   Peripheral polyneuropathy 02/02/2018   Pharyngitis 03/13/2016   Positive ANA (antinuclear antibody) 03/13/2016   Renal insufficiency    Sacroiliac pain 03/12/2013   Secondary adrenal insufficiency 05/01/2023   Seizure disorder (HCC) 11/13/2017   Syncope and collapse 01/01/2016   Thoracic spondylosis 03/12/2013   Thyroid  disease    Urticaria    Vaginal bleeding 05/20/2019   Formatting of this note might be different from the original.  Prior hyst ~2005 for benign indications Bleeding 05/2019 Postcoital bleeding reported 08/2020   Vitamin D  insufficiency 05/01/2023   Voice hoarseness 02/02/2018   Past Surgical History:  Procedure Laterality Date   ABDOMINAL HYSTERECTOMY     APPENDECTOMY     CHOLECYSTECTOMY     ESOPHAGOGASTRODUODENOSCOPY  08/29/2017   Mild gastritis. Stauts post empiric esophageal dilitation.    TOE SURGERY Left    removed toe nail   TONSILLECTOMY AND ADENOIDECTOMY  Patient Active Problem List   Diagnosis Date Noted   Urticaria    Other reduced mobility 07/15/2023   History of adrenal insufficiency 07/14/2023   Multiple allergies 07/14/2023   Abnormal cortisol level 05/01/2023   Abnormal thyroid  function test 05/01/2023   Secondary adrenal insufficiency 05/01/2023   Vitamin D  insufficiency 05/01/2023   History of underactive thyroid  08/27/2022   Nontoxic multinodular goiter 08/27/2022   Anxiety disorder 06/26/2021   GERD (gastroesophageal reflux  disease) 06/26/2021   Hyperlipidemia 06/26/2021   Hypertension 06/26/2021   Common migraine with intractable migraine 06/05/2021   Depression with anxiety 06/01/2021   Fibromyalgia 06/01/2021   Kidney stone 06/01/2021   Migraine 06/01/2021   Thyroid  disease 06/01/2021   Essential hypertension 04/19/2021   Cardiac murmur 04/19/2021   Obesity (BMI 35.0-39.9 without comorbidity) 04/19/2021   Atypical squamous cell changes of undetermined significance (ASCUS) on vaginal cytology with positive high risk human papilloma virus (HPV) 09/11/2020   Vaginal bleeding 05/20/2019   Contracture of tendon sheath 11/26/2018   Onychomycosis due to dermatophyte 11/03/2018   Pain due to onychomycosis of toenail of left foot 02/19/2018   Drug therapy 02/02/2018   Peripheral polyneuropathy 02/02/2018   Voice hoarseness 02/02/2018   Seizure disorder (HCC) 11/13/2017   Epistaxis 03/13/2016   G-6-PD deficiency 03/13/2016   Hyperemesis gravidarum 03/13/2016   Insomnia 03/13/2016   Multiple joint pain 03/13/2016   Ovarian cyst 03/13/2016   Pharyngitis 03/13/2016   Positive ANA (antinuclear antibody) 03/13/2016   Syncope and collapse 01/01/2016   Bursitis, trochanteric 04/15/2013   Long term current use of opiate analgesic 03/12/2013   Osteoarthritis of left hip 03/12/2013   Pain syndrome, chronic 03/12/2013   Sacroiliac pain 03/12/2013   Thoracic spondylosis 03/12/2013   Laryngeal trauma 07/23/2012    ONSET DATE: 07/16/2024 (referral)   REFERRING DIAG: R53.1 (ICD-10-CM) - Weakness  THERAPY DIAG:  Muscle weakness (generalized)  Other abnormalities of gait and mobility  Unsteadiness on feet  Rationale for Evaluation and Treatment: Rehabilitation  SUBJECTIVE:                                                                                                                                                                                             SUBJECTIVE STATEMENT: Kelly Haas  Pt presents  w/SBQC. States she feels odd coming in the clinic at this time. No falls or pain. Brought her HEP from [facility] but has not been doing it.    Pt accompanied by: self, drove herself today (educated pt on driving protocol after seizures)  PERTINENT HISTORY: PMH: HTN, seizures, migraines, anxiety, depression, chronic pain syndrome, fibromyalgia, GERD,  hypothyroidism, peptic ulcer disease, G6PD deficiency, multinodular goiter, polyneuropathy, secondary adrenal insufficiency, multiple allergies, recurrent episodes of anaphylaxis, idiopathic urticaria   PAIN:  Are you having pain? Yes: NPRS scale: 0/10 currently, gets up to 8/10 Pain location: L thigh Pain description: sharp Aggravating factors: N/A Relieving factors: hot shower  PRECAUTIONS: Fall  RED FLAGS: None   WEIGHT BEARING RESTRICTIONS: No  FALLS: Has patient fallen in last 6 months? Yes. Number of falls frequently, unable to give further information   SEIZURES: Most recent one was 3 weeks ago, had a headache while driving and called her daughter who came to get her and she pulled over Lack of sleep and stress throw everything off  LIVING ENVIRONMENT: Lives with: lives alone Lives in: House/apartment Stairs: Yes: External: 2 steps; none; steps at back door, level entry at front door Has following equipment at home: Quad cane small base, Environmental consultant - 4 wheeled, Wheelchair (manual), shower chair, and bed side commode - BSC sits over her toilet  PLOF: Independent with basic ADLs, Independent with gait, Independent with transfers, and Requires assistive device for independence  PATIENT GOALS: to get off my convertible [facility]), I want to run-I know I can't run, run, I want this leg (left) to be able to hop, skip, and jump  OBJECTIVE:  Note: Objective measures were completed at Evaluation unless otherwise noted.  DIAGNOSTIC FINDINGS: None updated or relevant to this POC  COGNITION: Overall cognitive status: her  family says she will repeat stuff, possible short term memory impairments   SENSATION: Gets numbness in LLE at times, no tingling Decreased light touch sensation in LLE, tingling in distal LLE  COORDINATION: Impaired LLE  POSTURE: forward head and hunched shoulders  LOWER EXTREMITY ROM:   WFL  Active  Right Eval Left Eval  Hip flexion    Hip extension    Hip abduction    Hip adduction    Hip internal rotation    Hip external rotation    Knee flexion    Knee extension    Ankle dorsiflexion    Ankle plantarflexion    Ankle inversion    Ankle eversion     (Blank rows = not tested)  LOWER EXTREMITY MMT:    MMT Right Eval Left Eval  Hip flexion 5 4+  Hip extension    Hip abduction    Hip adduction    Hip internal rotation    Hip external rotation    Knee flexion 5 4  Knee extension 5 4  Ankle dorsiflexion 5 4  Ankle plantarflexion    Ankle inversion    Ankle eversion    (Blank rows = not tested)  BED MOBILITY:  Mod I per pt report  TRANSFERS: Sit to stand: Modified independence  Assistive device utilized: None     Stand to sit: Modified independence  Assistive device utilized: None     Chair to chair: Modified independence  Assistive device utilized: None       RAMP:  Not tested  CURB:  Not tested  STAIRS: STAIRS:  Level of Assistance: CGA  Stair Negotiation Technique: Alternating Pattern  with Bilateral Rails  Number of Stairs: 4   Height of Stairs: 6  Comments: WFL   GAIT: Findings:  Gait pattern: decreased hip/knee flexion- Left and genu recurvatum- Left Distance walked: various clinic distances Assistive device utilized: Quad cane small base and None Level of assistance: Modified independence   FUNCTIONAL TESTS:       VITALS: Vitals:  08/25/24 1536  BP: 100/64  Pulse: 77                                                                                                                                   TREATMENT:    Self-Care/Home Management Assessed vitals in LUE at rest (see above) and WNL  Ther Act  SciFit multi-peaks level 9.0 for 8 minutes using BUE/BLEs for neural priming for reciprocal movement, dynamic cardiovascular warmup and increased amplitude of stepping. RPE of 6-7/10 following activity  In // bars for improved single leg stability, facilitation of ankle strategy and functional hip strengthening:  Alt step ups w/contralateral march to 6 step, x12 reps per side. Max multimodal cues required for pt to attempt without UE support, as she reports this is her safety blanket. Pt able to perform well without UE support w/increased difficulty stepping w/LLE >RLE. CGA throughout to improve pt performance.  Single leg step ups w/12# KB hold in contralateral UE, x20 reps per side. Compensated trendelenburg noted on L side.  On rockerboard in A/P direction:  Standing w/EO and no UE support, x3 minutes. Pt frequently closing her eyes, requiring max cues to maintain EO. If pt distracted, pt able to balance well. If not distracted, pt frequently losing balance posteriorly.  Alt retro step w/no UE support, x10 reps per side. Min cues for wider BOS. Pt able to maintain conversation w/therapist throughout activity and acknowledged she has improved performance if distracted. SBA throughout.    PATIENT EDUCATION: Education details: Continue HEP from this clinic, DC [facility]P from [facility]  Person educated: Patient Education method: Explanation, Demonstration, and Verbal cues Education comprehension: verbalized understanding, returned demonstration, verbal cues required, and needs further education  HOME EXERCISE PROGRAM: Access Code: [email or web address] URL: [email or web address] Date: 08/16/2024 Prepared by: Marlon Aprille Sawhney  Exercises - Side Stepping with Resistance at Ankles and Counter Support  - 1 x daily - 7 x weekly - 3 sets - 10 reps - Backward Monster Walk with Resistance at Ankles and  Counter Support  - 1 x daily - 7 x weekly - 3 sets - 10 reps  GOALS: Goals reviewed with patient? No  SHORT TERM GOALS: Target date: 09/01/2024   Pt will be independent with initial HEP for improved strength, balance, transfers and gait. Baseline: Goal status: INITIAL  2.  Pt will improve gait velocity to at least 2.5 ft/sec for improved gait efficiency and performance at mod I level  Baseline: 2.29 ft/sec mod I with SBQC (9/24) Goal status: INITIAL  3.  Pt will improve 5 x STS to less than or equal to 16 seconds to demonstrate improved functional strength and transfer efficiency.  Baseline: 19.8 sec no UE (9/24) Goal status: INITIAL  4.  Pt will improve FGA to 22/30 for decreased fall risk  Baseline: 18/30 (9/24) Goal status: INITIAL     LONG TERM GOALS: Target date: 09/22/2024   Pt will  be independent with final HEP for improved strength, balance, transfers and gait. Baseline:  Goal status: INITIAL  2.  Pt will improve gait velocity to at least 2.75 ft/sec for improved gait efficiency and performance at mod I level  Baseline: 2.29 ft/sec mod I with SBQC (9/24) Goal status: INITIAL  3.  Pt will improve 5 x STS to less than or equal to 13 seconds to demonstrate improved functional strength and transfer efficiency.  Baseline: 19.8 sec no UE (9/24) Goal status: INITIAL  4.  Pt will improve FGA to 26/30 for decreased fall risk  Baseline: 18/30 (9/24) Goal status: INITIAL  5.  Pt will trial gait with no AD to determine if she would be safe to progress away from her [facility] Baseline:  Goal status: INITIAL    ASSESSMENT:  CLINICAL IMPRESSION: Emphasis of skilled PT session on facilitation of ankle strategy, single leg stability, reciprocal coordination and functional BLE strength. Pt brought in her HEP from [facility], but was too low level for pt so recommended she DC it. Pt requires max multimodal encouraging cues to perform challenging tasks as she prefers to  have BUE support despite not needing it. Educated pt on importance of facilitating hip/ankle strategy for balance recovery rather than rely on BUE support, but pt reports holding onto things is her safety blanket. Pt has improved performance if distracted, so will progress to addition of dual tasks in PT. Continue POC.    OBJECTIVE IMPAIRMENTS: Abnormal gait, decreased balance, decreased cognition, decreased coordination, decreased mobility, difficulty walking, decreased strength, impaired perceived functional ability, impaired sensation, postural dysfunction, and pain.   ACTIVITY LIMITATIONS: carrying, lifting, bending, squatting, and stairs  PARTICIPATION LIMITATIONS: driving and community activity  PERSONAL FACTORS: Time since onset of injury/illness/exacerbation and 3+ comorbidities:   HTN, seizures, migraines, anxiety, depression, chronic pain syndrome, fibromyalgia, GERD, hypothyroidism, peptic ulcer disease, G6PD deficiency, multinodular goiter, polyneuropathy, secondary adrenal insufficiency, multiple allergies, recurrent episodes of anaphylaxis, idiopathic urticaria are also affecting patient's functional outcome.   REHAB POTENTIAL: Good  CLINICAL DECISION MAKING: Evolving/moderate complexity  EVALUATION COMPLEXITY: Moderate  PLAN:  PT FREQUENCY: 2x/week  PT DURATION: 6 weeks  PLANNED INTERVENTIONS: 97164- PT Re-evaluation, 97750- Physical Performance Testing, 97110-Therapeutic exercises, 97530- Therapeutic activity, [id number]- Neuromuscular re-education, 97535- Self Care, [id number]- Manual therapy, [id number]- Gait training, [phone]- Orthotic Initial, [phone]- Orthotic/Prosthetic subsequent, [phone]- Aquatic Therapy, [phone]- Electrical stimulation (manual), [phone] (1-2 muscles), [id number] (3+ muscles)- Dry Needling, Patient/Family education, Balance training, Stair training, Taping, Joint mobilization, Spinal manipulation, Cognitive remediation, DME instructions, Cryotherapy, and Moist heat  PLAN FOR  NEXT SESSION: continue education about not driving following seizures, trial gait with no AD, work on LLE NMR and strengthening (hip and knee flexion, address genu recurvatum), what HEP does she have from [facility]?, estim or Bioness? Work on stability without UE support, ankle/hip strategy. Dual tasking    Marlon BRAVO Keaisha Sublette, PT, DPT 08/25/2024, 4:15 PM

## 2024-08-26 ENCOUNTER — Ambulatory Visit (INDEPENDENT_AMBULATORY_CARE_PROVIDER_SITE_OTHER): Admitting: Physician Assistant

## 2024-08-26 ENCOUNTER — Encounter: Payer: Self-pay | Admitting: Physician Assistant

## 2024-08-26 VITALS — BP 120/76 | HR 83 | Ht 65.0 in | Wt 228.2 lb

## 2024-08-26 DIAGNOSIS — R1085 Abdominal pain of multiple sites: Secondary | ICD-10-CM

## 2024-08-26 DIAGNOSIS — R1084 Generalized abdominal pain: Secondary | ICD-10-CM

## 2024-08-26 DIAGNOSIS — R1013 Epigastric pain: Secondary | ICD-10-CM

## 2024-08-26 DIAGNOSIS — K581 Irritable bowel syndrome with constipation: Secondary | ICD-10-CM

## 2024-08-26 DIAGNOSIS — R935 Abnormal findings on diagnostic imaging of other abdominal regions, including retroperitoneum: Secondary | ICD-10-CM | POA: Diagnosis not present

## 2024-08-26 DIAGNOSIS — K3184 Gastroparesis: Secondary | ICD-10-CM | POA: Diagnosis not present

## 2024-08-26 DIAGNOSIS — K219 Gastro-esophageal reflux disease without esophagitis: Secondary | ICD-10-CM

## 2024-08-26 MED ORDER — LUBIPROSTONE 8 MCG PO CAPS: 8.0000 ug | ORAL_CAPSULE | Freq: Two times a day (BID) | ORAL | 0 refills | Status: AC

## 2024-08-26 MED ORDER — NA SULFATE-K SULFATE-MG SULF 17.5-3.13-1.6 GM/177ML PO SOLN: 1.0000 | Freq: Once | ORAL | 0 refills | Status: AC

## 2024-08-26 NOTE — Patient Instructions (Addendum)
 Please do the following: Purchase a bottle of Miralax over the counter as well as a box of 5 mg dulcolax tablets. Take 4 dulcolax tablets. Wait 1 hour. You will then drink 6-8 capfuls of Miralax mixed in an adequate amount of water/juice/gatorade (you may choose which of these liquids to drink) over the next 2-3 hours. You should expect results within 1 to 6 hours after completing the bowel purge. Go to the er if you have severe AB pain, can not pass gas or stool in over 12 hours, can not hold down any food.   Do the bowel purge above and then start amitiza 8 mcg twice a day with food Can consider increasing this if needed Do not take this with the linzess   You have been scheduled for a colonoscopy. Please follow written instructions given to you at your visit today.   If you use inhalers (even only as needed), please bring them with you on the day of your procedure.  DO NOT TAKE 7 DAYS PRIOR TO TEST- Trulicity (dulaglutide) Ozempic, Wegovy (semaglutide) Mounjaro (tirzepatide) Bydureon Bcise (exanatide extended release)  DO NOT TAKE 1 DAY PRIOR TO YOUR TEST Rybelsus (semaglutide) Adlyxin (lixisenatide) Victoza (liraglutide) Byetta (exanatide) ___________________________________________________________________________    Gastroparesis Gastroparesis is a condition in which food takes longer than normal to empty from the stomach.  This condition is also known as delayed gastric emptying. It is usually a long-term (chronic) condition.  What are the signs or symptoms? Symptoms of this condition include: Feeling full after eating very little or a loss of appetite. Nausea, vomiting, or heartburn. Bloating of your abdomen. Inconsistent blood sugar (glucose) levels on blood tests. Unexplained weight loss. Acid from the stomach coming up into the esophagus (gastroesophageal reflux). Sudden tightening (spasm) of the stomach, which can be painful. Symptoms may come and go. Some people  may not notice any symptoms.  What increases the risk? You are more likely to develop this condition if: You have certain disorders or diseases. These may include: An endocrine disorder. An eating disorder. Amyloidosis. Scleroderma. Parkinson's disease. Multiple sclerosis. Cancer or infection of the stomach or the vagus nerve. You have had surgery on your stomach or vagus nerve. You take certain medicines. You are female.  Things you can do: Please do small frequent meals like 4-6 meals a day.  Eat and drink liquids at separate times.  Avoid high fiber foods, cook your vegetables, avoid high fat food.  Suggest spreading protein throughout the day (greek yogurt, glucerna, soft meat, milk, eggs) Choose soft foods that you can mash with a fork When you are more symptomatic, change to pureed foods foods and liquids.  Consider reading Living well with Gastroparesis by [employer] [facility] Check out this link to a diet online [email or web address]

## 2024-08-26 NOTE — Progress Notes (Signed)
 08/26/2024 Kelly Haas [id number] 14-Jan-1971  Referring provider: Jama Chow, MD Primary GI doctor: Dr. Charlanne  ASSESSMENT AND PLAN:  AB pain periumbilical/lower AB 2 episodes of constant periumblical pain several weeks around, AB pain, no radiation, had fever one of the episodes, no nausea, vomiting, change in bowel habits 07/04/2017 colonoscopy neg except for small posterior anal fissure.  06/18/2024 CT APW Thick-walled versus poorly distended distal rectosigmoid colon.  06/28/2024 KUB large colonic stool burden 06/28/2024 sed rate and CRP negative, fecal calprotectin never completed Linzess  72 ( not effective) Linzess  145 ( diarrhea) - Patient is due 2028 colonoscopy, had abnormal CT scan 06/2024 with rectosigmoid distension likely from constipation but will schedule colonoscopy to evaluate since there is no improvement -do bowel purge and try amitiza 8 mcg BID - consider pelvic floor PT, information given - consider treatment for SIBO or testing  GERD  Status post cholecystectomy  EGD 10/2021 unremarkable.  Negative CT 04/22/2019, neg CTA 03/12/2019 05/15/2023 GES delayed gastric emptying Likely functional dyspepsia/abdominal wall pain/gastroparesis.  On Protonix  40 mg daily   IBS-C, recently with occasional diarrhea 07/04/2017 colonoscopy neg except for small posterior anal fissure.  Linzess  72 ( not effective) Linzess  145 ( diarrhea)    Mild gastroparesis 05/15/2023 GES delayed gastric emptying Patient is not diabetic Discussed diet with small, frequent meals, soft diet with the patient.  Will need to optimize bowel regimen. Can consider trial of motegrity.  Morbid obesity  Body mass index is 37.98 kg/m.  -Patient has been advised to make an attempt to improve diet and exercise patterns to aid in weight loss. -Recommended diet heavy in fruits and veggies and low in animal meats, cheeses, and dairy products, appropriate calorie intake  Patient Care Team: Kelly Chow,  MD as PCP - General (Internal Medicine) Kelly Haas, Aldona, MD as Consulting Physician (Internal Medicine)  HISTORY OF PRESENT ILLNESS: 53 y.o. female with a past medical history of  of migraine headaches, seizure disorder, G6PD deficiency, multinodular goiter, polyneuropathy, secondary adrenal insufficiency, multiple allergies, recurrent episodes of anaphylaxis, idiopathic urticaria  and others listed below presents for evaluation of AB pain.   Patient was last seen in the office by myself 06/28/2024.  At that visit she had unremarkable CBC, kidney liver, CRP and sed rate.  KUB did show stool throughout the colon without obstruction.  Started on Linzess  after bowel purge.  Discussed the use of AI scribe software for clinical note transcription with the patient, who gave verbal consent to proceed.  History of Present Illness   Kelly Haas is a 53 year old female with constipation and gastroparesis who presents with abdominal pain and bowel movement irregularities.  She experiences significant cramping abdominal pain primarily in the lower abdomen, which worsens with heavy meals. To manage the discomfort, she opts for lighter meals or skips meals altogether. Consecutive days of heavy meals exacerbate the cramping.  She uses Linzess  for constipation, initially trying 72 mcg without effect, and then 145 mcg, which caused excessive bowel movements similar to a colonoscopy cleanse. She has used Linzess  145 mcg approximately three times, providing slight relief of abdominal discomfort but causing dehydration. Eating spaghetti can induce bowel movements, resulting in a mix of diarrhea and regular bowel movements.  A CT scan in the past was reported to show either a thickened wall or a poorly distended distal rectosigmoid colon, and an x-ray showed a large colonic stool burden. Her last colonoscopy was in 2018. She has made dietary changes, eliminating  foods like watermelon and apples, and incorporating  foods she can tolerate. She is frustrated with the cost of dietary changes, such as expensive bread.  No nausea, vomiting, or blood in the stool, but she reports frequent belching, especially after drinking water. She has a history of gastroparesis, confirmed by a gastric emptying study in 2024, which showed delayed stomach emptying. She follows a diet to manage this condition, focusing on small, frequent meals and avoiding high-fiber and high-fat foods.     She  reports that she has never smoked. She has never used smokeless tobacco. She reports that she does not drink alcohol and does not use drugs.  RELEVANT GI HISTORY, IMAGING AND LABS: Results   LABS Sedimentation Rate: Negative C-reactive Protein: Negative  RADIOLOGY Abdominal X-ray: Large colonic stool burden (06/28/2024) Abdominal CT: Potentially thickened wall versus poorly distended distal rectosigmoid colon  DIAGNOSTIC Gastric Emptying Study: Delayed gastric emptying (2024)      CBC    Component Value Date/Time   WBC 6.5 06/28/2024 1438   RBC 4.12 06/28/2024 1438   HGB 12.6 06/28/2024 1438   HGB 13.9 12/07/2020 1315   HCT 38.3 06/28/2024 1438   HCT 41.6 12/07/2020 1315   PLT 309.0 06/28/2024 1438   PLT 363 12/07/2020 1315   MCV 93.0 06/28/2024 1438   MCV 92 12/07/2020 1315   MCH 29.8 02/04/2022 1304   MCHC 32.8 06/28/2024 1438   RDW 13.0 06/28/2024 1438   RDW 11.6 (L) 12/07/2020 1315   LYMPHSABS 2.6 06/28/2024 1438   LYMPHSABS 2.3 12/07/2020 1315   MONOABS 0.3 06/28/2024 1438   EOSABS 0.1 06/28/2024 1438   EOSABS 0.0 12/07/2020 1315   BASOSABS 0.0 06/28/2024 1438   BASOSABS 0.0 12/07/2020 1315   Recent Labs    06/28/24 1438  HGB 12.6    CMP     Component Value Date/Time   NA 143 06/28/2024 1438   NA 143 12/07/2020 1315   K 3.9 06/28/2024 1438   CL 111 06/28/2024 1438   CO2 26 06/28/2024 1438   GLUCOSE 106 (H) 06/28/2024 1438   BUN 9 06/28/2024 1438   BUN 11 12/07/2020 1315   CREATININE 0.96  06/28/2024 1438   CALCIUM 9.0 06/28/2024 1438   PROT 6.7 06/28/2024 1438   PROT 6.8 12/07/2020 1315   ALBUMIN 4.1 06/28/2024 1438   ALBUMIN 4.0 12/07/2020 1315   AST 10 06/28/2024 1438   ALT 9 06/28/2024 1438   ALKPHOS 81 06/28/2024 1438   BILITOT 0.4 06/28/2024 1438   BILITOT 0.3 12/07/2020 1315   GFRNONAA >60 02/04/2022 1304   GFRAA 75 12/07/2020 1315      Latest Ref Rng & Units 06/28/2024    2:38 PM 10/25/2021   11:11 AM 12/07/2020    1:15 PM  Hepatic Function  Total Protein 6.0 - 8.3 g/dL 6.7  7.0  6.8   Albumin 3.5 - 5.2 g/dL 4.1  4.0  4.0   AST 0 - 37 U/L 10  15  20    ALT 0 - 35 U/L 9  10  19    Alk Phosphatase 39 - 117 U/L 81  101  86   Total Bilirubin 0.2 - 1.2 mg/dL 0.4  0.5  0.3       Current Medications:    Current Outpatient Medications (Cardiovascular):    amLODipine (NORVASC) 5 MG tablet, Take 5 mg by mouth daily.   EPINEPHrine 0.3 mg/0.3 mL IJ SOAJ injection, Inject 0.3 mg into the muscle as needed for anaphylaxis.  nitroGLYCERIN  (NITROSTAT ) 0.4 MG SL tablet, Place 0.4 mg under the tongue every 5 (five) minutes as needed for chest pain.  Current Outpatient Medications (Respiratory):    albuterol (VENTOLIN HFA) 108 (90 Base) MCG/ACT inhaler, Inhale 2 puffs into the lungs every 4 (four) hours as needed.   fluticasone (FLONASE) 50 MCG/ACT nasal spray, Place 2 sprays into both nostrils daily.   loratadine (CLARITIN) 10 MG tablet, Take 10 mg by mouth daily.   XOLAIR 150 MG/ML prefilled syringe, Inject 150 mg into the skin every 30 (thirty) days. 2 injections  Current Outpatient Medications (Analgesics):    rizatriptan  (MAXALT ) 10 MG tablet, Take 5 mg by mouth as needed.   SUMAtriptan (IMITREX) 100 MG tablet, Take 100 mg by mouth every 2 (two) hours as needed for migraine.   traMADol (ULTRAM) 50 MG tablet, Take 50 mg by mouth.   Current Outpatient Medications (Other):    busPIRone (BUSPAR) 30 MG tablet, Take 30 mg by mouth 2 (two) times daily.   clonazePAM  (KLONOPIN) 0.5 MG tablet, Take 0.5 mg by mouth 2 (two) times daily as needed for anxiety.   cyclobenzaprine (FLEXERIL) 10 MG tablet, Take 1 tablet (10 mg total) by mouth 3 (three) times daily as needed for Muscle spasms.   dicyclomine  (BENTYL ) 20 MG tablet, Take 1 tablet (20 mg total) by mouth 4 (four) times daily -  before meals and at bedtime.   FLUoxetine (PROZAC) 20 MG capsule, Take 20 mg by mouth daily.   hydrOXYzine (ATARAX) 50 MG tablet, Take 50 mg by mouth at bedtime.   LamoTRIgine  200 MG TB24 24 hour tablet, TAKE 2 TABLETS (400 MG TOTAL) BY MOUTH DAILY.   linaclotide  (LINZESS ) 145 MCG CAPS capsule, Take 1 capsule (145 mcg total) by mouth daily before breakfast.   lubiprostone (AMITIZA) 8 MCG capsule, Take 1 capsule (8 mcg total) by mouth 2 (two) times daily with a meal.   LYRICA 50 MG capsule, Take 1 capsule by mouth 2 (two) times daily.    meclizine (ANTIVERT) 25 MG tablet, Take 25 mg by mouth as needed for dizziness or nausea.   Na Sulfate-K Sulfate-Mg Sulfate concentrate (SUPREP) 17.5-3.13-1.6 GM/177ML SOLN, Take 1 kit (354 mLs total) by mouth once for 1 dose.   ondansetron  (ZOFRAN -ODT) 8 MG disintegrating tablet, Take 1 tablet (8 mg total) by mouth every 8 (eight) hours as needed for nausea or vomiting.   pantoprazole  (PROTONIX ) 40 MG tablet, TAKE 1 TABLET EVERY DAY   pregabalin (LYRICA) 75 MG capsule, Take 75 mg by mouth 2 (two) times daily.   tamsulosin (FLOMAX) 0.4 MG CAPS capsule, Take 0.4 mg by mouth as needed.   topiramate  (TOPAMAX ) 100 MG tablet, Take 1.5 tablets (150 mg total) by mouth at bedtime.   traZODone (DESYREL) 100 MG tablet, Take 200 mg by mouth at bedtime as needed.  Medical History:  Past Medical History:  Diagnosis Date   Abnormal cortisol level 05/01/2023   Abnormal thyroid  function test 05/01/2023   Anxiety disorder    Atypical squamous cell changes of undetermined significance (ASCUS) on vaginal cytology with positive high risk human papilloma virus (HPV)  09/11/2020   Formatting of this note might be different from the original. Many years ago before hyst cryo for abnormal pap 2021 ASCUS pap with pos HR HPV. Colpo-possible lesion- appears excoriated at the R cuff. Brushing shows inflammation with slightly positive  Ki67 index.10 days of metrogel. Recheck 3 weeks. Repeat pap in 6 mos. 03/2021 Nl pap with pos HR  HPV   Bursitis, trochanteric 04/15/2013   Cardiac murmur 04/19/2021   Common migraine with intractable migraine 06/05/2021   Contracture of tendon sheath 11/26/2018   Depression with anxiety    Drug therapy 02/02/2018   Epistaxis 03/13/2016   Essential hypertension 04/19/2021   Fibromyalgia    G-6-PD deficiency 03/13/2016   GERD (gastroesophageal reflux disease)    History of adrenal insufficiency 07/14/2023   History of underactive thyroid  08/27/2022   Hyperemesis gravidarum 03/13/2016   Hyperlipidemia    Hypertension    Insomnia    Kidney stone    Laryngeal trauma 07/23/2012   Long term current use of opiate analgesic 03/12/2013   Migraine    Multiple allergies 07/14/2023   Multiple joint pain 03/13/2016   Multiple myeloma (HCC)    Nontoxic multinodular goiter 08/27/2022   Obesity (BMI 35.0-39.9 without comorbidity) 04/19/2021   Onychomycosis due to dermatophyte 11/03/2018   Osteoarthritis of left hip 03/12/2013   Other reduced mobility 07/15/2023   Ovarian cyst 03/13/2016   Pain due to onychomycosis of toenail of left foot 02/19/2018   Pain syndrome, chronic 03/12/2013   Peripheral polyneuropathy 02/02/2018   Pharyngitis 03/13/2016   Positive ANA (antinuclear antibody) 03/13/2016   Renal insufficiency    Sacroiliac pain 03/12/2013   Secondary adrenal insufficiency 05/01/2023   Seizure disorder (HCC) 11/13/2017   Syncope and collapse 01/01/2016   Thoracic spondylosis 03/12/2013   Thyroid  disease    Urticaria    Vaginal bleeding 05/20/2019   Formatting of this note might be different from the original.  Prior hyst  ~2005 for benign indications Bleeding 05/2019 Postcoital bleeding reported 08/2020   Vitamin D  insufficiency 05/01/2023   Voice hoarseness 02/02/2018   Allergies:  Allergies  Allergen Reactions   Bee Venom Anaphylaxis   Iodinated Contrast Media Anaphylaxis   Ibuprofen Hives, Nausea And Vomiting and Rash    Other reaction(s): Vomiting (intolerance)   Acetaminophen      Pass out   Aminolevulinate Derivatives     unknown   Aspirin Nausea And Vomiting   Aspirin-Acetaminophen -Caffeine     unknown   Dapsone     unknown   Dimercaprol     unknown   Methylcellulose     Pass out    Naproxen    Nitrofuran Derivatives     unknown   Phenazopyridine     unknown   Primaquine     unknown   Sulfa Antibiotics    Toluidine Blue     unknown   Celebrex [Celecoxib] Hives and Rash   Latex Rash   Other Rash and Other (See Comments)    Artichoke- rash around lips, ticklish throat   Oxycodone  Rash   Phenergan  [Promethazine  Hcl] Rash     Surgical History:  She  has a past surgical history that includes Abdominal hysterectomy; Tonsillectomy and adenoidectomy; Cholecystectomy; Appendectomy; Esophagogastroduodenoscopy (08/29/2017); and Toe Surgery (Left). Family History:  Her family history includes Cancer in her mother; Colon cancer in her maternal grandfather; Diabetes in her mother; Heart attack in her father and maternal grandmother; Hypertension in her maternal grandfather, maternal grandmother, mother, paternal grandfather, and paternal grandmother; Kidney cancer in her mother; Lung cancer in her maternal grandfather and maternal grandmother; Pancreatic cancer in her maternal grandfather; Prostate cancer in her maternal grandfather; Seizures in her father; Stroke in her maternal grandmother; Thyroid  cancer in her maternal aunt and sister.  REVIEW OF SYSTEMS  : All other systems reviewed and negative except where noted in the History of Present Illness.  PHYSICAL EXAM: BP 120/76 (BP Location:  Left Arm, Patient Position: Sitting, Cuff Size: Normal)   Pulse 83   Ht 5' 5 (1.651 m)   Wt 228 lb 4 oz (103.5 kg)   BMI 37.98 kg/m  Physical Exam   GENERAL APPEARANCE: Well nourished, in no apparent distress HEENT: No cervical lymphadenopathy, unremarkable thyroid , sclerae anicteric, conjunctiva pink RESPIRATORY: Respiratory effort normal, BS equal bilateral without rales, rhonchi, wheezing CARDIO: RRR with no MRGs, peripheral pulses intact ABDOMEN: Soft, non distended, active bowel sounds in all 4 quadrants, no tenderness to palpation, no rebound, no mass appreciated RECTAL: declines MUSCULOSKELETAL: Full ROM, normal gait, without edema SKIN: Dry, intact without rashes or lesions. No jaundice. NEURO: Alert, oriented, no focal deficits PSYCH: Cooperative, normal mood and affect.      Alan JONELLE Coombs, PA-C 3:54 PM

## 2024-08-27 ENCOUNTER — Ambulatory Visit: Admitting: Physical Therapy

## 2024-08-27 VITALS — BP 100/62 | HR 77

## 2024-08-27 DIAGNOSIS — M6281 Muscle weakness (generalized): Secondary | ICD-10-CM

## 2024-08-27 DIAGNOSIS — R2689 Other abnormalities of gait and mobility: Secondary | ICD-10-CM

## 2024-08-27 DIAGNOSIS — R2681 Unsteadiness on feet: Secondary | ICD-10-CM

## 2024-08-27 NOTE — Therapy (Signed)
 OUTPATIENT PHYSICAL THERAPY NEURO TREATMENT   Patient Name: Kelly Haas MRN: [id number] DOB:[birth year], 53 y.o., female Today's Date: 08/27/2024   PCP: Kelly Chow, MD REFERRING PROVIDER: Jama Chow, MD  END OF SESSION:  PT End of Session - 08/27/24 0939     Visit Number 4    Number of Visits 13   with eval   Date for Recertification  10/06/24   to allow for scheduling delays   Authorization Type Medicare    PT Start Time [phone]   Previous pt session ran late   PT Stop Time 1020    PT Time Calculation (min) 43 min    Equipment Utilized During Treatment --    Activity Tolerance Patient tolerated treatment well    Behavior During Therapy [facility] for tasks assessed/performed             Past Medical History:  Diagnosis Date   Abnormal cortisol level 05/01/2023   Abnormal thyroid  function test 05/01/2023   Anxiety disorder    Atypical squamous cell changes of undetermined significance (ASCUS) on vaginal cytology with positive high risk human papilloma virus (HPV) 09/11/2020   Formatting of this note might be different from the original. Many years ago before hyst cryo for abnormal pap 2021 ASCUS pap with pos HR HPV. Colpo-possible lesion- appears excoriated at the R cuff. Brushing shows inflammation with slightly positive  Ki67 index.10 days of metrogel. Recheck 3 weeks. Repeat pap in 6 mos. 03/2021 Nl pap with pos HR HPV   Bursitis, trochanteric 04/15/2013   Cardiac murmur 04/19/2021   Common migraine with intractable migraine 06/05/2021   Contracture of tendon sheath 11/26/2018   Depression with anxiety    Drug therapy 02/02/2018   Epistaxis 03/13/2016   Essential hypertension 04/19/2021   Fibromyalgia    G-6-PD deficiency 03/13/2016   GERD (gastroesophageal reflux disease)    History of adrenal insufficiency 07/14/2023   History of underactive thyroid  08/27/2022   Hyperemesis gravidarum 03/13/2016   Hyperlipidemia    Hypertension    Insomnia    Kidney stone     Laryngeal trauma 07/23/2012   Long term current use of opiate analgesic 03/12/2013   Migraine    Multiple allergies 07/14/2023   Multiple joint pain 03/13/2016   Multiple myeloma (HCC)    Nontoxic multinodular goiter 08/27/2022   Obesity (BMI 35.0-39.9 without comorbidity) 04/19/2021   Onychomycosis due to dermatophyte 11/03/2018   Osteoarthritis of left hip 03/12/2013   Other reduced mobility 07/15/2023   Ovarian cyst 03/13/2016   Pain due to onychomycosis of toenail of left foot 02/19/2018   Pain syndrome, chronic 03/12/2013   Peripheral polyneuropathy 02/02/2018   Pharyngitis 03/13/2016   Positive ANA (antinuclear antibody) 03/13/2016   Renal insufficiency    Sacroiliac pain 03/12/2013   Secondary adrenal insufficiency 05/01/2023   Seizure disorder (HCC) 11/13/2017   Syncope and collapse 01/01/2016   Thoracic spondylosis 03/12/2013   Thyroid  disease    Urticaria    Vaginal bleeding 05/20/2019   Formatting of this note might be different from the original.  Prior hyst ~2005 for benign indications Bleeding 05/2019 Postcoital bleeding reported 08/2020   Vitamin D  insufficiency 05/01/2023   Voice hoarseness 02/02/2018   Past Surgical History:  Procedure Laterality Date   ABDOMINAL HYSTERECTOMY     APPENDECTOMY     CHOLECYSTECTOMY     ESOPHAGOGASTRODUODENOSCOPY  08/29/2017   Mild gastritis. Stauts post empiric esophageal dilitation.    TOE SURGERY Left    removed toe nail  TONSILLECTOMY AND ADENOIDECTOMY     Patient Active Problem List   Diagnosis Date Noted   Urticaria    Other reduced mobility 07/15/2023   History of adrenal insufficiency 07/14/2023   Multiple allergies 07/14/2023   Abnormal cortisol level 05/01/2023   Abnormal thyroid  function test 05/01/2023   Secondary adrenal insufficiency 05/01/2023   Vitamin D  insufficiency 05/01/2023   History of underactive thyroid  08/27/2022   Nontoxic multinodular goiter 08/27/2022   Anxiety disorder 06/26/2021   GERD  (gastroesophageal reflux disease) 06/26/2021   Hyperlipidemia 06/26/2021   Hypertension 06/26/2021   Common migraine with intractable migraine 06/05/2021   Depression with anxiety 06/01/2021   Fibromyalgia 06/01/2021   Kidney stone 06/01/2021   Migraine 06/01/2021   Thyroid  disease 06/01/2021   Essential hypertension 04/19/2021   Cardiac murmur 04/19/2021   Obesity (BMI 35.0-39.9 without comorbidity) 04/19/2021   Atypical squamous cell changes of undetermined significance (ASCUS) on vaginal cytology with positive high risk human papilloma virus (HPV) 09/11/2020   Vaginal bleeding 05/20/2019   Contracture of tendon sheath 11/26/2018   Onychomycosis due to dermatophyte 11/03/2018   Pain due to onychomycosis of toenail of left foot 02/19/2018   Drug therapy 02/02/2018   Peripheral polyneuropathy 02/02/2018   Voice hoarseness 02/02/2018   Seizure disorder (HCC) 11/13/2017   Epistaxis 03/13/2016   G-6-PD deficiency 03/13/2016   Hyperemesis gravidarum 03/13/2016   Insomnia 03/13/2016   Multiple joint pain 03/13/2016   Ovarian cyst 03/13/2016   Pharyngitis 03/13/2016   Positive ANA (antinuclear antibody) 03/13/2016   Syncope and collapse 01/01/2016   Bursitis, trochanteric 04/15/2013   Long term current use of opiate analgesic 03/12/2013   Osteoarthritis of left hip 03/12/2013   Pain syndrome, chronic 03/12/2013   Sacroiliac pain 03/12/2013   Thoracic spondylosis 03/12/2013   Laryngeal trauma 07/23/2012    ONSET DATE: 07/16/2024 (referral)   REFERRING DIAG: R53.1 (ICD-10-CM) - Weakness  THERAPY DIAG:  Muscle weakness (generalized)  Other abnormalities of gait and mobility  Unsteadiness on feet  Rationale for Evaluation and Treatment: Rehabilitation  SUBJECTIVE:                                                                                                                                                                                             SUBJECTIVE  STATEMENT: Kelly Haas  Pt presents w/SBQC. States her LLE feels like it is asleep. Started this morning. No falls.    Pt accompanied by: self, drove herself today (educated pt on driving protocol after seizures)  PERTINENT HISTORY: PMH: HTN, seizures, migraines, anxiety, depression, chronic pain syndrome, fibromyalgia, GERD, hypothyroidism, peptic ulcer disease, G6PD deficiency, multinodular goiter,  polyneuropathy, secondary adrenal insufficiency, multiple allergies, recurrent episodes of anaphylaxis, idiopathic urticaria   PAIN:  Are you having pain? Yes: NPRS scale: 0/10 currently, gets up to 8/10 Pain location: L thigh Pain description: sharp Aggravating factors: N/A Relieving factors: hot shower  PRECAUTIONS: Fall  RED FLAGS: None   WEIGHT BEARING RESTRICTIONS: No  FALLS: Has patient fallen in last 6 months? Yes. Number of falls frequently, unable to give further information   SEIZURES: Most recent one was 3 weeks ago, had a headache while driving and called her daughter who came to get her and she pulled over Lack of sleep and stress throw everything off  LIVING ENVIRONMENT: Lives with: lives alone Lives in: House/apartment Stairs: Yes: External: 2 steps; none; steps at back door, level entry at front door Has following equipment at home: Quad cane small base, Environmental consultant - 4 wheeled, Wheelchair (manual), shower chair, and bed side commode - BSC sits over her toilet  PLOF: Independent with basic ADLs, Independent with gait, Independent with transfers, and Requires assistive device for independence  PATIENT GOALS: to get off my convertible [facility]), I want to run-I know I can't run, run, I want this leg (left) to be able to hop, skip, and jump  OBJECTIVE:  Note: Objective measures were completed at Evaluation unless otherwise noted.  DIAGNOSTIC FINDINGS: None updated or relevant to this POC  COGNITION: Overall cognitive status: her family says she will repeat stuff,  possible short term memory impairments   SENSATION: Gets numbness in LLE at times, no tingling Decreased light touch sensation in LLE, tingling in distal LLE  COORDINATION: Impaired LLE  POSTURE: forward head and hunched shoulders  LOWER EXTREMITY ROM:   WFL  Active  Right Eval Left Eval  Hip flexion    Hip extension    Hip abduction    Hip adduction    Hip internal rotation    Hip external rotation    Knee flexion    Knee extension    Ankle dorsiflexion    Ankle plantarflexion    Ankle inversion    Ankle eversion     (Blank rows = not tested)  LOWER EXTREMITY MMT:    MMT Right Eval Left Eval  Hip flexion 5 4+  Hip extension    Hip abduction    Hip adduction    Hip internal rotation    Hip external rotation    Knee flexion 5 4  Knee extension 5 4  Ankle dorsiflexion 5 4  Ankle plantarflexion    Ankle inversion    Ankle eversion    (Blank rows = not tested)  BED MOBILITY:  Mod I per pt report  TRANSFERS: Sit to stand: Modified independence  Assistive device utilized: None     Stand to sit: Modified independence  Assistive device utilized: None     Chair to chair: Modified independence  Assistive device utilized: None       RAMP:  Not tested  CURB:  Not tested  STAIRS: STAIRS:  Level of Assistance: CGA  Stair Negotiation Technique: Alternating Pattern  with Bilateral Rails  Number of Stairs: 4   Height of Stairs: 6  Comments: WFL   GAIT: Findings:  Gait pattern: decreased hip/knee flexion- Left and genu recurvatum- Left Distance walked: various clinic distances Assistive device utilized: Quad cane small base and None Level of assistance: Modified independence   FUNCTIONAL TESTS:       VITALS: Vitals:   08/27/24 0941  BP: 100/62  Pulse: 77  TREATMENT:   Self-Care/Home Management Assessed  vitals in LUE at rest (see above) and WNL  Ther Act  SciFit multi-peaks level 10.0 for 8 minutes using BUE/BLEs for neural priming for reciprocal movement, dynamic cardiovascular warmup and increased amplitude of stepping.  Supine glute bridges w/legs elevated on green theraball, 2x12 reps, for improved posterior chain strength, core stability and weightbearing tolerance on LLE.  Side plank w/clamshell, 3x5 reps per side, for improved core stability and functional hip strength. Increased difficulty when lying on L side. Added to HEP (see bolded below)  Bear crawl holds, 6x6-15s holds, for improved core stability and isometric quad strength. Added to HEP (see bolded below)    PATIENT EDUCATION: Education details: Updates to HEP Person educated: Patient Education method: Programmer, multimedia, Facilities manager, Verbal cues, and Handouts Education comprehension: verbalized understanding, returned demonstration, verbal cues required, and needs further education  HOME EXERCISE PROGRAM: Access Code: [email or web address] URL: [email or web address] Date: 08/16/2024 Prepared by: Marlon Datra Clary  Exercises - Side Stepping with Resistance at Ankles and Counter Support  - 1 x daily - 7 x weekly - 3 sets - 10 reps - Backward Monster Walk with Resistance at Ankles and Counter Support  - 1 x daily - 7 x weekly - 3 sets - 10 reps - Side Plank with Clam  - 1 x daily - 7 x weekly - 3 sets - 5-8 reps - Bear Hold   - 1 x daily - 7 x weekly - 3-5 sets - 10-15 second hold  GOALS: Goals reviewed with patient? No  SHORT TERM GOALS: Target date: 09/01/2024   Pt will be independent with initial HEP for improved strength, balance, transfers and gait. Baseline: Goal status: INITIAL  2.  Pt will improve gait velocity to at least 2.5 ft/sec for improved gait efficiency and performance at mod I level  Baseline: 2.29 ft/sec mod I with SBQC (9/24) Goal status: INITIAL  3.  Pt will improve 5 x STS to less than or equal  to 16 seconds to demonstrate improved functional strength and transfer efficiency.  Baseline: 19.8 sec no UE (9/24) Goal status: INITIAL  4.  Pt will improve FGA to 22/30 for decreased fall risk  Baseline: 18/30 (9/24) Goal status: INITIAL     LONG TERM GOALS: Target date: 09/22/2024   Pt will be independent with final HEP for improved strength, balance, transfers and gait. Baseline:  Goal status: INITIAL  2.  Pt will improve gait velocity to at least 2.75 ft/sec for improved gait efficiency and performance at mod I level  Baseline: 2.29 ft/sec mod I with SBQC (9/24) Goal status: INITIAL  3.  Pt will improve 5 x STS to less than or equal to 13 seconds to demonstrate improved functional strength and transfer efficiency.  Baseline: 19.8 sec no UE (9/24) Goal status: INITIAL  4.  Pt will improve FGA to 26/30 for decreased fall risk  Baseline: 18/30 (9/24) Goal status: INITIAL  5.  Pt will trial gait with no AD to determine if she would be safe to progress away from her [facility] Baseline:  Goal status: INITIAL    ASSESSMENT:  CLINICAL IMPRESSION: Emphasis of skilled PT session on core stability, LLE strengthening and weightbearing tolerance on LLE. Pt reported LLE being numb at beginning of session but regained full sensation by end of session. Pt continues to be limited by functional LLE weakness and over reliance on RLE but benefited from use of tactile cues to facilitate weightbearing on LLE this date. Continue POC.  OBJECTIVE IMPAIRMENTS: Abnormal gait, decreased balance, decreased cognition, decreased coordination, decreased mobility, difficulty walking, decreased strength, impaired perceived functional ability, impaired sensation, postural dysfunction, and pain.   ACTIVITY LIMITATIONS: carrying, lifting, bending, squatting, and stairs  PARTICIPATION LIMITATIONS: driving and community activity  PERSONAL FACTORS: Time since onset of injury/illness/exacerbation and 3+  comorbidities:   HTN, seizures, migraines, anxiety, depression, chronic pain syndrome, fibromyalgia, GERD, hypothyroidism, peptic ulcer disease, G6PD deficiency, multinodular goiter, polyneuropathy, secondary adrenal insufficiency, multiple allergies, recurrent episodes of anaphylaxis, idiopathic urticaria are also affecting patient's functional outcome.   REHAB POTENTIAL: Good  CLINICAL DECISION MAKING: Evolving/moderate complexity  EVALUATION COMPLEXITY: Moderate  PLAN:  PT FREQUENCY: 2x/week  PT DURATION: 6 weeks  PLANNED INTERVENTIONS: 97164- PT Re-evaluation, 97750- Physical Performance Testing, 97110-Therapeutic exercises, 97530- Therapeutic activity, [id number]- Neuromuscular re-education, 97535- Self Care, [id number]- Manual therapy, [id number]- Gait training, [phone]- Orthotic Initial, [phone]- Orthotic/Prosthetic subsequent, [phone]- Aquatic Therapy, [phone]- Electrical stimulation (manual), [phone] (1-2 muscles), [id number] (3+ muscles)- Dry Needling, Patient/Family education, Balance training, Stair training, Taping, Joint mobilization, Spinal manipulation, Cognitive remediation, DME instructions, Cryotherapy, and Moist heat  PLAN FOR NEXT SESSION: continue education about not driving following seizures, trial gait with no AD, work on LLE NMR and strengthening (hip and knee flexion, address genu recurvatum), what HEP does she have from [facility]?, estim or Bioness? Work on stability without UE support, ankle/hip strategy. Dual tasking    Ercia Crisafulli E Tashira Torre, PT, DPT 08/27/2024, 10:25 AM

## 2024-08-30 ENCOUNTER — Ambulatory Visit: Admitting: Physical Therapy

## 2024-08-30 VITALS — BP 95/62 | HR 90

## 2024-08-30 DIAGNOSIS — M6281 Muscle weakness (generalized): Secondary | ICD-10-CM | POA: Diagnosis not present

## 2024-08-30 DIAGNOSIS — R2689 Other abnormalities of gait and mobility: Secondary | ICD-10-CM

## 2024-08-30 DIAGNOSIS — R2681 Unsteadiness on feet: Secondary | ICD-10-CM

## 2024-08-30 NOTE — Therapy (Signed)
 OUTPATIENT PHYSICAL THERAPY NEURO TREATMENT   Patient Name: Kelly Haas MRN: [id number] DOB:1971-08-30, 53 y.o., female Today's Date: 08/30/2024   PCP: Jama Chow, MD REFERRING PROVIDER: Jama Chow, MD  END OF SESSION:  PT End of Session - 08/30/24 1103     Visit Number 5    Number of Visits 13   with eval   Date for Recertification  10/06/24   to allow for scheduling delays   Authorization Type Medicare    PT Start Time 1101    PT Stop Time 1146    PT Time Calculation (min) 45 min    Equipment Utilized During Treatment Gait belt    Activity Tolerance Patient tolerated treatment well;Patient limited by fatigue    Behavior During Therapy WFL for tasks assessed/performed             Past Medical History:  Diagnosis Date   Abnormal cortisol level 05/01/2023   Abnormal thyroid  function test 05/01/2023   Anxiety disorder    Atypical squamous cell changes of undetermined significance (ASCUS) on vaginal cytology with positive high risk human papilloma virus (HPV) 09/11/2020   Formatting of this note might be different from the original. Many years ago before hyst cryo for abnormal pap 2021 ASCUS pap with pos HR HPV. Colpo-possible lesion- appears excoriated at the R cuff. Brushing shows inflammation with slightly positive  Ki67 index.10 days of metrogel. Recheck 3 weeks. Repeat pap in 6 mos. 03/2021 Nl pap with pos HR HPV   Bursitis, trochanteric 04/15/2013   Cardiac murmur 04/19/2021   Common migraine with intractable migraine 06/05/2021   Contracture of tendon sheath 11/26/2018   Depression with anxiety    Drug therapy 02/02/2018   Epistaxis 03/13/2016   Essential hypertension 04/19/2021   Fibromyalgia    G-6-PD deficiency 03/13/2016   GERD (gastroesophageal reflux disease)    History of adrenal insufficiency 07/14/2023   History of underactive thyroid  08/27/2022   Hyperemesis gravidarum 03/13/2016   Hyperlipidemia    Hypertension    Insomnia    Kidney stone     Laryngeal trauma 07/23/2012   Long term current use of opiate analgesic 03/12/2013   Migraine    Multiple allergies 07/14/2023   Multiple joint pain 03/13/2016   Multiple myeloma (HCC)    Nontoxic multinodular goiter 08/27/2022   Obesity (BMI 35.0-39.9 without comorbidity) 04/19/2021   Onychomycosis due to dermatophyte 11/03/2018   Osteoarthritis of left hip 03/12/2013   Other reduced mobility 07/15/2023   Ovarian cyst 03/13/2016   Pain due to onychomycosis of toenail of left foot 02/19/2018   Pain syndrome, chronic 03/12/2013   Peripheral polyneuropathy 02/02/2018   Pharyngitis 03/13/2016   Positive ANA (antinuclear antibody) 03/13/2016   Renal insufficiency    Sacroiliac pain 03/12/2013   Secondary adrenal insufficiency 05/01/2023   Seizure disorder (HCC) 11/13/2017   Syncope and collapse 01/01/2016   Thoracic spondylosis 03/12/2013   Thyroid  disease    Urticaria    Vaginal bleeding 05/20/2019   Formatting of this note might be different from the original.  Prior hyst ~2005 for benign indications Bleeding 05/2019 Postcoital bleeding reported 08/2020   Vitamin D  insufficiency 05/01/2023   Voice hoarseness 02/02/2018   Past Surgical History:  Procedure Laterality Date   ABDOMINAL HYSTERECTOMY     APPENDECTOMY     CHOLECYSTECTOMY     ESOPHAGOGASTRODUODENOSCOPY  08/29/2017   Mild gastritis. Stauts post empiric esophageal dilitation.    TOE SURGERY Left    removed toe nail   TONSILLECTOMY  AND ADENOIDECTOMY     Patient Active Problem List   Diagnosis Date Noted   Urticaria    Other reduced mobility 07/15/2023   History of adrenal insufficiency 07/14/2023   Multiple allergies 07/14/2023   Abnormal cortisol level 05/01/2023   Abnormal thyroid  function test 05/01/2023   Secondary adrenal insufficiency 05/01/2023   Vitamin D  insufficiency 05/01/2023   History of underactive thyroid  08/27/2022   Nontoxic multinodular goiter 08/27/2022   Anxiety disorder 06/26/2021   GERD  (gastroesophageal reflux disease) 06/26/2021   Hyperlipidemia 06/26/2021   Hypertension 06/26/2021   Common migraine with intractable migraine 06/05/2021   Depression with anxiety 06/01/2021   Fibromyalgia 06/01/2021   Kidney stone 06/01/2021   Migraine 06/01/2021   Thyroid  disease 06/01/2021   Essential hypertension 04/19/2021   Cardiac murmur 04/19/2021   Obesity (BMI 35.0-39.9 without comorbidity) 04/19/2021   Atypical squamous cell changes of undetermined significance (ASCUS) on vaginal cytology with positive high risk human papilloma virus (HPV) 09/11/2020   Vaginal bleeding 05/20/2019   Contracture of tendon sheath 11/26/2018   Onychomycosis due to dermatophyte 11/03/2018   Pain due to onychomycosis of toenail of left foot 02/19/2018   Drug therapy 02/02/2018   Peripheral polyneuropathy 02/02/2018   Voice hoarseness 02/02/2018   Seizure disorder (HCC) 11/13/2017   Epistaxis 03/13/2016   G-6-PD deficiency 03/13/2016   Hyperemesis gravidarum 03/13/2016   Insomnia 03/13/2016   Multiple joint pain 03/13/2016   Ovarian cyst 03/13/2016   Pharyngitis 03/13/2016   Positive ANA (antinuclear antibody) 03/13/2016   Syncope and collapse 01/01/2016   Bursitis, trochanteric 04/15/2013   Long term current use of opiate analgesic 03/12/2013   Osteoarthritis of left hip 03/12/2013   Pain syndrome, chronic 03/12/2013   Sacroiliac pain 03/12/2013   Thoracic spondylosis 03/12/2013   Laryngeal trauma 07/23/2012    ONSET DATE: 07/16/2024 (referral)   REFERRING DIAG: R53.1 (ICD-10-CM) - Weakness  THERAPY DIAG:  Muscle weakness (generalized)  Other abnormalities of gait and mobility  Unsteadiness on feet  Rationale for Evaluation and Treatment: Rehabilitation  SUBJECTIVE:                                                                                                                                                                                             SUBJECTIVE  STATEMENT: Rin  Pt presents w/SBQC. States she is fatigued today following going to the fair this weekend. Walked a lot on [employer], alternated using and holding her cane. No falls. No pain today    Pt accompanied by: self, drove herself today (educated pt on driving protocol after seizures)  PERTINENT HISTORY: PMH: HTN, seizures, migraines,  anxiety, depression, chronic pain syndrome, fibromyalgia, GERD, hypothyroidism, peptic ulcer disease, G6PD deficiency, multinodular goiter, polyneuropathy, secondary adrenal insufficiency, multiple allergies, recurrent episodes of anaphylaxis, idiopathic urticaria   PAIN:  Are you having pain? Yes: NPRS scale: 0/10 currently, gets up to 8/10 Pain location: L thigh Pain description: sharp Aggravating factors: N/A Relieving factors: hot shower  PRECAUTIONS: Fall  RED FLAGS: None   WEIGHT BEARING RESTRICTIONS: No  FALLS: Has patient fallen in last 6 months? Yes. Number of falls frequently, unable to give further information   SEIZURES: Most recent one was 3 weeks ago, had a headache while driving and called her daughter who came to get her and she pulled over Lack of sleep and stress throw everything off  LIVING ENVIRONMENT: Lives with: lives alone Lives in: House/apartment Stairs: Yes: External: 2 steps; none; steps at back door, level entry at front door Has following equipment at home: Quad cane small base, Environmental consultant - 4 wheeled, Wheelchair (manual), shower chair, and bed side commode - BSC sits over her toilet  PLOF: Independent with basic ADLs, Independent with gait, Independent with transfers, and Requires assistive device for independence  PATIENT GOALS: to get off my convertible [facility]), I want to run-I know I can't run, run, I want this leg (left) to be able to hop, skip, and jump  OBJECTIVE:  Note: Objective measures were completed at Evaluation unless otherwise noted.  DIAGNOSTIC FINDINGS: None updated or  relevant to this POC  COGNITION: Overall cognitive status: her family says she will repeat stuff, possible short term memory impairments   SENSATION: Gets numbness in LLE at times, no tingling Decreased light touch sensation in LLE, tingling in distal LLE  COORDINATION: Impaired LLE  POSTURE: forward head and hunched shoulders  LOWER EXTREMITY ROM:   WFL  Active  Right Eval Left Eval  Hip flexion    Hip extension    Hip abduction    Hip adduction    Hip internal rotation    Hip external rotation    Knee flexion    Knee extension    Ankle dorsiflexion    Ankle plantarflexion    Ankle inversion    Ankle eversion     (Blank rows = not tested)  LOWER EXTREMITY MMT:    MMT Right Eval Left Eval  Hip flexion 5 4+  Hip extension    Hip abduction    Hip adduction    Hip internal rotation    Hip external rotation    Knee flexion 5 4  Knee extension 5 4  Ankle dorsiflexion 5 4  Ankle plantarflexion    Ankle inversion    Ankle eversion    (Blank rows = not tested)  BED MOBILITY:  Mod I per pt report  TRANSFERS: Sit to stand: Modified independence  Assistive device utilized: None     Stand to sit: Modified independence  Assistive device utilized: None     Chair to chair: Modified independence  Assistive device utilized: None       RAMP:  Not tested  CURB:  Not tested  STAIRS: STAIRS:  Level of Assistance: CGA  Stair Negotiation Technique: Alternating Pattern  with Bilateral Rails  Number of Stairs: 4   Height of Stairs: 6  Comments: WFL   GAIT: Findings:  Gait pattern: decreased hip/knee flexion- Left and genu recurvatum- Left Distance walked: various clinic distances Assistive device utilized: Quad cane small base and None Level of assistance: Modified independence   FUNCTIONAL TESTS:  VITALS: Vitals:   08/30/24 1106  BP: 95/62  Pulse: 90                                                                                                                                   TREATMENT:   Self-Care/Home Management/Ther Act  Assessed vitals in RUE at rest (see above) and WNL for pt Elliptical level 1 for 8 minutes (4 fwd and 4 retro) for cardiovascular warmup and functional BLE strength. At minute 7, pt attempting to sit on elliptical due to fatigue and pain in BLEs, requiring max multimodal cues and assist for pt to step off elliptical safely and sit down. Pt refusing to sit in chair initially, requiring max encouraging cues to sit. After several minutes of seated rest, pt asking to get back on elliptical to finish her minute, but recommended against pt getting back onto elliptical today. Pt reports she stopped and refused to sit because of pain in her hamstrings, but otherwise was okay.  Seated hamstring stretch, x2 minutes, for pain modulation. At end of stretch, pt reported feeling fine    On mat table, quadruped bird dogs, x10 reps per side, for improved proximal stability and core strength. Increased difficulty stabilizing on LLE, requiring CGA for stability.  B-stance RDLs w/12# KB, x12 reps per side, for improved single leg stability and posterior chain strength. Pr requesting to perform while braced against mat table, but informed pt that would not be appropriate as she needs to work on her ankle stability for balance. Pt able to perform w/SBA only, requiring min verbal cues to avoid locking out knees throughout activity.    PATIENT EDUCATION: Education details: Continue HEP, Psychiatrist w/lifting  Person educated: Patient Education method: Explanation, Demonstration, and Verbal cues Education comprehension: verbalized understanding, returned demonstration, verbal cues required, and needs further education  HOME EXERCISE PROGRAM: Access Code: [email or web address] URL: [email or web address] Date: 08/16/2024 Prepared by: Marlon Kerri Asche  Exercises - Side Stepping with Resistance at Ankles and Counter Support   - 1 x daily - 7 x weekly - 3 sets - 10 reps - Backward Monster Walk with Resistance at Ankles and Counter Support  - 1 x daily - 7 x weekly - 3 sets - 10 reps - Side Plank with Clam  - 1 x daily - 7 x weekly - 3 sets - 5-8 reps - Bear Hold   - 1 x daily - 7 x weekly - 3-5 sets - 10-15 second hold  GOALS: Goals reviewed with patient? No  SHORT TERM GOALS: Target date: 09/01/2024   Pt will be independent with initial HEP for improved strength, balance, transfers and gait. Baseline: Goal status: INITIAL  2.  Pt will improve gait velocity to at least 2.5 ft/sec for improved gait efficiency and performance at mod I level  Baseline: 2.29 ft/sec mod I with SBQC (9/24) Goal status: INITIAL  3.  Pt will improve 5 x  STS to less than or equal to 16 seconds to demonstrate improved functional strength and transfer efficiency.  Baseline: 19.8 sec no UE (9/24) Goal status: INITIAL  4.  Pt will improve FGA to 22/30 for decreased fall risk  Baseline: 18/30 (9/24) Goal status: INITIAL     LONG TERM GOALS: Target date: 09/22/2024   Pt will be independent with final HEP for improved strength, balance, transfers and gait. Baseline:  Goal status: INITIAL  2.  Pt will improve gait velocity to at least 2.75 ft/sec for improved gait efficiency and performance at mod I level  Baseline: 2.29 ft/sec mod I with SBQC (9/24) Goal status: INITIAL  3.  Pt will improve 5 x STS to less than or equal to 13 seconds to demonstrate improved functional strength and transfer efficiency.  Baseline: 19.8 sec no UE (9/24) Goal status: INITIAL  4.  Pt will improve FGA to 26/30 for decreased fall risk  Baseline: 18/30 (9/24) Goal status: INITIAL  5.  Pt will trial gait with no AD to determine if she would be safe to progress away from her [facility] Baseline:  Goal status: INITIAL    ASSESSMENT:  CLINICAL IMPRESSION: Emphasis of skilled PT session on improved BLE strength, proximal stability and facilitation  of ankle strategy for balance. Pt impulsive on elliptical this date, suddenly attempting to sit on machine and refusing to sit in chair due to pain in BLEs. Pt later stating she was just trying to rest, but informed her of need to communicate with therapist if she needs rest breaks as it is unsafe to sit on equipment. Pt tolerated remainder of session well but does require encouragement to try challenging tasks without BUE support due to fear of falling. Continue POC.    OBJECTIVE IMPAIRMENTS: Abnormal gait, decreased balance, decreased cognition, decreased coordination, decreased mobility, difficulty walking, decreased strength, impaired perceived functional ability, impaired sensation, postural dysfunction, and pain.   ACTIVITY LIMITATIONS: carrying, lifting, bending, squatting, and stairs  PARTICIPATION LIMITATIONS: driving and community activity  PERSONAL FACTORS: Time since onset of injury/illness/exacerbation and 3+ comorbidities:   HTN, seizures, migraines, anxiety, depression, chronic pain syndrome, fibromyalgia, GERD, hypothyroidism, peptic ulcer disease, G6PD deficiency, multinodular goiter, polyneuropathy, secondary adrenal insufficiency, multiple allergies, recurrent episodes of anaphylaxis, idiopathic urticaria are also affecting patient's functional outcome.   REHAB POTENTIAL: Good  CLINICAL DECISION MAKING: Evolving/moderate complexity  EVALUATION COMPLEXITY: Moderate  PLAN:  PT FREQUENCY: 2x/week  PT DURATION: 6 weeks  PLANNED INTERVENTIONS: 97164- PT Re-evaluation, 97750- Physical Performance Testing, 97110-Therapeutic exercises, 97530- Therapeutic activity, [id number]- Neuromuscular re-education, 97535- Self Care, [id number]- Manual therapy, [id number]- Gait training, [phone]- Orthotic Initial, [phone]- Orthotic/Prosthetic subsequent, [phone]- Aquatic Therapy, [phone]- Electrical stimulation (manual), [phone] (1-2 muscles), [id number] (3+ muscles)- Dry Needling, Patient/Family education, Balance training,  Stair training, Taping, Joint mobilization, Spinal manipulation, Cognitive remediation, DME instructions, Cryotherapy, and Moist heat  PLAN FOR NEXT SESSION: Goals. continue education about not driving following seizures, trial gait with no AD, work on LLE NMR and strengthening (hip and knee flexion, address genu recurvatum), what HEP does she have from [facility]?, estim or [facility]? Work on stability without UE support, ankle/hip strategy. Dual tasking    Marlon BRAVO Alianis Trimmer, PT, DPT 08/30/2024, 12:00 PM

## 2024-09-01 ENCOUNTER — Ambulatory Visit: Admitting: Physical Therapy

## 2024-09-01 VITALS — BP 112/69 | HR 83

## 2024-09-01 DIAGNOSIS — M6281 Muscle weakness (generalized): Secondary | ICD-10-CM | POA: Diagnosis not present

## 2024-09-01 DIAGNOSIS — R2689 Other abnormalities of gait and mobility: Secondary | ICD-10-CM

## 2024-09-01 DIAGNOSIS — R2681 Unsteadiness on feet: Secondary | ICD-10-CM

## 2024-09-01 NOTE — Therapy (Signed)
 OUTPATIENT PHYSICAL THERAPY NEURO TREATMENT   Patient Name: Kelly Haas MRN: [id number] DOB:September 15, 1971, 53 y.o., female Today's Date: 09/01/2024   PCP: Jama Chow, MD REFERRING PROVIDER: Jama Chow, MD  END OF SESSION:  PT End of Session - 09/01/24 0925     Visit Number 6    Number of Visits 13   with eval   Date for Recertification  10/06/24   to allow for scheduling delays   Authorization Type Medicare    PT Start Time 0924    PT Stop Time 1010    PT Time Calculation (min) 46 min    Equipment Utilized During Treatment Gait belt    Activity Tolerance Patient tolerated treatment well;Patient limited by fatigue    Behavior During Therapy [facility] for tasks assessed/performed             Past Medical History:  Diagnosis Date   Abnormal cortisol level 05/01/2023   Abnormal thyroid  function test 05/01/2023   Anxiety disorder    Atypical squamous cell changes of undetermined significance (ASCUS) on vaginal cytology with positive high risk human papilloma virus (HPV) 09/11/2020   Formatting of this note might be different from the original. Many years ago before hyst cryo for abnormal pap 2021 ASCUS pap with pos HR HPV. Colpo-possible lesion- appears excoriated at the R cuff. Brushing shows inflammation with slightly positive  Ki67 index.10 days of metrogel. Recheck 3 weeks. Repeat pap in 6 mos. 03/2021 Nl pap with pos HR HPV   Bursitis, trochanteric 04/15/2013   Cardiac murmur 04/19/2021   Common migraine with intractable migraine 06/05/2021   Contracture of tendon sheath 11/26/2018   Depression with anxiety    Drug therapy 02/02/2018   Epistaxis 03/13/2016   Essential hypertension 04/19/2021   Fibromyalgia    G-6-PD deficiency 03/13/2016   GERD (gastroesophageal reflux disease)    History of adrenal insufficiency 07/14/2023   History of underactive thyroid  08/27/2022   Hyperemesis gravidarum 03/13/2016   Hyperlipidemia    Hypertension    Insomnia    Kidney stone     Laryngeal trauma 07/23/2012   Long term current use of opiate analgesic 03/12/2013   Migraine    Multiple allergies 07/14/2023   Multiple joint pain 03/13/2016   Multiple myeloma (HCC)    Nontoxic multinodular goiter 08/27/2022   Obesity (BMI 35.0-39.9 without comorbidity) 04/19/2021   Onychomycosis due to dermatophyte 11/03/2018   Osteoarthritis of left hip 03/12/2013   Other reduced mobility 07/15/2023   Ovarian cyst 03/13/2016   Pain due to onychomycosis of toenail of left foot 02/19/2018   Pain syndrome, chronic 03/12/2013   Peripheral polyneuropathy 02/02/2018   Pharyngitis 03/13/2016   Positive ANA (antinuclear antibody) 03/13/2016   Renal insufficiency    Sacroiliac pain 03/12/2013   Secondary adrenal insufficiency 05/01/2023   Seizure disorder (HCC) 11/13/2017   Syncope and collapse 01/01/2016   Thoracic spondylosis 03/12/2013   Thyroid  disease    Urticaria    Vaginal bleeding 05/20/2019   Formatting of this note might be different from the original.  Prior hyst ~2005 for benign indications Bleeding 05/2019 Postcoital bleeding reported 08/2020   Vitamin D  insufficiency 05/01/2023   Voice hoarseness 02/02/2018   Past Surgical History:  Procedure Laterality Date   ABDOMINAL HYSTERECTOMY     APPENDECTOMY     CHOLECYSTECTOMY     ESOPHAGOGASTRODUODENOSCOPY  08/29/2017   Mild gastritis. Stauts post empiric esophageal dilitation.    TOE SURGERY Left    removed toe nail   TONSILLECTOMY  AND ADENOIDECTOMY     Patient Active Problem List   Diagnosis Date Noted   Urticaria    Other reduced mobility 07/15/2023   History of adrenal insufficiency 07/14/2023   Multiple allergies 07/14/2023   Abnormal cortisol level 05/01/2023   Abnormal thyroid  function test 05/01/2023   Secondary adrenal insufficiency 05/01/2023   Vitamin D  insufficiency 05/01/2023   History of underactive thyroid  08/27/2022   Nontoxic multinodular goiter 08/27/2022   Anxiety disorder 06/26/2021   GERD  (gastroesophageal reflux disease) 06/26/2021   Hyperlipidemia 06/26/2021   Hypertension 06/26/2021   Common migraine with intractable migraine 06/05/2021   Depression with anxiety 06/01/2021   Fibromyalgia 06/01/2021   Kidney stone 06/01/2021   Migraine 06/01/2021   Thyroid  disease 06/01/2021   Essential hypertension 04/19/2021   Cardiac murmur 04/19/2021   Obesity (BMI 35.0-39.9 without comorbidity) 04/19/2021   Atypical squamous cell changes of undetermined significance (ASCUS) on vaginal cytology with positive high risk human papilloma virus (HPV) 09/11/2020   Vaginal bleeding 05/20/2019   Contracture of tendon sheath 11/26/2018   Onychomycosis due to dermatophyte 11/03/2018   Pain due to onychomycosis of toenail of left foot 02/19/2018   Drug therapy 02/02/2018   Peripheral polyneuropathy 02/02/2018   Voice hoarseness 02/02/2018   Seizure disorder (HCC) 11/13/2017   Epistaxis 03/13/2016   G-6-PD deficiency 03/13/2016   Hyperemesis gravidarum 03/13/2016   Insomnia 03/13/2016   Multiple joint pain 03/13/2016   Ovarian cyst 03/13/2016   Pharyngitis 03/13/2016   Positive ANA (antinuclear antibody) 03/13/2016   Syncope and collapse 01/01/2016   Bursitis, trochanteric 04/15/2013   Long term current use of opiate analgesic 03/12/2013   Osteoarthritis of left hip 03/12/2013   Pain syndrome, chronic 03/12/2013   Sacroiliac pain 03/12/2013   Thoracic spondylosis 03/12/2013   Laryngeal trauma 07/23/2012    ONSET DATE: 07/16/2024 (referral)   REFERRING DIAG: R53.1 (ICD-10-CM) - Weakness  THERAPY DIAG:  Muscle weakness (generalized)  Other abnormalities of gait and mobility  Unsteadiness on feet  Rationale for Evaluation and Treatment: Rehabilitation  SUBJECTIVE:                                                                                                                                                                                             SUBJECTIVE  STATEMENT: Kelly Haas  Pt presents w/[facility]. States she is doing well, did some therapy yesterday and can tell her quads are weak. No falls.   Pt accompanied by: self, drove herself today   PERTINENT HISTORY: PMH: HTN, seizures, migraines, anxiety, depression, chronic pain syndrome, fibromyalgia, GERD, hypothyroidism, peptic ulcer disease, G6PD deficiency, multinodular goiter, polyneuropathy, secondary adrenal  insufficiency, multiple allergies, recurrent episodes of anaphylaxis, idiopathic urticaria   PAIN:  Are you having pain? Yes: NPRS scale: 0/10 currently, gets up to 8/10 Pain location: L thigh Pain description: sharp Aggravating factors: N/A Relieving factors: hot shower  PRECAUTIONS: Fall  RED FLAGS: None   WEIGHT BEARING RESTRICTIONS: No  FALLS: Has patient fallen in last 6 months? Yes. Number of falls frequently, unable to give further information   SEIZURES: Most recent one was 3 weeks ago, had a headache while driving and called her daughter who came to get her and she pulled over Lack of sleep and stress throw everything off  LIVING ENVIRONMENT: Lives with: lives alone Lives in: House/apartment Stairs: Yes: External: 2 steps; none; steps at back door, level entry at front door Has following equipment at home: Quad cane small base, Environmental consultant - 4 wheeled, Wheelchair (manual), shower chair, and bed side commode - BSC sits over her toilet  PLOF: Independent with basic ADLs, Independent with gait, Independent with transfers, and Requires assistive device for independence  PATIENT GOALS: to get off my convertible [facility]), I want to run-I know I can't run, run, I want this leg (left) to be able to hop, skip, and jump  OBJECTIVE:  Note: Objective measures were completed at Evaluation unless otherwise noted.  DIAGNOSTIC FINDINGS: None updated or relevant to this POC  COGNITION: Overall cognitive status: her family says she will repeat stuff, possible short term memory  impairments   SENSATION: Gets numbness in LLE at times, no tingling Decreased light touch sensation in LLE, tingling in distal LLE  COORDINATION: Impaired LLE  POSTURE: forward head and hunched shoulders  LOWER EXTREMITY ROM:   WFL  Active  Right Eval Left Eval  Hip flexion    Hip extension    Hip abduction    Hip adduction    Hip internal rotation    Hip external rotation    Knee flexion    Knee extension    Ankle dorsiflexion    Ankle plantarflexion    Ankle inversion    Ankle eversion     (Blank rows = not tested)  LOWER EXTREMITY MMT:    MMT Right Eval Left Eval  Hip flexion 5 4+  Hip extension    Hip abduction    Hip adduction    Hip internal rotation    Hip external rotation    Knee flexion 5 4  Knee extension 5 4  Ankle dorsiflexion 5 4  Ankle plantarflexion    Ankle inversion    Ankle eversion    (Blank rows = not tested)  BED MOBILITY:  Mod I per pt report  TRANSFERS: Sit to stand: Modified independence  Assistive device utilized: None     Stand to sit: Modified independence  Assistive device utilized: None     Chair to chair: Modified independence  Assistive device utilized: None       RAMP:  Not tested  CURB:  Not tested  STAIRS: STAIRS:  Level of Assistance: CGA  Stair Negotiation Technique: Alternating Pattern  with Bilateral Rails  Number of Stairs: 4   Height of Stairs: 6  Comments: WFL   GAIT: Findings:  Gait pattern: decreased hip/knee flexion- Left and genu recurvatum- Left Distance walked: various clinic distances Assistive device utilized: Quad cane small base and None Level of assistance: Modified independence   FUNCTIONAL TESTS:    [facility] PT Assessment - 09/01/24 0950       Ambulation/Gait   Gait velocity 32.8'  over 12.06s = 2.72 ft/s w/[facility]      Standardized Balance Assessment   Five times sit to stand comments  14.12s   no UE support     Functional Gait  Assessment   Gait assessed  Yes    Gait Level  Surface Walks 20 ft, slow speed, abnormal gait pattern, evidence for imbalance or deviates 10-15 in outside of the 12 in walkway width. Requires more than 7 sec to ambulate 20 ft.   7.56s   Change in Gait Speed Able to smoothly change walking speed without loss of balance or gait deviation. Deviate no more than 6 in outside of the 12 in walkway width.    Gait with Horizontal Head Turns Performs head turns smoothly with slight change in gait velocity (eg, minor disruption to smooth gait path), deviates 6-10 in outside 12 in walkway width, or uses an assistive device.    Gait with Vertical Head Turns Performs task with slight change in gait velocity (eg, minor disruption to smooth gait path), deviates 6 - 10 in outside 12 in walkway width or uses assistive device            VITALS: Vitals:   09/01/24 0929  BP: 112/69  Pulse: 83                                                                                                                                 TREATMENT:   Self-Care/Home Management  Assessed vitals in LUE at rest (see above) and WNL for pt  Ther Act Discussed importance of nutrition for muscle growth, energy and metabolism. Pt reports she has not been eating, will go for multiple days and not eat anything. Pt states this is due to her being overweight and wanting to get off medications by being smaller. Pt reports her weight continues to fluctuate and she has lost 2lbs total in 1 year. Pt only willing to start drinking a protein shake once per day and was not receptive to therapist's education or concern. Will reassess next session.   Physical Performance   [facility] PT Assessment - 09/01/24 0950       Ambulation/Gait   Gait velocity 32.8' over 12.06s = 2.72 ft/s w/[facility]      Standardized Balance Assessment   Five times sit to stand comments  14.12s   no UE support     Functional Gait  Assessment   Gait assessed  Yes    Gait Level Surface Walks 20 ft, slow speed, abnormal  gait pattern, evidence for imbalance or deviates 10-15 in outside of the 12 in walkway width. Requires more than 7 sec to ambulate 20 ft.   7.56s   Change in Gait Speed Able to smoothly change walking speed without loss of balance or gait deviation. Deviate no more than 6 in outside of the 12 in walkway width.    Gait with Horizontal Head Turns Performs head turns smoothly  with slight change in gait velocity (eg, minor disruption to smooth gait path), deviates 6-10 in outside 12 in walkway width, or uses an assistive device.    Gait with Vertical Head Turns Performs task with slight change in gait velocity (eg, minor disruption to smooth gait path), deviates 6 - 10 in outside 12 in walkway width or uses assistive device           PATIENT EDUCATION: Education details: Continue HEP, goal results so far, see ther act  Person educated: Patient Education method: Explanation, Demonstration, and Verbal cues Education comprehension: verbalized understanding, returned demonstration, verbal cues required, and needs further education  HOME EXERCISE PROGRAM: Access Code: [email or web address] URL: [email or web address] Date: 08/16/2024 Prepared by: Marlon Zenna Traister  Exercises - Side Stepping with Resistance at Ankles and Counter Support  - 1 x daily - 7 x weekly - 3 sets - 10 reps - Backward Monster Walk with Resistance at Ankles and Counter Support  - 1 x daily - 7 x weekly - 3 sets - 10 reps - Side Plank with Clam  - 1 x daily - 7 x weekly - 3 sets - 5-8 reps - Bear Hold   - 1 x daily - 7 x weekly - 3-5 sets - 10-15 second hold  GOALS: Goals reviewed with patient? No  SHORT TERM GOALS: Target date: 09/01/2024   Pt will be independent with initial HEP for improved strength, balance, transfers and gait. Baseline: Goal status: MET  2.  Pt will improve gait velocity to at least 2.5 ft/sec for improved gait efficiency and performance at mod I level  Baseline: 2.29 ft/sec mod I with [facility]  (9/24); 2.72 ft/s w/[facility] (10/15) Goal status: MET  3.  Pt will improve 5 x STS to less than or equal to 16 seconds to demonstrate improved functional strength and transfer efficiency.  Baseline: 19.8 sec no UE (9/24); 14.12s no UE (10/15) Goal status: MET  4.  Pt will improve FGA to 22/30 for decreased fall risk  Baseline: 18/30 (9/24);  Goal status: INITIAL     LONG TERM GOALS: Target date: 09/22/2024   Pt will be independent with final HEP for improved strength, balance, transfers and gait. Baseline:  Goal status: INITIAL  2.  Pt will improve gait velocity to at least 3.0 ft/sec for improved gait efficiency and performance at mod I level  Baseline: 2.29 ft/sec mod I with [facility] (9/24); 2.72 ft/s w/[facility] (10/15) Goal status: REVISED   3.  Pt will improve 5 x STS to less than or equal to 13 seconds to demonstrate improved functional strength and transfer efficiency.  Baseline: 19.8 sec no UE (9/24); 14.12s no UE (10/15) Goal status: INITIAL  4.  Pt will improve FGA to 26/30 for decreased fall risk  Baseline: 18/30 (9/24) Goal status: INITIAL  5.  Pt will trial gait with no AD to determine if she would be safe to progress away from her [facility] Baseline:  Goal status: INITIAL    ASSESSMENT:  CLINICAL IMPRESSION: Emphasis of skilled PT session on STG assessment and education regarding nutrition. Pt has met 3 STGs so far, improving her gait speed and 5x STS above goal level, indicative of improved functional BLE strength and confidence w/mobility. Therapist very concerned regarding pt's report of not eating for multiple days at a time and when she does eat, will eat something small to hold her over. Pt reports this has been going on for about a year due to being told that she  could stop taking some medications if she lost weight. Pt reports this is why she became weak on elliptical earlier this week. Provided therapeutic listening as able and education regarding importance of  nutrition, but pt not receptive to receiving help. Pt was agreeable to start drinking one protein shake per day. Continue POC.    OBJECTIVE IMPAIRMENTS: Abnormal gait, decreased balance, decreased cognition, decreased coordination, decreased mobility, difficulty walking, decreased strength, impaired perceived functional ability, impaired sensation, postural dysfunction, and pain.   ACTIVITY LIMITATIONS: carrying, lifting, bending, squatting, and stairs  PARTICIPATION LIMITATIONS: driving and community activity  PERSONAL FACTORS: Time since onset of injury/illness/exacerbation and 3+ comorbidities:   HTN, seizures, migraines, anxiety, depression, chronic pain syndrome, fibromyalgia, GERD, hypothyroidism, peptic ulcer disease, G6PD deficiency, multinodular goiter, polyneuropathy, secondary adrenal insufficiency, multiple allergies, recurrent episodes of anaphylaxis, idiopathic urticaria are also affecting patient's functional outcome.   REHAB POTENTIAL: Good  CLINICAL DECISION MAKING: Evolving/moderate complexity  EVALUATION COMPLEXITY: Moderate  PLAN:  PT FREQUENCY: 2x/week  PT DURATION: 6 weeks  PLANNED INTERVENTIONS: [id number]- PT Re-evaluation, 97750- Physical Performance Testing, 97110-Therapeutic exercises, 97530- Therapeutic activity, [id number]- Neuromuscular re-education, 97535- Self Care, [id number]- Manual therapy, [id number]- Gait training, [phone]- Orthotic Initial, [id number]- Orthotic/Prosthetic subsequent, [phone]- Aquatic Therapy, [phone]- Electrical stimulation (manual), [phone] (1-2 muscles), [id number] (3+ muscles)- Dry Needling, Patient/Family education, Balance training, Stair training, Taping, Joint mobilization, Spinal manipulation, Cognitive remediation, DME instructions, Cryotherapy, and Moist heat  PLAN FOR NEXT SESSION: Finish FGA. continue education about not driving following seizures, trial gait with no AD, work on LLE NMR and strengthening (hip and knee flexion, address genu recurvatum), what HEP  does she have from [facility]?, estim or [place]? Work on stability without UE support, ankle/hip strategy. Dual tasking    Girl Schissler E Jonay Hitchcock, PT, DPT 09/01/2024, 11:12 AM

## 2024-09-08 ENCOUNTER — Ambulatory Visit: Admitting: Physical Therapy

## 2024-09-08 VITALS — BP 115/69 | HR 78

## 2024-09-08 DIAGNOSIS — M6281 Muscle weakness (generalized): Secondary | ICD-10-CM | POA: Diagnosis not present

## 2024-09-08 DIAGNOSIS — R2681 Unsteadiness on feet: Secondary | ICD-10-CM

## 2024-09-08 DIAGNOSIS — R2689 Other abnormalities of gait and mobility: Secondary | ICD-10-CM

## 2024-09-08 NOTE — Therapy (Signed)
 OUTPATIENT PHYSICAL THERAPY NEURO TREATMENT   Patient Name: Kelly Haas MRN: [id number] DOB:1971/05/01, 53 y.o., female Today's Date: 09/08/2024   PCP: Jama Chow, MD REFERRING PROVIDER: Jama Chow, MD  END OF SESSION:  PT End of Session - 09/08/24 0932     Visit Number 7    Number of Visits 13   with eval   Date for Recertification  10/06/24   to allow for scheduling delays   Authorization Type Medicare    PT Start Time 0931    PT Stop Time 1014    PT Time Calculation (min) 43 min    Equipment Utilized During Treatment Gait belt    Activity Tolerance Patient tolerated treatment well;Patient limited by lethargy    Behavior During Therapy Flat affect              Past Medical History:  Diagnosis Date   Abnormal cortisol level 05/01/2023   Abnormal thyroid  function test 05/01/2023   Anxiety disorder    Atypical squamous cell changes of undetermined significance (ASCUS) on vaginal cytology with positive high risk human papilloma virus (HPV) 09/11/2020   Formatting of this note might be different from the original. Many years ago before hyst cryo for abnormal pap 2021 ASCUS pap with pos HR HPV. Colpo-possible lesion- appears excoriated at the R cuff. Brushing shows inflammation with slightly positive  Ki67 index.10 days of metrogel. Recheck 3 weeks. Repeat pap in 6 mos. 03/2021 Nl pap with pos HR HPV   Bursitis, trochanteric 04/15/2013   Cardiac murmur 04/19/2021   Common migraine with intractable migraine 06/05/2021   Contracture of tendon sheath 11/26/2018   Depression with anxiety    Drug therapy 02/02/2018   Epistaxis 03/13/2016   Essential hypertension 04/19/2021   Fibromyalgia    G-6-PD deficiency 03/13/2016   GERD (gastroesophageal reflux disease)    History of adrenal insufficiency 07/14/2023   History of underactive thyroid  08/27/2022   Hyperemesis gravidarum 03/13/2016   Hyperlipidemia    Hypertension    Insomnia    Kidney stone    Laryngeal trauma  07/23/2012   Long term current use of opiate analgesic 03/12/2013   Migraine    Multiple allergies 07/14/2023   Multiple joint pain 03/13/2016   Multiple myeloma (HCC)    Nontoxic multinodular goiter 08/27/2022   Obesity (BMI 35.0-39.9 without comorbidity) 04/19/2021   Onychomycosis due to dermatophyte 11/03/2018   Osteoarthritis of left hip 03/12/2013   Other reduced mobility 07/15/2023   Ovarian cyst 03/13/2016   Pain due to onychomycosis of toenail of left foot 02/19/2018   Pain syndrome, chronic 03/12/2013   Peripheral polyneuropathy 02/02/2018   Pharyngitis 03/13/2016   Positive ANA (antinuclear antibody) 03/13/2016   Renal insufficiency    Sacroiliac pain 03/12/2013   Secondary adrenal insufficiency 05/01/2023   Seizure disorder (HCC) 11/13/2017   Syncope and collapse 01/01/2016   Thoracic spondylosis 03/12/2013   Thyroid  disease    Urticaria    Vaginal bleeding 05/20/2019   Formatting of this note might be different from the original.  Prior hyst ~2005 for benign indications Bleeding 05/2019 Postcoital bleeding reported 08/2020   Vitamin D  insufficiency 05/01/2023   Voice hoarseness 02/02/2018   Past Surgical History:  Procedure Laterality Date   ABDOMINAL HYSTERECTOMY     APPENDECTOMY     CHOLECYSTECTOMY     ESOPHAGOGASTRODUODENOSCOPY  08/29/2017   Mild gastritis. Stauts post empiric esophageal dilitation.    TOE SURGERY Left    removed toe nail   TONSILLECTOMY AND  ADENOIDECTOMY     Patient Active Problem List   Diagnosis Date Noted   Urticaria    Other reduced mobility 07/15/2023   History of adrenal insufficiency 07/14/2023   Multiple allergies 07/14/2023   Abnormal cortisol level 05/01/2023   Abnormal thyroid  function test 05/01/2023   Secondary adrenal insufficiency 05/01/2023   Vitamin D  insufficiency 05/01/2023   History of underactive thyroid  08/27/2022   Nontoxic multinodular goiter 08/27/2022   Anxiety disorder 06/26/2021   GERD (gastroesophageal  reflux disease) 06/26/2021   Hyperlipidemia 06/26/2021   Hypertension 06/26/2021   Common migraine with intractable migraine 06/05/2021   Depression with anxiety 06/01/2021   Fibromyalgia 06/01/2021   Kidney stone 06/01/2021   Migraine 06/01/2021   Thyroid  disease 06/01/2021   Essential hypertension 04/19/2021   Cardiac murmur 04/19/2021   Obesity (BMI 35.0-39.9 without comorbidity) 04/19/2021   Atypical squamous cell changes of undetermined significance (ASCUS) on vaginal cytology with positive high risk human papilloma virus (HPV) 09/11/2020   Vaginal bleeding 05/20/2019   Contracture of tendon sheath 11/26/2018   Onychomycosis due to dermatophyte 11/03/2018   Pain due to onychomycosis of toenail of left foot 02/19/2018   Drug therapy 02/02/2018   Peripheral polyneuropathy 02/02/2018   Voice hoarseness 02/02/2018   Seizure disorder (HCC) 11/13/2017   Epistaxis 03/13/2016   G-6-PD deficiency 03/13/2016   Hyperemesis gravidarum 03/13/2016   Insomnia 03/13/2016   Multiple joint pain 03/13/2016   Ovarian cyst 03/13/2016   Pharyngitis 03/13/2016   Positive ANA (antinuclear antibody) 03/13/2016   Syncope and collapse 01/01/2016   Bursitis, trochanteric 04/15/2013   Long term current use of opiate analgesic 03/12/2013   Osteoarthritis of left hip 03/12/2013   Pain syndrome, chronic 03/12/2013   Sacroiliac pain 03/12/2013   Thoracic spondylosis 03/12/2013   Laryngeal trauma 07/23/2012    ONSET DATE: 07/16/2024 (referral)   REFERRING DIAG: R53.1 (ICD-10-CM) - Weakness  THERAPY DIAG:  Muscle weakness (generalized)  Other abnormalities of gait and mobility  Unsteadiness on feet  Rationale for Evaluation and Treatment: Rehabilitation  SUBJECTIVE:                                                                                                                                                                                             SUBJECTIVE STATEMENT: Kelly Haas  Pt  presents w/SBQC. Reports she has had diarrhea since Sunday, has not been able to keep anything down over the past 2 days. Diarrhea stopped last night and she has drank a lot of water today, but no food. No falls.  Reports she feels like she can stop using her cane in about 2-3 weeks, is  ready.   States she resumed taking her Lamotigrine after last session, unsure when pt stopped this.   Pt accompanied by: self, drove herself today   PERTINENT HISTORY: PMH: HTN, seizures, migraines, anxiety, depression, chronic pain syndrome, fibromyalgia, GERD, hypothyroidism, peptic ulcer disease, G6PD deficiency, multinodular goiter, polyneuropathy, secondary adrenal insufficiency, multiple allergies, recurrent episodes of anaphylaxis, idiopathic urticaria   PAIN:  Are you having pain? Yes: NPRS scale: 0/10 currently, gets up to 8/10 Pain location: L thigh Pain description: sharp Aggravating factors: N/A Relieving factors: hot shower  PRECAUTIONS: Fall  RED FLAGS: None   WEIGHT BEARING RESTRICTIONS: No  FALLS: Has patient fallen in last 6 months? Yes. Number of falls frequently, unable to give further information   SEIZURES: Most recent one was 3 weeks ago, had a headache while driving and called her daughter who came to get her and she pulled over Lack of sleep and stress throw everything off  LIVING ENVIRONMENT: Lives with: lives alone Lives in: House/apartment Stairs: Yes: External: 2 steps; none; steps at back door, level entry at front door Has following equipment at home: Quad cane small base, Environmental consultant - 4 wheeled, Wheelchair (manual), shower chair, and bed side commode - BSC sits over her toilet  PLOF: Independent with basic ADLs, Independent with gait, Independent with transfers, and Requires assistive device for independence  PATIENT GOALS: to get off my convertible [facility]), I want to run-I know I can't run, run, I want this leg (left) to be able to hop, skip, and  jump  OBJECTIVE:  Note: Objective measures were completed at Evaluation unless otherwise noted.  DIAGNOSTIC FINDINGS: None updated or relevant to this POC  COGNITION: Overall cognitive status: her family says she will repeat stuff, possible short term memory impairments   SENSATION: Gets numbness in LLE at times, no tingling Decreased light touch sensation in LLE, tingling in distal LLE  COORDINATION: Impaired LLE  POSTURE: forward head and hunched shoulders  LOWER EXTREMITY ROM:   WFL  Active  Right Eval Left Eval  Hip flexion    Hip extension    Hip abduction    Hip adduction    Hip internal rotation    Hip external rotation    Knee flexion    Knee extension    Ankle dorsiflexion    Ankle plantarflexion    Ankle inversion    Ankle eversion     (Blank rows = not tested)  LOWER EXTREMITY MMT:    MMT Right Eval Left Eval  Hip flexion 5 4+  Hip extension    Hip abduction    Hip adduction    Hip internal rotation    Hip external rotation    Knee flexion 5 4  Knee extension 5 4  Ankle dorsiflexion 5 4  Ankle plantarflexion    Ankle inversion    Ankle eversion    (Blank rows = not tested)  BED MOBILITY:  Mod I per pt report  TRANSFERS: Sit to stand: Modified independence  Assistive device utilized: None     Stand to sit: Modified independence  Assistive device utilized: None     Chair to chair: Modified independence  Assistive device utilized: None       RAMP:  Not tested  CURB:  Not tested  STAIRS: STAIRS:  Level of Assistance: CGA  Stair Negotiation Technique: Alternating Pattern  with Bilateral Rails  Number of Stairs: 4   Height of Stairs: 6  Comments: WFL   GAIT: Findings:  Gait pattern:  decreased hip/knee flexion- Left and genu recurvatum- Left Distance walked: various clinic distances Assistive device utilized: Quad cane small base and None Level of assistance: Modified independence   FUNCTIONAL TESTS:    [facility] PT Assessment  - 09/08/24 0001       Ambulation/Gait   Gait velocity 32.8' over 12.06s = 2.72 ft/s w/SBQC      Standardized Balance Assessment   Five times sit to stand comments  14.12s   no UE support     Functional Gait  Assessment   Gait assessed  Yes    Gait Level Surface Walks 20 ft, slow speed, abnormal gait pattern, evidence for imbalance or deviates 10-15 in outside of the 12 in walkway width. Requires more than 7 sec to ambulate 20 ft.   7.56s   Change in Gait Speed Able to smoothly change walking speed without loss of balance or gait deviation. Deviate no more than 6 in outside of the 12 in walkway width.    Gait with Horizontal Head Turns Performs head turns smoothly with slight change in gait velocity (eg, minor disruption to smooth gait path), deviates 6-10 in outside 12 in walkway width, or uses an assistive device.    Gait with Vertical Head Turns Performs task with slight change in gait velocity (eg, minor disruption to smooth gait path), deviates 6 - 10 in outside 12 in walkway width or uses assistive device             VITALS: Vitals:   09/08/24 0935  BP: 115/69  Pulse: 78                                                                                                                                  TREATMENT:   Self-Care/Home Management  Assessed vitals in LUE at rest (see above) and WNL for pt  Ther Act SciFit multi-peaks level 11.0 for 8 minutes using BUE/BLEs for neural priming for reciprocal movement, dynamic cardiovascular warmup and increased amplitude of stepping. RPE of 4-5/10 following activity  Educated pt on not using cane in her home and w/short community distances she is comfortable with (like coming to clinic) to work on transition to not using AD. Pt verbalized understanding.   Physical Performance - unable to complete FGA Assessment due to fatigue/instability following multiple days of diarrhea and lack of food/hydration   Ther Act  On mat, long sit  march overs using 12# KB knocked over, x15 reps per side, for improved core stability and hip flexor strength. Pt performed well on RLE, but would not stop on LLE. Pt cued multiple times to stop on LLE but continued anyway, ignoring therapist education and instruction. Informed pt that in order to be successful in PT, she must communicate and work with therapist. Pt reports she did not hear therapist but that she would be better in future.  Encouraged pt to purchase some LMNT from [employer] or [employer]  to replenish her electrolytes and to try to eat something today. Provided pt w/this information on a Post-it. Pt verbalized understanding.    PATIENT EDUCATION: Education details: Continue HEP, encouragement to replenish electrolytes and eat. Importance of communicating w/therapist and listening to cues to ensure safety  Person educated: Patient Education method: Explanation, Demonstration, and Verbal cues Education comprehension: verbalized understanding, returned demonstration, verbal cues required, and needs further education  HOME EXERCISE PROGRAM: Access Code: [email or web address] URL: [email or web address] Date: 08/16/2024 Prepared by: Marlon Taneeka Curtner  Exercises - Side Stepping with Resistance at Ankles and Counter Support  - 1 x daily - 7 x weekly - 3 sets - 10 reps - Backward Monster Walk with Resistance at Ankles and Counter Support  - 1 x daily - 7 x weekly - 3 sets - 10 reps - Side Plank with Clam  - 1 x daily - 7 x weekly - 3 sets - 5-8 reps - Bear Hold   - 1 x daily - 7 x weekly - 3-5 sets - 10-15 second hold  GOALS: Goals reviewed with patient? No  SHORT TERM GOALS: Target date: 09/01/2024   Pt will be independent with initial HEP for improved strength, balance, transfers and gait. Baseline: Goal status: MET  2.  Pt will improve gait velocity to at least 2.5 ft/sec for improved gait efficiency and performance at mod I level  Baseline: 2.29 ft/sec mod I with SBQC (9/24);  2.72 ft/s w/SBQC (10/15) Goal status: MET  3.  Pt will improve 5 x STS to less than or equal to 16 seconds to demonstrate improved functional strength and transfer efficiency.  Baseline: 19.8 sec no UE (9/24); 14.12s no UE (10/15) Goal status: MET  4.  Pt will improve FGA to 22/30 for decreased fall risk  Baseline: 18/30 (9/24);  Goal status: INITIAL     LONG TERM GOALS: Target date: 09/22/2024   Pt will be independent with final HEP for improved strength, balance, transfers and gait. Baseline:  Goal status: INITIAL  2.  Pt will improve gait velocity to at least 3.0 ft/sec for improved gait efficiency and performance at mod I level  Baseline: 2.29 ft/sec mod I with SBQC (9/24); 2.72 ft/s w/SBQC (10/15) Goal status: REVISED   3.  Pt will improve 5 x STS to less than or equal to 13 seconds to demonstrate improved functional strength and transfer efficiency.  Baseline: 19.8 sec no UE (9/24); 14.12s no UE (10/15) Goal status: INITIAL  4.  Pt will improve FGA to 26/30 for decreased fall risk  Baseline: 18/30 (9/24) Goal status: INITIAL  5.  Pt will trial gait with no AD to determine if she would be safe to progress away from her [facility] Baseline:  Goal status: INITIAL    ASSESSMENT:  CLINICAL IMPRESSION: Session limited as pt has had GI distress since Sunday and has been unable to eat or drink anything since. Pt w/very flat affect and very soft spoken today. Pt frequently ignoring therapist's cues and education today, so unable to complete FGA due to safety concerns. Pt reports she resumed taking Lamotigrine following last PT session and therapist unsure when she stopped taking this. Therapist did communicate concerns to pt regarding her behavior and affect, in which pt repeatedly stated she was fine. Pt encouraged to try LMNT and eat crackers or any solid food, as she has not eaten or drank anything for several days. Continue POC.    OBJECTIVE IMPAIRMENTS: Abnormal gait,  decreased balance, decreased cognition, decreased coordination,  decreased mobility, difficulty walking, decreased strength, impaired perceived functional ability, impaired sensation, postural dysfunction, and pain.   ACTIVITY LIMITATIONS: carrying, lifting, bending, squatting, and stairs  PARTICIPATION LIMITATIONS: driving and community activity  PERSONAL FACTORS: Time since onset of injury/illness/exacerbation and 3+ comorbidities:   HTN, seizures, migraines, anxiety, depression, chronic pain syndrome, fibromyalgia, GERD, hypothyroidism, peptic ulcer disease, G6PD deficiency, multinodular goiter, polyneuropathy, secondary adrenal insufficiency, multiple allergies, recurrent episodes of anaphylaxis, idiopathic urticaria are also affecting patient's functional outcome.   REHAB POTENTIAL: Good  CLINICAL DECISION MAKING: Evolving/moderate complexity  EVALUATION COMPLEXITY: Moderate  PLAN:  PT FREQUENCY: 2x/week  PT DURATION: 6 weeks  PLANNED INTERVENTIONS: 97164- PT Re-evaluation, 97750- Physical Performance Testing, 97110-Therapeutic exercises, 97530- Therapeutic activity, [id number]- Neuromuscular re-education, 97535- Self Care, [id number]- Manual therapy, [id number]- Gait training, [phone]- Orthotic Initial, [phone]- Orthotic/Prosthetic subsequent, [phone]- Aquatic Therapy, [phone]- Electrical stimulation (manual), [phone] (1-2 muscles), [id number] (3+ muscles)- Dry Needling, Patient/Family education, Balance training, Stair training, Taping, Joint mobilization, Spinal manipulation, Cognitive remediation, DME instructions, Cryotherapy, and Moist heat  PLAN FOR NEXT SESSION: Finish FGA. continue education about not driving following seizures, trial gait with no AD, work on LLE NMR and strengthening (hip and knee flexion, address genu recurvatum), what HEP does she have from [facility]?, estim or [place]? Work on stability without UE support, ankle/hip strategy. Dual tasking    Marlon BRAVO Eliberto Sole, PT,  DPT 09/08/2024, 10:16 AM

## 2024-09-10 ENCOUNTER — Encounter: Payer: Self-pay | Admitting: Physical Therapy

## 2024-09-10 ENCOUNTER — Ambulatory Visit: Admitting: Physical Therapy

## 2024-09-10 VITALS — BP 107/78 | HR 97

## 2024-09-10 DIAGNOSIS — R2681 Unsteadiness on feet: Secondary | ICD-10-CM

## 2024-09-10 DIAGNOSIS — M6281 Muscle weakness (generalized): Secondary | ICD-10-CM

## 2024-09-10 DIAGNOSIS — R42 Dizziness and giddiness: Secondary | ICD-10-CM

## 2024-09-10 DIAGNOSIS — R2689 Other abnormalities of gait and mobility: Secondary | ICD-10-CM

## 2024-09-10 NOTE — Therapy (Signed)
 OUTPATIENT PHYSICAL THERAPY NEURO TREATMENT/RE-CERT   Patient Name: Kelly Haas MRN: [id number] DOB:Apr 10, 1971, 53 y.o., female Today's Date: 09/10/2024   PCP: Jama Chow, MD REFERRING PROVIDER: Jama Chow, MD  END OF SESSION:  PT End of Session - 09/10/24 1106     Visit Number 8    Number of Visits 13   with eval   Date for Recertification  10/06/24   to allow for scheduling delays   Authorization Type Medicare    PT Start Time 1103    PT Stop Time 1150    PT Time Calculation (min) 47 min    Equipment Utilized During Treatment Gait belt    Activity Tolerance Patient tolerated treatment well;Patient limited by lethargy    Behavior During Therapy Flat affect;WFL for tasks assessed/performed              Past Medical History:  Diagnosis Date   Abnormal cortisol level 05/01/2023   Abnormal thyroid  function test 05/01/2023   Anxiety disorder    Atypical squamous cell changes of undetermined significance (ASCUS) on vaginal cytology with positive high risk human papilloma virus (HPV) 09/11/2020   Formatting of this note might be different from the original. Many years ago before hyst cryo for abnormal pap 2021 ASCUS pap with pos HR HPV. Colpo-possible lesion- appears excoriated at the R cuff. Brushing shows inflammation with slightly positive  Ki67 index.10 days of metrogel. Recheck 3 weeks. Repeat pap in 6 mos. 03/2021 Nl pap with pos HR HPV   Bursitis, trochanteric 04/15/2013   Cardiac murmur 04/19/2021   Common migraine with intractable migraine 06/05/2021   Contracture of tendon sheath 11/26/2018   Depression with anxiety    Drug therapy 02/02/2018   Epistaxis 03/13/2016   Essential hypertension 04/19/2021   Fibromyalgia    G-6-PD deficiency 03/13/2016   GERD (gastroesophageal reflux disease)    History of adrenal insufficiency 07/14/2023   History of underactive thyroid  08/27/2022   Hyperemesis gravidarum 03/13/2016   Hyperlipidemia    Hypertension     Insomnia    Kidney stone    Laryngeal trauma 07/23/2012   Long term current use of opiate analgesic 03/12/2013   Migraine    Multiple allergies 07/14/2023   Multiple joint pain 03/13/2016   Multiple myeloma (HCC)    Nontoxic multinodular goiter 08/27/2022   Obesity (BMI 35.0-39.9 without comorbidity) 04/19/2021   Onychomycosis due to dermatophyte 11/03/2018   Osteoarthritis of left hip 03/12/2013   Other reduced mobility 07/15/2023   Ovarian cyst 03/13/2016   Pain due to onychomycosis of toenail of left foot 02/19/2018   Pain syndrome, chronic 03/12/2013   Peripheral polyneuropathy 02/02/2018   Pharyngitis 03/13/2016   Positive ANA (antinuclear antibody) 03/13/2016   Renal insufficiency    Sacroiliac pain 03/12/2013   Secondary adrenal insufficiency 05/01/2023   Seizure disorder (HCC) 11/13/2017   Syncope and collapse 01/01/2016   Thoracic spondylosis 03/12/2013   Thyroid  disease    Urticaria    Vaginal bleeding 05/20/2019   Formatting of this note might be different from the original.  Prior hyst ~2005 for benign indications Bleeding 05/2019 Postcoital bleeding reported 08/2020   Vitamin D  insufficiency 05/01/2023   Voice hoarseness 02/02/2018   Past Surgical History:  Procedure Laterality Date   ABDOMINAL HYSTERECTOMY     APPENDECTOMY     CHOLECYSTECTOMY     ESOPHAGOGASTRODUODENOSCOPY  08/29/2017   Mild gastritis. Stauts post empiric esophageal dilitation.    TOE SURGERY Left    removed toe nail  TONSILLECTOMY AND ADENOIDECTOMY     Patient Active Problem List   Diagnosis Date Noted   Urticaria    Other reduced mobility 07/15/2023   History of adrenal insufficiency 07/14/2023   Multiple allergies 07/14/2023   Abnormal cortisol level 05/01/2023   Abnormal thyroid  function test 05/01/2023   Secondary adrenal insufficiency 05/01/2023   Vitamin D  insufficiency 05/01/2023   History of underactive thyroid  08/27/2022   Nontoxic multinodular goiter 08/27/2022   Anxiety  disorder 06/26/2021   GERD (gastroesophageal reflux disease) 06/26/2021   Hyperlipidemia 06/26/2021   Hypertension 06/26/2021   Common migraine with intractable migraine 06/05/2021   Depression with anxiety 06/01/2021   Fibromyalgia 06/01/2021   Kidney stone 06/01/2021   Migraine 06/01/2021   Thyroid  disease 06/01/2021   Essential hypertension 04/19/2021   Cardiac murmur 04/19/2021   Obesity (BMI 35.0-39.9 without comorbidity) 04/19/2021   Atypical squamous cell changes of undetermined significance (ASCUS) on vaginal cytology with positive high risk human papilloma virus (HPV) 09/11/2020   Vaginal bleeding 05/20/2019   Contracture of tendon sheath 11/26/2018   Onychomycosis due to dermatophyte 11/03/2018   Pain due to onychomycosis of toenail of left foot 02/19/2018   Drug therapy 02/02/2018   Peripheral polyneuropathy 02/02/2018   Voice hoarseness 02/02/2018   Seizure disorder (HCC) 11/13/2017   Epistaxis 03/13/2016   G-6-PD deficiency 03/13/2016   Hyperemesis gravidarum 03/13/2016   Insomnia 03/13/2016   Multiple joint pain 03/13/2016   Ovarian cyst 03/13/2016   Pharyngitis 03/13/2016   Positive ANA (antinuclear antibody) 03/13/2016   Syncope and collapse 01/01/2016   Bursitis, trochanteric 04/15/2013   Long term current use of opiate analgesic 03/12/2013   Osteoarthritis of left hip 03/12/2013   Pain syndrome, chronic 03/12/2013   Sacroiliac pain 03/12/2013   Thoracic spondylosis 03/12/2013   Laryngeal trauma 07/23/2012    ONSET DATE: 07/16/2024 (referral)   REFERRING DIAG: R53.1 (ICD-10-CM) - Weakness  THERAPY DIAG:  Muscle weakness (generalized)  Other abnormalities of gait and mobility  Unsteadiness on feet  Dizziness and giddiness  Rationale for Evaluation and Treatment: Rehabilitation  SUBJECTIVE:                                                                                                                                                                                              SUBJECTIVE STATEMENT: Kelly Haas  Pt reports has not been able to eat since last session, has been nibbling on some crackers every once in a while. Tried the LMNT and reports they were gross and it mae her want to throw up. Goes to see her PCP on Monday. Sees her ophthalmologist next week and  has not been in 3 years. Has not taken her meclizine so far this week.   Came in to session not using any AD as pt thought she did not have to bring it in and pt tearful and holding onto the wall. No falls.    Pt accompanied by: self, drove herself today   PERTINENT HISTORY: PMH: HTN, seizures, migraines, anxiety, depression, chronic pain syndrome, fibromyalgia, GERD, hypothyroidism, peptic ulcer disease, G6PD deficiency, multinodular goiter, polyneuropathy, secondary adrenal insufficiency, multiple allergies, recurrent episodes of anaphylaxis, idiopathic urticaria   PAIN:  Are you having pain? No  PRECAUTIONS: Fall  RED FLAGS: None   WEIGHT BEARING RESTRICTIONS: No  FALLS: Has patient fallen in last 6 months? Yes. Number of falls frequently, unable to give further information   SEIZURES: Most recent one was 3 weeks ago, had a headache while driving and called her daughter who came to get her and she pulled over Lack of sleep and stress throw everything off  LIVING ENVIRONMENT: Lives with: lives alone Lives in: House/apartment Stairs: Yes: External: 2 steps; none; steps at back door, level entry at front door Has following equipment at home: Quad cane small base, Environmental consultant - 4 wheeled, Wheelchair (manual), shower chair, and bed side commode - BSC sits over her toilet  PLOF: Independent with basic ADLs, Independent with gait, Independent with transfers, and Requires assistive device for independence  PATIENT GOALS: to get off my convertible [facility]), I want to run-I know I can't run, run, I want this leg (left) to be able to hop, skip, and jump  OBJECTIVE:   Note: Objective measures were completed at Evaluation unless otherwise noted.  DIAGNOSTIC FINDINGS: None updated or relevant to this POC  COGNITION: Overall cognitive status: her family says she will repeat stuff, possible short term memory impairments   SENSATION: Gets numbness in LLE at times, no tingling Decreased light touch sensation in LLE, tingling in distal LLE  COORDINATION: Impaired LLE  POSTURE: forward head and hunched shoulders  LOWER EXTREMITY ROM:   WFL  Active  Right Eval Left Eval  Hip flexion    Hip extension    Hip abduction    Hip adduction    Hip internal rotation    Hip external rotation    Knee flexion    Knee extension    Ankle dorsiflexion    Ankle plantarflexion    Ankle inversion    Ankle eversion     (Blank rows = not tested)  LOWER EXTREMITY MMT:    MMT Right Eval Left Eval  Hip flexion 5 4+  Hip extension    Hip abduction    Hip adduction    Hip internal rotation    Hip external rotation    Knee flexion 5 4  Knee extension 5 4  Ankle dorsiflexion 5 4  Ankle plantarflexion    Ankle inversion    Ankle eversion    (Blank rows = not tested)  BED MOBILITY:  Mod I per pt report  TRANSFERS: Sit to stand: Modified independence  Assistive device utilized: None     Stand to sit: Modified independence  Assistive device utilized: None     Chair to chair: Modified independence  Assistive device utilized: None       RAMP:  Not tested  CURB:  Not tested  STAIRS: STAIRS:  Level of Assistance: CGA  Stair Negotiation Technique: Alternating Pattern  with Bilateral Rails  Number of Stairs: 4   Height of Stairs: 6  Comments:  WFL   GAIT: Findings:  Gait pattern: decreased hip/knee flexion- Left and genu recurvatum- Left Distance walked: various clinic distances Assistive device utilized: Quad cane small base and None Level of assistance: Modified independence   FUNCTIONAL TESTS:         VITALS: Vitals:    09/10/24 1120 09/10/24 1121  BP: 116/79 107/78  Pulse: 75 97                                                                                                                                   TREATMENT:   VESTIBULAR ASSESSMENT   GENERAL OBSERVATION: Pt received from waiting room at start of session with no AD and pt tearful and holding onto walls and pt reporting she felt scared not bringing in her cane. Pt was not instructed to do this by primary PT. Went to provide pt with clinic hurrycane and pt able to walk back into tx gym with SBA.    SYMPTOM BEHAVIOR:   Subjective history: Reports getting up in the morning she has to catch herself. Or if she has been laying down for a while she has to catch herself. Sometimes if she is on the road for a while and gets out will throw her off. Will feel a head rush. Reports when she gets up in the morning she feels like things are moving or spinning. Has to get up and use the bathroom and uses the walls to help her walk. When sitting in the living room if she gets up too fast, has to catch her balance because she feels a head rush. Has been on Meclizine for a few years, takes it as needed. Pt reports it doesn't help sometimes.    Non-Vestibular symptoms: diplopia, headaches, nausea/vomiting, migraine symptoms, and sometimes will be laying in the bed and will hear something in her ear   Type of dizziness: Spinning/Vertigo and head rush feeling    Frequency: every morning when she gets up    Duration: Spinning lasts a few seconds    Aggravating factors: Induced by position change: supine to sit and sit to stand and Induced by motion: sitting in a moving car   Relieving factors: being still for a few minutes, sometimes taking the Meclizine    Progression of symptoms: unchanged   OCULOMOTOR EXAM:   Ocular Alignment: normal   Ocular ROM: No Limitations   Spontaneous Nystagmus: absent   Gaze-Induced Nystagmus: absent   Smooth Pursuits: intact and slow  and jumpy at times, when it was in superior direction, reported seeing double pen and a halo    Saccades: in horizontal direction, had about 2 saccadic eye movements, felt off and spinning, vertical direction WNL and pt reporting no sx     VESTIBULAR - OCULAR REFLEX:    Slow VOR: Normal and Comment: able to have eyes focused on therapist's eyes, but movements were not as smooth, reports saw double of  therapist, tried again with no glasses on and pt reporting worsening double vision     VOR Cancellation: Unable to Maintain Gaze when going to the L side, reported feeling awkward over there    Head-Impulse Test: HIT Right: positive HIT Left: positive Pt worse when going to the L side      POSITIONAL TESTING: Right Sidelying: no nystagmus and reports seeing double in this position, and reports spinning when she comes up  Left Sidelying: no nystagmus and when she came up on that side, reporting she felt like the balls in the therapy gym were moving for just a few seconds   OTHOSTATICS: Vitals:   09/10/24 1120 09/10/24 1121  BP: 116/79 107/78  Pulse: 75 97   Seated, Standing   Pt not reporting any dizziness or lightheadedness   Per Dr. Janean note on 09/03/23 regarding imaging from recent hospital visit over 8/26/204: In the hospital head CT was negative for any acute stroke, MRI was also negative for acute stroke.  At end of session, PT ambulated pt out to her car for safety with clinic hurrycane with mod I. Educated to make sure she's brings in her cane to future sessions for improved stability. And educated that pt should be getting a ride as she should not be driving after seizures.   Educated on clinical findings on vestibular assessment and pt's sx are more consistent with a central cause of dizziness rather than peripheral. Pt also with double vision (that she reports she has not mentioned this to any physician and can't recall when it started) - educated to make sure she mentions this  to her PCP as well as ophthalmologist (has visit in a couple weeks) and educated how double vision can contribute to dizziness. Discussed can schedule a visit in the future in POC on this therapist after pt sees the ophthalmologist to see if there is anything that PT can address.   PATIENT EDUCATION: Education details: Continued to encourage pt to replenish electrolytes and eat. Making sure pt brings in cane to next session. See above for education regarding clinical findings pf vestibular assessment  Person educated: Patient Education method: Explanation, Demonstration, and Verbal cues Education comprehension: verbalized understanding, returned demonstration, verbal cues required, and needs further education  HOME EXERCISE PROGRAM: Access Code: [email or web address] URL: [email or web address] Date: 08/16/2024 Prepared by: Marlon Plaster  Exercises - Side Stepping with Resistance at Ankles and Counter Support  - 1 x daily - 7 x weekly - 3 sets - 10 reps - Backward Monster Walk with Resistance at Ankles and Counter Support  - 1 x daily - 7 x weekly - 3 sets - 10 reps - Side Plank with Clam  - 1 x daily - 7 x weekly - 3 sets - 5-8 reps - Bear Hold   - 1 x daily - 7 x weekly - 3-5 sets - 10-15 second hold  GOALS: Goals reviewed with patient? No  SHORT TERM GOALS: Target date: 09/01/2024   Pt will be independent with initial HEP for improved strength, balance, transfers and gait. Baseline: Goal status: MET  2.  Pt will improve gait velocity to at least 2.5 ft/sec for improved gait efficiency and performance at mod I level  Baseline: 2.29 ft/sec mod I with SBQC (9/24); 2.72 ft/s w/SBQC (10/15) Goal status: MET  3.  Pt will improve 5 x STS to less than or equal to 16 seconds to demonstrate improved functional strength and transfer efficiency.  Baseline: 19.8 sec no UE (  9/24); 14.12s no UE (10/15) Goal status: MET  4.  Pt will improve FGA to 22/30 for decreased fall risk  Baseline:  18/30 (9/24);  Goal status: INITIAL     LONG TERM GOALS: Target date: 09/22/2024   Pt will be independent with final HEP for improved strength, balance, transfers and gait. Baseline:  Goal status: INITIAL  2.  Pt will improve gait velocity to at least 3.0 ft/sec for improved gait efficiency and performance at mod I level  Baseline: 2.29 ft/sec mod I with SBQC (9/24); 2.72 ft/s w/SBQC (10/15) Goal status: REVISED   3.  Pt will improve 5 x STS to less than or equal to 13 seconds to demonstrate improved functional strength and transfer efficiency.  Baseline: 19.8 sec no UE (9/24); 14.12s no UE (10/15) Goal status: INITIAL  4.  Pt will improve FGA to 26/30 for decreased fall risk  Baseline: 18/30 (9/24) Goal status: INITIAL  5.  Pt will trial gait with no AD to determine if she would be safe to progress away from her [facility] Baseline:  Goal status: INITIAL    ASSESSMENT:  CLINICAL IMPRESSION:  Pt ambulated into session with no AD and was tearful at start of session due to being fearful without having her cane (pt had not been cleared by this by primary PT). Provided pt with clinic hurrycane to ambulate back into session with. Today's skilled session focused on further vestibular assessment due to pt reports of dizziness (that she reports as a head rush or spinning). Pt reports that she has not mentioned her dizziness in a few years to her PCP and that was when she was prescribed Meclizine (pt had not taken it in the past week). Pt also reports double vision (pt not sure when it started) and pt has not mentioned this to any of her physicians. Pt has an appt with her opthalmologist in a couple weeks (has not seen them in 3 years) and educated pt to make sure that she mentions this to them as that can be contributing to her dizziness. Pt demonstrating central signs in regards to her dizziness with oculomotor and VOR testing and also reporting seeing double. Pt negative for BPPV with R and L  sidelying test with no nystagmus noted. Pt reporting dizziness and double vision in positions. Discussed would want pt to see ophthalmologist first regarding double vision and after that appt in the beginning of November, then can add further visits for dizziness if appropriate. Pt verbalized understanding. Re-cert performed to include dizziness in PT diagnosis and vestibular retraining in POC if appropriate.   OBJECTIVE IMPAIRMENTS: Abnormal gait, decreased balance, decreased cognition, decreased coordination, decreased mobility, difficulty walking, decreased strength, impaired perceived functional ability, impaired sensation, postural dysfunction, and pain.   ACTIVITY LIMITATIONS: carrying, lifting, bending, squatting, and stairs  PARTICIPATION LIMITATIONS: driving and community activity  PERSONAL FACTORS: Time since onset of injury/illness/exacerbation and 3+ comorbidities:   HTN, seizures, migraines, anxiety, depression, chronic pain syndrome, fibromyalgia, GERD, hypothyroidism, peptic ulcer disease, G6PD deficiency, multinodular goiter, polyneuropathy, secondary adrenal insufficiency, multiple allergies, recurrent episodes of anaphylaxis, idiopathic urticaria are also affecting patient's functional outcome.   REHAB POTENTIAL: Good  CLINICAL DECISION MAKING: Evolving/moderate complexity  EVALUATION COMPLEXITY: Moderate  PLAN:  PT FREQUENCY: 2x/week  PT DURATION: 6 weeks  PLANNED INTERVENTIONS: [id number]- PT Re-evaluation, 97750- Physical Performance Testing, 97110-Therapeutic exercises, 97530- Therapeutic activity, [id number]- Neuromuscular re-education, 97535- Self Care, [id number]- Manual therapy, [id number]- Gait training, [id number]- Orthotic Initial, [id number]- Orthotic/Prosthetic subsequent, [id number]- Aquatic Therapy,  [id number]- Electrical stimulation (manual), [id number] (1-2 muscles), 20561 (3+ muscles)- Dry Needling, Patient/Family education, Balance training, Stair training, Taping, Joint mobilization, Spinal  manipulation, Vestibular training, Cognitive remediation, DME instructions, Cryotherapy, and Moist heat  PLAN FOR NEXT SESSION: Finish FGA. continue education about not driving following seizures, trial gait with no AD, work on LLE NMR and strengthening (hip and knee flexion, address genu recurvatum), what HEP does she have from [facility]?, estim or [facility]? Work on stability without UE support, ankle/hip strategy. Dual tasking   Pt with more central dizziness signs as well as double vision, would want pt to see ophthalmologist first regarding double vision and then can add in vestibular sessions after with me if needed? - CG   [place] LOISE Senate, PT, DPT 09/10/2024, 1:09 PM

## 2024-09-13 ENCOUNTER — Telehealth: Payer: Self-pay | Admitting: Physical Therapy

## 2024-09-13 ENCOUNTER — Ambulatory Visit: Admitting: Physical Therapy

## 2024-09-13 NOTE — Telephone Encounter (Signed)
 Called and spoke to pt regarding no-show to scheduled PT appointment. Pt reports she did not have appointment written down in her calendar so did not know about it. Pt confirmed next appointment date and time.   Kelly Haas E Rina Adney, PT, DPT

## 2024-09-13 NOTE — Therapy (Incomplete)
 OUTPATIENT PHYSICAL THERAPY NEURO TREATMENT   Patient Name: Kelly Haas MRN: [id number] DOB:[birth year], 53 y.o., female Today's Date: 09/13/2024   PCP: Jama Chow, MD REFERRING PROVIDER: Jama Chow, MD  END OF SESSION:        Past Medical History:  Diagnosis Date   Abnormal cortisol level 05/01/2023   Abnormal thyroid  function test 05/01/2023   Anxiety disorder    Atypical squamous cell changes of undetermined significance (ASCUS) on vaginal cytology with positive high risk human papilloma virus (HPV) 09/11/2020   Formatting of this note might be different from the original. Many years ago before hyst cryo for abnormal pap 2021 ASCUS pap with pos HR HPV. Colpo-possible lesion- appears excoriated at the R cuff. Brushing shows inflammation with slightly positive  Ki67 index.10 days of metrogel. Recheck 3 weeks. Repeat pap in 6 mos. 03/2021 Nl pap with pos HR HPV   Bursitis, trochanteric 04/15/2013   Cardiac murmur 04/19/2021   Common migraine with intractable migraine 06/05/2021   Contracture of tendon sheath 11/26/2018   Depression with anxiety    Drug therapy 02/02/2018   Epistaxis 03/13/2016   Essential hypertension 04/19/2021   Fibromyalgia    G-6-PD deficiency 03/13/2016   GERD (gastroesophageal reflux disease)    History of adrenal insufficiency 07/14/2023   History of underactive thyroid  08/27/2022   Hyperemesis gravidarum 03/13/2016   Hyperlipidemia    Hypertension    Insomnia    Kidney stone    Laryngeal trauma 07/23/2012   Long term current use of opiate analgesic 03/12/2013   Migraine    Multiple allergies 07/14/2023   Multiple joint pain 03/13/2016   Multiple myeloma (HCC)    Nontoxic multinodular goiter 08/27/2022   Obesity (BMI 35.0-39.9 without comorbidity) 04/19/2021   Onychomycosis due to dermatophyte 11/03/2018   Osteoarthritis of left hip 03/12/2013   Other reduced mobility 07/15/2023   Ovarian cyst 03/13/2016   Pain due to onychomycosis of  toenail of left foot 02/19/2018   Pain syndrome, chronic 03/12/2013   Peripheral polyneuropathy 02/02/2018   Pharyngitis 03/13/2016   Positive ANA (antinuclear antibody) 03/13/2016   Renal insufficiency    Sacroiliac pain 03/12/2013   Secondary adrenal insufficiency 05/01/2023   Seizure disorder (HCC) 11/13/2017   Syncope and collapse 01/01/2016   Thoracic spondylosis 03/12/2013   Thyroid  disease    Urticaria    Vaginal bleeding 05/20/2019   Formatting of this note might be different from the original.  Prior hyst ~2005 for benign indications Bleeding 05/2019 Postcoital bleeding reported 08/2020   Vitamin D  insufficiency 05/01/2023   Voice hoarseness 02/02/2018   Past Surgical History:  Procedure Laterality Date   ABDOMINAL HYSTERECTOMY     APPENDECTOMY     CHOLECYSTECTOMY     ESOPHAGOGASTRODUODENOSCOPY  08/29/2017   Mild gastritis. Stauts post empiric esophageal dilitation.    TOE SURGERY Left    removed toe nail   TONSILLECTOMY AND ADENOIDECTOMY     Patient Active Problem List   Diagnosis Date Noted   Urticaria    Other reduced mobility 07/15/2023   History of adrenal insufficiency 07/14/2023   Multiple allergies 07/14/2023   Abnormal cortisol level 05/01/2023   Abnormal thyroid  function test 05/01/2023   Secondary adrenal insufficiency 05/01/2023   Vitamin D  insufficiency 05/01/2023   History of underactive thyroid  08/27/2022   Nontoxic multinodular goiter 08/27/2022   Anxiety disorder 06/26/2021   GERD (gastroesophageal reflux disease) 06/26/2021   Hyperlipidemia 06/26/2021   Hypertension 06/26/2021   Common migraine with intractable migraine 06/05/2021  Depression with anxiety 06/01/2021   Fibromyalgia 06/01/2021   Kidney stone 06/01/2021   Migraine 06/01/2021   Thyroid  disease 06/01/2021   Essential hypertension 04/19/2021   Cardiac murmur 04/19/2021   Obesity (BMI 35.0-39.9 without comorbidity) 04/19/2021   Atypical squamous cell changes of undetermined  significance (ASCUS) on vaginal cytology with positive high risk human papilloma virus (HPV) 09/11/2020   Vaginal bleeding 05/20/2019   Contracture of tendon sheath 11/26/2018   Onychomycosis due to dermatophyte 11/03/2018   Pain due to onychomycosis of toenail of left foot 02/19/2018   Drug therapy 02/02/2018   Peripheral polyneuropathy 02/02/2018   Voice hoarseness 02/02/2018   Seizure disorder (HCC) 11/13/2017   Epistaxis 03/13/2016   G-6-PD deficiency 03/13/2016   Hyperemesis gravidarum 03/13/2016   Insomnia 03/13/2016   Multiple joint pain 03/13/2016   Ovarian cyst 03/13/2016   Pharyngitis 03/13/2016   Positive ANA (antinuclear antibody) 03/13/2016   Syncope and collapse 01/01/2016   Bursitis, trochanteric 04/15/2013   Long term current use of opiate analgesic 03/12/2013   Osteoarthritis of left hip 03/12/2013   Pain syndrome, chronic 03/12/2013   Sacroiliac pain 03/12/2013   Thoracic spondylosis 03/12/2013   Laryngeal trauma 07/23/2012    ONSET DATE: 07/16/2024 (referral)   REFERRING DIAG: R53.1 (ICD-10-CM) - Weakness  THERAPY DIAG:  No diagnosis found.  Rationale for Evaluation and Treatment: Rehabilitation  SUBJECTIVE:                                                                                                                                                                                             SUBJECTIVE STATEMENT: Kelly Haas  Pt reports has not been able to eat since last session, has been nibbling on some crackers every once in a while. Tried the LMNT and reports they were gross and it mae her want to throw up. Goes to see her PCP on Monday. Sees her ophthalmologist next week and has not been in 3 years. Has not taken her meclizine so far this week.   Came in to session not using any AD as pt thought she did not have to bring it in and pt tearful and holding onto the wall. No falls.   ***   Pt accompanied by: self, drove herself today   PERTINENT  HISTORY: PMH: HTN, seizures, migraines, anxiety, depression, chronic pain syndrome, fibromyalgia, GERD, hypothyroidism, peptic ulcer disease, G6PD deficiency, multinodular goiter, polyneuropathy, secondary adrenal insufficiency, multiple allergies, recurrent episodes of anaphylaxis, idiopathic urticaria   PAIN:  Are you having pain? No  PRECAUTIONS: Fall  RED FLAGS: None   WEIGHT BEARING RESTRICTIONS: No  FALLS: Has patient fallen in  last 6 months? Yes. Number of falls frequently, unable to give further information   SEIZURES: Most recent one was 3 weeks ago, had a headache while driving and called her daughter who came to get her and she pulled over Lack of sleep and stress throw everything off  LIVING ENVIRONMENT: Lives with: lives alone Lives in: House/apartment Stairs: Yes: External: 2 steps; none; steps at back door, level entry at front door Has following equipment at home: Quad cane small base, Environmental Consultant - 4 wheeled, Wheelchair (manual), shower chair, and bed side commode - BSC sits over her toilet  PLOF: Independent with basic ADLs, Independent with gait, Independent with transfers, and Requires assistive device for independence  PATIENT GOALS: to get off my convertible [employer]), I want to run-I know I can't run, run, I want this leg (left) to be able to hop, skip, and jump  OBJECTIVE:  Note: Objective measures were completed at Evaluation unless otherwise noted.  DIAGNOSTIC FINDINGS: None updated or relevant to this POC  COGNITION: Overall cognitive status: her family says she will repeat stuff, possible short term memory impairments   SENSATION: Gets numbness in LLE at times, no tingling Decreased light touch sensation in LLE, tingling in distal LLE  COORDINATION: Impaired LLE  POSTURE: forward head and hunched shoulders  LOWER EXTREMITY ROM:   WFL  Active  Right Eval Left Eval  Hip flexion    Hip extension    Hip abduction    Hip adduction    Hip  internal rotation    Hip external rotation    Knee flexion    Knee extension    Ankle dorsiflexion    Ankle plantarflexion    Ankle inversion    Ankle eversion     (Blank rows = not tested)  LOWER EXTREMITY MMT:    MMT Right Eval Left Eval  Hip flexion 5 4+  Hip extension    Hip abduction    Hip adduction    Hip internal rotation    Hip external rotation    Knee flexion 5 4  Knee extension 5 4  Ankle dorsiflexion 5 4  Ankle plantarflexion    Ankle inversion    Ankle eversion    (Blank rows = not tested)  BED MOBILITY:  Mod I per pt report  TRANSFERS: Sit to stand: Modified independence  Assistive device utilized: None     Stand to sit: Modified independence  Assistive device utilized: None     Chair to chair: Modified independence  Assistive device utilized: None       RAMP:  Not tested  CURB:  Not tested  STAIRS: STAIRS:  Level of Assistance: CGA  Stair Negotiation Technique: Alternating Pattern  with Bilateral Rails  Number of Stairs: 4   Height of Stairs: 6  Comments: WFL   GAIT: Findings:  Gait pattern: decreased hip/knee flexion- Left and genu recurvatum- Left Distance walked: various clinic distances Assistive device utilized: Quad cane small base and None Level of assistance: Modified independence   FUNCTIONAL TESTS:         VITALS: There were no vitals filed for this visit.  TREATMENT:   TherAct  TherEx  NMR ***  PATIENT EDUCATION: Education details: Continued to encourage pt to replenish electrolytes and eat. Making sure pt brings in cane to next session. See above for education regarding clinical findings pf vestibular assessment *** Person educated: Patient Education method: Explanation, Demonstration, and Verbal cues Education comprehension: verbalized understanding, returned demonstration,  verbal cues required, and needs further education  HOME EXERCISE PROGRAM: Access Code: [email or web address] URL: [email or web address] Date: 08/16/2024 Prepared by: Marlon Plaster  Exercises - Side Stepping with Resistance at Ankles and Counter Support  - 1 x daily - 7 x weekly - 3 sets - 10 reps - Backward Monster Walk with Resistance at Ankles and Counter Support  - 1 x daily - 7 x weekly - 3 sets - 10 reps - Side Plank with Clam  - 1 x daily - 7 x weekly - 3 sets - 5-8 reps - Bear Hold   - 1 x daily - 7 x weekly - 3-5 sets - 10-15 second hold  GOALS: Goals reviewed with patient? No  SHORT TERM GOALS: Target date: 09/01/2024   Pt will be independent with initial HEP for improved strength, balance, transfers and gait. Baseline: Goal status: MET  2.  Pt will improve gait velocity to at least 2.5 ft/sec for improved gait efficiency and performance at mod I level  Baseline: 2.29 ft/sec mod I with SBQC (9/24); 2.72 ft/s w/SBQC (10/15) Goal status: MET  3.  Pt will improve 5 x STS to less than or equal to 16 seconds to demonstrate improved functional strength and transfer efficiency.  Baseline: 19.8 sec no UE (9/24); 14.12s no UE (10/15) Goal status: MET  4.  Pt will improve FGA to 22/30 for decreased fall risk  Baseline: 18/30 (9/24);  Goal status: INITIAL     LONG TERM GOALS: Target date: 09/22/2024   Pt will be independent with final HEP for improved strength, balance, transfers and gait. Baseline:  Goal status: INITIAL  2.  Pt will improve gait velocity to at least 3.0 ft/sec for improved gait efficiency and performance at mod I level  Baseline: 2.29 ft/sec mod I with SBQC (9/24); 2.72 ft/s w/SBQC (10/15) Goal status: REVISED   3.  Pt will improve 5 x STS to less than or equal to 13 seconds to demonstrate improved functional strength and transfer efficiency.  Baseline: 19.8 sec no UE (9/24); 14.12s no UE (10/15) Goal status: INITIAL  4.  Pt will improve FGA to  26/30 for decreased fall risk  Baseline: 18/30 (9/24) Goal status: INITIAL  5.  Pt will trial gait with no AD to determine if she would be safe to progress away from her [facility] Baseline:  Goal status: INITIAL    ASSESSMENT:  CLINICAL IMPRESSION:  Emphasis of skilled PT session*** Continue POC.   OBJECTIVE IMPAIRMENTS: Abnormal gait, decreased balance, decreased cognition, decreased coordination, decreased mobility, difficulty walking, decreased strength, impaired perceived functional ability, impaired sensation, postural dysfunction, and pain.   ACTIVITY LIMITATIONS: carrying, lifting, bending, squatting, and stairs  PARTICIPATION LIMITATIONS: driving and community activity  PERSONAL FACTORS: Time since onset of injury/illness/exacerbation and 3+ comorbidities:   HTN, seizures, migraines, anxiety, depression, chronic pain syndrome, fibromyalgia, GERD, hypothyroidism, peptic ulcer disease, G6PD deficiency, multinodular goiter, polyneuropathy, secondary adrenal insufficiency, multiple allergies, recurrent episodes of anaphylaxis, idiopathic urticaria are also affecting patient's functional outcome.   REHAB POTENTIAL: Good  CLINICAL DECISION MAKING: Evolving/moderate complexity  EVALUATION COMPLEXITY: Moderate  PLAN:  PT FREQUENCY: 2x/week  PT DURATION:  6 weeks  PLANNED INTERVENTIONS: [id number]- PT Re-evaluation, 97750- Physical Performance Testing, 97110-Therapeutic exercises, 97530- Therapeutic activity, [id number]- Neuromuscular re-education, 97535- Self Care, [id number]- Manual therapy, [phone]- Gait training, [phone]- Orthotic Initial, [phone]- Orthotic/Prosthetic subsequent, [phone]- Aquatic Therapy, [phone]- Electrical stimulation (manual), [phone] (1-2 muscles), [id number] (3+ muscles)- Dry Needling, Patient/Family education, Balance training, Stair training, Taping, Joint mobilization, Spinal manipulation, Vestibular training, Cognitive remediation, DME instructions, Cryotherapy, and Moist heat  PLAN  FOR NEXT SESSION: Finish FGA. continue education about not driving following seizures, trial gait with no AD, work on LLE NMR and strengthening (hip and knee flexion, address genu recurvatum), what HEP does she have from [facility]?, estim or Bioness? Work on stability without UE support, ankle/hip strategy. Dual tasking   Pt with more central dizziness signs as well as double vision, would want pt to see ophthalmologist first regarding double vision and then can add in vestibular sessions after with me if needed? - CG***   Waddell Southgate, PT Waddell Southgate, PT, DPT, CSRS  09/13/2024, 7:55 AM

## 2024-09-15 ENCOUNTER — Ambulatory Visit: Admitting: Physical Therapy

## 2024-09-15 VITALS — BP 115/75 | HR 83

## 2024-09-15 DIAGNOSIS — R2689 Other abnormalities of gait and mobility: Secondary | ICD-10-CM

## 2024-09-15 DIAGNOSIS — M6281 Muscle weakness (generalized): Secondary | ICD-10-CM | POA: Diagnosis not present

## 2024-09-15 DIAGNOSIS — R2681 Unsteadiness on feet: Secondary | ICD-10-CM

## 2024-09-15 NOTE — Therapy (Signed)
 OUTPATIENT PHYSICAL THERAPY NEURO TREATMENT   Patient Name: Kelly Haas MRN: [id number] DOB:[birth year], 53 y.o., female Today's Date: 09/15/2024   PCP: Jama Chow, MD REFERRING PROVIDER: Jama Chow, MD  END OF SESSION:  PT End of Session - 09/15/24 1023     Visit Number 9    Number of Visits 13   with eval   Date for Recertification  10/06/24   to allow for scheduling delays   Authorization Type Medicare    PT Start Time 1021   Pt arrived late   PT Stop Time 1108    PT Time Calculation (min) 47 min    Equipment Utilized During Treatment Gait belt    Activity Tolerance Patient limited by lethargy;Patient limited by pain    Behavior During Therapy Flat affect              Past Medical History:  Diagnosis Date   Abnormal cortisol level 05/01/2023   Abnormal thyroid  function test 05/01/2023   Anxiety disorder    Atypical squamous cell changes of undetermined significance (ASCUS) on vaginal cytology with positive high risk human papilloma virus (HPV) 09/11/2020   Formatting of this note might be different from the original. Many years ago before hyst cryo for abnormal pap 2021 ASCUS pap with pos HR HPV. Colpo-possible lesion- appears excoriated at the R cuff. Brushing shows inflammation with slightly positive  Ki67 index.10 days of metrogel. Recheck 3 weeks. Repeat pap in 6 mos. 03/2021 Nl pap with pos HR HPV   Bursitis, trochanteric 04/15/2013   Cardiac murmur 04/19/2021   Common migraine with intractable migraine 06/05/2021   Contracture of tendon sheath 11/26/2018   Depression with anxiety    Drug therapy 02/02/2018   Epistaxis 03/13/2016   Essential hypertension 04/19/2021   Fibromyalgia    G-6-PD deficiency 03/13/2016   GERD (gastroesophageal reflux disease)    History of adrenal insufficiency 07/14/2023   History of underactive thyroid  08/27/2022   Hyperemesis gravidarum 03/13/2016   Hyperlipidemia    Hypertension    Insomnia    Kidney stone    Laryngeal  trauma 07/23/2012   Long term current use of opiate analgesic 03/12/2013   Migraine    Multiple allergies 07/14/2023   Multiple joint pain 03/13/2016   Multiple myeloma (HCC)    Nontoxic multinodular goiter 08/27/2022   Obesity (BMI 35.0-39.9 without comorbidity) 04/19/2021   Onychomycosis due to dermatophyte 11/03/2018   Osteoarthritis of left hip 03/12/2013   Other reduced mobility 07/15/2023   Ovarian cyst 03/13/2016   Pain due to onychomycosis of toenail of left foot 02/19/2018   Pain syndrome, chronic 03/12/2013   Peripheral polyneuropathy 02/02/2018   Pharyngitis 03/13/2016   Positive ANA (antinuclear antibody) 03/13/2016   Renal insufficiency    Sacroiliac pain 03/12/2013   Secondary adrenal insufficiency 05/01/2023   Seizure disorder (HCC) 11/13/2017   Syncope and collapse 01/01/2016   Thoracic spondylosis 03/12/2013   Thyroid  disease    Urticaria    Vaginal bleeding 05/20/2019   Formatting of this note might be different from the original.  Prior hyst ~2005 for benign indications Bleeding 05/2019 Postcoital bleeding reported 08/2020   Vitamin D  insufficiency 05/01/2023   Voice hoarseness 02/02/2018   Past Surgical History:  Procedure Laterality Date   ABDOMINAL HYSTERECTOMY     APPENDECTOMY     CHOLECYSTECTOMY     ESOPHAGOGASTRODUODENOSCOPY  08/29/2017   Mild gastritis. Stauts post empiric esophageal dilitation.    TOE SURGERY Left    removed toe nail  TONSILLECTOMY AND ADENOIDECTOMY     Patient Active Problem List   Diagnosis Date Noted   Urticaria    Other reduced mobility 07/15/2023   History of adrenal insufficiency 07/14/2023   Multiple allergies 07/14/2023   Abnormal cortisol level 05/01/2023   Abnormal thyroid  function test 05/01/2023   Secondary adrenal insufficiency 05/01/2023   Vitamin D  insufficiency 05/01/2023   History of underactive thyroid  08/27/2022   Nontoxic multinodular goiter 08/27/2022   Anxiety disorder 06/26/2021   GERD  (gastroesophageal reflux disease) 06/26/2021   Hyperlipidemia 06/26/2021   Hypertension 06/26/2021   Common migraine with intractable migraine 06/05/2021   Depression with anxiety 06/01/2021   Fibromyalgia 06/01/2021   Kidney stone 06/01/2021   Migraine 06/01/2021   Thyroid  disease 06/01/2021   Essential hypertension 04/19/2021   Cardiac murmur 04/19/2021   Obesity (BMI 35.0-39.9 without comorbidity) 04/19/2021   Atypical squamous cell changes of undetermined significance (ASCUS) on vaginal cytology with positive high risk human papilloma virus (HPV) 09/11/2020   Vaginal bleeding 05/20/2019   Contracture of tendon sheath 11/26/2018   Onychomycosis due to dermatophyte 11/03/2018   Pain due to onychomycosis of toenail of left foot 02/19/2018   Drug therapy 02/02/2018   Peripheral polyneuropathy 02/02/2018   Voice hoarseness 02/02/2018   Seizure disorder (HCC) 11/13/2017   Epistaxis 03/13/2016   G-6-PD deficiency 03/13/2016   Hyperemesis gravidarum 03/13/2016   Insomnia 03/13/2016   Multiple joint pain 03/13/2016   Ovarian cyst 03/13/2016   Pharyngitis 03/13/2016   Positive ANA (antinuclear antibody) 03/13/2016   Syncope and collapse 01/01/2016   Bursitis, trochanteric 04/15/2013   Long term current use of opiate analgesic 03/12/2013   Osteoarthritis of left hip 03/12/2013   Pain syndrome, chronic 03/12/2013   Sacroiliac pain 03/12/2013   Thoracic spondylosis 03/12/2013   Laryngeal trauma 07/23/2012    ONSET DATE: 07/16/2024 (referral)   REFERRING DIAG: R53.1 (ICD-10-CM) - Weakness  THERAPY DIAG:  Muscle weakness (generalized)  Other abnormalities of gait and mobility  Unsteadiness on feet  Rationale for Evaluation and Treatment: Rehabilitation  SUBJECTIVE:                                                                                                                                                                                             SUBJECTIVE  STATEMENT: [name] w/[facility]. States she is fine today. No falls. Has been really nauseous, been taking Zofran . Did not take today as it makes her sleepy.   Tried to drink the LMNT but it tasted too much like a sports drink to her and she could not keep it down.   Has been having  pudding butt, had it this AM. Sees GI on 11/17. Has not eaten anything today but has a fruit cup for after today's session. Ate BBQ yesterday.   Has been working on her HEP as able, but difficult to do on her bed. Wants to get a yoga mat to do them on the floor.   Sees ophthalmology on 09/22/24. Denies double vision today.    Pt accompanied by: self, drove herself today   PERTINENT HISTORY: PMH: HTN, seizures, migraines, anxiety, depression, chronic pain syndrome, fibromyalgia, GERD, hypothyroidism, peptic ulcer disease, G6PD deficiency, multinodular goiter, polyneuropathy, secondary adrenal insufficiency, multiple allergies, recurrent episodes of anaphylaxis, idiopathic urticaria   PAIN:  Are you having pain? Yes: NPRS scale: 5-6/10 Pain location: Stomach Pain description: achy/cramping   PRECAUTIONS: Fall  RED FLAGS: None   WEIGHT BEARING RESTRICTIONS: No  FALLS: Has patient fallen in last 6 months? Yes. Number of falls frequently, unable to give further information   SEIZURES: Most recent one was 3 weeks ago, had a headache while driving and called her daughter who came to get her and she pulled over Lack of sleep and stress throw everything off  LIVING ENVIRONMENT: Lives with: lives alone Lives in: House/apartment Stairs: Yes: External: 2 steps; none; steps at back door, level entry at front door Has following equipment at home: Quad cane small base, Environmental Consultant - 4 wheeled, Wheelchair (manual), shower chair, and bed side commode - BSC sits over her toilet  PLOF: Independent with basic ADLs, Independent with gait, Independent with transfers, and Requires assistive device for  independence  PATIENT GOALS: to get off my convertible [facility]), I want to run-I know I can't run, run, I want this leg (left) to be able to hop, skip, and jump  OBJECTIVE:  Note: Objective measures were completed at Evaluation unless otherwise noted.  DIAGNOSTIC FINDINGS: None updated or relevant to this POC  COGNITION: Overall cognitive status: her family says she will repeat stuff, possible short term memory impairments   SENSATION: Gets numbness in LLE at times, no tingling Decreased light touch sensation in LLE, tingling in distal LLE  COORDINATION: Impaired LLE  POSTURE: forward head and hunched shoulders  LOWER EXTREMITY ROM:   WFL  Active  Right Eval Left Eval  Hip flexion    Hip extension    Hip abduction    Hip adduction    Hip internal rotation    Hip external rotation    Knee flexion    Knee extension    Ankle dorsiflexion    Ankle plantarflexion    Ankle inversion    Ankle eversion     (Blank rows = not tested)  LOWER EXTREMITY MMT:    MMT Right Eval Left Eval  Hip flexion 5 4+  Hip extension    Hip abduction    Hip adduction    Hip internal rotation    Hip external rotation    Knee flexion 5 4  Knee extension 5 4  Ankle dorsiflexion 5 4  Ankle plantarflexion    Ankle inversion    Ankle eversion    (Blank rows = not tested)  BED MOBILITY:  Mod I per pt report  TRANSFERS: Sit to stand: Modified independence  Assistive device utilized: None     Stand to sit: Modified independence  Assistive device utilized: None     Chair to chair: Modified independence  Assistive device utilized: None       RAMP:  Not tested  CURB:  Not tested  STAIRS: STAIRS:  Level of Assistance: CGA  Stair Negotiation Technique: Alternating Pattern  with Bilateral Rails  Number of Stairs: 4   Height of Stairs: 6  Comments: WFL   GAIT: Findings:  Gait pattern: decreased hip/knee flexion- Left and genu recurvatum- Left Distance walked: various  clinic distances Assistive device utilized: Quad cane small base Level of assistance: Modified independence   FUNCTIONAL TESTS:         VITALS: Vitals:   09/15/24 1033  BP: 115/75  Pulse: 83                                                                                                                                  TREATMENT:  Self-care/home management/Ther Act  Assessed vitals in LUE (see above) and WNL  Educated pt on trying a non-flavored LMNT to see if she can stomach this better than a flavored pack, as the flavors remind me of a sports drink.  SciFit multi-peaks level 5.5 for 8 minutes using BUE/BLEs for neural priming for reciprocal movement, dynamic cardiovascular warmup and increased amplitude of stepping. RPE of I am ok/10 following activity.  Lengthy discussion regarding importance of nutrition, hydration and consistency w/medications in order to see progress in PT. Pt very tearful during discussion, stating she feels as though she is starting over due to seizure ~6 weeks ago and potential to pause PT at end of POC as pt has been unable to tolerate activity due to GI distress. Informed pt that progressing to no AD is not possible nor safe if she is not eating or medically stable. Recommended pt look into medical transport offered through her insurance as well as pt aware that it is unsafe to be driving but does not have social support and does not want to rely on others for transportation. Provided therapeutic listening and encouragement regarding how far pt has come and a pause in PT does not change that, but pt is currently not benefiting from therapy and would benefit from medical stability. Pt in agreement with this.    PATIENT EDUCATION: Education details: See above  Person educated: Patient Education method: Explanation, Demonstration, and Verbal cues Education comprehension: verbalized understanding, returned demonstration, verbal cues required, and needs  further education  HOME EXERCISE PROGRAM: Access Code: [email or web address] URL: [email or web address] Date: 08/16/2024 Prepared by: Marlon Kerry Odonohue  Exercises - Side Stepping with Resistance at Ankles and Counter Support  - 1 x daily - 7 x weekly - 3 sets - 10 reps - Backward Monster Walk with Resistance at Ankles and Counter Support  - 1 x daily - 7 x weekly - 3 sets - 10 reps - Side Plank with Clam  - 1 x daily - 7 x weekly - 3 sets - 5-8 reps - Bear Hold   - 1 x daily - 7 x weekly - 3-5 sets - 10-15 second hold  GOALS: Goals reviewed with patient? No  SHORT TERM GOALS:  Target date: 09/01/2024   Pt will be independent with initial HEP for improved strength, balance, transfers and gait. Baseline: Goal status: MET  2.  Pt will improve gait velocity to at least 2.5 ft/sec for improved gait efficiency and performance at mod I level  Baseline: 2.29 ft/sec mod I with [facility] (9/24); 2.72 ft/s w/SB[facility] (10/15) Goal status: MET  3.  Pt will improve 5 x STS to less than or equal to 16 seconds to demonstrate improved functional strength and transfer efficiency.  Baseline: 19.8 sec no UE (9/24); 14.12s no UE (10/15) Goal status: MET  4.  Pt will improve FGA to 22/30 for decreased fall risk  Baseline: 18/30 (9/24);  Goal status: INITIAL     LONG TERM GOALS: Target date: 09/22/2024   Pt will be independent with final HEP for improved strength, balance, transfers and gait. Baseline:  Goal status: INITIAL  2.  Pt will improve gait velocity to at least 3.0 ft/sec for improved gait efficiency and performance at mod I level  Baseline: 2.29 ft/sec mod I with SB[facility] (9/24); 2.72 ft/s w/SB[facility] (10/15) Goal status: REVISED   3.  Pt will improve 5 x STS to less than or equal to 13 seconds to demonstrate improved functional strength and transfer efficiency.  Baseline: 19.8 sec no UE (9/24); 14.12s no UE (10/15) Goal status: INITIAL  4.  Pt will improve FGA to 26/30 for decreased fall risk   Baseline: 18/30 (9/24) Goal status: INITIAL  5.  Pt will trial gait with no AD to determine if she would be safe to progress away from her [facility] Baseline:  Goal status: INITIAL    ASSESSMENT:  CLINICAL IMPRESSION:  Emphasis of skilled PT session on endurance and pt education regarding PT POC and safety. Pt presented w/[facility] today and was tearful during session due to feeling as though she is starting over. Pt aware that she should not be driving due to recent seizure but reports she has no family support and does not want to rely on others to drive. Recommended pt look into transportation services that are offered through her insurance. Pt continues to have diarrhea and reports not eating for days at a time, resulting in high levels of lethargy, pain and inability to tolerate therapy session. For this reason, recommended pt pause PT at end of POC to focus on medical management and will resume PT when she is more stable. Pt verbalized understanding and agreement. Also encouraged pt to continue using cane at all times until she begins feeling better, as pt more unsteady due to GI distress. Continue POC.   OBJECTIVE IMPAIRMENTS: Abnormal gait, decreased balance, decreased cognition, decreased coordination, decreased mobility, difficulty walking, decreased strength, impaired perceived functional ability, impaired sensation, postural dysfunction, and pain.   ACTIVITY LIMITATIONS: carrying, lifting, bending, squatting, and stairs  PARTICIPATION LIMITATIONS: driving and community activity  PERSONAL FACTORS: Time since onset of injury/illness/exacerbation and 3+ comorbidities:   HTN, seizures, migraines, anxiety, depression, chronic pain syndrome, fibromyalgia, GERD, hypothyroidism, peptic ulcer disease, G6PD deficiency, multinodular goiter, polyneuropathy, secondary adrenal insufficiency, multiple allergies, recurrent episodes of anaphylaxis, idiopathic urticaria are also affecting patient's  functional outcome.   REHAB POTENTIAL: Good  CLINICAL DECISION MAKING: Evolving/moderate complexity  EVALUATION COMPLEXITY: Moderate  PLAN:  PT FREQUENCY: 2x/week  PT DURATION: 6 weeks  PLANNED INTERVENTIONS: [id number]- PT Re-evaluation, 97750- Physical Performance Testing, 97110-Therapeutic exercises, 97530- Therapeutic activity, [id number]- Neuromuscular re-education, 97535- Self Care, [id number]- Manual therapy, [id number]- Gait training, [id number]- Orthotic Initial, [id number]- Orthotic/Prosthetic subsequent, [id number]-  Aquatic Therapy, [id number]- Electrical stimulation (manual), [id number] (1-2 muscles), [id number] (3+ muscles)- Dry Needling, Patient/Family education, Balance training, Stair training, Taping, Joint mobilization, Spinal manipulation, Vestibular training, Cognitive remediation, DME instructions, Cryotherapy, and Moist heat  PLAN FOR NEXT SESSION: Finish FGA (I would use cane so maybe restart it?) and 10th visit PN. Mat exercises. continue education about not driving following seizures, trial gait with no AD, work on LLE NMR and strengthening (hip and knee flexion, address genu recurvatum), what HEP does she have from [facility]?, estim or [facility]? Work on stability without UE support, ankle/hip strategy. Dual tasking   Pt with more central dizziness signs as well as double vision, would want pt to see ophthalmologist first regarding double vision and then can add in vestibular sessions after with me if needed? - CG   Christon Parada E Delbra Zellars, PT, DPT 09/15/2024, 12:23 PM

## 2024-09-20 ENCOUNTER — Ambulatory Visit: Attending: Internal Medicine | Admitting: Physical Therapy

## 2024-09-20 VITALS — BP 126/70 | HR 72

## 2024-09-20 DIAGNOSIS — M6281 Muscle weakness (generalized): Secondary | ICD-10-CM | POA: Diagnosis present

## 2024-09-20 DIAGNOSIS — R2689 Other abnormalities of gait and mobility: Secondary | ICD-10-CM | POA: Diagnosis present

## 2024-09-20 DIAGNOSIS — R42 Dizziness and giddiness: Secondary | ICD-10-CM | POA: Diagnosis present

## 2024-09-20 DIAGNOSIS — R2681 Unsteadiness on feet: Secondary | ICD-10-CM | POA: Diagnosis present

## 2024-09-20 NOTE — Therapy (Signed)
 OUTPATIENT PHYSICAL THERAPY NEURO TREATMENT - 10th VISIT PROGRESS NOTE   Patient Name: Kelly Haas MRN: [id number] DOB:[birth year], 53 y.o., female Today's Date: 09/20/2024   PCP: Kelly Chow, MD REFERRING PROVIDER: Jama Chow, MD  Physical Therapy Progress Note   Dates of Reporting Period: 08/11/2024 - 09/20/2024  See Note below for Objective Data and Assessment of Progress/Goals.  Thank you for the referral of this patient. Kelly Haas, PT, DPT, CSRS   END OF SESSION:  PT End of Session - 09/20/24 1105     Visit Number 10    Number of Visits 13   with eval   Date for Recertification  10/06/24   to allow for scheduling delays   Authorization Type Medicare    PT Start Time 1104   pt arrived late   PT Stop Time 1144    PT Time Calculation (min) 40 min    Equipment Utilized During Treatment Gait belt    Activity Tolerance Patient tolerated treatment well    Behavior During Therapy Flat affect               Past Medical History:  Diagnosis Date   Abnormal cortisol level 05/01/2023   Abnormal thyroid  function test 05/01/2023   Anxiety disorder    Atypical squamous cell changes of undetermined significance (ASCUS) on vaginal cytology with positive high risk human papilloma virus (HPV) 09/11/2020   Formatting of this note might be different from the original. Many years ago before hyst cryo for abnormal pap 2021 ASCUS pap with pos HR HPV. Colpo-possible lesion- appears excoriated at the R cuff. Brushing shows inflammation with slightly positive  Ki67 index.10 days of metrogel. Recheck 3 weeks. Repeat pap in 6 mos. 03/2021 Nl pap with pos HR HPV   Bursitis, trochanteric 04/15/2013   Cardiac murmur 04/19/2021   Common migraine with intractable migraine 06/05/2021   Contracture of tendon sheath 11/26/2018   Depression with anxiety    Drug therapy 02/02/2018   Epistaxis 03/13/2016   Essential hypertension 04/19/2021   Fibromyalgia    G-6-PD deficiency 03/13/2016    GERD (gastroesophageal reflux disease)    History of adrenal insufficiency 07/14/2023   History of underactive thyroid  08/27/2022   Hyperemesis gravidarum 03/13/2016   Hyperlipidemia    Hypertension    Insomnia    Kidney stone    Laryngeal trauma 07/23/2012   Long term current use of opiate analgesic 03/12/2013   Migraine    Multiple allergies 07/14/2023   Multiple joint pain 03/13/2016   Multiple myeloma (HCC)    Nontoxic multinodular goiter 08/27/2022   Obesity (BMI 35.0-39.9 without comorbidity) 04/19/2021   Onychomycosis due to dermatophyte 11/03/2018   Osteoarthritis of left hip 03/12/2013   Other reduced mobility 07/15/2023   Ovarian cyst 03/13/2016   Pain due to onychomycosis of toenail of left foot 02/19/2018   Pain syndrome, chronic 03/12/2013   Peripheral polyneuropathy 02/02/2018   Pharyngitis 03/13/2016   Positive ANA (antinuclear antibody) 03/13/2016   Renal insufficiency    Sacroiliac pain 03/12/2013   Secondary adrenal insufficiency 05/01/2023   Seizure disorder (HCC) 11/13/2017   Syncope and collapse 01/01/2016   Thoracic spondylosis 03/12/2013   Thyroid  disease    Urticaria    Vaginal bleeding 05/20/2019   Formatting of this note might be different from the original.  Prior hyst ~2005 for benign indications Bleeding 05/2019 Postcoital bleeding reported 08/2020   Vitamin D  insufficiency 05/01/2023   Voice hoarseness 02/02/2018   Past Surgical History:  Procedure Laterality  Date   ABDOMINAL HYSTERECTOMY     APPENDECTOMY     CHOLECYSTECTOMY     ESOPHAGOGASTRODUODENOSCOPY  08/29/2017   Mild gastritis. Stauts post empiric esophageal dilitation.    TOE SURGERY Left    removed toe nail   TONSILLECTOMY AND ADENOIDECTOMY     Patient Active Problem List   Diagnosis Date Noted   Urticaria    Other reduced mobility 07/15/2023   History of adrenal insufficiency 07/14/2023   Multiple allergies 07/14/2023   Abnormal cortisol level 05/01/2023   Abnormal thyroid   function test 05/01/2023   Secondary adrenal insufficiency 05/01/2023   Vitamin D  insufficiency 05/01/2023   History of underactive thyroid  08/27/2022   Nontoxic multinodular goiter 08/27/2022   Anxiety disorder 06/26/2021   GERD (gastroesophageal reflux disease) 06/26/2021   Hyperlipidemia 06/26/2021   Hypertension 06/26/2021   Common migraine with intractable migraine 06/05/2021   Depression with anxiety 06/01/2021   Fibromyalgia 06/01/2021   Kidney stone 06/01/2021   Migraine 06/01/2021   Thyroid  disease 06/01/2021   Essential hypertension 04/19/2021   Cardiac murmur 04/19/2021   Obesity (BMI 35.0-39.9 without comorbidity) 04/19/2021   Atypical squamous cell changes of undetermined significance (ASCUS) on vaginal cytology with positive high risk human papilloma virus (HPV) 09/11/2020   Vaginal bleeding 05/20/2019   Contracture of tendon sheath 11/26/2018   Onychomycosis due to dermatophyte 11/03/2018   Pain due to onychomycosis of toenail of left foot 02/19/2018   Drug therapy 02/02/2018   Peripheral polyneuropathy 02/02/2018   Voice hoarseness 02/02/2018   Seizure disorder (HCC) 11/13/2017   Epistaxis 03/13/2016   G-6-PD deficiency 03/13/2016   Hyperemesis gravidarum 03/13/2016   Insomnia 03/13/2016   Multiple joint pain 03/13/2016   Ovarian cyst 03/13/2016   Pharyngitis 03/13/2016   Positive ANA (antinuclear antibody) 03/13/2016   Syncope and collapse 01/01/2016   Bursitis, trochanteric 04/15/2013   Long term current use of opiate analgesic 03/12/2013   Osteoarthritis of left hip 03/12/2013   Pain syndrome, chronic 03/12/2013   Sacroiliac pain 03/12/2013   Thoracic spondylosis 03/12/2013   Laryngeal trauma 07/23/2012    ONSET DATE: 07/16/2024 (referral)   REFERRING DIAG: R53.1 (ICD-10-CM) - Weakness  THERAPY DIAG:  Muscle weakness (generalized)  Other abnormalities of gait and mobility  Unsteadiness on feet  Dizziness and giddiness  Rationale for  Evaluation and Treatment: Rehabilitation  SUBJECTIVE:                                                                                                                                                                                             SUBJECTIVE STATEMENT: Kelly Haas w/SBQC. Pt denies any acute  changes since last visit, no falls. Pt went to [place] over the weekend for a birthday party.  Pt was woken up this morning by a pain in her L posterior shoulder region, she couldn't even reach out to do her hair. Hot water in the shower did not help her pain.   Pt accompanied by: self, drove herself today   PERTINENT HISTORY: PMH: HTN, seizures, migraines, anxiety, depression, chronic pain syndrome, fibromyalgia, GERD, hypothyroidism, peptic ulcer disease, G6PD deficiency, multinodular goiter, polyneuropathy, secondary adrenal insufficiency, multiple allergies, recurrent episodes of anaphylaxis, idiopathic urticaria   PAIN:  Are you having pain? Yes: NPRS scale: 5-6/10 Pain location: Stomach Pain description: achy/cramping   PRECAUTIONS: Fall  RED FLAGS: None   WEIGHT BEARING RESTRICTIONS: No  FALLS: Has patient fallen in last 6 months? Yes. Number of falls frequently, unable to give further information   SEIZURES: Most recent one was 3 weeks ago, had a headache while driving and called her daughter who came to get her and she pulled over Lack of sleep and stress throw everything off  LIVING ENVIRONMENT: Lives with: lives alone Lives in: House/apartment Stairs: Yes: External: 2 steps; none; steps at back door, level entry at front door Has following equipment at home: Quad cane small base, [employer] - 4 wheeled, Wheelchair (manual), shower chair, and bed side commode - BSC sits over her toilet  PLOF: Independent with basic ADLs, Independent with gait, Independent with transfers, and Requires assistive device for independence  PATIENT GOALS: to get off my convertible  [facility]), I want to run-I know I can't run, run, I want this leg (left) to be able to hop, skip, and jump  OBJECTIVE:  Note: Objective measures were completed at Evaluation unless otherwise noted.  DIAGNOSTIC FINDINGS: None updated or relevant to this POC  COGNITION: Overall cognitive status: her family says she will repeat stuff, possible short term memory impairments   SENSATION: Gets numbness in LLE at times, no tingling Decreased light touch sensation in LLE, tingling in distal LLE  COORDINATION: Impaired LLE  POSTURE: forward head and hunched shoulders  LOWER EXTREMITY ROM:   WFL  Active  Right Eval Left Eval  Hip flexion    Hip extension    Hip abduction    Hip adduction    Hip internal rotation    Hip external rotation    Knee flexion    Knee extension    Ankle dorsiflexion    Ankle plantarflexion    Ankle inversion    Ankle eversion     (Blank rows = not tested)  LOWER EXTREMITY MMT:    MMT Right Eval Left Eval  Hip flexion 5 4+  Hip extension    Hip abduction    Hip adduction    Hip internal rotation    Hip external rotation    Knee flexion 5 4  Knee extension 5 4  Ankle dorsiflexion 5 4  Ankle plantarflexion    Ankle inversion    Ankle eversion    (Blank rows = not tested)  BED MOBILITY:  Mod I per pt report  TRANSFERS: Sit to stand: Modified independence  Assistive device utilized: None     Stand to sit: Modified independence  Assistive device utilized: None     Chair to chair: Modified independence  Assistive device utilized: None       RAMP:  Not tested  CURB:  Not tested  STAIRS: STAIRS:  Level of Assistance: CGA  Stair Negotiation Technique: Alternating Pattern  with  Bilateral Rails  Number of Stairs: 4   Height of Stairs: 6  Comments: WFL   GAIT: Findings:  Gait pattern: decreased hip/knee flexion- Left and genu recurvatum- Left Distance walked: various clinic distances Assistive device utilized: Quad cane small  base Level of assistance: Modified independence   FUNCTIONAL TESTS:    [facility] PT Assessment - 09/20/24 1125       Functional Gait  Assessment   Gait assessed  Yes    Gait Level Surface Walks 20 ft, slow speed, abnormal gait pattern, evidence for imbalance or deviates 10-15 in outside of the 12 in walkway width. Requires more than 7 sec to ambulate 20 ft.    Change in Gait Speed Able to change speed, demonstrates mild gait deviations, deviates 6-10 in outside of the 12 in walkway width, or no gait deviations, unable to achieve a major change in velocity, or uses a change in velocity, or uses an assistive device.    Gait with Horizontal Head Turns Performs head turns smoothly with slight change in gait velocity (eg, minor disruption to smooth gait path), deviates 6-10 in outside 12 in walkway width, or uses an assistive device.    Gait with Vertical Head Turns Performs task with slight change in gait velocity (eg, minor disruption to smooth gait path), deviates 6 - 10 in outside 12 in walkway width or uses assistive device    Gait and Pivot Turn Turns slowly, requires verbal cueing, or requires several small steps to catch balance following turn and stop    Step Over Obstacle Is able to step over one shoe box (4.5 in total height) but must slow down and adjust steps to clear box safely. May require verbal cueing.    Gait with Narrow Base of Support Is able to ambulate for 10 steps heel to toe with no staggering.   with [facility]   Gait with Eyes Closed Walks 20 ft, uses assistive device, slower speed, mild gait deviations, deviates 6-10 in outside 12 in walkway width. Ambulates 20 ft in less than 9 sec but greater than 7 sec.    Ambulating Backwards Walks 20 ft, uses assistive device, slower speed, mild gait deviations, deviates 6-10 in outside 12 in walkway width.    Steps Alternating feet, must use rail.    Total Score 18    FGA comment: 18/30, high fall risk              VITALS: Vitals:    09/20/24 1115  BP: 126/70  Pulse: 72                                                                                                                                   TREATMENT:  Self-care/home management/Ther Act  Assessed vitals in LUE (see above) and WNL  No stretch felt in painful area with UT or levator scap stretch Demonstrated how to use theracane to address trigger points Encouraged  her to continue to use tennis ball for trigger point release To work on strengthening at [employer] for safety: Bridges x 10 reps with 5 sec hold SL bridge x 10 reps B with 5 sec hold Supine 90/90 toe taps/marches   Added to HEP, see bolded below   Physical Performance For 10th visit PN and STG assessment:  [facility] PT Assessment - 09/20/24 1125       Functional Gait  Assessment   Gait assessed  Yes    Gait Level Surface Walks 20 ft, slow speed, abnormal gait pattern, evidence for imbalance or deviates 10-15 in outside of the 12 in walkway width. Requires more than 7 sec to ambulate 20 ft.    Change in Gait Speed Able to change speed, demonstrates mild gait deviations, deviates 6-10 in outside of the 12 in walkway width, or no gait deviations, unable to achieve a major change in velocity, or uses a change in velocity, or uses an assistive device.    Gait with Horizontal Head Turns Performs head turns smoothly with slight change in gait velocity (eg, minor disruption to smooth gait path), deviates 6-10 in outside 12 in walkway width, or uses an assistive device.    Gait with Vertical Head Turns Performs task with slight change in gait velocity (eg, minor disruption to smooth gait path), deviates 6 - 10 in outside 12 in walkway width or uses assistive device    Gait and Pivot Turn Turns slowly, requires verbal cueing, or requires several small steps to catch balance following turn and stop    Step Over Obstacle Is able to step over one shoe box (4.5 in total height) but must slow down and adjust steps  to clear box safely. May require verbal cueing.    Gait with Narrow Base of Support Is able to ambulate for 10 steps heel to toe with no staggering.   with [facility]   Gait with Eyes Closed Walks 20 ft, uses assistive device, slower speed, mild gait deviations, deviates 6-10 in outside 12 in walkway width. Ambulates 20 ft in less than 9 sec but greater than 7 sec.    Ambulating Backwards Walks 20 ft, uses assistive device, slower speed, mild gait deviations, deviates 6-10 in outside 12 in walkway width.    Steps Alternating feet, must use rail.    Total Score 18    FGA comment: 18/30, high fall risk            PATIENT EDUCATION: Education details: added to HEP, results of OM and functional implications Person educated: Patient Education method: Explanation, Demonstration, Verbal cues, and Handouts Education comprehension: verbalized understanding, returned demonstration, verbal cues required, and needs further education  HOME EXERCISE PROGRAM: Access Code: [email or web address] URL: [email or web address] Date: 08/16/2024 Prepared by: Marlon Plaster  Exercises - Side Stepping with Resistance at Ankles and Counter Support  - 1 x daily - 7 x weekly - 3 sets - 10 reps - Backward Monster Walk with Resistance at Ankles and Counter Support  - 1 x daily - 7 x weekly - 3 sets - 10 reps - Side Plank with Clam  - 1 x daily - 7 x weekly - 3 sets - 5-8 reps - Bear Hold   - 1 x daily - 7 x weekly - 3-5 sets - 10-15 second hold - Supine Bridge  - 1 x daily - 7 x weekly - 3 sets - 10 reps - 5 sec hold - Single Leg Bridge  - 1 x daily -  7 x weekly - 3 sets - 10 reps - 5 sec hold - Supine 90/90 Alternating Toe Touch  - 1 x daily - 7 x weekly - 3 sets - 10 reps  GOALS: Goals reviewed with patient? No  SHORT TERM GOALS: Target date: 09/01/2024   Pt will be independent with initial HEP for improved strength, balance, transfers and gait. Baseline: Goal status: MET  2.  Pt will improve gait  velocity to at least 2.5 ft/sec for improved gait efficiency and performance at mod I level  Baseline: 2.29 ft/sec mod I with SBQC (9/24); 2.72 ft/s w/SBQC (10/15) Goal status: MET  3.  Pt will improve 5 x STS to less than or equal to 16 seconds to demonstrate improved functional strength and transfer efficiency.  Baseline: 19.8 sec no UE (9/24); 14.12s no UE (10/15) Goal status: MET  4.  Pt will improve FGA to 22/30 for decreased fall risk  Baseline: 18/30 (9/24); 18/30 (11/3) Goal status: NOT MET     LONG TERM GOALS: Target date: 09/22/2024   Pt will be independent with final HEP for improved strength, balance, transfers and gait. Baseline:  Goal status: INITIAL  2.  Pt will improve gait velocity to at least 3.0 ft/sec for improved gait efficiency and performance at mod I level  Baseline: 2.29 ft/sec mod I with SBQC (9/24); 2.72 ft/s w/SBQC (10/15) Goal status: REVISED   3.  Pt will improve 5 x STS to less than or equal to 13 seconds to demonstrate improved functional strength and transfer efficiency.  Baseline: 19.8 sec no UE (9/24); 14.12s no UE (10/15) Goal status: INITIAL  4.  Pt will improve FGA to 26/30 for decreased fall risk  Baseline: 18/30 (9/24) Goal status: INITIAL  5.  Pt will trial gait with no AD to determine if she would be safe to progress away from her [facility] Baseline:  Goal status: INITIAL    ASSESSMENT:  CLINICAL IMPRESSION:  Emphasis of skilled PT session on continuing to monitor vitals, addressing new onset of LUT pain, assessing FGA for 10th visit PN, and working on mat level exercises to improve LE and core strength. Pt's FGA score unchanged from initial evaluation, progress likely limited due to decreased medical stability. Continue to plan to d/c from OPPT services at end of this POC until she is more medically stable. Continue POC.   OBJECTIVE IMPAIRMENTS: Abnormal gait, decreased balance, decreased cognition, decreased coordination, decreased  mobility, difficulty walking, decreased strength, impaired perceived functional ability, impaired sensation, postural dysfunction, and pain.   ACTIVITY LIMITATIONS: carrying, lifting, bending, squatting, and stairs  PARTICIPATION LIMITATIONS: driving and community activity  PERSONAL FACTORS: Time since onset of injury/illness/exacerbation and 3+ comorbidities:   HTN, seizures, migraines, anxiety, depression, chronic pain syndrome, fibromyalgia, GERD, hypothyroidism, peptic ulcer disease, G6PD deficiency, multinodular goiter, polyneuropathy, secondary adrenal insufficiency, multiple allergies, recurrent episodes of anaphylaxis, idiopathic urticaria are also affecting patient's functional outcome.   REHAB POTENTIAL: Good  CLINICAL DECISION MAKING: Evolving/moderate complexity  EVALUATION COMPLEXITY: Moderate  PLAN:  PT FREQUENCY: 2x/week  PT DURATION: 6 weeks  PLANNED INTERVENTIONS: [id number]- PT Re-evaluation, 97750- Physical Performance Testing, 97110-Therapeutic exercises, 97530- Therapeutic activity, [id number]- Neuromuscular re-education, 97535- Self Care, [id number]- Manual therapy, [id number]- Gait training, [phone]- Orthotic Initial, [phone]- Orthotic/Prosthetic subsequent, [phone]- Aquatic Therapy, [phone]- Electrical stimulation (manual), [phone] (1-2 muscles), [id number] (3+ muscles)- Dry Needling, Patient/Family education, Balance training, Stair training, Taping, Joint mobilization, Spinal manipulation, Vestibular training, Cognitive remediation, DME instructions, Cryotherapy, and Moist heat  PLAN FOR NEXT  SESSION: Mat exercises. continue education about not driving following seizures, trial gait with no AD, work on LLE NMR and strengthening (hip and knee flexion, address genu recurvatum), what HEP does she have from [facility]?, estim or [facility]? Work on stability without UE support, ankle/hip strategy. Dual tasking   Pt with more central dizziness signs as well as double vision, would want pt to see  ophthalmologist first regarding double vision and then can add in vestibular sessions after with me if needed? - CG   Kelly Haas, PT Kelly Haas, PT, DPT, CSRS  09/20/2024, 1:09 PM

## 2024-09-22 ENCOUNTER — Ambulatory Visit: Admitting: Physical Therapy

## 2024-09-22 VITALS — BP 109/67 | HR 63

## 2024-09-22 DIAGNOSIS — M6281 Muscle weakness (generalized): Secondary | ICD-10-CM | POA: Diagnosis not present

## 2024-09-22 DIAGNOSIS — R2689 Other abnormalities of gait and mobility: Secondary | ICD-10-CM

## 2024-09-22 DIAGNOSIS — R2681 Unsteadiness on feet: Secondary | ICD-10-CM

## 2024-09-22 NOTE — Therapy (Signed)
 OUTPATIENT PHYSICAL THERAPY NEURO TREATMENT    Patient Name: Kelly Haas MRN: [id number] DOB:11-09-1971, 53 y.o., female Today's Date: 09/22/2024   PCP: Kelly Chow, MD REFERRING PROVIDER: Jama Chow, MD   END OF SESSION:  PT End of Session - 09/22/24 1019     Visit Number 11    Number of Visits 13   with eval   Date for Recertification  10/06/24   to allow for scheduling delays   Authorization Type Medicare    PT Start Time 1018    PT Stop Time 1058    PT Time Calculation (min) 40 min    Equipment Utilized During Treatment Gait belt    Activity Tolerance Patient tolerated treatment well;Other (comment)   Dilated eyes   Behavior During Therapy Flat affect;WFL for tasks assessed/performed               Past Medical History:  Diagnosis Date   Abnormal cortisol level 05/01/2023   Abnormal thyroid  function test 05/01/2023   Anxiety disorder    Atypical squamous cell changes of undetermined significance (ASCUS) on vaginal cytology with positive high risk human papilloma virus (HPV) 09/11/2020   Formatting of this note might be different from the original. Many years ago before hyst cryo for abnormal pap 2021 ASCUS pap with pos HR HPV. Colpo-possible lesion- appears excoriated at the R cuff. Brushing shows inflammation with slightly positive  Ki67 index.10 days of metrogel. Recheck 3 weeks. Repeat pap in 6 mos. 03/2021 Nl pap with pos HR HPV   Bursitis, trochanteric 04/15/2013   Cardiac murmur 04/19/2021   Common migraine with intractable migraine 06/05/2021   Contracture of tendon sheath 11/26/2018   Depression with anxiety    Drug therapy 02/02/2018   Epistaxis 03/13/2016   Essential hypertension 04/19/2021   Fibromyalgia    G-6-PD deficiency 03/13/2016   GERD (gastroesophageal reflux disease)    History of adrenal insufficiency 07/14/2023   History of underactive thyroid  08/27/2022   Hyperemesis gravidarum 03/13/2016   Hyperlipidemia    Hypertension    Insomnia     Kidney stone    Laryngeal trauma 07/23/2012   Long term current use of opiate analgesic 03/12/2013   Migraine    Multiple allergies 07/14/2023   Multiple joint pain 03/13/2016   Multiple myeloma (HCC)    Nontoxic multinodular goiter 08/27/2022   Obesity (BMI 35.0-39.9 without comorbidity) 04/19/2021   Onychomycosis due to dermatophyte 11/03/2018   Osteoarthritis of left hip 03/12/2013   Other reduced mobility 07/15/2023   Ovarian cyst 03/13/2016   Pain due to onychomycosis of toenail of left foot 02/19/2018   Pain syndrome, chronic 03/12/2013   Peripheral polyneuropathy 02/02/2018   Pharyngitis 03/13/2016   Positive ANA (antinuclear antibody) 03/13/2016   Renal insufficiency    Sacroiliac pain 03/12/2013   Secondary adrenal insufficiency 05/01/2023   Seizure disorder (HCC) 11/13/2017   Syncope and collapse 01/01/2016   Thoracic spondylosis 03/12/2013   Thyroid  disease    Urticaria    Vaginal bleeding 05/20/2019   Formatting of this note might be different from the original.  Prior hyst ~2005 for benign indications Bleeding 05/2019 Postcoital bleeding reported 08/2020   Vitamin D  insufficiency 05/01/2023   Voice hoarseness 02/02/2018   Past Surgical History:  Procedure Laterality Date   ABDOMINAL HYSTERECTOMY     APPENDECTOMY     CHOLECYSTECTOMY     ESOPHAGOGASTRODUODENOSCOPY  08/29/2017   Mild gastritis. Stauts post empiric esophageal dilitation.    TOE SURGERY Left  removed toe nail   TONSILLECTOMY AND ADENOIDECTOMY     Patient Active Problem List   Diagnosis Date Noted   Urticaria    Other reduced mobility 07/15/2023   History of adrenal insufficiency 07/14/2023   Multiple allergies 07/14/2023   Abnormal cortisol level 05/01/2023   Abnormal thyroid  function test 05/01/2023   Secondary adrenal insufficiency 05/01/2023   Vitamin D  insufficiency 05/01/2023   History of underactive thyroid  08/27/2022   Nontoxic multinodular goiter 08/27/2022   Anxiety disorder  06/26/2021   GERD (gastroesophageal reflux disease) 06/26/2021   Hyperlipidemia 06/26/2021   Hypertension 06/26/2021   Common migraine with intractable migraine 06/05/2021   Depression with anxiety 06/01/2021   Fibromyalgia 06/01/2021   Kidney stone 06/01/2021   Migraine 06/01/2021   Thyroid  disease 06/01/2021   Essential hypertension 04/19/2021   Cardiac murmur 04/19/2021   Obesity (BMI 35.0-39.9 without comorbidity) 04/19/2021   Atypical squamous cell changes of undetermined significance (ASCUS) on vaginal cytology with positive high risk human papilloma virus (HPV) 09/11/2020   Vaginal bleeding 05/20/2019   Contracture of tendon sheath 11/26/2018   Onychomycosis due to dermatophyte 11/03/2018   Pain due to onychomycosis of toenail of left foot 02/19/2018   Drug therapy 02/02/2018   Peripheral polyneuropathy 02/02/2018   Voice hoarseness 02/02/2018   Seizure disorder (HCC) 11/13/2017   Epistaxis 03/13/2016   G-6-PD deficiency 03/13/2016   Hyperemesis gravidarum 03/13/2016   Insomnia 03/13/2016   Multiple joint pain 03/13/2016   Ovarian cyst 03/13/2016   Pharyngitis 03/13/2016   Positive ANA (antinuclear antibody) 03/13/2016   Syncope and collapse 01/01/2016   Bursitis, trochanteric 04/15/2013   Long term current use of opiate analgesic 03/12/2013   Osteoarthritis of left hip 03/12/2013   Pain syndrome, chronic 03/12/2013   Sacroiliac pain 03/12/2013   Thoracic spondylosis 03/12/2013   Laryngeal trauma 07/23/2012    ONSET DATE: 07/16/2024 (referral)   REFERRING DIAG: R53.1 (ICD-10-CM) - Weakness  THERAPY DIAG:  Muscle weakness (generalized)  Other abnormalities of gait and mobility  Unsteadiness on feet  Rationale for Evaluation and Treatment: Rehabilitation  SUBJECTIVE:                                                                                                                                                                                              SUBJECTIVE STATEMENT: Kelly Haas w/SBQC. States she saw the ophthalmologist this AM and had her eyes dilated. Still drove herself here. Is being referred to neurophthalmology for something with my eyelids. Forgot to tell MD about double vision.   Denies falls or pain today. Ate a mini pancake before session today.  Pt accompanied by: self, drove herself today   PERTINENT HISTORY: PMH: HTN, seizures, migraines, anxiety, depression, chronic pain syndrome, fibromyalgia, GERD, hypothyroidism, peptic ulcer disease, G6PD deficiency, multinodular goiter, polyneuropathy, secondary adrenal insufficiency, multiple allergies, recurrent episodes of anaphylaxis, idiopathic urticaria   PAIN:  Are you having pain? No  PRECAUTIONS: Fall  RED FLAGS: None   WEIGHT BEARING RESTRICTIONS: No  FALLS: Has patient fallen in last 6 months? Yes. Number of falls frequently, unable to give further information   SEIZURES: Most recent one was 3 weeks ago, had a headache while driving and called her daughter who came to get her and she pulled over Lack of sleep and stress throw everything off  LIVING ENVIRONMENT: Lives with: lives alone Lives in: House/apartment Stairs: Yes: External: 2 steps; none; steps at back door, level entry at front door Has following equipment at home: Quad cane small base, Environmental Consultant - 4 wheeled, Wheelchair (manual), shower chair, and bed side commode - BSC sits over her toilet  PLOF: Independent with basic ADLs, Independent with gait, Independent with transfers, and Requires assistive device for independence  PATIENT GOALS: to get off my convertible [facility]), I want to run-I know I can't run, run, I want this leg (left) to be able to hop, skip, and jump  OBJECTIVE:  Note: Objective measures were completed at Evaluation unless otherwise noted.  DIAGNOSTIC FINDINGS: None updated or relevant to this POC  COGNITION: Overall cognitive status: her family says she  will repeat stuff, possible short term memory impairments   SENSATION: Gets numbness in LLE at times, no tingling Decreased light touch sensation in LLE, tingling in distal LLE  COORDINATION: Impaired LLE  POSTURE: forward head and hunched shoulders  LOWER EXTREMITY ROM:   WFL  Active  Right Eval Left Eval  Hip flexion    Hip extension    Hip abduction    Hip adduction    Hip internal rotation    Hip external rotation    Knee flexion    Knee extension    Ankle dorsiflexion    Ankle plantarflexion    Ankle inversion    Ankle eversion     (Blank rows = not tested)  LOWER EXTREMITY MMT:    MMT Right Eval Left Eval  Hip flexion 5 4+  Hip extension    Hip abduction    Hip adduction    Hip internal rotation    Hip external rotation    Knee flexion 5 4  Knee extension 5 4  Ankle dorsiflexion 5 4  Ankle plantarflexion    Ankle inversion    Ankle eversion    (Blank rows = not tested)  BED MOBILITY:  Mod I per pt report  TRANSFERS: Sit to stand: Modified independence  Assistive device utilized: None     Stand to sit: Modified independence  Assistive device utilized: None     Chair to chair: Modified independence  Assistive device utilized: None       RAMP:  Not tested  CURB:  Not tested  STAIRS: STAIRS:  Level of Assistance: CGA  Stair Negotiation Technique: Alternating Pattern  with Bilateral Rails  Number of Stairs: 4   Height of Stairs: 6  Comments: WFL   GAIT: Findings:  Gait pattern: decreased hip/knee flexion- Left and genu recurvatum- Left Distance walked: various clinic distances Assistive device utilized: Quad cane small base Level of assistance: Modified independence   FUNCTIONAL TESTS:          VITALS: Vitals:  09/22/24 1028  BP: 109/67  Pulse: 63                                                                                                                             TREATMENT:  Self-care/home management/Ther Act   Assessed vitals in LUE (see above) and WNL  SciFit multi-peaks level 10.0 for 8 minutes using BUE/BLEs for neural priming for reciprocal movement, dynamic cardiovascular warmup and increased amplitude of stepping.  10# goblet squats w/single foot elevated on 4 step, 2x10 reps per side for improved quad/glute strength and single leg stability.  Pt most challenged w/LLE on step and demonstrated lateral weight shift to L when LLE on step. However, when RLE on step, pt able to maintain midline position. Pt reported only feeling her LLE work w/activity and informed pt this is likely due to her LLE being weaker than R.  Single leg glute bridges w/8# DB, x10 reps per side for improved posterior chain strength and single leg stability. Mod multimodal cues for proper positioning of non-working leg (keep knee bent and foot off mat) which pt able to perform well.    PATIENT EDUCATION: Education details: Continue HEP, add weight to single leg bridge as able  Person educated: Patient Education method: Explanation, Demonstration, Tactile cues, and Verbal cues Education comprehension: verbalized understanding, returned demonstration, verbal cues required, tactile cues required, and needs further education  HOME EXERCISE PROGRAM: Access Code: [email or web address] URL: [email or web address] Date: 08/16/2024 Prepared by: Marlon Stephanine Reas  Exercises - Side Stepping with Resistance at Ankles and Counter Support  - 1 x daily - 7 x weekly - 3 sets - 10 reps - Backward Monster Walk with Resistance at Ankles and Counter Support  - 1 x daily - 7 x weekly - 3 sets - 10 reps - Side Plank with Clam  - 1 x daily - 7 x weekly - 3 sets - 5-8 reps - Bear Hold   - 1 x daily - 7 x weekly - 3-5 sets - 10-15 second hold - Supine Bridge  - 1 x daily - 7 x weekly - 3 sets - 10 reps - 5 sec hold - Single Leg Bridge  - 1 x daily - 7 x weekly - 3 sets - 10 reps - 5 sec hold - Supine 90/90 Alternating Toe Touch  - 1 x daily - 7 x  weekly - 3 sets - 10 reps  -instructed pt to add weight to single leg bridge as able  GOALS: Goals reviewed with patient? No  SHORT TERM GOALS: Target date: 09/01/2024   Pt will be independent with initial HEP for improved strength, balance, transfers and gait. Baseline: Goal status: MET  2.  Pt will improve gait velocity to at least 2.5 ft/sec for improved gait efficiency and performance at mod I level  Baseline: 2.29 ft/sec mod I with SBQC (9/24); 2.72 ft/s w/SBQC (10/15) Goal status: MET  3.  Pt will improve 5 x  STS to less than or equal to 16 seconds to demonstrate improved functional strength and transfer efficiency.  Baseline: 19.8 sec no UE (9/24); 14.12s no UE (10/15) Goal status: MET  4.  Pt will improve FGA to 22/30 for decreased fall risk  Baseline: 18/30 (9/24); 18/30 (11/3) Goal status: NOT MET     LONG TERM GOALS: Target date: 09/22/2024   Pt will be independent with final HEP for improved strength, balance, transfers and gait. Baseline:  Goal status: INITIAL  2.  Pt will improve gait velocity to at least 3.0 ft/sec for improved gait efficiency and performance at mod I level  Baseline: 2.29 ft/sec mod I with SBQC (9/24); 2.72 ft/s w/SBQC (10/15) Goal status: REVISED   3.  Pt will improve 5 x STS to less than or equal to 13 seconds to demonstrate improved functional strength and transfer efficiency.  Baseline: 19.8 sec no UE (9/24); 14.12s no UE (10/15) Goal status: INITIAL  4.  Pt will improve FGA to 26/30 for decreased fall risk  Baseline: 18/30 (9/24) Goal status: INITIAL  5.  Pt will trial gait with no AD to determine if she would be safe to progress away from her [facility] Baseline:  Goal status: INITIAL    ASSESSMENT:  CLINICAL IMPRESSION:  Emphasis of skilled PT session on monitoring vitals, functional BLE strength and single leg stability. Pt presented to session w/eyes dilated and drove herself to session. Pt requesting to work on standing  balance, but due to impaired vision and safety concerns, focused on functional strength training today. Pt states she did not mention her double vision to ophthalmologist today and is being referred to neurophthalmology. Pt on track to DC next session as she has reached her max potential in PT until more medically stable. Continue POC.   OBJECTIVE IMPAIRMENTS: Abnormal gait, decreased balance, decreased cognition, decreased coordination, decreased mobility, difficulty walking, decreased strength, impaired perceived functional ability, impaired sensation, postural dysfunction, and pain.   ACTIVITY LIMITATIONS: carrying, lifting, bending, squatting, and stairs  PARTICIPATION LIMITATIONS: driving and community activity  PERSONAL FACTORS: Time since onset of injury/illness/exacerbation and 3+ comorbidities:   HTN, seizures, migraines, anxiety, depression, chronic pain syndrome, fibromyalgia, GERD, hypothyroidism, peptic ulcer disease, G6PD deficiency, multinodular goiter, polyneuropathy, secondary adrenal insufficiency, multiple allergies, recurrent episodes of anaphylaxis, idiopathic urticaria are also affecting patient's functional outcome.   REHAB POTENTIAL: Good  CLINICAL DECISION MAKING: Evolving/moderate complexity  EVALUATION COMPLEXITY: Moderate  PLAN:  PT FREQUENCY: 2x/week  PT DURATION: 6 weeks  PLANNED INTERVENTIONS: [id number]- PT Re-evaluation, 97750- Physical Performance Testing, 97110-Therapeutic exercises, 97530- Therapeutic activity, [id number]- Neuromuscular re-education, 97535- Self Care, [id number]- Manual therapy, [id number]- Gait training, [phone]- Orthotic Initial, [phone]- Orthotic/Prosthetic subsequent, [phone]- Aquatic Therapy, [phone]- Electrical stimulation (manual), [phone] (1-2 muscles), [id number] (3+ muscles)- Dry Needling, Patient/Family education, Balance training, Stair training, Taping, Joint mobilization, Spinal manipulation, Vestibular training, Cognitive remediation, DME instructions,  Cryotherapy, and Moist heat  PLAN FOR NEXT SESSION: Goals and DC. continue education about not driving following seizures, trial gait with no AD, work on LLE NMR and strengthening (hip and knee flexion, address genu recurvatum), what HEP does she have from [facility]?, estim or [facility]? Work on stability without UE support, ankle/hip strategy. Dual tasking   Pt with more central dizziness signs as well as double vision, would want pt to see ophthalmologist first regarding double vision and then can add in vestibular sessions after with me if needed? - CG   Kwamaine Cuppett E Jaleya Pebley, PT, DPT  09/22/2024, 11:03  AM

## 2024-09-27 ENCOUNTER — Ambulatory Visit: Admitting: Physical Therapy

## 2024-09-27 VITALS — BP 108/77 | HR 81

## 2024-09-27 DIAGNOSIS — M6281 Muscle weakness (generalized): Secondary | ICD-10-CM

## 2024-09-27 DIAGNOSIS — R2681 Unsteadiness on feet: Secondary | ICD-10-CM

## 2024-09-27 DIAGNOSIS — R2689 Other abnormalities of gait and mobility: Secondary | ICD-10-CM

## 2024-09-27 NOTE — Therapy (Signed)
 OUTPATIENT PHYSICAL THERAPY NEURO TREATMENT - DISCHARGE SUMMARY    Patient Name: Kelly Haas MRN: [id number] DOB:25-Jul-1971, 53 y.o., female Today's Date: 09/27/2024   PCP: Jama Chow, MD REFERRING PROVIDER: Jama Chow, MD  PHYSICAL THERAPY DISCHARGE SUMMARY  Visits from Start of Care: 12  Current functional level related to goals / functional outcomes: Pt is mod I w/use of SBQC    Remaining deficits: L hemibody weakness, decreased safety awareness, high fall risk, seizures     Education / Equipment: HEP, continued education on safety concerns w/driving due to recent seizure.  Informed pt she may return to PT in about 3 months once more medically stable and safer to drive.   Patient agrees to discharge. Patient goals were met. Patient is being discharged due to safety concerns and medical instability .Pt continues to drive to therapy despite recent seizure.     END OF SESSION:  PT End of Session - 09/27/24 1104     Visit Number 12    Number of Visits 13   with eval   Date for Recertification  10/06/24   to allow for scheduling delays   Authorization Type Medicare    PT Start Time 1102    PT Stop Time 1144    PT Time Calculation (min) 42 min    Equipment Utilized During Treatment --    Activity Tolerance Patient tolerated treatment well    Behavior During Therapy Flat affect;Lability               Past Medical History:  Diagnosis Date   Abnormal cortisol level 05/01/2023   Abnormal thyroid  function test 05/01/2023   Anxiety disorder    Atypical squamous cell changes of undetermined significance (ASCUS) on vaginal cytology with positive high risk human papilloma virus (HPV) 09/11/2020   Formatting of this note might be different from the original. Many years ago before hyst cryo for abnormal pap 2021 ASCUS pap with pos HR HPV. Colpo-possible lesion- appears excoriated at the R cuff. Brushing shows inflammation with slightly positive  Ki67 index.10 days of  metrogel. Recheck 3 weeks. Repeat pap in 6 mos. 03/2021 Nl pap with pos HR HPV   Bursitis, trochanteric 04/15/2013   Cardiac murmur 04/19/2021   Common migraine with intractable migraine 06/05/2021   Contracture of tendon sheath 11/26/2018   Depression with anxiety    Drug therapy 02/02/2018   Epistaxis 03/13/2016   Essential hypertension 04/19/2021   Fibromyalgia    G-6-PD deficiency 03/13/2016   GERD (gastroesophageal reflux disease)    History of adrenal insufficiency 07/14/2023   History of underactive thyroid  08/27/2022   Hyperemesis gravidarum 03/13/2016   Hyperlipidemia    Hypertension    Insomnia    Kidney stone    Laryngeal trauma 07/23/2012   Long term current use of opiate analgesic 03/12/2013   Migraine    Multiple allergies 07/14/2023   Multiple joint pain 03/13/2016   Multiple myeloma (HCC)    Nontoxic multinodular goiter 08/27/2022   Obesity (BMI 35.0-39.9 without comorbidity) 04/19/2021   Onychomycosis due to dermatophyte 11/03/2018   Osteoarthritis of left hip 03/12/2013   Other reduced mobility 07/15/2023   Ovarian cyst 03/13/2016   Pain due to onychomycosis of toenail of left foot 02/19/2018   Pain syndrome, chronic 03/12/2013   Peripheral polyneuropathy 02/02/2018   Pharyngitis 03/13/2016   Positive ANA (antinuclear antibody) 03/13/2016   Renal insufficiency    Sacroiliac pain 03/12/2013   Secondary adrenal insufficiency 05/01/2023   Seizure disorder (HCC) 11/13/2017  Syncope and collapse 01/01/2016   Thoracic spondylosis 03/12/2013   Thyroid  disease    Urticaria    Vaginal bleeding 05/20/2019   Formatting of this note might be different from the original.  Prior hyst ~2005 for benign indications Bleeding 05/2019 Postcoital bleeding reported 08/2020   Vitamin D  insufficiency 05/01/2023   Voice hoarseness 02/02/2018   Past Surgical History:  Procedure Laterality Date   ABDOMINAL HYSTERECTOMY     APPENDECTOMY     CHOLECYSTECTOMY      ESOPHAGOGASTRODUODENOSCOPY  08/29/2017   Mild gastritis. Stauts post empiric esophageal dilitation.    TOE SURGERY Left    removed toe nail   TONSILLECTOMY AND ADENOIDECTOMY     Patient Active Problem List   Diagnosis Date Noted   Urticaria    Other reduced mobility 07/15/2023   History of adrenal insufficiency 07/14/2023   Multiple allergies 07/14/2023   Abnormal cortisol level 05/01/2023   Abnormal thyroid  function test 05/01/2023   Secondary adrenal insufficiency 05/01/2023   Vitamin D  insufficiency 05/01/2023   History of underactive thyroid  08/27/2022   Nontoxic multinodular goiter 08/27/2022   Anxiety disorder 06/26/2021   GERD (gastroesophageal reflux disease) 06/26/2021   Hyperlipidemia 06/26/2021   Hypertension 06/26/2021   Common migraine with intractable migraine 06/05/2021   Depression with anxiety 06/01/2021   Fibromyalgia 06/01/2021   Kidney stone 06/01/2021   Migraine 06/01/2021   Thyroid  disease 06/01/2021   Essential hypertension 04/19/2021   Cardiac murmur 04/19/2021   Obesity (BMI 35.0-39.9 without comorbidity) 04/19/2021   Atypical squamous cell changes of undetermined significance (ASCUS) on vaginal cytology with positive high risk human papilloma virus (HPV) 09/11/2020   Vaginal bleeding 05/20/2019   Contracture of tendon sheath 11/26/2018   Onychomycosis due to dermatophyte 11/03/2018   Pain due to onychomycosis of toenail of left foot 02/19/2018   Drug therapy 02/02/2018   Peripheral polyneuropathy 02/02/2018   Voice hoarseness 02/02/2018   Seizure disorder (HCC) 11/13/2017   Epistaxis 03/13/2016   G-6-PD deficiency 03/13/2016   Hyperemesis gravidarum 03/13/2016   Insomnia 03/13/2016   Multiple joint pain 03/13/2016   Ovarian cyst 03/13/2016   Pharyngitis 03/13/2016   Positive ANA (antinuclear antibody) 03/13/2016   Syncope and collapse 01/01/2016   Bursitis, trochanteric 04/15/2013   Long term current use of opiate analgesic 03/12/2013    Osteoarthritis of left hip 03/12/2013   Pain syndrome, chronic 03/12/2013   Sacroiliac pain 03/12/2013   Thoracic spondylosis 03/12/2013   Laryngeal trauma 07/23/2012    ONSET DATE: 07/16/2024 (referral)   REFERRING DIAG: R53.1 (ICD-10-CM) - Weakness  THERAPY DIAG:  Muscle weakness (generalized)  Other abnormalities of gait and mobility  Unsteadiness on feet  Rationale for Evaluation and Treatment: Rehabilitation  SUBJECTIVE:  SUBJECTIVE STATEMENT: [name] w/SBQC. States she had a headache all weekend. Has questions regarding her HEP. Am I supposed to do all that s**t??.   Denies falls or pain today. Ate a mini croissant prior to session. When asked what she wants to work on today, pt reported whatever you want.   Sees neurophthalmology on 11/17 at 1:30.   Pt accompanied by: self, drove herself today   PERTINENT HISTORY: PMH: HTN, seizures, migraines, anxiety, depression, chronic pain syndrome, fibromyalgia, GERD, hypothyroidism, peptic ulcer disease, G6PD deficiency, multinodular goiter, polyneuropathy, secondary adrenal insufficiency, multiple allergies, recurrent episodes of anaphylaxis, idiopathic urticaria   PAIN:  Are you having pain? No  PRECAUTIONS: Fall  RED FLAGS: None   WEIGHT BEARING RESTRICTIONS: No  FALLS: Has patient fallen in last 6 months? Yes. Number of falls frequently, unable to give further information   SEIZURES: Most recent one was 3 weeks ago, had a headache while driving and called her daughter who came to get her and she pulled over Lack of sleep and stress throw everything off  LIVING ENVIRONMENT: Lives with: lives alone Lives in: House/apartment Stairs: Yes: External: 2 steps; none; steps at back door, level entry at front door Has  following equipment at home: Quad cane small base, Environmental Consultant - 4 wheeled, Wheelchair (manual), shower chair, and bed side commode - BSC sits over her toilet  PLOF: Independent with basic ADLs, Independent with gait, Independent with transfers, and Requires assistive device for independence  PATIENT GOALS: to get off my convertible [facility]), I want to run-I know I can't run, run, I want this leg (left) to be able to hop, skip, and jump  OBJECTIVE:  Note: Objective measures were completed at Evaluation unless otherwise noted.  DIAGNOSTIC FINDINGS: None updated or relevant to this POC  COGNITION: Overall cognitive status: her family says she will repeat stuff, possible short term memory impairments   SENSATION: Gets numbness in LLE at times, no tingling Decreased light touch sensation in LLE, tingling in distal LLE  COORDINATION: Impaired LLE  POSTURE: forward head and hunched shoulders  LOWER EXTREMITY ROM:   WFL  Active  Right Eval Left Eval  Hip flexion    Hip extension    Hip abduction    Hip adduction    Hip internal rotation    Hip external rotation    Knee flexion    Knee extension    Ankle dorsiflexion    Ankle plantarflexion    Ankle inversion    Ankle eversion     (Blank rows = not tested)  LOWER EXTREMITY MMT:    MMT Right Eval Left Eval  Hip flexion 5 4+  Hip extension    Hip abduction    Hip adduction    Hip internal rotation    Hip external rotation    Knee flexion 5 4  Knee extension 5 4  Ankle dorsiflexion 5 4  Ankle plantarflexion    Ankle inversion    Ankle eversion    (Blank rows = not tested)  BED MOBILITY:  Mod I per pt report  TRANSFERS: Sit to stand: Modified independence  Assistive device utilized: None     Stand to sit: Modified independence  Assistive device utilized: None     Chair to chair: Modified independence  Assistive device utilized: None       RAMP:  Not tested  CURB:  Not tested  STAIRS: STAIRS:  Level of  Assistance: CGA  Stair Negotiation Technique: Alternating Pattern  with Bilateral Rails  Number of Stairs: 4   Height of Stairs: 6  Comments: WFL   GAIT: Findings:  Gait pattern: decreased hip/knee flexion- Left and genu recurvatum- Left Distance walked: various clinic distances Assistive device utilized: Quad cane small base Level of assistance: Modified independence   FUNCTIONAL TESTS:    [facility] PT Assessment - 09/27/24 1115       Transfers   Five time sit to stand comments  11.96s   no UE support     Ambulation/Gait   Gait velocity 32.8' over 14.21s = 2.31 ft/s w/SBQC               VITALS: Vitals:   09/27/24 1107  BP: 108/77  Pulse: 81                                                                                                                              TREATMENT:  Self-care/home management/Ther Act  Assessed vitals in LUE (see above) and WNL   LTG Assessment   [facility] PT Assessment - 09/27/24 1115       Transfers   Five time sit to stand comments  11.96s   no UE support     Ambulation/Gait   Gait velocity 32.8' over 14.21s = 2.31 ft/s w/SBQC         Verbally reviewed HEP and educated pt on the frequency to perform at home. Recommended pt choose 2-3 exercises to do daily and mix and match them. Pt reports the exercises wear me out, so informed pt to work on them 3-5 days a week or 150 minutes per week.  Educated pt on myasthenia gravis, but pt not receptive to education today.  Pt emotional when reminded that she is being DC today, despite informing pt of this several sessions ago. Pt initially reporting she was not aware of DC and is being kicked out, but later stating she knew of DC and understands why. Pt tearful for several minutes and was reminded of why a break from PT is warranted. Pt verbalized understanding.  Educated pt to return to PT in around 3-4 months (as pt should not be driving for at least 4 more months). Pt again emotional  about this but verbalized understanding. Informed pt of how to obtain new PT referral in future.     PATIENT EDUCATION: Education details: Continue HEP, goal results, importance of medical management prior to resuming PT  Person educated: Patient Education method: Explanation Education comprehension: verbalized understanding and needs further education  HOME EXERCISE PROGRAM: Access Code: [email or web address] URL: [email or web address] Date: 08/16/2024 Prepared by: Marlon Kayleann Mccaffery  Exercises - Side Stepping with Resistance at Ankles and Counter Support  - 1 x daily - 7 x weekly - 3 sets - 10 reps - Backward Monster Walk with Resistance at Ankles and Counter Support  - 1 x daily - 7 x weekly - 3 sets - 10 reps - Side Plank with Clam  -  1 x daily - 7 x weekly - 3 sets - 5-8 reps - Bear Hold   - 1 x daily - 7 x weekly - 3-5 sets - 10-15 second hold - Supine Bridge  - 1 x daily - 7 x weekly - 3 sets - 10 reps - 5 sec hold - Single Leg Bridge  - 1 x daily - 7 x weekly - 3 sets - 10 reps - 5 sec hold - Supine 90/90 Alternating Toe Touch  - 1 x daily - 7 x weekly - 3 sets - 10 reps  -instructed pt to add weight to single leg bridge as able  GOALS: Goals reviewed with patient? No  SHORT TERM GOALS: Target date: 09/01/2024   Pt will be independent with initial HEP for improved strength, balance, transfers and gait. Baseline: Goal status: MET  2.  Pt will improve gait velocity to at least 2.5 ft/sec for improved gait efficiency and performance at mod I level  Baseline: 2.29 ft/sec mod I with SBQC (9/24); 2.72 ft/s w/SBQC (10/15) Goal status: MET  3.  Pt will improve 5 x STS to less than or equal to 16 seconds to demonstrate improved functional strength and transfer efficiency.  Baseline: 19.8 sec no UE (9/24); 14.12s no UE (10/15) Goal status: MET  4.  Pt will improve FGA to 22/30 for decreased fall risk  Baseline: 18/30 (9/24); 18/30 (11/3) Goal status: NOT MET     LONG  TERM GOALS: Target date: 09/22/2024   Pt will be independent with final HEP for improved strength, balance, transfers and gait. Baseline:  Goal status: MET  2.  Pt will improve gait velocity to at least 3.0 ft/sec for improved gait efficiency and performance at mod I level  Baseline: 2.29 ft/sec mod I with SBQC (9/24); 2.72 ft/s w/SBQC (10/15) Goal status: REVISED   3.  Pt will improve 5 x STS to less than or equal to 13 seconds to demonstrate improved functional strength and transfer efficiency.  Baseline: 19.8 sec no UE (9/24); 14.12s no UE (10/15); 11.96s no UE support (11/10) Goal status: MET  4.  Pt will improve FGA to 26/30 for decreased fall risk  Baseline: 18/30 (9/24); 18/30 (11/3) Goal status: NOT MET  5.  Pt will trial gait with no AD to determine if she would be safe to progress away from her [facility] Baseline: not safe on 11/10  Goal status: NOT MET    ASSESSMENT:  CLINICAL IMPRESSION:  Emphasis of skilled PT session on LTG assessment, pt education and DC from PT. Pt has met 2 of 5 LTGs, reporting independence w/HEP and improving 5x STS to goal level without UE support. Pt did not improve her score on FGA from eval, largely limited due to vision impairments, lack of proper nutrition and recent seizure. Pt did not improve her gait speed and is not safe to trial gait without AD at this time due to medical instability. Pt aware that she is not safe to drive but is not receptive to therapist education on this. Pt being referred to neurophthalmology for ptosis and suspected myasthenia gravis as well as reports of inconsistent double vision. Pt overall is not making progress in PT due to medical instability and is not safe to be driving due to recent seizure. Pt encouraged to work on her HEP at home, as this is challenging for her, and then return to PT once more medically stable. Pt continues to require [facility] for safety w/ambulation and is  encouraged to look into medical transportation  for safety.   OBJECTIVE IMPAIRMENTS: Abnormal gait, decreased balance, decreased cognition, decreased coordination, decreased mobility, difficulty walking, decreased strength, impaired perceived functional ability, impaired sensation, postural dysfunction, and pain.   ACTIVITY LIMITATIONS: carrying, lifting, bending, squatting, and stairs  PARTICIPATION LIMITATIONS: driving and community activity  PERSONAL FACTORS: Time since onset of injury/illness/exacerbation and 3+ comorbidities:   HTN, seizures, migraines, anxiety, depression, chronic pain syndrome, fibromyalgia, GERD, hypothyroidism, peptic ulcer disease, G6PD deficiency, multinodular goiter, polyneuropathy, secondary adrenal insufficiency, multiple allergies, recurrent episodes of anaphylaxis, idiopathic urticaria are also affecting patient's functional outcome.   REHAB POTENTIAL: Good  CLINICAL DECISION MAKING: Evolving/moderate complexity  EVALUATION COMPLEXITY: Moderate  PLAN:  PT FREQUENCY: 2x/week  PT DURATION: 6 weeks  PLANNED INTERVENTIONS: 97164- PT Re-evaluation, 97750- Physical Performance Testing, 97110-Therapeutic exercises, 97530- Therapeutic activity, 97112- Neuromuscular re-education, 97535- Self Care, [id number]- Manual therapy, [phone]- Gait training, [phone]- Orthotic Initial, [phone]- Orthotic/Prosthetic subsequent, [phone]- Aquatic Therapy, [phone]- Electrical stimulation (manual), [phone] (1-2 muscles), [id number] (3+ muscles)- Dry Needling, Patient/Family education, Balance training, Stair training, Taping, Joint mobilization, Spinal manipulation, Vestibular training, Cognitive remediation, DME instructions, Cryotherapy, and Moist heat     Lathon Adan E Aaima Gaddie, PT, DPT 09/27/2024, 11:51 AM

## 2024-10-04 ENCOUNTER — Encounter: Payer: Self-pay | Admitting: Gastroenterology

## 2024-10-04 ENCOUNTER — Ambulatory Visit (AMBULATORY_SURGERY_CENTER): Admitting: Gastroenterology

## 2024-10-04 VITALS — BP 117/67 | HR 62 | Temp 97.4°F | Resp 19 | Ht 65.0 in | Wt 228.0 lb

## 2024-10-04 DIAGNOSIS — K640 First degree hemorrhoids: Secondary | ICD-10-CM | POA: Diagnosis not present

## 2024-10-04 DIAGNOSIS — Z1211 Encounter for screening for malignant neoplasm of colon: Secondary | ICD-10-CM | POA: Diagnosis not present

## 2024-10-04 DIAGNOSIS — R1084 Generalized abdominal pain: Secondary | ICD-10-CM

## 2024-10-04 MED ORDER — SODIUM CHLORIDE 0.9 % IV SOLN: 500.0000 mL | Freq: Once | INTRAVENOUS | Status: DC

## 2024-10-04 NOTE — Patient Instructions (Signed)
 Resume previous diet and medications. Miralax 1 capful (17 grams) in 8 ounces of water by mouth daily.  Repeat Colonoscopy in 10 years for screening purposes. Earlier with any new problems.  YOU HAD AN ENDOSCOPIC PROCEDURE TODAY AT THE [facility]:   Refer to the procedure report that was given to you for any specific questions about what was found during the examination.  If the procedure report does not answer your questions, please call your gastroenterologist to clarify.  If you requested that your care partner not be given the details of your procedure findings, then the procedure report has been included in a sealed envelope for you to review at your convenience later.  YOU SHOULD EXPECT: Some feelings of bloating in the abdomen. Passage of more gas than usual.  Walking can help get rid of the air that was put into your GI tract during the procedure and reduce the bloating. If you had a lower endoscopy (such as a colonoscopy or flexible sigmoidoscopy) you may notice spotting of blood in your stool or on the toilet paper. If you underwent a bowel prep for your procedure, you may not have a normal bowel movement for a few days.  Please Note:  You might notice some irritation and congestion in your nose or some drainage.  This is from the oxygen  used during your procedure.  There is no need for concern and it should clear up in a day or so.  SYMPTOMS TO REPORT IMMEDIATELY:  Following lower endoscopy (colonoscopy or flexible sigmoidoscopy):  Excessive amounts of blood in the stool  Significant tenderness or worsening of abdominal pains  Swelling of the abdomen that is new, acute  Fever of 100F or higher  For urgent or emergent issues, a gastroenterologist can be reached at any hour by calling [phone]. Do not use MyChart messaging for urgent concerns.    DIET:  We do recommend a small meal at first, but then you may proceed to your regular diet.  Drink plenty of fluids but  you should avoid alcoholic beverages for 24 hours.  ACTIVITY:  You should plan to take it easy for the rest of today and you should NOT DRIVE or use heavy machinery until tomorrow (because of the sedation medicines used during the test).    FOLLOW UP: Our staff will call the number listed on your records the next business day following your procedure.  We will call around 7:15- 8:00 am to check on you and address any questions or concerns that you may have regarding the information given to you following your procedure. If we do not reach you, we will leave a message.     If any biopsies were taken you will be contacted by phone or by letter within the next 1-3 weeks.  Please call us  at [phone] if you have not heard about the biopsies in 3 weeks.    SIGNATURES/CONFIDENTIALITY: You and/or your care partner have signed paperwork which will be entered into your electronic medical record.  These signatures attest to the fact that that the information above on your After Visit Summary has been reviewed and is understood.  Full responsibility of the confidentiality of this discharge information lies with you and/or your care-partner.

## 2024-10-04 NOTE — Progress Notes (Signed)
 Pt's states no medical or surgical changes since previsit or office visit.

## 2024-10-04 NOTE — Progress Notes (Signed)
 Chief Complaint: Abdominal pain  Referring Provider:  Jama Chow, MD      ASSESSMENT AND PLAN;   #1. GERD with epi pain- neg EGD 10/2021. S/p lap chole in the past. Neg solid-phase GES 08/2016, neg CT abdo/pel 05/2018, neg CTA 03/12/2019.  Likely functional dyspepsia/abdominal wall pain.   #2.  IBS-C, neg colon 07/04/2017 except for small posterior anal fissure. Now with occ diarrhea.  #3. Mild gastroparesis (No DM).   Plan: - Protonix  40mg  po every day - Linzess  72 mcg po every day (samples) - Increase water intake. - Bentyl  10mg  po qid  prn. #120, 6 refills.  - Continue Zofran  prn as prescribed by Dr. Jama - FU 6 months   HPI:    Kelly Haas is a 53 y.o. female  With history of migraine headaches, seizure disorder, G6PD deficiency, multinodular goiter, polyneuropathy, secondary adrenal insufficiency, multiple allergies, recurrent episodes of anaphylaxis, idiopathic urticaria   FU kidney stones  For follow-up visit [facility] Aug 2024  Nausea even when she smells something. Having N/V, somewhat better with zofran . Postprandial epi pain. Having dysphagia now with both solids and liquids. No heartburn. No particular food intolerence. No NSAIDs.  No constipation. Has been using 2 Gummies per day.  Denies having any significant constipation.  Having BMs 1/every other day.  No melena or hematochezia.  Negative CTA Abdo/pelvis 03/12/2019  Would like to have medication refill-Bentyl .  Wt Readings from Last 3 Encounters:  10/04/24 228 lb (103.4 kg)  08/26/24 228 lb 4 oz (103.5 kg)  06/28/24 230 lb (104.3 kg)   Stress eater.  Has gained weight   Past GI WU: EGD 10/25/2021 - Mild Gastritis.  - S/ P empiric esophageal dilatation 52Fr  Colon: Neg colon 07/04/2017 except for small posterior anal fissure. Rpt 10 yrs  NCCT 09/13/2023 There is a 3 mm ureterolithiasis at the distal LEFT ureter in the region of the LEFT UVJ with mild upstream LEFT hydroureteronephrosis.   GES  04/25/2023 15.1% emptied at 1 hr ( normal >= 10%) 39.1% emptied at 2 hr ( normal >= 40%) 49.3% emptied at 3 hr ( normal >= 70%) 80.3% emptied at 4 hr ( normal >= 90%) IMPRESSION: Delayed gastric emptying study.  Past Medical History:  Diagnosis Date   Abnormal cortisol level 05/01/2023   Abnormal thyroid  function test 05/01/2023   Anxiety disorder    Atypical squamous cell changes of undetermined significance (ASCUS) on vaginal cytology with positive high risk human papilloma virus (HPV) 09/11/2020   Formatting of this note might be different from the original. Many years ago before hyst cryo for abnormal pap 2021 ASCUS pap with pos HR HPV. Colpo-possible lesion- appears excoriated at the R cuff. Brushing shows inflammation with slightly positive  Ki67 index.10 days of metrogel. Recheck 3 weeks. Repeat pap in 6 mos. 03/2021 Nl pap with pos HR HPV   Bursitis, trochanteric 04/15/2013   Cardiac murmur 04/19/2021   Common migraine with intractable migraine 06/05/2021   Contracture of tendon sheath 11/26/2018   Depression with anxiety    Drug therapy 02/02/2018   Epistaxis 03/13/2016   Essential hypertension 04/19/2021   Fibromyalgia    G-6-PD deficiency 03/13/2016   GERD (gastroesophageal reflux disease)    History of adrenal insufficiency 07/14/2023   History of underactive thyroid  08/27/2022   Hyperemesis gravidarum 03/13/2016   Hyperlipidemia    Hypertension    Insomnia    Kidney stone    Laryngeal trauma 07/23/2012   Long term current  use of opiate analgesic 03/12/2013   Migraine    Multiple allergies 07/14/2023   Multiple joint pain 03/13/2016   Multiple myeloma (HCC)    Nontoxic multinodular goiter 08/27/2022   Obesity (BMI 35.0-39.9 without comorbidity) 04/19/2021   Onychomycosis due to dermatophyte 11/03/2018   Osteoarthritis of left hip 03/12/2013   Other reduced mobility 07/15/2023   Ovarian cyst 03/13/2016   Pain due to onychomycosis of toenail of left foot 02/19/2018    Pain syndrome, chronic 03/12/2013   Peripheral polyneuropathy 02/02/2018   Pharyngitis 03/13/2016   Positive ANA (antinuclear antibody) 03/13/2016   Renal insufficiency    Sacroiliac pain 03/12/2013   Secondary adrenal insufficiency 05/01/2023   Seizure disorder (HCC) 11/13/2017   Syncope and collapse 01/01/2016   Thoracic spondylosis 03/12/2013   Thyroid  disease    Urticaria    Vaginal bleeding 05/20/2019   Formatting of this note might be different from the original.  Prior hyst ~2005 for benign indications Bleeding 05/2019 Postcoital bleeding reported 08/2020   Vitamin D  insufficiency 05/01/2023   Voice hoarseness 02/02/2018    Past Surgical History:  Procedure Laterality Date   ABDOMINAL HYSTERECTOMY     APPENDECTOMY     CHOLECYSTECTOMY     ESOPHAGOGASTRODUODENOSCOPY  08/29/2017   Mild gastritis. Stauts post empiric esophageal dilitation.    TOE SURGERY Left    removed toe nail   TONSILLECTOMY AND ADENOIDECTOMY      Family History  Problem Relation Age of Onset   Cancer Mother    Diabetes Mother    Hypertension Mother    Kidney cancer Mother    Heart attack Father        57   Seizures Father    Thyroid  cancer Sister        Patient reported   Thyroid  cancer Maternal Aunt    Hypertension Maternal Grandmother    Lung cancer Maternal Grandmother    Stroke Maternal Grandmother    Heart attack Maternal Grandmother    Hypertension Maternal Grandfather    Colon cancer Maternal Grandfather    Pancreatic cancer Maternal Grandfather    Prostate cancer Maternal Grandfather    Lung cancer Maternal Grandfather    Hypertension Paternal Grandmother    Hypertension Paternal Grandfather    Esophageal cancer Neg Hx    Stomach cancer Neg Hx    Rectal cancer Neg Hx     Social History   Tobacco Use   Smoking status: Never   Smokeless tobacco: Never  Vaping Use   Vaping status: Never Used  Substance Use Topics   Alcohol use: No   Drug use: No    Current Outpatient  Medications  Medication Sig Dispense Refill   albuterol (VENTOLIN HFA) 108 (90 Base) MCG/ACT inhaler Inhale 2 puffs into the lungs every 4 (four) hours as needed.     busPIRone (BUSPAR) 30 MG tablet Take 30 mg by mouth 2 (two) times daily.     clonazePAM (KLONOPIN) 0.5 MG tablet Take 0.5 mg by mouth 2 (two) times daily as needed for anxiety.     cyclobenzaprine (FLEXERIL) 10 MG tablet Take 1 tablet (10 mg total) by mouth 3 (three) times daily as needed for Muscle spasms.  3   dicyclomine  (BENTYL ) 20 MG tablet Take 1 tablet (20 mg total) by mouth 4 (four) times daily -  before meals and at bedtime. 360 tablet 2   FLUoxetine (PROZAC) 20 MG capsule Take 20 mg by mouth daily.     fluticasone (FLONASE) 50 MCG/ACT  nasal spray Place 2 sprays into both nostrils daily.     LamoTRIgine  200 MG TB24 24 hour tablet TAKE 2 TABLETS (400 MG TOTAL) BY MOUTH DAILY. 180 tablet 3   lubiprostone (AMITIZA) 8 MCG capsule Take 1 capsule (8 mcg total) by mouth 2 (two) times daily with a meal. 180 capsule 0   meclizine (ANTIVERT) 25 MG tablet Take 25 mg by mouth as needed for dizziness or nausea.     ondansetron  (ZOFRAN -ODT) 8 MG disintegrating tablet Take 1 tablet (8 mg total) by mouth every 8 (eight) hours as needed for nausea or vomiting. 20 tablet 0   pantoprazole  (PROTONIX ) 40 MG tablet TAKE 1 TABLET EVERY DAY 90 tablet 3   pregabalin (LYRICA) 75 MG capsule Take 75 mg by mouth 2 (two) times daily.     traZODone (DESYREL) 100 MG tablet Take 200 mg by mouth at bedtime as needed.     amLODipine (NORVASC) 5 MG tablet Take 5 mg by mouth daily. (Patient not taking: Reported on 10/04/2024)     EPINEPHrine 0.3 mg/0.3 mL IJ SOAJ injection Inject 0.3 mg into the muscle as needed for anaphylaxis.     hydrOXYzine (ATARAX) 50 MG tablet Take 50 mg by mouth at bedtime.     linaclotide  (LINZESS ) 145 MCG CAPS capsule Take 1 capsule (145 mcg total) by mouth daily before breakfast. 90 capsule 3   loratadine (CLARITIN) 10 MG tablet Take  10 mg by mouth daily.     LYRICA 50 MG capsule Take 1 capsule by mouth 2 (two) times daily.  (Patient not taking: Reported on 10/04/2024)  5   nitroGLYCERIN  (NITROSTAT ) 0.4 MG SL tablet Place 0.4 mg under the tongue every 5 (five) minutes as needed for chest pain.     rizatriptan  (MAXALT ) 10 MG tablet Take 5 mg by mouth as needed.     SUMAtriptan (IMITREX) 100 MG tablet Take 100 mg by mouth every 2 (two) hours as needed for migraine.     tamsulosin (FLOMAX) 0.4 MG CAPS capsule Take 0.4 mg by mouth as needed.     topiramate  (TOPAMAX ) 100 MG tablet Take 1.5 tablets (150 mg total) by mouth at bedtime. 135 tablet 3   traMADol (ULTRAM) 50 MG tablet Take 50 mg by mouth.     XOLAIR 150 MG/ML prefilled syringe Inject 150 mg into the skin every 30 (thirty) days. 2 injections     No current facility-administered medications for this visit.    Allergies  Allergen Reactions   Acetaminophen  Other (See Comments)    Pass out   Bee Venom Anaphylaxis   Iodinated Contrast Media Anaphylaxis   Methylcellulose Other (See Comments)    Pass out    Ibuprofen Hives, Nausea And Vomiting and Rash    Other reaction(s): Vomiting (intolerance)   Aminolevulinate Derivatives Other (See Comments)    unknown   Aspirin Nausea And Vomiting   Aspirin-Acetaminophen -Caffeine Other (See Comments)    unknown   Celebrex [Celecoxib] Hives and Rash   Dapsone Other (See Comments)    unknown   Dimercaprol Other (See Comments)    unknown   Latex Rash   Naproxen Hives    Hives   Nitrofuran Derivatives Other (See Comments)    unknown   Other Rash and Other (See Comments)    Artichoke- rash around lips, ticklish throat   Oxycodone  Rash   Phenazopyridine Other (See Comments)    unknown   Phenergan  [Promethazine  Hcl] Rash   Primaquine Other (See Comments)  unknown   Sulfa Antibiotics Nausea And Vomiting   Toluidine Blue Other (See Comments)    unknown    Review of Systems:  Reviewed     Physical Exam:     Today's Vitals   10/04/24 1507 10/04/24 1508 10/04/24 1518 10/04/24 1524  BP:  109/64 110/64 117/67  Pulse:  71 67 62  Resp: 15 (!) 24 16 19   Temp:      SpO2: 98% 94% 100% 97%  Weight:      Height:       Body mass index is 37.94 kg/m.; Gen: awake, alert, NAD HEENT: anicteric, no pallor CV: RRR, no mrg Pulm: CTA b/l Abd: soft, NT/ND, +BS throughout Ext: no c/c/e Neuro: nonfocal   Data Reviewed: I have personally reviewed following labs and imaging studies  CBC:    Latest Ref Rng & Units 06/28/2024    2:38 PM 02/04/2022    1:04 PM 12/12/2021   10:22 AM  CBC  WBC 4.0 - 10.5 K/uL 6.5  6.7  6.5   Hemoglobin 12.0 - 15.0 g/dL 87.3  86.8  87.6   Hematocrit 36.0 - 46.0 % 38.3  40.3  37.3   Platelets 150.0 - 400.0 K/uL 309.0  335  283     CMP:    Latest Ref Rng & Units 06/28/2024    2:38 PM 02/04/2022    1:04 PM 12/12/2021   10:22 AM  CMP  Glucose 70 - 99 mg/dL 893  895  894   BUN 6 - 23 mg/dL 9  8  5    Creatinine 0.40 - 1.20 mg/dL 9.03  8.94  8.99   Sodium 135 - 145 mEq/L 143  140  140   Potassium 3.5 - 5.1 mEq/L 3.9  4.2  3.7   Chloride 96 - 112 mEq/L 111  106  109   CO2 19 - 32 mEq/L 26  27  24    Calcium 8.4 - 10.5 mg/dL 9.0  9.3  8.5   Total Protein 6.0 - 8.3 g/dL 6.7     Total Bilirubin 0.2 - 1.2 mg/dL 0.4     Alkaline Phos 39 - 117 U/L 81     AST 0 - 37 U/L 10     ALT 0 - 35 U/L 9           Anselm Bring, MD 10/04/2024, 3:57 PM  Cc: Jama Chow, MD

## 2024-10-04 NOTE — Op Note (Signed)
 [facility] Patient Name: Kelly Haas Procedure Date: 10/04/2024 2:28 PM MRN: [id number] Endoscopist: Lynnie Bring , MD, [id number] Age: 53 Referring MD:  Date of Birth: [birth year] Gender: Female Account #: [id number] Procedure:                Colonoscopy Indications:              Screening for colorectal malignant neoplasm Medicines:                Monitored Anesthesia Care Procedure:                Pre-Anesthesia Assessment:                           - Prior to the procedure, a History and Physical                            was performed, and patient medications and                            allergies were reviewed. The patient's tolerance of                            previous anesthesia was also reviewed. The risks                            and benefits of the procedure and the sedation                            options and risks were discussed with the patient.                            All questions were answered, and informed consent                            was obtained. Prior Anticoagulants: The patient has                            taken no anticoagulant or antiplatelet agents. ASA                            Grade Assessment: II - A patient with mild systemic                            disease. After reviewing the risks and benefits,                            the patient was deemed in satisfactory condition to                            undergo the procedure.                           After obtaining informed consent, the colonoscope  was passed under direct vision. Throughout the                            procedure, the patient's blood pressure, pulse, and                            oxygen  saturations were monitored continuously. The                            Olympus Scope [id number] was introduced through the                            anus and advanced to the the cecum, identified by                            appendiceal  orifice and ileocecal valve. The                            colonoscopy was somewhat difficult due to a                            tortuous colon and the patient's body habitus.                            Successful completion of the procedure was aided by                            applying abdominal pressure. The patient tolerated                            the procedure well. The quality of the bowel                            preparation was good. The ileocecal valve,                            appendiceal orifice, and rectum were photographed. Scope In: 2:40:56 PM Scope Out: 3:06:24 PM Scope Withdrawal Time: 0 hours 19 minutes 16 seconds  Total Procedure Duration: 0 hours 25 minutes 28 seconds  Findings:                 The colon (entire examined portion) appeared normal.                           The colon (entire examined portion) was moderately                            tortuous specially right colon including cecum.                            Cecum was mobile.                           Non-bleeding internal hemorrhoids were found during  retroflexion. The hemorrhoids were small and Grade                            I (internal hemorrhoids that do not prolapse).                           The exam was otherwise without abnormality on                            direct and retroflexion views. Complications:            No immediate complications. Estimated Blood Loss:     Estimated blood loss: none. Impression:               - Moderately tortuous colon.                           - Non-bleeding internal hemorrhoids.                           - The examination was otherwise normal on direct                            and retroflexion views.                           - No specimens collected. Recommendation:           - Patient has a contact number available for                            emergencies. The signs and symptoms of potential                             delayed complications were discussed with the                            patient. Return to normal activities tomorrow.                            Written discharge instructions were provided to the                            patient.                           - Resume previous diet.                           - Continue present medications.                           - Miralax 1 capful (17 grams) in 8 ounces of water                            PO daily.                           -  Repeat colonoscopy in 10 years for screening                            purposes. Earlier, with any new problems or change                            in family history.                           - The findings and recommendations were discussed                            with the patient's family. Lynnie Bring, MD 10/04/2024 3:12:05 PM This report has been signed electronically.

## 2024-10-04 NOTE — Progress Notes (Signed)
 Report to PACU, RN, vss, BBS= Clear.

## 2024-10-05 ENCOUNTER — Telehealth: Payer: Self-pay

## 2024-10-05 NOTE — Telephone Encounter (Signed)
  Follow up Call-     10/04/2024    2:03 PM  Call back number  Post procedure Call Back phone  # [phone]  Permission to leave phone message Yes     Patient questions:  Do you have a fever, pain , or abdominal swelling? No. Pain Score  0 *  Have you tolerated food without any problems? Yes.    Have you been able to return to your normal activities? Yes.    Do you have any questions about your discharge instructions: Diet   No. Medications  No. Follow up visit  No.  Do you have questions or concerns about your Care? No.  Actions: * If pain score is 4 or above: No action needed, pain <4.

## 2024-10-07 ENCOUNTER — Encounter: Payer: Self-pay | Admitting: Neurology

## 2024-10-07 ENCOUNTER — Ambulatory Visit: Admitting: Neurology

## 2024-10-07 VITALS — BP 117/71 | HR 78 | Ht 65.0 in | Wt 224.5 lb

## 2024-10-07 DIAGNOSIS — G4700 Insomnia, unspecified: Secondary | ICD-10-CM

## 2024-10-07 DIAGNOSIS — G40909 Epilepsy, unspecified, not intractable, without status epilepticus: Secondary | ICD-10-CM

## 2024-10-07 DIAGNOSIS — F32A Depression, unspecified: Secondary | ICD-10-CM | POA: Diagnosis not present

## 2024-10-07 DIAGNOSIS — G43709 Chronic migraine without aura, not intractable, without status migrainosus: Secondary | ICD-10-CM | POA: Diagnosis not present

## 2024-10-07 DIAGNOSIS — F419 Anxiety disorder, unspecified: Secondary | ICD-10-CM | POA: Diagnosis not present

## 2024-10-07 MED ORDER — RIZATRIPTAN BENZOATE 10 MG PO TABS: 10.0000 mg | ORAL_TABLET | ORAL | 11 refills | Status: AC | PRN
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
## 2024-10-07 NOTE — Patient Instructions (Addendum)
 Continue current medications  Will refill Maxalt  for the headaches  Give patient sample of Nurtec Continue following with your doctors  Referral to sleep neurology to rule out organic causes of insomnia Return in 6 months or sooner if worse

## 2024-10-21 ENCOUNTER — Telehealth: Payer: Self-pay | Admitting: Neurology

## 2024-10-21 NOTE — Telephone Encounter (Signed)
 FYI

## 2024-10-21 NOTE — Telephone Encounter (Signed)
 Patient called to schedule an appointment. Ophthalmologist was to send a referral for th nerve in my eye Informed patient could not find a referral, please call your ophthalmologist and have him to send referral. Patient verbalized understand.

## 2024-10-26 NOTE — Telephone Encounter (Addendum)
 Pt has returned call to CMA, she would like a call back at [phone]

## 2024-10-28 ENCOUNTER — Encounter: Payer: Self-pay | Admitting: Neurology

## 2024-10-28 ENCOUNTER — Ambulatory Visit: Admitting: Neurology

## 2024-10-28 VITALS — BP 129/78 | HR 84 | Ht 65.0 in | Wt 226.0 lb

## 2024-10-28 DIAGNOSIS — H02403 Unspecified ptosis of bilateral eyelids: Secondary | ICD-10-CM

## 2024-10-28 DIAGNOSIS — H538 Other visual disturbances: Secondary | ICD-10-CM

## 2024-10-28 NOTE — Progress Notes (Signed)
 [facility]  PATIENT: Kelly Haas DOB: [birth year]  REQUESTING CLINICIAN: Wilma Ozell BRAVO, MD HISTORY FROM: Patient  REASON FOR VISIT: Diplopia    HISTORICAL  CHIEF COMPLAINT:  Chief Complaint  Patient presents with   RM 12    Consult: droopy eyelids, concerned about MG; also seen for seizures; alone    HISTORY OF PRESENT ILLNESS:  Discussed the use of AI scribe software for clinical note transcription with the patient, who gave verbal consent to proceed.  History of Present Illness   Kelly Haas is a 53 year old female medical history including migraines, seizures, heart disease, anxiety and depression who presents with bilateral ptosis. She was referred by her ophthalmologist for evaluation of bilateral ptosis.  She has been experiencing bilateral ptosis for an unspecified duration, possibly several months.  She told me that she was not aware of it until her vision ophthalmology appointment when her doctor made comment about her eyes and she looked to her pictures and noticed the droopy eyelids.  She felt like it might be related to being tired but nothing else.  The drooping is noticeable at various times of the day, including upon waking and more prominently after lunch. She has been documenting the condition with photographs to track changes over time.  She also experiences diplopia, which has been significant enough to require her to stop activities such as watching TV and using the computer.  Denies any generalized weakness, no problems with breathing, denies any dyspnea on exertion.    OTHER MEDICAL CONDITIONS: Migraines, seizures, anxiety depression, heart disease   REVIEW OF SYSTEMS: Full 14 system review of systems performed and negative with exception of: As noted in the HPI   ALLERGIES: Allergies[1]  HOME MEDICATIONS: Outpatient Medications Prior to Visit  Medication Sig Dispense Refill   albuterol (VENTOLIN HFA) 108 (90 Base)  MCG/ACT inhaler Inhale 2 puffs into the lungs every 4 (four) hours as needed.     amLODipine (NORVASC) 5 MG tablet Take 5 mg by mouth daily.     busPIRone (BUSPAR) 30 MG tablet Take 30 mg by mouth 2 (two) times daily.     clonazePAM (KLONOPIN) 0.5 MG tablet Take 0.5 mg by mouth 2 (two) times daily as needed for anxiety.     cyclobenzaprine (FLEXERIL) 10 MG tablet Take 1 tablet (10 mg total) by mouth 3 (three) times daily as needed for Muscle spasms.  3   dicyclomine  (BENTYL ) 20 MG tablet Take 1 tablet (20 mg total) by mouth 4 (four) times daily -  before meals and at bedtime. 360 tablet 2   EPINEPHrine 0.3 mg/0.3 mL IJ SOAJ injection Inject 0.3 mg into the muscle as needed for anaphylaxis.     FLUoxetine (PROZAC) 20 MG capsule Take 20 mg by mouth daily.     fluticasone (FLONASE) 50 MCG/ACT nasal spray Place 2 sprays into both nostrils daily.     LamoTRIgine  200 MG TB24 24 hour tablet TAKE 2 TABLETS (400 MG TOTAL) BY MOUTH DAILY. 180 tablet 3   linaclotide  (LINZESS ) 145 MCG CAPS capsule Take 1 capsule (145 mcg total) by mouth daily before breakfast. 90 capsule 3   loratadine (CLARITIN) 10 MG tablet Take 10 mg by mouth daily.     lubiprostone  (AMITIZA ) 8 MCG capsule Take 1 capsule (8 mcg total) by mouth 2 (two) times daily with a meal. 180 capsule 0   meclizine (ANTIVERT) 25 MG tablet Take 25 mg by mouth as needed for dizziness or  nausea.     nitroGLYCERIN  (NITROSTAT ) 0.4 MG SL tablet Place 0.4 mg under the tongue every 5 (five) minutes as needed for chest pain.     ondansetron  (ZOFRAN -ODT) 8 MG disintegrating tablet Take 1 tablet (8 mg total) by mouth every 8 (eight) hours as needed for nausea or vomiting. 20 tablet 0   pantoprazole  (PROTONIX ) 40 MG tablet TAKE 1 TABLET EVERY DAY 90 tablet 3   pregabalin (LYRICA) 75 MG capsule Take 75 mg by mouth 2 (two) times daily.     rizatriptan  (MAXALT ) 10 MG tablet Take 1 tablet (10 mg total) by mouth as needed for migraine. 10 tablet 11   tamsulosin (FLOMAX)  0.4 MG CAPS capsule Take 0.4 mg by mouth as needed.     topiramate  (TOPAMAX ) 100 MG tablet Take 1.5 tablets (150 mg total) by mouth at bedtime. 135 tablet 3   traZODone (DESYREL) 100 MG tablet Take 200 mg by mouth at bedtime as needed.     XOLAIR 150 MG/ML prefilled syringe Inject 150 mg into the skin every 30 (thirty) days. 2 injections     hydrOXYzine (ATARAX) 50 MG tablet Take 50 mg by mouth at bedtime. (Patient not taking: Reported on 10/07/2024)     LYRICA 50 MG capsule Take 1 capsule by mouth 2 (two) times daily.  (Patient not taking: Reported on 10/07/2024)  5   traMADol (ULTRAM) 50 MG tablet Take 50 mg by mouth. (Patient not taking: Reported on 10/07/2024)     No facility-administered medications prior to visit.    PAST MEDICAL HISTORY: Past Medical History:  Diagnosis Date   Abnormal cortisol level 05/01/2023   Abnormal thyroid  function test 05/01/2023   Anxiety disorder    Atypical squamous cell changes of undetermined significance (ASCUS) on vaginal cytology with positive high risk human papilloma virus (HPV) 09/11/2020   Formatting of this note might be different from the original. Many years ago before hyst cryo for abnormal pap 2021 ASCUS pap with pos HR HPV. Colpo-possible lesion- appears excoriated at the R cuff. Brushing shows inflammation with slightly positive  Ki67 index.10 days of metrogel. Recheck 3 weeks. Repeat pap in 6 mos. 03/2021 Nl pap with pos HR HPV   Bursitis, trochanteric 04/15/2013   Cardiac murmur 04/19/2021   Common migraine with intractable migraine 06/05/2021   Contracture of tendon sheath 11/26/2018   Depression with anxiety    Drug therapy 02/02/2018   Epistaxis 03/13/2016   Essential hypertension 04/19/2021   Fibromyalgia    G-6-PD deficiency 03/13/2016   GERD (gastroesophageal reflux disease)    History of adrenal insufficiency 07/14/2023   History of underactive thyroid  08/27/2022   Hyperemesis gravidarum 03/13/2016   Hyperlipidemia     Hypertension    Insomnia    Kidney stone    Laryngeal trauma 07/23/2012   Long term current use of opiate analgesic 03/12/2013   Migraine    Multiple allergies 07/14/2023   Multiple joint pain 03/13/2016   Multiple myeloma (HCC)    Nontoxic multinodular goiter 08/27/2022   Obesity (BMI 35.0-39.9 without comorbidity) 04/19/2021   Onychomycosis due to dermatophyte 11/03/2018   Osteoarthritis of left hip 03/12/2013   Other reduced mobility 07/15/2023   Ovarian cyst 03/13/2016   Pain due to onychomycosis of toenail of left foot 02/19/2018   Pain syndrome, chronic 03/12/2013   Peripheral polyneuropathy 02/02/2018   Pharyngitis 03/13/2016   Positive ANA (antinuclear antibody) 03/13/2016   Renal insufficiency    Sacroiliac pain 03/12/2013   Secondary adrenal insufficiency  05/01/2023   Seizure disorder (HCC) 11/13/2017   Syncope and collapse 01/01/2016   Thoracic spondylosis 03/12/2013   Thyroid  disease    Urticaria    Vaginal bleeding 05/20/2019   Formatting of this note might be different from the original.  Prior hyst ~2005 for benign indications Bleeding 05/2019 Postcoital bleeding reported 08/2020   Vitamin D  insufficiency 05/01/2023   Voice hoarseness 02/02/2018    PAST SURGICAL HISTORY: Past Surgical History:  Procedure Laterality Date   ABDOMINAL HYSTERECTOMY     APPENDECTOMY     CHOLECYSTECTOMY     ESOPHAGOGASTRODUODENOSCOPY  08/29/2017   Mild gastritis. Stauts post empiric esophageal dilitation.    TOE SURGERY Left    removed toe nail   TONSILLECTOMY AND ADENOIDECTOMY      FAMILY HISTORY: Family History  Problem Relation Age of Onset   Cancer Mother    Diabetes Mother    Hypertension Mother    Kidney cancer Mother    Heart attack Father        79   Seizures Father    Thyroid  cancer Sister        Patient reported   Thyroid  cancer Maternal Aunt    Hypertension Maternal Grandmother    Lung cancer Maternal Grandmother    Stroke Maternal Grandmother    Heart  attack Maternal Grandmother    Hypertension Maternal Grandfather    Colon cancer Maternal Grandfather    Pancreatic cancer Maternal Grandfather    Prostate cancer Maternal Grandfather    Lung cancer Maternal Grandfather    Hypertension Paternal Grandmother    Hypertension Paternal Grandfather    Esophageal cancer Neg Hx    Stomach cancer Neg Hx    Rectal cancer Neg Hx     SOCIAL HISTORY: Social History   Socioeconomic History   Marital status: Single    Spouse name: Not on file   Number of children: 3   Years of education: 14   Highest education level: Associate degree: occupational, scientist, product/process development, or vocational program  Occupational History   Occupation: Disabled  Tobacco Use   Smoking status: Never   Smokeless tobacco: Never  Vaping Use   Vaping status: Never Used  Substance and Sexual Activity   Alcohol use: No   Drug use: No   Sexual activity: Not on file  Other Topics Concern   Not on file  Social History Narrative   Lives at home with daughter, son and grandson.   Right-handed.   No caffeine use.   Social Drivers of Health   Tobacco Use: Low Risk (10/28/2024)   Patient History    Smoking Tobacco Use: Never    Smokeless Tobacco Use: Never    Passive Exposure: Not on file  Financial Resource Strain: Not on file  Food Insecurity: Low Risk (07/15/2023)   Received from [facility]   Epic    Within the past 12 months, you worried that your food would run out before you got money to buy more: Never true    Within the past 12 months, the food you bought just didn't last and you didn't have money to get more. : Never true  Transportation Needs: No Transportation Needs (07/15/2023)   Received from [facility]    In the past 12 months, has lack of reliable transportation kept you from medical appointments, meetings, work or from getting things needed for daily living? : No  Physical Activity: Not on file  Stress: Not on file  Social Connections:  Not on file  Intimate Partner Violence: Not At Risk (10/10/2024)   Received from [facility]   HITS    Over the last 12 months how often did your partner physically hurt you?: Never    Over the last 12 months how often did your partner insult you or talk down to you?: Never    Over the last 12 months how often did your partner threaten you with physical harm?: Never    Over the last 12 months how often did your partner scream or curse at you?: Never  Depression (PHQ2-9): Not on file  Alcohol Screen: Not on file  Housing: Low Risk (07/15/2023)   Received from [facility]   Epic    What is your living situation today?: I have a steady place to live    Think about the place you live. Do you have problems with any of the following? Choose all that apply:: None/None on this list  Utilities: Low Risk (07/15/2023)   Received from [facility]   Utilities    In the past 12 months has the electric, gas, oil, or water company threatened to shut off services in your home? : No  Health Literacy: Not on file    PHYSICAL EXAM  GENERAL EXAM/CONSTITUTIONAL: Vitals:  Vitals:   10/28/24 1143  BP: 129/78  Pulse: 84  Weight: 226 lb (102.5 kg)  Height: 5' 5 (1.651 m)   Body mass index is 37.61 kg/m. Wt Readings from Last 3 Encounters:  10/28/24 226 lb (102.5 kg)  10/07/24 224 lb 8 oz (101.8 kg)  10/04/24 228 lb (103.4 kg)   Patient is in no distress; well developed, nourished and groomed; neck is supple  MUSCULOSKELETAL: Gait, strength, tone, movements noted in Neurologic exam below  NEUROLOGIC: MENTAL STATUS:      No data to display         awake, alert, oriented to person, place and time recent and remote memory intact normal attention and concentration language fluent, comprehension intact, naming intact fund of knowledge appropriate  CRANIAL NERVE:  2nd, 3rd, 4th, 6th - Visual fields full to confrontation, extraocular muscles intact, no nystagmus.  No ptosis noted on  exam 5th - facial sensation symmetric 7th - facial strength symmetric 8th - hearing intact 9th - palate elevates symmetrically, uvula midline 11th - shoulder shrug symmetric 12th - tongue protrusion midline  MOTOR:  normal bulk and tone, full strength in the BUE, BLE  SENSORY:  normal and symmetric to light touch  COORDINATION:  finger-nose-finger, fine finger movements normal  GAIT/STATION:  normal   DIAGNOSTIC DATA (LABS, IMAGING, TESTING) - I reviewed patient records, labs, notes, testing and imaging myself where available.  Lab Results  Component Value Date   WBC 6.5 06/28/2024   HGB 12.6 06/28/2024   HCT 38.3 06/28/2024   MCV 93.0 06/28/2024   PLT 309.0 06/28/2024      Component Value Date/Time   NA 143 06/28/2024 1438   NA 143 12/07/2020 1315   K 3.9 06/28/2024 1438   CL 111 06/28/2024 1438   CO2 26 06/28/2024 1438   GLUCOSE 106 (H) 06/28/2024 1438   BUN 9 06/28/2024 1438   BUN 11 12/07/2020 1315   CREATININE 0.96 06/28/2024 1438   CALCIUM 9.0 06/28/2024 1438   PROT 6.7 06/28/2024 1438   PROT 6.8 12/07/2020 1315   ALBUMIN 4.1 06/28/2024 1438   ALBUMIN 4.0 12/07/2020 1315   AST 10 06/28/2024 1438   ALT 9 06/28/2024 1438  ALKPHOS 81 06/28/2024 1438   BILITOT 0.4 06/28/2024 1438   BILITOT 0.3 12/07/2020 1315   GFRNONAA >60 02/04/2022 1304   GFRAA 75 12/07/2020 1315   No results found for: CHOL, HDL, LDLCALC, LDLDIRECT, TRIG, CHOLHDL No results found for: YHAJ8R No results found for: VITAMINB12 Lab Results  Component Value Date   TSH 0.48 05/24/2022     ASSESSMENT AND PLAN  53 y.o. year old female with    Suspected ocular myasthenia gravis Bilateral ptosis with symptoms more pronounced in the evening, suggestive of ocular myasthenia gravis. Symptoms include diplopia and ptosis, with no respiratory involvement.   - Ordered blood tests for myasthenia gravis antibodies (muscle-specific kinase antibody and acetylcholine receptor  antibody). - Advised to document eyelid position with photographs, especially in the evening. - If blood tests are positive, will initiate treatment with steroids and Mestinon.    1. Ptosis of both eyelids   2. Blurred vision      Patient Instructions  Will obtain Myasthenia gravis labs  Will call with updates  Follow up as schedule next year   Orders Placed This Encounter  Procedures   Acetylcholine receptor, binding   MuSK Antibodies    No orders of the defined types were placed in this encounter.   No follow-ups on file.    Pastor Falling, MD 10/28/2024, 12:16 PM  [facility] [address], [address] [place], [place] [address] [phone]     [1]  Allergies Allergen Reactions   Acetaminophen  Other (See Comments)    Pass out   Bee Venom Anaphylaxis   Iodinated Contrast Media Anaphylaxis   Methylcellulose Other (See Comments)    Pass out    Ibuprofen Hives, Nausea And Vomiting and Rash    Other reaction(s): Vomiting (intolerance)   Aminolevulinate Derivatives Other (See Comments)    unknown   Aspirin Nausea And Vomiting   Aspirin-Acetaminophen -Caffeine Other (See Comments)    unknown   Celebrex [Celecoxib] Hives and Rash   Dapsone Other (See Comments)    unknown   Dimercaprol Other (See Comments)    unknown   Latex Rash   Naproxen Hives    Hives   Nitrofuran Derivatives Other (See Comments)    unknown   Other Rash and Other (See Comments)    Artichoke- rash around lips, ticklish throat   Oxycodone  Rash   Phenazopyridine Other (See Comments)    unknown   Phenergan  [Promethazine  Hcl] Rash   Primaquine Other (See Comments)    unknown   Sulfa Antibiotics Nausea And Vomiting   Toluidine Blue Other (See Comments)    unknown

## 2024-10-28 NOTE — Patient Instructions (Signed)
 Will obtain Myasthenia gravis labs  Will call with updates  Follow up as schedule next year

## 2024-11-04 NOTE — Telephone Encounter (Signed)
 Referral recieeved Pt consult  for nevers in eye was 10-28-2024 , .  This has already been taking care of

## 2024-11-05 ENCOUNTER — Ambulatory Visit: Payer: Self-pay | Admitting: Neurology

## 2024-11-05 LAB — ACETYLCHOLINE RECEPTOR, BINDING: AChR Binding Ab, Serum: <0.07 nmol/L (ref 0.00–0.24)

## 2024-11-05 LAB — MUSK ANTIBODIES: MuSK Antibodies: <1 U/mL

## 2024-12-11 ENCOUNTER — Other Ambulatory Visit: Payer: Self-pay | Admitting: Gastroenterology

## 2024-12-11 DIAGNOSIS — K219 Gastro-esophageal reflux disease without esophagitis: Secondary | ICD-10-CM

## 2024-12-13 ENCOUNTER — Institutional Professional Consult (permissible substitution): Admitting: Neurology

## 2025-04-19 ENCOUNTER — Ambulatory Visit: Admitting: Neurology

## 2025-05-10 ENCOUNTER — Ambulatory Visit: Admitting: Neurology
